# Patient Record
Sex: Female | Born: 2000 | Hispanic: No | Marital: Single | Smoking: Never smoker
Health system: Southern US, Community
[De-identification: ages and names within clinical notes are randomized; demographics above are authoritative.]

## PROBLEM LIST (undated history)

## (undated) ENCOUNTER — Emergency Department (HOSPITAL_COMMUNITY): Admission: EM | Payer: Self-pay | Source: Home / Self Care

## (undated) DIAGNOSIS — D649 Anemia, unspecified: Secondary | ICD-10-CM

## (undated) DIAGNOSIS — E079 Disorder of thyroid, unspecified: Secondary | ICD-10-CM

## (undated) DIAGNOSIS — M1990 Unspecified osteoarthritis, unspecified site: Secondary | ICD-10-CM

## (undated) DIAGNOSIS — G510 Bell's palsy: Secondary | ICD-10-CM

## (undated) DIAGNOSIS — E039 Hypothyroidism, unspecified: Secondary | ICD-10-CM

## (undated) DIAGNOSIS — M069 Rheumatoid arthritis, unspecified: Secondary | ICD-10-CM

## (undated) HISTORY — DX: Bell's palsy: G51.0

## (undated) HISTORY — DX: Hypothyroidism, unspecified: E03.9

## (undated) HISTORY — DX: Unspecified osteoarthritis, unspecified site: M19.90

## (undated) HISTORY — DX: Anemia, unspecified: D64.9

## (undated) HISTORY — DX: Disorder of thyroid, unspecified: E07.9

---
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
## 2019-02-06 ENCOUNTER — Telehealth: Payer: Self-pay | Admitting: Internal Medicine

## 2019-02-06 ENCOUNTER — Other Ambulatory Visit (INDEPENDENT_AMBULATORY_CARE_PROVIDER_SITE_OTHER): Payer: Self-pay

## 2019-02-06 ENCOUNTER — Other Ambulatory Visit: Payer: Self-pay

## 2019-02-06 DIAGNOSIS — E039 Hypothyroidism, unspecified: Secondary | ICD-10-CM

## 2019-02-06 LAB — T4, FREE: Free T4: 1.38 ng/dL (ref 0.60–1.60)

## 2019-02-06 LAB — TSH: TSH: 5.88 u[IU]/mL — ABNORMAL HIGH (ref 0.40–5.00)

## 2019-02-06 MED ORDER — LEVOTHYROXINE SODIUM 75 MCG PO TABS: 75.0000 ug | ORAL_TABLET | Freq: Every day | ORAL | 3 refills | Status: DC

## 2019-02-06 NOTE — Telephone Encounter (Signed)
Please let her know her thyroid is off again, I have increased her levothyroxine from 50 to 75 mcg daily    Prescription should be at the pharmacy.     Thanks    Abby Nena Jordan, MD  [facility] Endocrinology  [facility] [address] [place], [place] [address] Phone: [phone] FAX: [phone]

## 2019-02-06 NOTE — Telephone Encounter (Signed)
Pt aware of results 

## 2019-06-12 ENCOUNTER — Ambulatory Visit: Payer: Self-pay | Admitting: Internal Medicine

## 2019-08-07 ENCOUNTER — Encounter: Payer: Self-pay | Admitting: Internal Medicine

## 2019-08-07 ENCOUNTER — Ambulatory Visit (INDEPENDENT_AMBULATORY_CARE_PROVIDER_SITE_OTHER): Payer: Self-pay | Admitting: Internal Medicine

## 2019-08-07 ENCOUNTER — Other Ambulatory Visit: Payer: Self-pay

## 2019-08-07 VITALS — BP 108/72 | HR 73 | Temp 98.7°F | Ht 64.0 in | Wt 122.6 lb

## 2019-08-07 DIAGNOSIS — L819 Disorder of pigmentation, unspecified: Secondary | ICD-10-CM

## 2019-08-07 DIAGNOSIS — E063 Autoimmune thyroiditis: Secondary | ICD-10-CM | POA: Insufficient documentation

## 2019-08-07 LAB — T4, FREE: Free T4: 1.01 ng/dL (ref 0.60–1.60)

## 2019-08-07 LAB — TSH: TSH: 1.3 u[IU]/mL (ref 0.35–5.50)

## 2019-08-07 NOTE — Patient Instructions (Signed)

## 2019-08-07 NOTE — Progress Notes (Signed)
Name: Jessica Frazier  MRN/ DOB: [id number], 01-Jul-2000    Age/ Sex: 19 y.o., female     PCP: Karel Jarvis, NP   Reason for Endocrinology Evaluation: Hypothyroidism     Initial Endocrinology Clinic Visit: 12/12/2019    PATIENT IDENTIFIER: Jessica Frazier is a 19 y.o., female with a past medical history of hypothyroidism and RA . She has followed with [facility] [facility] since 12/12/2019 for consultative assistance with management of her hypothyroidism.   HISTORICAL SUMMARY:  Pt was noted to have an elevated TSH at 9.7 uIU/mL during an evaluation for severe joint pains and aches in 11/2018 She was started on Lt-4 replacement at the time.   Mother with hyperthyroidism  SUBJECTIVE:    Today (08/07/2019):  Jessica Frazier is here for a follow up on hypothyroidism.  She has been compliant with LT-4 replacement.  She has noted weight loss Denies fatigue or constipation She was recently started on RINVOQ for RA through rheumatology by Dr. Marella Chimes    Has noted pruritic rash that was noted ~ 6 months ago, followed by hypopigmentation  No local neck symptoms       ROS:  As per HPI.   HISTORY:  Past Medical History:  Past Medical History:  Diagnosis Date  . Bell's palsy   . Hypothyroidism     Past Surgical History: No past surgical history on file.  Social History:  reports that she has never smoked. She has never used smokeless tobacco. She reports that she does not use drugs. No history on file for alcohol. Family History:  Family History  Problem Relation Age of Onset  . Graves' disease Mother   . Healthy Father      HOME MEDICATIONS: Allergies as of 08/07/2019   No Known Allergies     Medication List       Accurate as of Aug 07, 2019  2:10 PM. If you have any questions, ask your nurse or doctor.        amoxicillin 500 MG capsule Commonly known as: AMOXIL TAKE 1 CAPUSLE THREE TIMES DAILY.   ibuprofen 600 MG tablet Commonly known as:  ADVIL Take 600 mg by mouth 2 (two) times daily as needed.   levothyroxine 75 MCG tablet Commonly known as: SYNTHROID Take 1 tablet (75 mcg total) by mouth daily.   predniSONE 10 MG tablet Commonly known as: DELTASONE PLEASE SEE ATTACHED FOR DETAILED DIRECTIONS   Rinvoq 15 MG Tb24 Generic drug: Upadacitinib ER Take by mouth.   valACYclovir 500 MG tablet Commonly known as: VALTREX TAKE 1 TABLET BY MOUTH TWICE A DAY FOR 5 DAYS         OBJECTIVE:   PHYSICAL EXAM: VS: BP 108/72 (BP Location: Left Arm, Patient Position: Sitting, Cuff Size: Normal)   Pulse 73   Temp 98.7 F (37.1 C)   Ht 5\' 4"  (1.626 m)   Wt 122 lb 9.6 oz (55.6 kg)   LMP 07/06/2019 (Exact Date)   SpO2 98%   BMI 21.04 kg/m    EXAM: General: Pt appears well and is in NAD  Neck: General: Supple without adenopathy. Thyroid: Thyroid size is prominent.   Lungs: Clear with good BS bilat with no rales, rhonchi, or wheezes  Heart: Auscultation: RRR.  Abdomen: Normoactive bowel sounds, soft, nontender, without masses or organomegaly palpable  Extremities:  BL LE: No pretibial edema normal ROM and strength.  Mental Status: Judgment, insight: Intact Orientation: Oriented to time, place, and person Mood and affect:  No depression, anxiety, or agitation     DATA REVIEWED: Results for LUWAM, CURRIE (MRN [id number]) as of 08/08/2019 07:10  Ref. Range 08/07/2019 14:20  TSH Latest Ref Range: 0.35 - 5.50 uIU/mL 1.30  T4,Free(Direct) Latest Ref Range: 0.60 - 1.60 ng/dL 1.01      ASSESSMENT / PLAN / RECOMMENDATIONS:   1. 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 Tinea Versicolor:   - I have advised her to consult dermatology - Reassurance provided at this time     F/U in 6 months   Signed electronically by: Mack Guise,  MD  [facility] Endocrinology  [facility] [address] [place], [place] [address] Phone: [phone] FAX: [phone]      CC: Karel Jarvis, NP [address] [place] [address] Phone: [phone]  Fax: [phone]   Return to Endocrinology clinic as below: No future appointments.

## 2019-08-08 ENCOUNTER — Encounter: Payer: Self-pay | Admitting: Internal Medicine

## 2019-08-08 MED ORDER — LEVOTHYROXINE SODIUM 75 MCG PO TABS: 75.0000 ug | ORAL_TABLET | Freq: Every day | ORAL | 3 refills | Status: DC

## 2020-02-04 ENCOUNTER — Encounter: Payer: Self-pay | Admitting: Internal Medicine

## 2020-02-04 ENCOUNTER — Other Ambulatory Visit: Payer: Self-pay

## 2020-02-04 ENCOUNTER — Ambulatory Visit (INDEPENDENT_AMBULATORY_CARE_PROVIDER_SITE_OTHER): Payer: Self-pay | Admitting: Internal Medicine

## 2020-02-04 VITALS — BP 106/70 | HR 80 | Ht 64.0 in | Wt 123.5 lb

## 2020-02-04 DIAGNOSIS — E063 Autoimmune thyroiditis: Secondary | ICD-10-CM

## 2020-02-04 LAB — TSH: TSH: 1.47 u[IU]/mL (ref 0.35–5.50)

## 2020-02-04 NOTE — Progress Notes (Signed)
Name: Jessica Frazier  MRN/ DOB: [id number], [birth year]    Age/ Sex: 19 y.o., female     PCP: Karel Jarvis, NP   Reason for Endocrinology Evaluation: Hypothyroidism     Initial Endocrinology Clinic Visit: 12/12/2019    PATIENT IDENTIFIER: [name] is a 19 y.o., female with a past medical history of hypothyroidism and RA . She has followed with [facility] since 12/12/2019 for consultative assistance with management of her hypothyroidism.   HISTORICAL SUMMARY:  Pt was noted to have an elevated TSH at 9.7 uIU/mL during an evaluation for severe joint pains and aches in 11/2018 She was started on Lt-4 replacement at the time.   Mother with hyperthyroidism  SUBJECTIVE:    Today (02/04/2020):  [name] is here for a follow up on hypothyroidism.  She has been compliant with LT-4 replacement.  Weight has been stable  Denies fatigue or constipation Denies depression    Denies local neck symptoms    She is on  RINVOQ for RA through rheumatology by Dr. Marella Chimes     HISTORY:  Past Medical History:  Past Medical History:  Diagnosis Date  . Bell's palsy   . Hypothyroidism     Past Surgical History: No past surgical history on file.  Social History:  reports that she has never smoked. She has never used smokeless tobacco. She reports that she does not use drugs. No history on file for alcohol use. Family History:  Family History  Problem Relation Age of Onset  . Graves' disease Mother   . Healthy Father      HOME MEDICATIONS: Allergies as of 02/04/2020   No Known Allergies     Medication List       Accurate as of February 04, 2020  1:23 PM. If you have any questions, ask your nurse or doctor.        STOP taking these medications   amoxicillin 500 MG capsule Commonly known as: AMOXIL Stopped by: Dorita Sciara, MD   predniSONE 10 MG tablet Commonly known as: DELTASONE Stopped by: Dorita Sciara, MD     TAKE these  medications   ibuprofen 600 MG tablet Commonly known as: ADVIL Take 600 mg by mouth 2 (two) times daily as needed.   levothyroxine 75 MCG tablet Commonly known as: SYNTHROID Take 1 tablet (75 mcg total) by mouth daily.   Rinvoq 15 MG Tb24 Generic drug: Upadacitinib ER Take by mouth.   valACYclovir 500 MG tablet Commonly known as: VALTREX TAKE 1 TABLET BY MOUTH TWICE A DAY FOR 5 DAYS         OBJECTIVE:   PHYSICAL EXAM: VS: BP 106/70   Pulse 80   Ht 5\' 4"  (1.626 m)   Wt 123 lb 8 oz (56 kg)   LMP 01/30/2020   SpO2 97%   BMI 21.20 kg/m    EXAM: General: Pt appears well and is in NAD  Neck: General: Supple without adenopathy. Thyroid: Thyroid size is prominent.   Lungs: Clear with good BS bilat with no rales, rhonchi, or wheezes  Heart: Auscultation: RRR.  Abdomen: Normoactive bowel sounds, soft, nontender, without masses or organomegaly palpable  Extremities:  BL LE: No pretibial edema normal ROM and strength.  Mental Status: Judgment, insight: Intact Orientation: Oriented to time, place, and person Mood and affect: No depression, anxiety, or agitation     DATA REVIEWED: Results for TOMA, ERICHSEN (MRN [id number]) as of 02/05/2020 09:29  Ref. Range 02/04/2020  13:34  TSH Latest Ref Range: 0.35 - 5.50 uIU/mL 1.47     ASSESSMENT / PLAN / RECOMMENDATIONS:   1. Hypothyroidism Secondary to Hashimoto's Thyroiditis :  - Pt is clinically euthyroid  - No local neck symptoms - Repeat labs today show normal TSH   Medications   Continue Levothyroxine 75 mcg daily      F/U in 1 yr   Signed electronically by: Mack Guise, MD  [facility] Endocrin[facility]  [facility] [address] [place], [place] [address] Phone: [phone] FAX: [phone]      CC: Karel Jarvis, NP [place] [address] Phone: [phone]  Fax: [phone]   Return to Endocrinology clinic as below: No future  appointments.

## 2020-02-04 NOTE — Patient Instructions (Signed)
-   Continue Levothyroxine 75 mcg daily

## 2020-02-05 ENCOUNTER — Encounter: Payer: Self-pay | Admitting: Internal Medicine

## 2020-02-08 ENCOUNTER — Ambulatory Visit: Payer: Self-pay | Admitting: Internal Medicine

## 2020-04-09 ENCOUNTER — Encounter (HOSPITAL_COMMUNITY): Payer: Self-pay | Admitting: Emergency Medicine

## 2020-04-09 ENCOUNTER — Other Ambulatory Visit: Payer: Self-pay

## 2020-04-09 ENCOUNTER — Observation Stay (HOSPITAL_COMMUNITY)
Admission: EM | Admit: 2020-04-09 | Discharge: 2020-04-10 | Disposition: A | Discharge status: Home / Self Care | Payer: Self-pay | Attending: Family Medicine | Admitting: Family Medicine

## 2020-04-09 ENCOUNTER — Emergency Department (HOSPITAL_COMMUNITY): Payer: Self-pay

## 2020-04-09 DIAGNOSIS — R945 Abnormal results of liver function studies: Principal | ICD-10-CM | POA: Insufficient documentation

## 2020-04-09 DIAGNOSIS — D61818 Other pancytopenia: Secondary | ICD-10-CM

## 2020-04-09 DIAGNOSIS — Z20822 Contact with and (suspected) exposure to covid-19: Secondary | ICD-10-CM | POA: Insufficient documentation

## 2020-04-09 DIAGNOSIS — R7401 Elevation of levels of liver transaminase levels: Secondary | ICD-10-CM | POA: Insufficient documentation

## 2020-04-09 DIAGNOSIS — R112 Nausea with vomiting, unspecified: Secondary | ICD-10-CM

## 2020-04-09 DIAGNOSIS — R7989 Other specified abnormal findings of blood chemistry: Secondary | ICD-10-CM

## 2020-04-09 DIAGNOSIS — E039 Hypothyroidism, unspecified: Secondary | ICD-10-CM | POA: Insufficient documentation

## 2020-04-09 DIAGNOSIS — Z79899 Other long term (current) drug therapy: Secondary | ICD-10-CM | POA: Insufficient documentation

## 2020-04-09 DIAGNOSIS — R1011 Right upper quadrant pain: Secondary | ICD-10-CM

## 2020-04-09 DIAGNOSIS — M069 Rheumatoid arthritis, unspecified: Secondary | ICD-10-CM

## 2020-04-09 HISTORY — DX: Rheumatoid arthritis, unspecified: M06.9

## 2020-04-09 LAB — COMPREHENSIVE METABOLIC PANEL
ALT: 284 U/L — ABNORMAL HIGH (ref 0–44)
AST: 1069 U/L — ABNORMAL HIGH (ref 15–41)
Albumin: 2.5 g/dL — ABNORMAL LOW (ref 3.5–5.0)
Alkaline Phosphatase: 185 U/L — ABNORMAL HIGH (ref 38–126)
Anion gap: 9 (ref 5–15)
BUN: <5 mg/dL — ABNORMAL LOW (ref 6–20)
CO2: 25 mmol/L (ref 22–32)
Calcium: 8.1 mg/dL — ABNORMAL LOW (ref 8.9–10.3)
Chloride: 105 mmol/L (ref 98–111)
Creatinine, Ser: 0.54 mg/dL (ref 0.44–1.00)
GFR, Estimated: >60 mL/min (ref >60)
Glucose, Bld: 99 mg/dL (ref 70–99)
Potassium: 3.8 mmol/L (ref 3.5–5.1)
Sodium: 139 mmol/L (ref 135–145)
Total Bilirubin: 1.3 mg/dL — ABNORMAL HIGH (ref 0.3–1.2)
Total Protein: 7.1 g/dL (ref 6.5–8.1)

## 2020-04-09 LAB — I-STAT BETA HCG BLOOD, ED (MC, WL, AP ONLY): I-stat hCG, quantitative: <5 m[IU]/mL (ref <5)

## 2020-04-09 LAB — PROTIME-INR
INR: 1.1 (ref 0.8–1.2)
Prothrombin Time: 13.3 seconds (ref 11.4–15.2)

## 2020-04-09 LAB — HEPATITIS PANEL, ACUTE
HCV Ab: NONREACTIVE
Hep A IgM: NONREACTIVE
Hep B C IgM: NONREACTIVE
Hepatitis B Surface Ag: NONREACTIVE

## 2020-04-09 LAB — SARS CORONAVIRUS 2 (TAT 6-24 HRS): SARS Coronavirus 2: NEGATIVE

## 2020-04-09 LAB — HIV ANTIBODY (ROUTINE TESTING W REFLEX): HIV Screen 4th Generation wRfx: NONREACTIVE

## 2020-04-09 LAB — CBC
HCT: 33.4 % — ABNORMAL LOW (ref 36.0–46.0)
Hemoglobin: 10.1 g/dL — ABNORMAL LOW (ref 12.0–15.0)
MCH: 28.1 pg (ref 26.0–34.0)
MCHC: 30.2 g/dL (ref 30.0–36.0)
MCV: 92.8 fL (ref 80.0–100.0)
Platelets: 94 10*3/uL — ABNORMAL LOW (ref 150–400)
RBC: 3.6 MIL/uL — ABNORMAL LOW (ref 3.87–5.11)
RDW: 18.7 % — ABNORMAL HIGH (ref 11.5–15.5)
WBC: 2.3 10*3/uL — ABNORMAL LOW (ref 4.0–10.5)
nRBC: 0 % (ref 0.0–0.2)

## 2020-04-09 LAB — ACETAMINOPHEN LEVEL: Acetaminophen (Tylenol), Serum: <10 ug/mL — ABNORMAL LOW (ref 10–30)

## 2020-04-09 LAB — ETHANOL: Alcohol, Ethyl (B): <10 mg/dL (ref <10)

## 2020-04-09 LAB — APTT: aPTT: 38 seconds — ABNORMAL HIGH (ref 24–36)

## 2020-04-09 LAB — HEPATITIS B SURFACE ANTIGEN: Hepatitis B Surface Ag: NONREACTIVE

## 2020-04-09 LAB — LIPASE, BLOOD: Lipase: 40 U/L (ref 11–51)

## 2020-04-09 MED ORDER — ACETAMINOPHEN 650 MG RE SUPP: 650.0000 mg | Freq: Four times a day (QID) | RECTAL | Status: DC | PRN

## 2020-04-09 MED ORDER — MORPHINE SULFATE (PF) 4 MG/ML IV SOLN: 4.0000 mg | Freq: Once | INTRAVENOUS | Status: DC

## 2020-04-09 MED ORDER — MORPHINE SULFATE (PF) 2 MG/ML IV SOLN
2.0000 mg | Freq: Once | INTRAVENOUS | Status: AC
  Administered 2020-04-09: 2 mg via INTRAVENOUS
  Filled 2020-04-09: qty 1

## 2020-04-09 MED ORDER — ACETAMINOPHEN 325 MG PO TABS: 650.0000 mg | ORAL_TABLET | Freq: Four times a day (QID) | ORAL | Status: DC | PRN

## 2020-04-09 MED ORDER — ENOXAPARIN SODIUM 40 MG/0.4ML ~~LOC~~ SOLN
40.0000 mg | SUBCUTANEOUS | Status: DC
  Filled 2020-04-09 (×2): qty 0.4

## 2020-04-09 MED ORDER — SODIUM CHLORIDE 0.9 % IV BOLUS
500.0000 mL | Freq: Once | INTRAVENOUS | Status: AC
  Administered 2020-04-09: 500 mL via INTRAVENOUS

## 2020-04-09 MED ORDER — FENTANYL CITRATE (PF) 100 MCG/2ML IJ SOLN
50.0000 ug | Freq: Once | INTRAMUSCULAR | Status: AC
  Administered 2020-04-09: 50 ug via INTRAVENOUS
  Filled 2020-04-09: qty 2

## 2020-04-09 MED ORDER — LEVOTHYROXINE SODIUM 75 MCG PO TABS
75.0000 ug | ORAL_TABLET | Freq: Every day | ORAL | Status: DC
  Administered 2020-04-09 – 2020-04-10 (×2): 75 ug via ORAL
  Filled 2020-04-09 (×2): qty 1

## 2020-04-09 NOTE — H&P (Signed)
[facility] Admission History and Physical Service Pager: [phone]  Patient name: Jessica Frazier Medical record number: [id number] Date of birth: 2001/01/14 Age: 20 y.o. Gender: female  Primary Care Provider: Karel Jarvis, NP Consultants: None Code Status: Full Preferred Emergency Contact: Mother [name]  Chief Complaint: Elevated transaminases  Assessment and Plan: Jessica Frazier is a 20 y.o. female presenting with elevated LFTs. PMH is significant for rheumatoid arthritis, hypothyroidism.  Elevated Transaminases  RUQ pain Sent over from Rheumatologists Office after noted elevated LFTs.  Patient's home medications do include Rinvoq which does have an adverse effect of elevated LFTs.  Per report the patient had seen her rheumatologist recently and was recommended to stop that medication due to mildly elevated LFTs, however she apparently continued with it.  She was seen in her rheumatologist office a few days ago and had additional blood work drawn which showed elevated transaminases and so she was sent to the emergency department once this lab resulted.  In the emergency department patient had an AST elevated at 1069, ALT of 284, total bilirubin mildly elevated 1.3.  Right upper quadrant ultrasound showed coarse echogenic liver suggesting fatty liver disease and possible trace perihepatic ascites but was otherwise normal. Patient does endorse some mild right upper quadrant discomfort to palpation.  Low concern for gallbladder etiology with the right upper quadrant ultrasound findings and bilirubin essentially within normal limits.  She also had a hCG that was negative, no concern for ectopic pregnancy causing her abdominal pain. Overall differential for the elevated transaminases can include her rheumatoid medication which does have this as a potential adverse effect versus acetaminophen overdose, alcohol, other toxins, viral infections including hepatitis A, B,  C. -Admit to FPTS, MedSurg, attending Dr. Gwendlyn Deutscher -A.m. CBC/CMP -Check for HIV -We will check acetaminophen level, ethanol level -Hepatitis panel including hepatitis B surface antigen -We will check PT/INR and PTT to evaluate for liver function -Can consider oxycodone 5 mg for pain, though patient does not endorse much discomfort at this time  Pancytopenia: Patient with white blood cell count decreased at 2.3, hemoglobin 10.1, platelets of 94 on admission today.  Per talking to her rheumatology office patient had previous labs drawn in December on the 28th which showed white blood cell count of 3.3, hemoglobin of 10, platelets within normal limits at 307.  She also had previous labs in June 2021 which showed white blood cell count barely normal limits at 4.3, hemoglobin barely below normal at 11.4, and platelets within normal limits.  Per the adverse effects of the rinvoq, anemia and lymphopenia can occur.  I do not find any mention of thrombocytopenia as a potential adverse effect for this medication upon review.  Other differentials can include myelodysplastic syndrome, aplastic anemia, hypersplenism, certain infections such as HIV, hepatitis, parvo B19 infection.  On physical exam the patient does not have a enlarged spleen, she does not endorse recent symptoms of cough, fever, but does endorse some shortness of breath over the recent time.  No obvious signs of a recent viral respiratory infection. -A.m. CBC with differential -Viral hepatitis panel as above -We will check HIV  Hypothyroidism: Last TSH 02/04/2020 within normal is at 1.47.  Home medications include Synthroid 75 mcg/day. - Continue home Synthroid.  Rheumatoid arthritis Home medications include Upadacitinib ER 15mg  daily.  Per her rheumatology office this medication was discontinued at her office visit 1-2 days ago. -Continue to hold  FEN/GI: Regular Prophylaxis: Lovenox  Disposition: Admit to MedSurg  History of  Present  Illness:  Jessica Frazier is a 20 y.o. female presenting after receiving a call from her rheumatology office that she should go and be evaluated due to greatly elevated transaminases.  Patient has a history of rheumatoid arthritis and has been taking a medication called Rinvoq about a year.  Per the report she had previously been recommended to stop this medication after getting a mild bump in her AST/ALT, however she continued taking this and after having a visit at her rheumatologist office 1 to 2 days ago and having labs drawn she received a phone call that these labs had resulted and her AST was greatly elevated.  She was recommended to go to the emergency department to be evaluated.  She did complain of some right upper quadrant abdominal pain and had an ultrasound in the emergency department that did not show any obvious signs of gallbladder etiology for the cause of her symptoms.  She had stopped taking the rheumatoid arthritis medication as of yesterday.  Her only other medication is Synthroid which she takes for hypothyroidism.  She denies any tobacco use, only drinks alcohol on rare occasion such as holidays, and does not use any illicit drugs.  Review Of Systems: Per HPI with the following additions:   Review of Systems  Constitutional: Negative for chills and fever.  Respiratory: Positive for shortness of breath (occasional).   Cardiovascular: Negative for chest pain.  Gastrointestinal: Positive for abdominal pain. Negative for constipation and diarrhea.  Genitourinary: Negative for menstrual problem.  Neurological: Positive for headaches.     Patient Active Problem List   Diagnosis Date Noted  . Hashimoto's thyroiditis 08/07/2019  . Discoloration of skin 08/07/2019  . 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 Acquired hypothyroidism 12/12/2018    Past Medical History: Past Medical History:  Diagnosis Date  . Bell's palsy   . Hypothyroidism   . Rheumatoid arthritis ([place])     Past Surgical  History: History reviewed. No pertinent surgical history.  Social History: Social History   Tobacco Use  . Smoking status: Never Smoker  . Smokeless tobacco: Never Used  Substance Use Topics  . Drug use: Never   Additional social history:   Please also refer to relevant sections of EMR.  Family History: Family History  Problem Relation Age of Onset  . Graves' disease Mother   . Healthy Father    Allergies and Medications: No Known Allergies No current facility-administered medications on file prior to encounter.   Current Outpatient Medications on File Prior to Encounter  Medication Sig Dispense Refill  . ibuprofen (ADVIL) 600 MG tablet Take 600 mg by mouth 2 (two) times daily as needed.    Marland Kitchen levothyroxine (SYNTHROID) 75 MCG tablet Take 1 tablet (75 mcg total) by mouth daily. 90 tablet 3  . Upadacitinib ER (RINVOQ) 15 MG TB24 Take by mouth.    . valACYclovir (VALTREX) 500 MG tablet TAKE 1 TABLET BY MOUTH TWICE A DAY FOR 5 DAYS (Patient not taking: Reported on 02/04/2020)      Objective: BP 101/66 (BP Location: Left Arm)   Pulse 95   Temp 98.6 F (37 C) (Oral)   Resp 14   Ht 5\' 2"  (1.575 m)   Wt 52.2 kg   SpO2 99%   BMI 21.03 kg/m  Exam: General: Alert and oriented, no apparent distress  Eyes: PERRLA, no scleral icterus Cardiovascular: RRR with no murmurs noted Respiratory: CTA bilaterally  Gastrointestinal: Bowel sounds present.  Mild abdominal discomfort to palpation of the right upper  quadrant, negative Murphy sign, no obvious hepatosplenomegaly on physical exam, no pain with palpation of the left upper quadrant MSK: Upper extremity strength 5/5 bilaterally, Lower extremity strength 5/5 bilaterally  Derm: No rashes noted Neuro: No obvious focal deficits Psych: Behavior and speech appropriate to situation  Labs and Imaging: CBC BMET  Recent Labs  Lab 04/09/20 0856  WBC 2.3*  HGB 10.1*  HCT 33.4*  PLT 94*   Recent Labs  Lab 04/09/20 0856  NA 139  K  3.8  CL 105  CO2 25  BUN <5*  CREATININE 0.54  GLUCOSE 99  CALCIUM 8.1Lurline Del, DO 04/09/2020, 1:34 PM PGY-2, [place] Intern pager: [phone], text pages welcome

## 2020-04-09 NOTE — ED Provider Notes (Signed)
[place] EMERGENCY DEPARTMENT Provider Note   CSN: [id number] Arrival date & time: 04/09/20  0840     History Chief Complaint  Patient presents with  . Abnormal Lab    Jessica Frazier is a 20 y.o. female history Bell palsy, hypothyroidism, rheumatoid arthritis.  Patient arrives today sent in by her rheumatologist for evaluation of elevated AST.  Patient reports that she currently takes Rinvoq and they have been monitoring her LFTs, she has had increasing elevation of the past month as well as right upper quadrant pain nausea and vomiting for the past few days.  She denies similar problem in the past.  She is in for further evaluation.  She reports abdominal pain as an aching constant nonradiating pain no aggravating or alleviating factors moderate intensity.  Denies fever/chills, chest pain/shortness of breath, cough/hemoptysis, dysuria/hematuria, vaginal bleeding/discharge or any additional concerns HPI     Past Medical History:  Diagnosis Date  . Bell's palsy   . Hypothyroidism   . Rheumatoid arthritis [facility])     Patient Active Problem List   Diagnosis Date Noted  . Elevated LFTs 04/09/2020  . Hashimoto's thyroiditis 08/07/2019  . Discoloration of skin 08/07/2019  . 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 Acquired hypothyroidism 12/12/2018    History reviewed. No pertinent surgical history.   OB History   No obstetric history on file.     Family History  Problem Relation Age of Onset  . Graves' disease Mother   . Healthy Father     Social History   Tobacco Use  . Smoking status: Never Smoker  . Smokeless tobacco: Never Used  Substance Use Topics  . Drug use: Never    Home Medications Prior to Admission medications   Medication Sig Start Date End Date Taking? Authorizing Provider  ibuprofen (ADVIL) 600 MG tablet Take 600 mg by mouth 2 (two) times daily as needed. 11/23/18   [provider]  levothyroxine (SYNTHROID) 75 MCG tablet Take 1  tablet (75 mcg total) by mouth daily. 08/08/19   Shamleffer, Melanie Crazier, MD  Upadacitinib ER (RINVOQ) 15 MG TB24 Take by mouth.    [provider]  valACYclovir (VALTREX) 500 MG tablet TAKE 1 TABLET BY MOUTH TWICE A DAY FOR 5 DAYS Patient not taking: Reported on 02/04/2020 08/28/18   [provider]    Allergies    Patient has no known allergies.  Review of Systems   Review of Systems Ten systems are reviewed and are negative for acute change except as noted in the HPI  Physical Exam Updated Vital Signs BP 101/66 (BP Location: Left Arm)   Pulse 95   Temp 98.6 F (37 C) (Oral)   Resp 14   Ht 5' 2"  (1.575 m)   Wt 52.2 kg   SpO2 99%   BMI 21.03 kg/m   Physical Exam Constitutional:      General: She is not in acute distress.    Appearance: Normal appearance. She is well-developed. She is not ill-appearing or diaphoretic.  HENT:     Head: Normocephalic and atraumatic.  Eyes:     General: Vision grossly intact. Gaze aligned appropriately.     Pupils: Pupils are equal, round, and reactive to light.  Neck:     Trachea: Trachea and phonation normal.  Pulmonary:     Effort: Pulmonary effort is normal. No respiratory distress.  Abdominal:     General: There is no distension.     Palpations: Abdomen is soft.  Tenderness: There is abdominal tenderness in the right upper quadrant. There is no guarding or rebound. Negative signs include Murphy's sign and McBurney's sign.  Musculoskeletal:        General: Normal range of motion.     Cervical back: Normal range of motion.  Skin:    General: Skin is warm and dry.  Neurological:     Mental Status: She is alert.     GCS: GCS eye subscore is 4. GCS verbal subscore is 5. GCS motor subscore is 6.     Comments: Speech is clear and goal oriented, follows commands Major Cranial nerves without deficit, no facial droop Moves extremities without ataxia, coordination intact  Psychiatric:        Behavior: Behavior  normal.     ED Results / Procedures / Treatments   Labs (all labs ordered are listed, but only abnormal results are displayed) Labs Reviewed  COMPREHENSIVE METABOLIC PANEL - Abnormal; Notable for the following components:      Result Value   BUN <5 (*)    Calcium 8.1 (*)    Albumin 2.5 (*)    AST 1,069 (*)    ALT 284 (*)    Alkaline Phosphatase 185 (*)    Total Bilirubin 1.3 (*)    All other components within normal limits  CBC - Abnormal; Notable for the following components:   WBC 2.3 (*)    RBC 3.60 (*)    Hemoglobin 10.1 (*)    HCT 33.4 (*)    RDW 18.7 (*)    Platelets 94 (*)    All other components within normal limits  SARS CORONAVIRUS 2 (TAT 6-24 HRS)  LIPASE, BLOOD  URINALYSIS, ROUTINE W REFLEX MICROSCOPIC  I-STAT BETA HCG BLOOD, ED ([facility], [facility], [facility] ONLY)    EKG None  Radiology US Abdomen Limited RUQ (LIVER/GB)  Result Date: 04/09/2020 CLINICAL DATA:  20 year old female with right upper quadrant pain for 2 days, abnormal LFTs. EXAM: ULTRASOUND ABDOMEN LIMITED RIGHT UPPER QUADRANT COMPARISON:  None. FINDINGS: Gallbladder: No gallstones or wall thickening visualized. No sonographic Murphy sign noted by sonographer. Common bile duct: Diameter: 3 mm, normal. Liver: Coarse hepatic echotexture (image 25), pronounced increased echogenicity relative to the right kidney (image 44). No discrete liver lesion. No intrahepatic biliary ductal dilatation identified. Portal vein is patent on color Doppler imaging with normal direction of blood flow towards the liver. Other: Negative visible right kidney. Trace perihepatic free fluid is possible (image 39). IMPRESSION: 1. Coarse, echogenic liver suggesting Fatty Liver disease. Possible trace perihepatic ascites. 2. But otherwise negative right upper quadrant ultrasound: Negative gallbladder and no evidence of bile duct obstruction. Electronically Signed   By: Genevie Ann M.D.   On: 04/09/2020 11:18    Procedures Procedures   Medications  Ordered in ED Medications  sodium chloride 0.9 % bolus 500 mL (0 mLs Intravenous Stopped 04/09/20 1252)  fentaNYL (SUBLIMAZE) injection 50 mcg (50 mcg Intravenous Given 04/09/20 1026)  morphine 2 MG/ML injection 2 mg (2 mg Intravenous Given 04/09/20 1320)    ED Course  I have reviewed the triage vital signs and the nursing notes.  Pertinent labs & imaging results that were available during my care of the patient were reviewed by me and considered in my medical decision making (see chart for details).  Clinical Course as of 04/09/20 1416  Wed Apr 09, 2020  [facility] [BM]  3500 Prednisone 5-10 mg a day [BM]  1340 Dr. Manus Rudd [BM]  Clinical Course User Index [BM] Gari Crown   MDM Rules/Calculators/A&P                         Additional history obtained from: 1. Nursing notes from this visit. 2. Patient's Rheumatology office. ----------------------------------- I ordered, reviewed and interpreted labs which include: CBC shows pancytopenia CMP shows significant elevation of AST, additionally elevation of ALT and alk phos.  No emergent electrolyte derangement AKI or gap Lipase normal limits. Pregnancy test negative.  RUQ [place]:  IMPRESSION:  1. Coarse, echogenic liver suggesting Fatty Liver disease. Possible  trace perihepatic ascites.  2. But otherwise negative right upper quadrant ultrasound: Negative  gallbladder and no evidence of bile duct obstruction.  - Case discussed with patient's rheumatology provider Marella Chimes, PA-C.  Advises that significant elevation of AST is likely not due to Rinvoq as only mild elevations of 15% are seen with this medication.  They are concerned for other underlying pathologies.  They recommend patient stop taking Rinvoq and ask for medicine admission for further evaluation.  Advised patient begin taking prednisone 5-10 mg a day for treatment of rheumatoid arthritis in the meantime. - Patient reassessed some  improvement of pain after medication given in the ER.  She states understanding of findings and is agreeable to admission.  She has no additional complaints or concerns. - 1:40 PM: Consult with family medicine physician Dr. Manus Rudd, patient was accepted to medicine service.    Note: Portions of this report may have been transcribed using voice recognition software. Every effort was made to ensure accuracy; however, inadvertent computerized transcription errors may still be present. Final Clinical Impression(s) / ED Diagnoses Final diagnoses:  RUQ abdominal pain  LFT elevation  Pancytopenia (HCC)  Non-intractable vomiting with nausea, unspecified vomiting type  Rheumatoid arthritis, involving unspecified site, unspecified whether rheumatoid factor present [facility])    Rx / DC Orders ED Discharge Orders    None       Gari Crown 04/09/20 1417    Blanchie Dessert, MD 04/12/20 [phone]

## 2020-04-09 NOTE — ED Triage Notes (Signed)
Patient here for abnormal lab value, AST increased by approximately 1000 points from December to January. History of rheumatoid arthritis. Denies other complaints at this time.

## 2020-04-10 DIAGNOSIS — M069 Rheumatoid arthritis, unspecified: Secondary | ICD-10-CM

## 2020-04-10 LAB — CBC WITH DIFFERENTIAL/PLATELET
Abs Immature Granulocytes: 0.05 10*3/uL (ref 0.00–0.07)
Basophils Absolute: 0 10*3/uL (ref 0.0–0.1)
Basophils Relative: 0 %
Eosinophils Absolute: 0 10*3/uL (ref 0.0–0.5)
Eosinophils Relative: 0 %
HCT: 29.9 % — ABNORMAL LOW (ref 36.0–46.0)
Hemoglobin: 9.6 g/dL — ABNORMAL LOW (ref 12.0–15.0)
Immature Granulocytes: 2 %
Lymphocytes Relative: 16 %
Lymphs Abs: 0.4 10*3/uL — ABNORMAL LOW (ref 0.7–4.0)
MCH: 29.2 pg (ref 26.0–34.0)
MCHC: 32.1 g/dL (ref 30.0–36.0)
MCV: 90.9 fL (ref 80.0–100.0)
Monocytes Absolute: 0.1 10*3/uL (ref 0.1–1.0)
Monocytes Relative: 5 %
Neutro Abs: 2.1 10*3/uL (ref 1.7–7.7)
Neutrophils Relative %: 77 %
Platelets: 84 10*3/uL — ABNORMAL LOW (ref 150–400)
RBC: 3.29 MIL/uL — ABNORMAL LOW (ref 3.87–5.11)
RDW: 18.7 % — ABNORMAL HIGH (ref 11.5–15.5)
WBC: 2.7 10*3/uL — ABNORMAL LOW (ref 4.0–10.5)
nRBC: 0 % (ref 0.0–0.2)

## 2020-04-10 LAB — COMPREHENSIVE METABOLIC PANEL
ALT: 270 U/L — ABNORMAL HIGH (ref 0–44)
AST: 1004 U/L — ABNORMAL HIGH (ref 15–41)
Albumin: 2.6 g/dL — ABNORMAL LOW (ref 3.5–5.0)
Alkaline Phosphatase: 170 U/L — ABNORMAL HIGH (ref 38–126)
Anion gap: 8 (ref 5–15)
BUN: <5 mg/dL — ABNORMAL LOW (ref 6–20)
CO2: 25 mmol/L (ref 22–32)
Calcium: 8.1 mg/dL — ABNORMAL LOW (ref 8.9–10.3)
Chloride: 105 mmol/L (ref 98–111)
Creatinine, Ser: 0.61 mg/dL (ref 0.44–1.00)
GFR, Estimated: >60 mL/min (ref >60)
Glucose, Bld: 100 mg/dL — ABNORMAL HIGH (ref 70–99)
Potassium: 3.5 mmol/L (ref 3.5–5.1)
Sodium: 138 mmol/L (ref 135–145)
Total Bilirubin: 1.3 mg/dL — ABNORMAL HIGH (ref 0.3–1.2)
Total Protein: 7.1 g/dL (ref 6.5–8.1)

## 2020-04-10 LAB — MONONUCLEOSIS SCREEN: Mono Screen: NEGATIVE

## 2020-04-10 MED ORDER — ONDANSETRON 4 MG PO TBDP
4.0000 mg | ORAL_TABLET | Freq: Three times a day (TID) | ORAL | Status: DC | PRN
  Administered 2020-04-10: 4 mg via ORAL
  Filled 2020-04-10: qty 1

## 2020-04-10 MED ORDER — ONDANSETRON 4 MG PO TBDP: 4.0000 mg | ORAL_TABLET | Freq: Three times a day (TID) | ORAL | 0 refills | Status: AC | PRN

## 2020-04-10 NOTE — Discharge Instructions (Signed)
Dear Jessica Frazier,   Thank you for letting us participate in your care! In this section, you will find a brief hospital admission summary of why you were admitted to the hospital, what happened during your admission, your diagnosis/diagnoses, and recommended follow up.   You were admitted because you were experiencing elevated liver enzymes.   We believe this was due to you Jessica Frazier  You were treated with stopping your Jessica Frazier.   You were also tested for Hepatitis and other possible liver problems and we did not find any other causes.  At the time of your discharge we still had at least one lab pending which was looking for mono, when you follow-up with your primary care doctor if that results should be back.   POST-HOSPITAL & CARE INSTRUCTIONS 1. Please follow-up with your PCP Dr Michail Jewels next week and have her check your Liver enzymes.   2. Please let PCP/Specialists know of any changes in medications that were made.  3. Please see medications section of this packet for any medication changes.   DOCTOR'S APPOINTMENTS & FOLLOW UP Future Appointments  Date Time Provider [place]  02/02/2021  1:20 PM Shamleffer, Melanie Crazier, MD [facility] None     Thank you for choosing [facility]! Take care and be well!  [facility]  [place], [place] [address] [phone]

## 2020-04-10 NOTE — Hospital Course (Signed)
Jessica Frazier is a 20 y.o. female presenting with elevated LFTs. PMH is significant for rheumatoid arthritis, hypothyroidism.  Elevated Transaminases  RUQ pain Sent over from Rheumatology office given elevated Transaminases with AST over 1000.  On Admission AST was 1069 and ALT was 284.  Alk Phos also slightly elevated at 185.  Also had mild upper right quadrant pain with negative Murphy's sign.  Patient got significant lab work-up.  Hepatitis Panel , hCG, HIV, Monospot, Ethanol and Acetaminophen were all negative.  PT and aPTT were also normal.  RUQ was also obtained and showed liver inflammation, and some biliary sludge but no stone, blockage or cholecystitis.  Believe elevations were due to Rinvoq medication which patient had been taking for Rheumatoid Arthritis.  Stopped medication and did not restart at discharge.  LFT's slightly decreased at time of discharge and felt patient could be safely monitored outpatient.

## 2020-04-10 NOTE — Discharge Summary (Signed)
[facility] Discharge Summary  Patient name: Jessica Frazier Medical record number: [id number] Date of birth: 2001-03-01 Age: 20 y.o. Gender: female Date of Admission: 04/09/2020  Date of Discharge: 04/10/20 Admitting Physician: Kinnie Feil, MD  Primary Care Provider: Karel Jarvis, NP Consultants: None  Indication for Hospitalization: Elevated LFT's  Discharge Diagnoses/Problem List:  Rheumatoid Arthritis, Hypothyroidism  Disposition: Able to be discharged home safely  Discharge Condition: Stable  Discharge Exam:   Physical Exam Constitutional:      General: She is not in acute distress.    Appearance: She is not ill-appearing.  HENT:     Head: Normocephalic and atraumatic.     Mouth/Throat:     Mouth: Mucous membranes are moist.  Cardiovascular:     Rate and Rhythm: Normal rate and regular rhythm.     Pulses: Normal pulses.  Pulmonary:     Effort: Pulmonary effort is normal.     Breath sounds: Normal breath sounds.  Abdominal:     General: Abdomen is flat. Bowel sounds are normal. There is no distension.     Palpations: Abdomen is soft.     Tenderness: There is abdominal tenderness in the right upper quadrant. There is no guarding or rebound. Negative signs include Murphy's sign.  Neurological:     Mental Status: She is alert.     Brief Hospital Course:  Jessica Frazier is a 20 y.o. female presenting with elevated LFTs. PMH is significant for rheumatoid arthritis, hypothyroidism.  Elevated Transaminases  RUQ pain Sent over from Rheumatology office given elevated Transaminases with AST over 1000.  On Admission AST was 1069 and ALT was 284.  Alk Phos also slightly elevated at 185.  Also had mild upper right quadrant pain with negative Murphy's sign.  Patient got significant lab work-up.  Hepatitis Panel , hCG, HIV, Monospot, Ethanol and Acetaminophen were all negative.  PT and aPTT were also normal.  RUQ was also obtained and showed  liver inflammation, and some biliary sludge but no stone, blockage or cholecystitis.  Believe elevations were due to Rinvoq medication which patient had been taking for Rheumatoid Arthritis.  Stopped medication and did not restart at discharge.  LFT's slightly decreased at time of discharge and felt patient could be safely monitored outpatient.    Issues for Follow Up:  1. Obtain Liver function tests to see if patient's Transaminases have improved, can also obtain CBC to see if patient had improvement in mild pancytopenia. 2. Recommend work-up for patient's chronic nausea and vomiting. 3. Consider starting new medication for management of patient's RA.  Significant Procedures: None  Significant Labs and Imaging:  Recent Labs  Lab 04/09/20 0856 04/10/20 0149  WBC 2.3* 2.7*  HGB 10.1* 9.6*  HCT 33.4* 29.9*  PLT 94* 84*   Recent Labs  Lab 04/09/20 0856 04/10/20 0149  NA 139 138  K 3.8 3.5  CL 105 105  CO2 25 25  GLUCOSE 99 100*  BUN <5* <5*  CREATININE 0.54 0.61  CALCIUM 8.1* 8.1*  ALKPHOS 185* 170*  AST 1,069* 1,004*  ALT 284* 270*  ALBUMIN 2.5* 2.6*     Results/Tests Pending at Time of Discharge:  None  Discharge Medications:  Allergies as of 04/10/2020   No Known Allergies     Medication List    TAKE these medications   levothyroxine 75 MCG tablet Commonly known as: SYNTHROID Take 1 tablet (75 mcg total) by mouth daily.   ondansetron 4 MG disintegrating tablet Commonly  known as: ZOFRAN-ODT Take 1 tablet (4 mg total) by mouth every 8 (eight) hours as needed for up to 3 days for nausea or vomiting.       Discharge Instructions: Please refer to Patient Instructions section of EMR for full details.  Patient was counseled important signs and symptoms that should prompt return to medical care, changes in medications, dietary instructions, activity restrictions, and follow up appointments.   Follow-Up Appointments:  Follow-up Information    Karel Jarvis,  NP Follow up.   Specialty: Nurse Practitioner Contact information: [address] [place] [address] [phone]               Delora Fuel, MD 04/10/2020, 10:26 PM PGY-1, [place]

## 2020-04-10 NOTE — Progress Notes (Signed)
FAMILY MEDICINE TEACHING SERVICE Patient - Please contact intern pager [phone] (via phone or AMION, login: [email or web address]) for questions regarding care. Text pages welcome. DO NOT page or secure chat the listed attending provider unless there is no answer from the number above.

## 2020-04-10 NOTE — Progress Notes (Signed)
Discharge teaching complete. Meds, diet,activity, follow up appointments reviewed and all questions answered. Copy of instructions given to patient and prescription sent to pharmacy.  

## 2020-05-05 ENCOUNTER — Encounter (HOSPITAL_COMMUNITY): Payer: Self-pay | Admitting: Emergency Medicine

## 2020-05-05 ENCOUNTER — Other Ambulatory Visit: Payer: Self-pay

## 2020-05-05 ENCOUNTER — Emergency Department (HOSPITAL_COMMUNITY)
Admission: EM | Admit: 2020-05-05 | Discharge: 2020-05-05 | Disposition: A | Discharge status: Home / Self Care | Payer: Self-pay | Attending: Emergency Medicine | Admitting: Emergency Medicine

## 2020-05-05 ENCOUNTER — Emergency Department (HOSPITAL_COMMUNITY): Payer: Self-pay

## 2020-05-05 DIAGNOSIS — R109 Unspecified abdominal pain: Secondary | ICD-10-CM

## 2020-05-05 DIAGNOSIS — E039 Hypothyroidism, unspecified: Secondary | ICD-10-CM | POA: Insufficient documentation

## 2020-05-05 DIAGNOSIS — R112 Nausea with vomiting, unspecified: Secondary | ICD-10-CM | POA: Insufficient documentation

## 2020-05-05 DIAGNOSIS — R197 Diarrhea, unspecified: Secondary | ICD-10-CM | POA: Insufficient documentation

## 2020-05-05 DIAGNOSIS — Z79899 Other long term (current) drug therapy: Secondary | ICD-10-CM | POA: Insufficient documentation

## 2020-05-05 DIAGNOSIS — R1011 Right upper quadrant pain: Secondary | ICD-10-CM | POA: Insufficient documentation

## 2020-05-05 LAB — URINALYSIS, ROUTINE W REFLEX MICROSCOPIC
Bacteria, UA: NONE SEEN
Bilirubin Urine: NEGATIVE
Glucose, UA: NEGATIVE mg/dL
Hgb urine dipstick: NEGATIVE
Ketones, ur: NEGATIVE mg/dL
Leukocytes,Ua: NEGATIVE
Nitrite: NEGATIVE
Protein, ur: NEGATIVE mg/dL
Specific Gravity, Urine: 1.02 (ref 1.005–1.030)
pH: 6 (ref 5.0–8.0)

## 2020-05-05 LAB — COMPREHENSIVE METABOLIC PANEL
ALT: 47 U/L — ABNORMAL HIGH (ref 0–44)
AST: 221 U/L — ABNORMAL HIGH (ref 15–41)
Albumin: 2.1 g/dL — ABNORMAL LOW (ref 3.5–5.0)
Alkaline Phosphatase: 176 U/L — ABNORMAL HIGH (ref 38–126)
Anion gap: 9 (ref 5–15)
BUN: 5 mg/dL — ABNORMAL LOW (ref 6–20)
CO2: 22 mmol/L (ref 22–32)
Calcium: 7.5 mg/dL — ABNORMAL LOW (ref 8.9–10.3)
Chloride: 106 mmol/L (ref 98–111)
Creatinine, Ser: 0.53 mg/dL (ref 0.44–1.00)
GFR, Estimated: >60 mL/min (ref >60)
Glucose, Bld: 84 mg/dL (ref 70–99)
Potassium: 3.7 mmol/L (ref 3.5–5.1)
Sodium: 137 mmol/L (ref 135–145)
Total Bilirubin: 0.7 mg/dL (ref 0.3–1.2)
Total Protein: 6.4 g/dL — ABNORMAL LOW (ref 6.5–8.1)

## 2020-05-05 LAB — CBC
HCT: 33.6 % — ABNORMAL LOW (ref 36.0–46.0)
Hemoglobin: 10.4 g/dL — ABNORMAL LOW (ref 12.0–15.0)
MCH: 29.6 pg (ref 26.0–34.0)
MCHC: 31 g/dL (ref 30.0–36.0)
MCV: 95.7 fL (ref 80.0–100.0)
Platelets: 77 10*3/uL — ABNORMAL LOW (ref 150–400)
RBC: 3.51 MIL/uL — ABNORMAL LOW (ref 3.87–5.11)
RDW: 19 % — ABNORMAL HIGH (ref 11.5–15.5)
WBC: 2.5 10*3/uL — ABNORMAL LOW (ref 4.0–10.5)
nRBC: 0.8 % — ABNORMAL HIGH (ref 0.0–0.2)

## 2020-05-05 LAB — LIPASE, BLOOD: Lipase: 33 U/L (ref 11–51)

## 2020-05-05 LAB — I-STAT BETA HCG BLOOD, ED (MC, WL, AP ONLY): I-stat hCG, quantitative: <5 m[IU]/mL (ref <5)

## 2020-05-05 MED ORDER — FENTANYL CITRATE (PF) 100 MCG/2ML IJ SOLN
50.0000 ug | Freq: Once | INTRAMUSCULAR | Status: AC
  Administered 2020-05-05: 50 ug via INTRAVENOUS
  Filled 2020-05-05: qty 2

## 2020-05-05 MED ORDER — DICYCLOMINE HCL 20 MG PO TABS: 20.0000 mg | ORAL_TABLET | Freq: Two times a day (BID) | ORAL | 0 refills | Status: DC

## 2020-05-05 MED ORDER — ONDANSETRON HCL 4 MG/2ML IJ SOLN
4.0000 mg | Freq: Once | INTRAMUSCULAR | Status: AC
  Administered 2020-05-05: 4 mg via INTRAVENOUS
  Filled 2020-05-05: qty 2

## 2020-05-05 MED ORDER — SODIUM CHLORIDE 0.9 % IV BOLUS
1000.0000 mL | Freq: Once | INTRAVENOUS | Status: AC
  Administered 2020-05-05: 1000 mL via INTRAVENOUS

## 2020-05-05 NOTE — ED Provider Notes (Signed)
[facility] EMERGENCY DEPARTMENT Provider Note   CSN: [id number] Arrival date & time: 05/05/20  1202     History Chief Complaint  Patient presents with  . Abdominal Pain    Jessica Frazier is a 20 y.o. female with PMH/o RA, hypothyroidism who presents for evaluation of right upper quadrant abdominal pain that began yesterday.  She describes it as a dull pain time.  She states that she has had nausea and vomiting about a week ago prior to onset of pain.  She states that since last night, the pain has been coughing improvement but states that it was continued to persist morning, prompting ED visit.  He states that heat makes it feel better.  She states that it is worse whenever she moves, coughs or sneeze.  She was able to eat some soup last night she felt that her pain is worse.  She has also had some diarrhea that has been ongoing since yesterday.  No blood noted in stool.  She reports occasional alcohol use holidays but otherwise no frequent alcohol use.  She does not smoke and denies any drug use.  She has not any fevers, chest pain, difficulty breathing, dysuria, hematuria.  She was admitted in January 2022 for transaminitis that they thought was due to her RA medication renvoq.  At that time, she had an unremarkable work-up and she was discharged home.  She states she is followed up with her artery doctor since then he states that her LFTs work still slightly elevated but were improving.  She denies any history of IV drug use, frequent Tylenol use.    The history is provided by the patient.       Past Medical History:  Diagnosis Date  . Bell's palsy   . Hypothyroidism   . Rheumatoid arthritis [facility])     Patient Active Problem List   Diagnosis Date Noted  . Elevated LFTs 04/09/2020  . Hashimoto's thyroiditis 08/07/2019  . Discoloration of skin 08/07/2019  . 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 Acquired hypothyroidism 12/12/2018    History reviewed. No pertinent surgical  history.   OB History   No obstetric history on file.     Family History  Problem Relation Age of Onset  . Graves' disease Mother   . Healthy Father     Social History   Tobacco Use  . Smoking status: Never Smoker  . Smokeless tobacco: Never Used  Substance Use Topics  . Drug use: Never    Home Medications Prior to Admission medications   Medication Sig Start Date End Date Taking? Authorizing Provider  dicyclomine (BENTYL) 20 MG tablet Take 1 tablet (20 mg total) by mouth 2 (two) times daily for 5 days. 05/05/20 05/10/20 Yes Volanda Napoleon, PA-C  levothyroxine (SYNTHROID) 75 MCG tablet Take 1 tablet (75 mcg total) by mouth daily. 08/08/19  Yes Shamleffer, Melanie Crazier, MD  ondansetron (ZOFRAN-ODT) 4 MG disintegrating tablet Take 4-8 mg by mouth every 6 (six) hours as needed for nausea or vomiting (DISSOLVE ORALLY). sat 04/15/20  Yes [provider]    Allergies    Rinvoq [upadacitinib]  Review of Systems   Review of Systems  Constitutional: Negative for fever.  Respiratory: Negative for cough and shortness of breath.   Cardiovascular: Negative for chest pain.  Gastrointestinal: Positive for abdominal pain. Negative for nausea and vomiting.  Genitourinary: Negative for dysuria and hematuria.  Neurological: Negative for headaches.  All other systems reviewed and are negative.   Physical Exam  Updated Vital Signs BP 111/73 (BP Location: Right Arm)   Pulse 98   Temp 99.5 F (37.5 C) (Oral)   Resp 16   Ht _0  (1.575 m)   Wt 47.6 kg   SpO2 100%   BMI 19.20 kg/m   Physical Exam Vitals and nursing note reviewed.  Constitutional:      Appearance: Normal appearance. She is well-developed and well-nourished.  HENT:     Head: Normocephalic and atraumatic.     Mouth/Throat:     Mouth: Oropharynx is clear and moist and mucous membranes are normal.  Eyes:     General: Lids are normal.     Extraocular Movements: EOM normal.     Conjunctiva/sclera:  Conjunctivae normal.     Pupils: Pupils are equal, round, and reactive to light.  Cardiovascular:     Rate and Rhythm: Normal rate and regular rhythm.     Pulses: Normal pulses.     Heart sounds: Normal heart sounds. No murmur heard. No friction rub. No gallop.   Pulmonary:     Effort: Pulmonary effort is normal.     Breath sounds: Normal breath sounds.     Comments: Lungs clear to auscultation bilaterally.  Symmetric chest rise.  No wheezing, rales, rhonchi. Abdominal:     Palpations: Abdomen is soft. Abdomen is not rigid.     Tenderness: There is abdominal tenderness in the right upper quadrant. There is no guarding. Positive signs include Murphy's sign.     Comments: Tenderness palpation of the right upper quadrant.  No rigidity, guarding.  No focal tenderness of McBurney's point.  Positive Murphy sign. No CVA tenderness noted bilaterally.   Musculoskeletal:        General: Normal range of motion.     Cervical back: Full passive range of motion without pain.  Skin:    General: Skin is warm and dry.     Capillary Refill: Capillary refill takes less than 2 seconds.  Neurological:     Mental Status: She is alert and oriented to person, place, and time.  Psychiatric:        Mood and Affect: Mood and affect normal.        Speech: Speech normal.     ED Results / Procedures / Treatments   Labs (all labs ordered are listed, but only abnormal results are displayed) Labs Reviewed  COMPREHENSIVE METABOLIC PANEL - Abnormal; Notable for the following components:      Result Value   BUN 5 (*)    Calcium 7.5 (*)    Total Protein 6.4 (*)    Albumin 2.1 (*)    AST 221 (*)    ALT 47 (*)    Alkaline Phosphatase 176 (*)    All other components within normal limits  CBC - Abnormal; Notable for the following components:   WBC 2.5 (*)    RBC 3.51 (*)    Hemoglobin 10.4 (*)    HCT 33.6 (*)    RDW 19.0 (*)    Platelets 77 (*)    nRBC 0.8 (*)    All other components within normal limits   URINALYSIS, ROUTINE W REFLEX MICROSCOPIC - Abnormal; Notable for the following components:   Color, Urine AMBER (*)    All other components within normal limits  LIPASE, BLOOD  I-STAT BETA HCG BLOOD, ED ([facility], [facility], [facility] ONLY)    EKG None  Radiology US Abdomen Limited RUQ (LIVER/GB)  Result Date: 05/05/2020 CLINICAL DATA:  Right upper quadrant  pain and vomiting EXAM: ULTRASOUND ABDOMEN LIMITED RIGHT UPPER QUADRANT COMPARISON:  04/09/2020 FINDINGS: Gallbladder: No gallstones or wall thickening visualized. No sonographic Murphy sign noted by sonographer. Common bile duct: Diameter: 2 mm Liver: No focal lesion. Diffusely increased parenchymal echogenicity. Portal vein is patent on color Doppler imaging with normal direction of blood flow towards the liver. Other: None. IMPRESSION: Diffuse increased echogenicity of the hepatic parenchyma is a nonspecific indicator of hepatocellular dysfunction, most commonly steatosis. Electronically Signed   By: Miachel Roux M.D.   On: 05/05/2020 16:17    Procedures Procedures   Medications Ordered in ED Medications  sodium chloride 0.9 % bolus 1,000 mL (0 mLs Intravenous Stopped 05/05/20 1815)  sodium chloride 0.9 % bolus 1,000 mL (0 mLs Intravenous Stopped 05/05/20 1815)  ondansetron (ZOFRAN) injection 4 mg (4 mg Intravenous Given 05/05/20 1638)  fentaNYL (SUBLIMAZE) injection 50 mcg (50 mcg Intravenous Given 05/05/20 1638)    ED Course  I have reviewed the triage vital signs and the nursing notes.  Pertinent labs & imaging results that were available during my care of the patient were reviewed by me and considered in my medical decision making (see chart for details).  Clinical Course as of 05/05/20 2005  Mon May 05, 2020  1518 Alkaline Phosphatase(!): 176 [LA]    Clinical Course User Index [LA] Leary Roca   MDM Rules/Calculators/A&P                          20 year old female who presents for evaluation of right upper quadrant  abdominal pain that began yesterday.  She states she has had some associated diarrhea.  She does report that prior to pain, she had some nausea/vomiting but was able to eat soup last night without causing any significant pain.  She has a history of transaminitis that they think was from her RA medication about a month ago.  She reports she is followed with her rheumatologist and noted that her LFTs were still slightly high but had gone down.  She denies any fevers, chest pain, difficulty breathing.  Pain is worsened with movement, coughing, sneezing.  It is better with heat.  On initial arrival, she is afebrile, toxic appearing.  Vital signs are stable.  She is slightly tachycardic.  Likely secondary to pain.  Vitals otherwise stable.  On exam, she has tenderness noted to the right upper quadrant.  No rigidity, guarding.  No CVA tenderness.  We will plan for labs.  UA negative for any infectious etiology.  I-STAT beta is negative.  Lipase unremarkable.  CMP shows AST of 221, ALT of 47, alk phos of 176.  This is an improvement from her previous labs that were seen during her admission last month.  I do not have any priors after her discharge I do not know if she went back to normal and then started getting elevated again but she tells me that her RA doctor did notice some elevations.  Given that she is having tenderness, elevations, we will plan for repeat ultrasound.  At this time, do not feel that she needs further work-up as she has had negative hepatitis panel, ethanol, mono, Tylenol level.  Ultrasound shows diffuse increased echogenicity of the hepatic parenchyma is nonspecific.  Could be related to hepatic steatosis.  Reevaluation.  Patient's vitals improved after pain medication. She is no longer tachycardia. Patient reports improvement in pain.  Repeat abdominal exam shows improvement tenderness.  At this time,  unclear etiology of her symptoms but feel she is stable for discharge home.  We discussed  that this could be a viral GI process given her diarrhea.  She has not followed up with a GI doctor.  Given her continued elevation in her LFTs, feel that this is reasonable. At this time, patient exhibits no emergent life-threatening condition that require further evaluation in ED. Discussed patient with Dr. Darl Householder. Patient had ample opportunity for questions and discussion. All patient's questions were answered with full understanding. Strict return precautions discussed. Patient expresses understanding and agreement to plan.   Portions of this note were generated with [employer]. Dictation errors may occur despite best attempts at proofreading.  Final Clinical Impression(s) / ED Diagnoses Final diagnoses:  Abdominal pain    Rx / DC Orders ED Discharge Orders         Ordered    dicyclomine (BENTYL) 20 MG tablet  2 times daily        05/05/20 1751           Desma Mcgregor 05/05/20 2006    Drenda Freeze, MD 05/05/20 2220

## 2020-05-05 NOTE — ED Triage Notes (Signed)
Pt arrives to ED with chief complaint of RUQ pain she states she recently had an enlarged liver due to a medication she was on.

## 2020-05-05 NOTE — ED Notes (Signed)
To [place] 1600

## 2020-05-05 NOTE — Medical Student Note (Addendum)
[facility] DEPT Provider Student Note For educational purposes for Medical, PA and NP students only and not part of the legal medical record.   CSN: [id number] Arrival date & time: 05/05/20  1202  History   Chief Complaint Chief Complaint  Patient presents with  . Abdominal Pain    HPI Jessica Frazier is a 20 y.o. female with PMH of RA and hypothyroidism who presents to ED for abdominal pain.  States she was doing well until yesterday when she began having "bad pain" in the morning. States the RUQ pain is at worst 8/10. Describes it as dull and constant. Worse when she increases abdominal pressure with coughing/sneezing/taking a deep breath. Is alleviated by heat. She took one dose of tylenol last night which she states alleviated some pain but with continued pain this morning came to the ED. She endorses nausea and vomiting multiple times since last week. Denies she was having abdominal pain at that time. Denies hematemesis. Endorses diarrhea 2-3 times since yesterday. Light brown stools without hematochezia or melena. Denies eating aggravating symptoms. Denies fevers or bloating. Denies noticing any scleral icterus or jaundice. Endorses recreational alcohol use on holidays only. Denies smoking or drug use.   She had recent admission for transaminitis. Her work-up was negative for acute hepatitis, HIV, RPR, toxins. Suspected Rinvoq medication for her RA. They discontinued her medication with continued follow up with rheumatology. She has been off medication for 1.5 months.   Past Medical History:  Diagnosis Date  . Bell's palsy   . Hypothyroidism   . Rheumatoid arthritis [facility])     Patient Active Problem List   Diagnosis Date Noted  . Elevated LFTs 04/09/2020  . Hashimoto's thyroiditis 08/07/2019  . Discoloration of skin 08/07/2019  . 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 Acquired hypothyroidism 12/12/2018   History reviewed. No pertinent surgical history.  OB History   No obstetric  history on file.    Home Medications    Prior to Admission medications   Medication Sig Start Date End Date Taking? Authorizing Provider  levothyroxine (SYNTHROID) 75 MCG tablet Take 1 tablet (75 mcg total) by mouth daily. 08/08/19   Shamleffer, Melanie Crazier, MD   Family History Family History  Problem Relation Age of Onset  . Graves' disease Mother   . Healthy Father    Social History Social History   Tobacco Use  . Smoking status: Never Smoker  . Smokeless tobacco: Never Used  Substance Use Topics  . Drug use: Never   Allergies   Patient has no known allergies.  Review of Systems Review of Systems  Constitutional: Positive for appetite change, chills and fatigue. Negative for diaphoresis and fever.  HENT: Negative.   Eyes: Negative.   Respiratory: Positive for cough.   Cardiovascular: Negative.   Gastrointestinal: Positive for abdominal pain, diarrhea, nausea and vomiting. Negative for blood in stool.  Endocrine: Negative.   Genitourinary: Negative.   Musculoskeletal: Negative.   Skin: Negative.   Allergic/Immunologic: Negative.   Neurological: Negative.   Hematological: Negative.  Does not bruise/bleed easily.  Psychiatric/Behavioral: Negative.    Physical Exam Updated Vital Signs BP 107/60 (BP Location: Right Arm)   Pulse (!) 124   Temp 99.9 F (37.7 C) (Oral)   Resp 16   SpO2 100%   Physical Exam Vitals and nursing note reviewed.  Constitutional:      General: She is not in acute distress. HENT:     Head: Normocephalic.  Eyes:     General: No scleral icterus.  Extraocular Movements: Extraocular movements intact.     Pupils: Pupils are equal, round, and reactive to light.  Cardiovascular:     Rate and Rhythm: Regular rhythm. Tachycardia present.     Heart sounds: Normal heart sounds.  Pulmonary:     Effort: Pulmonary effort is normal.     Breath sounds: Normal breath sounds.  Abdominal:     General: Abdomen is flat. Bowel sounds are  decreased. There is no distension.     Palpations: Abdomen is soft.     Tenderness: There is abdominal tenderness in the right upper quadrant, right lower quadrant and epigastric area. There is no right CVA tenderness, left CVA tenderness or rebound. Positive signs include Murphy's sign.  Skin:    General: Skin is warm and dry.     Capillary Refill: Capillary refill takes less than 2 seconds.     Coloration: Skin is not jaundiced.  Neurological:     General: No focal deficit present.     Mental Status: She is alert.    ED Treatments / Results  Labs (all labs ordered are listed, but only abnormal results are displayed) Labs Reviewed  COMPREHENSIVE METABOLIC PANEL - Abnormal; Notable for the following components:      Result Value   BUN 5 (*)    Calcium 7.5 (*)    Total Protein 6.4 (*)    Albumin 2.1 (*)    AST 221 (*)    ALT 47 (*)    Alkaline Phosphatase 176 (*)    All other components within normal limits  CBC - Abnormal; Notable for the following components:   WBC 2.5 (*)    RBC 3.51 (*)    Hemoglobin 10.4 (*)    HCT 33.6 (*)    RDW 19.0 (*)    Platelets 77 (*)    nRBC 0.8 (*)    All other components within normal limits  URINALYSIS, ROUTINE W REFLEX MICROSCOPIC - Abnormal; Notable for the following components:   Color, Urine AMBER (*)    All other components within normal limits  LIPASE, BLOOD  I-STAT BETA HCG BLOOD, ED ([facility], [facility], [facility] ONLY)   EKG  Radiology US Abdomen Limited RUQ (LIVER/GB)  Result Date: 05/05/2020 CLINICAL DATA:  Right upper quadrant pain and vomiting EXAM: ULTRASOUND ABDOMEN LIMITED RIGHT UPPER QUADRANT COMPARISON:  04/09/2020 FINDINGS: Gallbladder: No gallstones or wall thickening visualized. No sonographic Murphy sign noted by sonographer. Common bile duct: Diameter: 2 mm Liver: No focal lesion. Diffusely increased parenchymal echogenicity. Portal vein is patent on color Doppler imaging with normal direction of blood flow towards the liver. Other:  None. IMPRESSION: Diffuse increased echogenicity of the hepatic parenchyma is a nonspecific indicator of hepatocellular dysfunction, most commonly steatosis. Electronically Signed   By: Miachel Roux M.D.   On: 05/05/2020 16:17    Procedures Procedures (including critical care time)  Medications Ordered in ED Medications  sodium chloride 0.9 % bolus 1,000 mL (1,000 mLs Intravenous New Bag/Given 05/05/20 1642)  sodium chloride 0.9 % bolus 1,000 mL (1,000 mLs Intravenous New Bag/Given 05/05/20 1636)  ondansetron (ZOFRAN) injection 4 mg (4 mg Intravenous Given 05/05/20 1638)  fentaNYL (SUBLIMAZE) injection 50 mcg (50 mcg Intravenous Given 05/05/20 1638)   Initial Impression / Assessment and Plan / ED Course  I have reviewed the triage vital signs and the nursing notes.  Pertinent labs & imaging results that were available during my care of the patient were reviewed by me and considered in my medical decision making (  see chart for details).  Halie Gass is a 60yoF with PMH of RA and hypothyroidism who presents for HPI as stated above. Differential diagnosis includes acute on chronic hepatitis, cholecystitis, pancreatitis, ascending UTI.  Unlikely pancreatitis. Lipase is 33. No LUQ or back pain.  UA obtained to r/o UTI which was negative. Obtained RUQ ultrasound showing diffuse echogenicity of the hepatic parenchyma.   Abdominal Pain  - Pain is likely due to chronic hepatitis picture. She had rheumatology appointment on 04/22/20 and states her rheumatologist stated her LFTs were improving but does not sound like they ever returned to normal. Because of this I do not believe current transaminitis is due to an acute inflammation.  - avoid hepatotoxins  - continue to avoid Rinvoq  Pancytopenia - appears chronic. Was pancytopenic on admission in January. - Likely anemia of chronic disease as there is no acute bleed present and history of RA. - platelets decreased d/t liver dysfunction. No  petechiae present. No signs of bleeding.   Tachycardia - likely dehydration from vomiting and diarrhea. - replaced with 2L IVF with improvement   Final Clinical Impressions(s) / ED Diagnoses   Final diagnoses:  Abdominal pain    New Prescriptions New Prescriptions   No medications on file

## 2020-05-05 NOTE — Discharge Instructions (Signed)
As we discussed, your work-up here looked reassuring.  Your LFTs were still slightly elevated.  Because of this persistent elevation, we will have you follow-up with your GI doctor.  Take Bentyl as directed.  Return to emergency department for any worsening abdominal pain, vomiting, difficulty breathing, chest pain or any other worsening concerning symptoms.

## 2020-05-15 ENCOUNTER — Encounter: Payer: Self-pay | Admitting: Nurse Practitioner

## 2020-05-29 ENCOUNTER — Ambulatory Visit: Payer: Self-pay | Admitting: Nurse Practitioner

## 2020-06-02 ENCOUNTER — Other Ambulatory Visit: Payer: Self-pay

## 2020-06-02 ENCOUNTER — Encounter (HOSPITAL_COMMUNITY): Payer: Self-pay | Admitting: Emergency Medicine

## 2020-06-02 ENCOUNTER — Emergency Department (HOSPITAL_COMMUNITY): Payer: 59

## 2020-06-02 ENCOUNTER — Inpatient Hospital Stay (HOSPITAL_COMMUNITY)
Admission: EM | Admit: 2020-06-02 | Discharge: 2020-06-16 | DRG: 082 | Disposition: A | Discharge status: Rehabilitation Facility | Payer: 59 | Source: Ambulatory Visit | Attending: Family Medicine | Admitting: Family Medicine

## 2020-06-02 DIAGNOSIS — S065X9A Traumatic subdural hemorrhage with loss of consciousness of unspecified duration, initial encounter: Secondary | ICD-10-CM | POA: Diagnosis present

## 2020-06-02 DIAGNOSIS — R000 Tachycardia, unspecified: Secondary | ICD-10-CM

## 2020-06-02 DIAGNOSIS — K7581 Nonalcoholic steatohepatitis (NASH): Secondary | ICD-10-CM | POA: Diagnosis present

## 2020-06-02 DIAGNOSIS — Z79890 Hormone replacement therapy (postmenopausal): Secondary | ICD-10-CM

## 2020-06-02 DIAGNOSIS — E872 Acidosis: Secondary | ICD-10-CM | POA: Diagnosis not present

## 2020-06-02 DIAGNOSIS — Y92238 Other place in hospital as the place of occurrence of the external cause: Secondary | ICD-10-CM | POA: Diagnosis present

## 2020-06-02 DIAGNOSIS — E43 Unspecified severe protein-calorie malnutrition: Secondary | ICD-10-CM | POA: Diagnosis present

## 2020-06-02 DIAGNOSIS — K760 Fatty (change of) liver, not elsewhere classified: Secondary | ICD-10-CM

## 2020-06-02 DIAGNOSIS — M0600 Rheumatoid arthritis without rheumatoid factor, unspecified site: Secondary | ICD-10-CM | POA: Diagnosis present

## 2020-06-02 DIAGNOSIS — Z008 Encounter for other general examination: Secondary | ICD-10-CM

## 2020-06-02 DIAGNOSIS — Z20822 Contact with and (suspected) exposure to covid-19: Secondary | ICD-10-CM | POA: Diagnosis present

## 2020-06-02 DIAGNOSIS — R059 Cough, unspecified: Secondary | ICD-10-CM

## 2020-06-02 DIAGNOSIS — R6510 Systemic inflammatory response syndrome (SIRS) of non-infectious origin without acute organ dysfunction: Secondary | ICD-10-CM

## 2020-06-02 DIAGNOSIS — K7469 Other cirrhosis of liver: Secondary | ICD-10-CM | POA: Diagnosis present

## 2020-06-02 DIAGNOSIS — R5084 Febrile nonhemolytic transfusion reaction: Secondary | ICD-10-CM | POA: Diagnosis present

## 2020-06-02 DIAGNOSIS — E039 Hypothyroidism, unspecified: Secondary | ICD-10-CM | POA: Diagnosis present

## 2020-06-02 DIAGNOSIS — E8809 Other disorders of plasma-protein metabolism, not elsewhere classified: Secondary | ICD-10-CM | POA: Diagnosis not present

## 2020-06-02 DIAGNOSIS — Z4659 Encounter for fitting and adjustment of other gastrointestinal appliance and device: Secondary | ICD-10-CM

## 2020-06-02 DIAGNOSIS — K7200 Acute and subacute hepatic failure without coma: Secondary | ICD-10-CM

## 2020-06-02 DIAGNOSIS — I619 Nontraumatic intracerebral hemorrhage, unspecified: Secondary | ICD-10-CM | POA: Diagnosis present

## 2020-06-02 DIAGNOSIS — F32A Depression, unspecified: Secondary | ICD-10-CM | POA: Diagnosis present

## 2020-06-02 DIAGNOSIS — R339 Retention of urine, unspecified: Secondary | ICD-10-CM | POA: Diagnosis not present

## 2020-06-02 DIAGNOSIS — S06369A Traumatic hemorrhage of cerebrum, unspecified, with loss of consciousness of unspecified duration, initial encounter: Secondary | ICD-10-CM | POA: Diagnosis present

## 2020-06-02 DIAGNOSIS — D649 Anemia, unspecified: Secondary | ICD-10-CM

## 2020-06-02 DIAGNOSIS — R509 Fever, unspecified: Secondary | ICD-10-CM

## 2020-06-02 DIAGNOSIS — C9240 Acute promyelocytic leukemia, not having achieved remission: Secondary | ICD-10-CM

## 2020-06-02 DIAGNOSIS — D688 Other specified coagulation defects: Secondary | ICD-10-CM | POA: Diagnosis present

## 2020-06-02 DIAGNOSIS — W010XXA Fall on same level from slipping, tripping and stumbling without subsequent striking against object, initial encounter: Secondary | ICD-10-CM | POA: Diagnosis present

## 2020-06-02 DIAGNOSIS — S066X9A Traumatic subarachnoid hemorrhage with loss of consciousness of unspecified duration, initial encounter: Principal | ICD-10-CM | POA: Diagnosis present

## 2020-06-02 DIAGNOSIS — I81 Portal vein thrombosis: Secondary | ICD-10-CM

## 2020-06-02 DIAGNOSIS — E0781 Sick-euthyroid syndrome: Secondary | ICD-10-CM | POA: Diagnosis present

## 2020-06-02 DIAGNOSIS — I609 Nontraumatic subarachnoid hemorrhage, unspecified: Secondary | ICD-10-CM

## 2020-06-02 DIAGNOSIS — D539 Nutritional anemia, unspecified: Secondary | ICD-10-CM | POA: Diagnosis present

## 2020-06-02 DIAGNOSIS — D689 Coagulation defect, unspecified: Secondary | ICD-10-CM

## 2020-06-02 DIAGNOSIS — R64 Cachexia: Secondary | ICD-10-CM | POA: Diagnosis present

## 2020-06-02 DIAGNOSIS — Z6820 Body mass index (BMI) 20.0-20.9, adult: Secondary | ICD-10-CM

## 2020-06-02 DIAGNOSIS — M069 Rheumatoid arthritis, unspecified: Secondary | ICD-10-CM

## 2020-06-02 DIAGNOSIS — R7989 Other specified abnormal findings of blood chemistry: Secondary | ICD-10-CM

## 2020-06-02 DIAGNOSIS — D696 Thrombocytopenia, unspecified: Secondary | ICD-10-CM

## 2020-06-02 DIAGNOSIS — D61818 Other pancytopenia: Secondary | ICD-10-CM | POA: Diagnosis present

## 2020-06-02 DIAGNOSIS — D65 Disseminated intravascular coagulation [defibrination syndrome]: Secondary | ICD-10-CM | POA: Diagnosis present

## 2020-06-02 DIAGNOSIS — L8990 Pressure ulcer of unspecified site, unspecified stage: Secondary | ICD-10-CM | POA: Insufficient documentation

## 2020-06-02 LAB — URINALYSIS, ROUTINE W REFLEX MICROSCOPIC
Bilirubin Urine: NEGATIVE
Glucose, UA: NEGATIVE mg/dL
Hgb urine dipstick: NEGATIVE
Ketones, ur: NEGATIVE mg/dL
Leukocytes,Ua: NEGATIVE
Nitrite: NEGATIVE
Protein, ur: 30 mg/dL — AB
Specific Gravity, Urine: 1.018 (ref 1.005–1.030)
pH: 6 (ref 5.0–8.0)

## 2020-06-02 LAB — COMPREHENSIVE METABOLIC PANEL
ALT: 40 U/L (ref 0–44)
AST: 118 U/L — ABNORMAL HIGH (ref 15–41)
Albumin: 1.6 g/dL — ABNORMAL LOW (ref 3.5–5.0)
Alkaline Phosphatase: 169 U/L — ABNORMAL HIGH (ref 38–126)
Anion gap: 8 (ref 5–15)
BUN: <5 mg/dL — ABNORMAL LOW (ref 6–20)
CO2: 23 mmol/L (ref 22–32)
Calcium: 7.2 mg/dL — ABNORMAL LOW (ref 8.9–10.3)
Chloride: 98 mmol/L (ref 98–111)
Creatinine, Ser: 0.5 mg/dL (ref 0.44–1.00)
GFR, Estimated: >60 mL/min (ref >60)
Glucose, Bld: 79 mg/dL (ref 70–99)
Potassium: 3.4 mmol/L — ABNORMAL LOW (ref 3.5–5.1)
Sodium: 129 mmol/L — ABNORMAL LOW (ref 135–145)
Total Bilirubin: 1.3 mg/dL — ABNORMAL HIGH (ref 0.3–1.2)
Total Protein: 6.6 g/dL (ref 6.5–8.1)

## 2020-06-02 LAB — I-STAT BETA HCG BLOOD, ED (MC, WL, AP ONLY): I-stat hCG, quantitative: <5 m[IU]/mL (ref <5)

## 2020-06-02 LAB — LIPASE, BLOOD: Lipase: 42 U/L (ref 11–51)

## 2020-06-02 LAB — CBC
HCT: 30.1 % — ABNORMAL LOW (ref 36.0–46.0)
Hemoglobin: 9.6 g/dL — ABNORMAL LOW (ref 12.0–15.0)
MCH: 30.9 pg (ref 26.0–34.0)
MCHC: 31.9 g/dL (ref 30.0–36.0)
MCV: 96.8 fL (ref 80.0–100.0)
Platelets: 61 10*3/uL — ABNORMAL LOW (ref 150–400)
RBC: 3.11 MIL/uL — ABNORMAL LOW (ref 3.87–5.11)
RDW: 16.9 % — ABNORMAL HIGH (ref 11.5–15.5)
WBC: 5.1 10*3/uL (ref 4.0–10.5)
nRBC: 0.4 % — ABNORMAL HIGH (ref 0.0–0.2)

## 2020-06-02 LAB — POC OCCULT BLOOD, ED: Fecal Occult Bld: NEGATIVE

## 2020-06-02 MED ORDER — IOHEXOL 350 MG/ML SOLN
100.0000 mL | Freq: Once | INTRAVENOUS | Status: AC | PRN
  Administered 2020-06-02: 100 mL via INTRAVENOUS

## 2020-06-02 MED ORDER — SODIUM CHLORIDE 0.9 % IV BOLUS
1000.0000 mL | Freq: Once | INTRAVENOUS | Status: AC
  Administered 2020-06-02: 1000 mL via INTRAVENOUS

## 2020-06-02 MED ORDER — FENTANYL CITRATE (PF) 100 MCG/2ML IJ SOLN
12.5000 ug | Freq: Once | INTRAMUSCULAR | Status: AC
  Administered 2020-06-02: 12.5 ug via INTRAVENOUS
  Filled 2020-06-02: qty 2

## 2020-06-02 NOTE — ED Triage Notes (Signed)
Pt reports having RA and been off of medications for 2 months here today due to abdominal pain, with black stools.  No vomiting but feels very nauseous.

## 2020-06-02 NOTE — ED Provider Notes (Addendum)
[facility] EMERGENCY DEPARTMENT Provider Note   CSN: [id number] Arrival date & time: 06/02/20  1414     History Chief Complaint  Patient presents with  . Abdominal Pain    Tachy     Jessica Frazier is a 20 y.o. female with a past medical history of hypothyroidism, RA not currently on medications presenting to the ED with a chief complaint of abdominal pain and black stools.  States that on 05/29/2020, she started experiencing worsening pain related to her RA.  She took 1 dose of Omega XL in hopes that this will help with the swelling.  She is not taking this medication before and was not told to take this by a healthcare provider.  The next day she started experiencing nausea, vomiting and abdominal pain.  After this she started noticing "black mucus" when she had a bowel movement.  Denies any bright red blood or blood when she wipes the area.  She initially thought that these were side effects of taking the medication but was unsure due to her persistent symptoms.  She has not been on medications for her RA for the past 2 months, last was on Rinvoq but told to discontinue this at her last admission at the end of January 2022 when she was found to have transaminitis and pancytopenia which was thought to be related to this medication. She denies any chest pain, shortness of breath, anticoagulant use, fever, pelvic complaints, bloody emesis or sick contacts with similar symptoms.  No prior abdominal surgeries.  She is scheduled to see her GI doctor in about 10 days for follow-up. No prior scoping or history of GI bleed.  HPI     Past Medical History:  Diagnosis Date  . Bell's palsy   . Hypothyroidism   . Rheumatoid arthritis [facility])     Patient Active Problem List   Diagnosis Date Noted  . Elevated LFTs 04/09/2020  . Hashimoto's thyroiditis 08/07/2019  . Discoloration of skin 08/07/2019  . 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 Acquired hypothyroidism 12/12/2018    History  reviewed. No pertinent surgical history.   OB History   No obstetric history on file.     Family History  Problem Relation Age of Onset  . Graves' disease Mother   . Healthy Father     Social History   Tobacco Use  . Smoking status: Never Smoker  . Smokeless tobacco: Never Used  Substance Use Topics  . Drug use: Never    Home Medications Prior to Admission medications   Medication Sig Start Date End Date Taking? Authorizing Provider  levothyroxine (SYNTHROID) 75 MCG tablet Take 1 tablet (75 mcg total) by mouth daily. 08/08/19  Yes Shamleffer, Melanie Crazier, MD  dicyclomine (BENTYL) 20 MG tablet Take 1 tablet (20 mg total) by mouth 2 (two) times daily for 5 days. Patient not taking: Reported on 06/02/2020 05/05/20 05/10/20  [place]idence Lanius A, PA-C    Allergies    Other and Rinvoq [upadacitinib]  Review of Systems   Review of Systems  Constitutional: Negative for appetite change, chills and fever.  HENT: Negative for ear pain, rhinorrhea, sneezing and sore throat.   Eyes: Negative for photophobia and visual disturbance.  Respiratory: Negative for cough, chest tightness, shortness of breath and wheezing.   Cardiovascular: Negative for chest pain and palpitations.  Gastrointestinal: Positive for abdominal pain. Negative for blood in stool, constipation, diarrhea, nausea and vomiting.       +black mucous in stool  Genitourinary: Negative for  dysuria, hematuria and urgency.  Musculoskeletal: Negative for myalgias.  Skin: Negative for rash.  Neurological: Negative for dizziness, weakness and light-headedness.    Physical Exam Updated Vital Signs BP (!) 108/55   Pulse (!) 118   Temp 99.7 F (37.6 C) (Oral)   Resp (!) 29   SpO2 96%   Physical Exam Vitals and nursing note reviewed. Exam conducted with a chaperone present.  Constitutional:      General: She is not in acute distress.    Appearance: She is well-developed.  HENT:     Head: Normocephalic and atraumatic.      Nose: Nose normal.  Eyes:     General: No scleral icterus.       Left eye: No discharge.     Conjunctiva/sclera: Conjunctivae normal.  Cardiovascular:     Rate and Rhythm: Regular rhythm. Tachycardia present.     Heart sounds: Normal heart sounds. No murmur heard. No friction rub. No gallop.   Pulmonary:     Effort: Pulmonary effort is normal. No respiratory distress.     Breath sounds: Normal breath sounds.  Abdominal:     General: Bowel sounds are normal. There is no distension.     Palpations: Abdomen is soft.     Tenderness: There is abdominal tenderness in the periumbilical area. There is no guarding.  Genitourinary:    Comments: Rectal exam revealed brown stool.  No external abnormalities or tenderness. Musculoskeletal:        General: Normal range of motion.     Cervical back: Normal range of motion and neck supple.  Skin:    General: Skin is warm and dry.     Findings: No rash.  Neurological:     Mental Status: She is alert.     Motor: No abnormal muscle tone.     Coordination: Coordination normal.     ED Results / Procedures / Treatments   Labs (all labs ordered are listed, but only abnormal results are displayed) Labs Reviewed  COMPREHENSIVE METABOLIC PANEL - Abnormal; Notable for the following components:      Result Value   Sodium 129 (*)    Potassium 3.4 (*)    BUN <5 (*)    Calcium 7.2 (*)    Albumin 1.6 (*)    AST 118 (*)    Alkaline Phosphatase 169 (*)    Total Bilirubin 1.3 (*)    All other components within normal limits  CBC - Abnormal; Notable for the following components:   RBC 3.11 (*)    Hemoglobin 9.6 (*)    HCT 30.1 (*)    RDW 16.9 (*)    Platelets 61 (*)    nRBC 0.4 (*)    All other components within normal limits  URINALYSIS, ROUTINE W REFLEX MICROSCOPIC - Abnormal; Notable for the following components:   Color, Urine AMBER (*)    APPearance HAZY (*)    Protein, ur 30 (*)    Bacteria, UA RARE (*)    All other components within  normal limits  LIPASE, BLOOD  TSH  PROTIME-INR  I-STAT BETA HCG BLOOD, ED ([facility], [facility], [facility] ONLY)  POC OCCULT BLOOD, ED  TYPE AND SCREEN  ABO/RH    EKG None  Radiology CT Angio Chest PE W/Cm &/Or Wo Cm  Result Date: 06/02/2020 CLINICAL DATA:  20 year old female with abdominal pain and concern for pulmonary embolism. EXAM: CT ANGIOGRAPHY CHEST CT ABDOMEN AND PELVIS WITH CONTRAST TECHNIQUE: Multidetector CT imaging of the chest was  performed using the standard protocol during bolus administration of intravenous contrast. Multiplanar CT image reconstructions and MIPs were obtained to evaluate the vascular anatomy. Multidetector CT imaging of the abdomen and pelvis was performed using the standard protocol during bolus administration of intravenous contrast. CONTRAST:  171mL OMNIPAQUE IOHEXOL 350 MG/ML SOLN COMPARISON:  Abdominal ultrasound dated 05/05/2020. FINDINGS: CTA CHEST FINDINGS Cardiovascular: There is no cardiomegaly or pericardial effusion. The thoracic aorta is unremarkable. The origins of the great vessels of the aortic arch appear patent. Faint linear hyperdensity along the left lateral wall of the pulmonary trunk (56/6) likely artifactual. No pulmonary artery embolus identified. Mediastinum/Nodes: There is no hilar or mediastinal adenopathy. The esophagus is grossly unremarkable. No mediastinal fluid collection. Lungs/Pleura: Bibasilar linear atelectasis/scarring. No focal consolidation, pleural effusion, or pneumothorax. The central airways are patent. Musculoskeletal: No chest wall abnormality. No acute or significant osseous findings. Review of the MIP images confirms the above findings. CT ABDOMEN and PELVIS FINDINGS No intra-abdominal free air.  Small ascites. Hepatobiliary: Severe fatty liver. The liver is enlarged measuring 19 cm in midclavicular length. Correlation with clinical exam and LFTs recommended to evaluate for steatohepatitis. No intrahepatic biliary ductal dilatation. The  gallbladder is predominantly contracted. No calcified gallstone. Pancreas: Unremarkable. No pancreatic ductal dilatation or surrounding inflammatory changes. Spleen: Normal in size without focal abnormality. Adrenals/Urinary Tract: The adrenal glands are unremarkable. There is no hydronephrosis on either side. The visualized ureters appear unremarkable. The urinary bladder is minimally distended and grossly unremarkable. Stomach/Bowel: There is no bowel obstruction or active inflammation. The appendix is normal. Vascular/Lymphatic: The abdominal aorta and IVC are unremarkable. No portal venous gas. There is no adenopathy. Reproductive: The uterus is anteverted and grossly unremarkable. No adnexal masses. Other: Mild diffuse subcutaneous edema. Musculoskeletal: No acute or significant osseous findings. Review of the MIP images confirms the above findings. IMPRESSION: 1. No acute intrathoracic pathology. No CT evidence of pulmonary embolism. 2. Severe fatty liver with findings of possible steatohepatitis. Clinical correlation is recommended. 3. Small ascites. 4. No bowel obstruction. Normal appendix. Electronically Signed   By: Anner Crete M.D.   On: 06/02/2020 21:26   CT ABDOMEN PELVIS W CONTRAST  Result Date: 06/02/2020 CLINICAL DATA:  20 year old female with abdominal pain and concern for pulmonary embolism. EXAM: CT ANGIOGRAPHY CHEST CT ABDOMEN AND PELVIS WITH CONTRAST TECHNIQUE: Multidetector CT imaging of the chest was performed using the standard protocol during bolus administration of intravenous contrast. Multiplanar CT image reconstructions and MIPs were obtained to evaluate the vascular anatomy. Multidetector CT imaging of the abdomen and pelvis was performed using the standard protocol during bolus administration of intravenous contrast. CONTRAST:  137mL OMNIPAQUE IOHEXOL 350 MG/ML SOLN COMPARISON:  Abdominal ultrasound dated 05/05/2020. FINDINGS: CTA CHEST FINDINGS Cardiovascular: There is no  cardiomegaly or pericardial effusion. The thoracic aorta is unremarkable. The origins of the great vessels of the aortic arch appear patent. Faint linear hyperdensity along the left lateral wall of the pulmonary trunk (56/6) likely artifactual. No pulmonary artery embolus identified. Mediastinum/Nodes: There is no hilar or mediastinal adenopathy. The esophagus is grossly unremarkable. No mediastinal fluid collection. Lungs/Pleura: Bibasilar linear atelectasis/scarring. No focal consolidation, pleural effusion, or pneumothorax. The central airways are patent. Musculoskeletal: No chest wall abnormality. No acute or significant osseous findings. Review of the MIP images confirms the above findings. CT ABDOMEN and PELVIS FINDINGS No intra-abdominal free air.  Small ascites. Hepatobiliary: Severe fatty liver. The liver is enlarged measuring 19 cm in midclavicular length. Correlation with clinical exam and LFTs  recommended to evaluate for steatohepatitis. No intrahepatic biliary ductal dilatation. The gallbladder is predominantly contracted. No calcified gallstone. Pancreas: Unremarkable. No pancreatic ductal dilatation or surrounding inflammatory changes. Spleen: Normal in size without focal abnormality. Adrenals/Urinary Tract: The adrenal glands are unremarkable. There is no hydronephrosis on either side. The visualized ureters appear unremarkable. The urinary bladder is minimally distended and grossly unremarkable. Stomach/Bowel: There is no bowel obstruction or active inflammation. The appendix is normal. Vascular/Lymphatic: The abdominal aorta and IVC are unremarkable. No portal venous gas. There is no adenopathy. Reproductive: The uterus is anteverted and grossly unremarkable. No adnexal masses. Other: Mild diffuse subcutaneous edema. Musculoskeletal: No acute or significant osseous findings. Review of the MIP images confirms the above findings. IMPRESSION: 1. No acute intrathoracic pathology. No CT evidence of  pulmonary embolism. 2. Severe fatty liver with findings of possible steatohepatitis. Clinical correlation is recommended. 3. Small ascites. 4. No bowel obstruction. Normal appendix. Electronically Signed   By: Anner Crete M.D.   On: 06/02/2020 21:26    Procedures Procedures   Medications Ordered in ED Medications  lactated ringers bolus 1,000 mL (has no administration in time range)  sodium chloride 0.9 % bolus 1,000 mL (0 mLs Intravenous Stopped 06/02/20 2135)  fentaNYL (SUBLIMAZE) injection 12.5 mcg (12.5 mcg Intravenous Given 06/02/20 2002)  iohexol (OMNIPAQUE) 350 MG/ML injection 100 mL (100 mLs Intravenous Contrast Given 06/02/20 2112)  sodium chloride 0.9 % bolus 1,000 mL (0 mLs Intravenous Stopped 06/02/20 2337)    ED Course  I have reviewed the triage vital signs and the nursing notes.  Pertinent labs & imaging results that were available during my care of the patient were reviewed by me and considered in my medical decision making (see chart for details).  Clinical Course as of 06/03/20 0021  Mon Jun 02, 2020  1648 AST(!): 118 [HK]  1648 Alkaline Phosphatase(!): 169 [HK]  1648 Total Bilirubin(!): 1.3 [HK]  1648 Hemoglobin(!): 9.6 [HK]  1648 Platelets(!): 61 [HK]  1648 LFTs have improved since January. Thrombocytopenia has worsened. [HK]  7510 Fecal Occult Blood, POC: NEGATIVE [HK]  2585 Patient remains tachycardic despite appearing comfortable and receiving IV fluids.  Upon further evaluation she does state that she has ongoing shortness of breath for the past several weeks but states that "it only happens when I am tired."  Unsure if patient is a reliable historian but she is persistently tachycardic up to the 130s during her entire ED visit.  Due to this tachycardia and complaints of shortness of breath, will obtain EKG as well as PE study. [HK]  2241 Pulse Rate(!): 111 [HK]  2354 Pulse Rate(!): 118 [HK]    Clinical Course User Index [HK] Delia Heady, PA-C   MDM  Rules/Calculators/A&P                          20 year old female with past medical history of hypothyroidism, RA not currently on medications presenting to the ED with a chief complaint of abdominal pain and black stools.  On 05/29/2020 started experiencing worsening pain related to her RA.  She took 1 dose of Omega XL in hopes that this would help with her swelling.  This was her first time taking this medication and she took this without any recommendation from a healthcare provider.  Shortly thereafter started experiencing nausea, vomiting, abdominal pain and "black mucus" in her stools.  She denies any bright red blood or blood when she wipes the area.  She has  not been on medication for her RA in the past 2 months, last was on Rinvoq this was discontinued in January of this year after she was found to have transaminitis related to this.  Denies any chest pain and then subsequently inform me that she has had shortness of breath that has been chronic for several months as well as being told her heart rate is high "only when I'm tired. But that's why I'm going to see the GI doctor, to get everything checked out."  On exam abdomen is tender in the periumbilical area.  DRE revealed brown stool without any evidence of tenderness or external abnormalities.  Patient tachycardic to 120s on arrival here.  Hemoglobin of 9.6 which is similar to her priors.  She does have thrombocytopenia with platelet count of 61 which has gradually declined since January.  Lipase is normal.  hCG is negative.  Hemoccult is negative.  LFTs are slightly elevated but improved from her prior visits earlier this year.  CT of the chest abdomen pelvis shows findings consistent with severe fatty liver disease without evidence of bowel obstruction, appendicitis or PE.  Patient remains persistently tachycardic here although this has slightly improved with IV fluids.  Will attempt to treat with another liter and reassess.  12:12 AM Patient  remains persistently tachycardic to 110s despite 2L fluids.  She states that her TSH was checked a few months ago and has been taking her levothyroxine regularly.  TSH is pending. Will obtain INR as well. Due to patient's persistent tachycardia as well as her liver disease seen on imaging feel that admission is warranted at this time. She has not had previous GI consultation during her admission in January as her transaminitis was thought to be due to her Rinvoq. Will admit to hospitalist service as she has been persistently tachycardic during her 10-hour ED stay.  Care handed off to oncoming provider pending remainder of workup and disposition.    Portions of this note were generated with Lobby[employer]. Dictation errors may occur despite best attempts at proofreading.  Final Clinical Impression(s) / ED Diagnoses Final diagnoses:  Tachycardia  Thrombocytopenia (HCC)  Hepatic steatosis  SIRS (systemic inflammatory response syndrome) [facility])    Rx / DC Orders ED Discharge Orders    None          Delia Heady, PA-C 06/03/20 0022    Lucrezia Starch, MD 06/03/20 [phone]

## 2020-06-02 NOTE — ED Notes (Signed)
Patient transported to CT 

## 2020-06-03 ENCOUNTER — Encounter (HOSPITAL_COMMUNITY): Payer: Self-pay | Admitting: Internal Medicine

## 2020-06-03 ENCOUNTER — Emergency Department (HOSPITAL_COMMUNITY): Payer: 59

## 2020-06-03 ENCOUNTER — Inpatient Hospital Stay (HOSPITAL_COMMUNITY): Payer: 59

## 2020-06-03 DIAGNOSIS — M069 Rheumatoid arthritis, unspecified: Secondary | ICD-10-CM

## 2020-06-03 DIAGNOSIS — R64 Cachexia: Secondary | ICD-10-CM | POA: Diagnosis present

## 2020-06-03 DIAGNOSIS — M0609 Rheumatoid arthritis without rheumatoid factor, multiple sites: Secondary | ICD-10-CM | POA: Diagnosis not present

## 2020-06-03 DIAGNOSIS — Z008 Encounter for other general examination: Secondary | ICD-10-CM | POA: Diagnosis not present

## 2020-06-03 DIAGNOSIS — E43 Unspecified severe protein-calorie malnutrition: Secondary | ICD-10-CM | POA: Diagnosis present

## 2020-06-03 DIAGNOSIS — S06360A Traumatic hemorrhage of cerebrum, unspecified, without loss of consciousness, initial encounter: Secondary | ICD-10-CM | POA: Diagnosis not present

## 2020-06-03 DIAGNOSIS — Z79890 Hormone replacement therapy (postmenopausal): Secondary | ICD-10-CM | POA: Diagnosis not present

## 2020-06-03 DIAGNOSIS — D688 Other specified coagulation defects: Secondary | ICD-10-CM

## 2020-06-03 DIAGNOSIS — I609 Nontraumatic subarachnoid hemorrhage, unspecified: Secondary | ICD-10-CM | POA: Diagnosis not present

## 2020-06-03 DIAGNOSIS — S066X9A Traumatic subarachnoid hemorrhage with loss of consciousness of unspecified duration, initial encounter: Secondary | ICD-10-CM | POA: Diagnosis present

## 2020-06-03 DIAGNOSIS — K7469 Other cirrhosis of liver: Secondary | ICD-10-CM | POA: Diagnosis present

## 2020-06-03 DIAGNOSIS — E0781 Sick-euthyroid syndrome: Secondary | ICD-10-CM | POA: Diagnosis present

## 2020-06-03 DIAGNOSIS — Y92238 Other place in hospital as the place of occurrence of the external cause: Secondary | ICD-10-CM | POA: Diagnosis present

## 2020-06-03 DIAGNOSIS — I619 Nontraumatic intracerebral hemorrhage, unspecified: Secondary | ICD-10-CM | POA: Diagnosis present

## 2020-06-03 DIAGNOSIS — K7581 Nonalcoholic steatohepatitis (NASH): Secondary | ICD-10-CM | POA: Diagnosis present

## 2020-06-03 DIAGNOSIS — E8809 Other disorders of plasma-protein metabolism, not elsewhere classified: Secondary | ICD-10-CM | POA: Diagnosis not present

## 2020-06-03 DIAGNOSIS — M0600 Rheumatoid arthritis without rheumatoid factor, unspecified site: Secondary | ICD-10-CM | POA: Diagnosis present

## 2020-06-03 DIAGNOSIS — R5084 Febrile nonhemolytic transfusion reaction: Secondary | ICD-10-CM | POA: Diagnosis present

## 2020-06-03 DIAGNOSIS — E872 Acidosis: Secondary | ICD-10-CM | POA: Diagnosis not present

## 2020-06-03 DIAGNOSIS — F32A Depression, unspecified: Secondary | ICD-10-CM | POA: Diagnosis present

## 2020-06-03 DIAGNOSIS — S069X0S Unspecified intracranial injury without loss of consciousness, sequela: Secondary | ICD-10-CM | POA: Diagnosis not present

## 2020-06-03 DIAGNOSIS — S069X0A Unspecified intracranial injury without loss of consciousness, initial encounter: Secondary | ICD-10-CM | POA: Diagnosis not present

## 2020-06-03 DIAGNOSIS — K759 Inflammatory liver disease, unspecified: Secondary | ICD-10-CM | POA: Insufficient documentation

## 2020-06-03 DIAGNOSIS — D649 Anemia, unspecified: Secondary | ICD-10-CM | POA: Diagnosis not present

## 2020-06-03 DIAGNOSIS — E876 Hypokalemia: Secondary | ICD-10-CM | POA: Diagnosis not present

## 2020-06-03 DIAGNOSIS — R6510 Systemic inflammatory response syndrome (SIRS) of non-infectious origin without acute organ dysfunction: Secondary | ICD-10-CM | POA: Diagnosis not present

## 2020-06-03 DIAGNOSIS — D539 Nutritional anemia, unspecified: Secondary | ICD-10-CM | POA: Diagnosis present

## 2020-06-03 DIAGNOSIS — R609 Edema, unspecified: Secondary | ICD-10-CM | POA: Diagnosis not present

## 2020-06-03 DIAGNOSIS — D696 Thrombocytopenia, unspecified: Secondary | ICD-10-CM | POA: Diagnosis present

## 2020-06-03 DIAGNOSIS — Z6820 Body mass index (BMI) 20.0-20.9, adult: Secondary | ICD-10-CM | POA: Diagnosis not present

## 2020-06-03 DIAGNOSIS — K760 Fatty (change of) liver, not elsewhere classified: Secondary | ICD-10-CM

## 2020-06-03 DIAGNOSIS — D61818 Other pancytopenia: Secondary | ICD-10-CM

## 2020-06-03 DIAGNOSIS — E039 Hypothyroidism, unspecified: Secondary | ICD-10-CM | POA: Diagnosis present

## 2020-06-03 DIAGNOSIS — D65 Disseminated intravascular coagulation [defibrination syndrome]: Secondary | ICD-10-CM | POA: Diagnosis present

## 2020-06-03 DIAGNOSIS — D689 Coagulation defect, unspecified: Secondary | ICD-10-CM

## 2020-06-03 DIAGNOSIS — R339 Retention of urine, unspecified: Secondary | ICD-10-CM | POA: Diagnosis not present

## 2020-06-03 DIAGNOSIS — S06369A Traumatic hemorrhage of cerebrum, unspecified, with loss of consciousness of unspecified duration, initial encounter: Secondary | ICD-10-CM | POA: Diagnosis present

## 2020-06-03 DIAGNOSIS — W010XXA Fall on same level from slipping, tripping and stumbling without subsequent striking against object, initial encounter: Secondary | ICD-10-CM | POA: Diagnosis present

## 2020-06-03 DIAGNOSIS — S065X9A Traumatic subdural hemorrhage with loss of consciousness of unspecified duration, initial encounter: Secondary | ICD-10-CM | POA: Diagnosis present

## 2020-06-03 DIAGNOSIS — S069X1S Unspecified intracranial injury with loss of consciousness of 30 minutes or less, sequela: Secondary | ICD-10-CM | POA: Diagnosis not present

## 2020-06-03 DIAGNOSIS — C9240 Acute promyelocytic leukemia, not having achieved remission: Secondary | ICD-10-CM | POA: Diagnosis not present

## 2020-06-03 DIAGNOSIS — Z20822 Contact with and (suspected) exposure to covid-19: Secondary | ICD-10-CM | POA: Diagnosis present

## 2020-06-03 LAB — CBC
HCT: 22.2 % — ABNORMAL LOW (ref 36.0–46.0)
HCT: 23.1 % — ABNORMAL LOW (ref 36.0–46.0)
Hemoglobin: 7 g/dL — ABNORMAL LOW (ref 12.0–15.0)
Hemoglobin: 7.4 g/dL — ABNORMAL LOW (ref 12.0–15.0)
MCH: 31 pg (ref 26.0–34.0)
MCH: 30.7 pg (ref 26.0–34.0)
MCHC: 31.5 g/dL (ref 30.0–36.0)
MCHC: 32 g/dL (ref 30.0–36.0)
MCV: 98.2 fL (ref 80.0–100.0)
MCV: 95.9 fL (ref 80.0–100.0)
Platelets: 43 10*3/uL — ABNORMAL LOW (ref 150–400)
Platelets: 61 10*3/uL — ABNORMAL LOW (ref 150–400)
RBC: 2.26 MIL/uL — ABNORMAL LOW (ref 3.87–5.11)
RBC: 2.41 MIL/uL — ABNORMAL LOW (ref 3.87–5.11)
RDW: 17.2 % — ABNORMAL HIGH (ref 11.5–15.5)
RDW: 17 % — ABNORMAL HIGH (ref 11.5–15.5)
WBC: 2.6 10*3/uL — ABNORMAL LOW (ref 4.0–10.5)
WBC: 2.4 10*3/uL — ABNORMAL LOW (ref 4.0–10.5)
nRBC: 0.8 % — ABNORMAL HIGH (ref 0.0–0.2)
nRBC: 0 % (ref 0.0–0.2)

## 2020-06-03 LAB — CBG MONITORING, ED: Glucose-Capillary: 68 mg/dL — ABNORMAL LOW (ref 70–99)

## 2020-06-03 LAB — RETICULOCYTES
Immature Retic Fract: 17.3 % — ABNORMAL HIGH (ref 2.3–15.9)
RBC.: 2.29 MIL/uL — ABNORMAL LOW (ref 3.87–5.11)
Retic Count, Absolute: 73.7 10*3/uL (ref 19.0–186.0)
Retic Ct Pct: 3.2 % — ABNORMAL HIGH (ref 0.4–3.1)

## 2020-06-03 LAB — COMPREHENSIVE METABOLIC PANEL
ALT: 27 U/L (ref 0–44)
AST: 84 U/L — ABNORMAL HIGH (ref 15–41)
Albumin: 1.2 g/dL — ABNORMAL LOW (ref 3.5–5.0)
Alkaline Phosphatase: 120 U/L (ref 38–126)
Anion gap: 6 (ref 5–15)
BUN: <5 mg/dL — ABNORMAL LOW (ref 6–20)
CO2: 20 mmol/L — ABNORMAL LOW (ref 22–32)
Calcium: 6.8 mg/dL — ABNORMAL LOW (ref 8.9–10.3)
Chloride: 108 mmol/L (ref 98–111)
Creatinine, Ser: 0.46 mg/dL (ref 0.44–1.00)
GFR, Estimated: >60 mL/min (ref >60)
Glucose, Bld: 74 mg/dL (ref 70–99)
Potassium: 3.1 mmol/L — ABNORMAL LOW (ref 3.5–5.1)
Sodium: 134 mmol/L — ABNORMAL LOW (ref 135–145)
Total Bilirubin: 1 mg/dL (ref 0.3–1.2)
Total Protein: 4.9 g/dL — ABNORMAL LOW (ref 6.5–8.1)

## 2020-06-03 LAB — PROTIME-INR
INR: 2.6 — ABNORMAL HIGH (ref 0.8–1.2)
INR: 2.6 — ABNORMAL HIGH (ref 0.8–1.2)
INR: 1.3 — ABNORMAL HIGH (ref 0.8–1.2)
Prothrombin Time: 27.2 seconds — ABNORMAL HIGH (ref 11.4–15.2)
Prothrombin Time: 26.8 seconds — ABNORMAL HIGH (ref 11.4–15.2)
Prothrombin Time: 15.3 seconds — ABNORMAL HIGH (ref 11.4–15.2)

## 2020-06-03 LAB — DIFFERENTIAL
Abs Immature Granulocytes: 0.04 10*3/uL (ref 0.00–0.07)
Basophils Absolute: 0 10*3/uL (ref 0.0–0.1)
Basophils Relative: 0 %
Eosinophils Absolute: 0 10*3/uL (ref 0.0–0.5)
Eosinophils Relative: 0 %
Immature Granulocytes: 2 %
Lymphocytes Relative: 26 %
Lymphs Abs: 0.7 10*3/uL (ref 0.7–4.0)
Monocytes Absolute: 0.1 10*3/uL (ref 0.1–1.0)
Monocytes Relative: 4 %
Neutro Abs: 1.8 10*3/uL (ref 1.7–7.7)
Neutrophils Relative %: 68 %

## 2020-06-03 LAB — GLOBAL TEG PANEL
CFF Max Amplitude: <4 mm — ABNORMAL LOW (ref 15–32)
CK with Heparinase (R): 9.1 min — ABNORMAL HIGH (ref 4.3–8.3)
Citrated Kaolin (K): >5 min — ABNORMAL HIGH (ref 0.8–2.1)
Citrated Kaolin (MA): <40 mm — ABNORMAL LOW (ref 52–69)
Citrated Kaolin (R): 8.2 min (ref 4.6–9.1)
Citrated Kaolin Angle: 54 deg — ABNORMAL LOW (ref 63–78)
Citrated Rapid TEG (MA): <40 mm — ABNORMAL LOW (ref 52–70)

## 2020-06-03 LAB — ALKALINE PHOSPHATASE: Alkaline Phosphatase: 127 U/L — ABNORMAL HIGH (ref 38–126)

## 2020-06-03 LAB — GLUCOSE, CAPILLARY
Glucose-Capillary: 69 mg/dL — ABNORMAL LOW (ref 70–99)
Glucose-Capillary: 78 mg/dL (ref 70–99)
Glucose-Capillary: 70 mg/dL (ref 70–99)

## 2020-06-03 LAB — C-REACTIVE PROTEIN: CRP: 4.5 mg/dL — ABNORMAL HIGH (ref <1.0)

## 2020-06-03 LAB — SARS CORONAVIRUS 2 (TAT 6-24 HRS): SARS Coronavirus 2: NEGATIVE

## 2020-06-03 LAB — ABO/RH: ABO/RH(D): O POS

## 2020-06-03 LAB — FIBRINOGEN
Fibrinogen: <60 mg/dL — CL (ref 210–475)
Fibrinogen: 164 mg/dL — ABNORMAL LOW (ref 210–475)

## 2020-06-03 LAB — TSH: TSH: 4.769 u[IU]/mL — ABNORMAL HIGH (ref 0.350–4.500)

## 2020-06-03 LAB — IMMATURE PLATELET FRACTION: Immature Platelet Fraction: 10.2 % — ABNORMAL HIGH (ref 1.2–8.6)

## 2020-06-03 LAB — D-DIMER, QUANTITATIVE: D-Dimer, Quant: 4.27 ug/mL-FEU — ABNORMAL HIGH (ref 0.00–0.50)

## 2020-06-03 LAB — CK: Total CK: 29 U/L — ABNORMAL LOW (ref 38–234)

## 2020-06-03 LAB — VITAMIN B12: Vitamin B-12: 1371 pg/mL — ABNORMAL HIGH (ref 180–914)

## 2020-06-03 LAB — AMYLASE: Amylase: 21 U/L — ABNORMAL LOW (ref 28–100)

## 2020-06-03 LAB — T4, FREE: Free T4: 0.93 ng/dL (ref 0.61–1.12)

## 2020-06-03 LAB — SAVE SMEAR(SSMR), FOR PROVIDER SLIDE REVIEW

## 2020-06-03 LAB — SEDIMENTATION RATE: Sed Rate: 30 mm/hr — ABNORMAL HIGH (ref 0–22)

## 2020-06-03 LAB — HEMOGLOBIN A1C
Hgb A1c MFr Bld: 4.8 % (ref 4.8–5.6)
Mean Plasma Glucose: 91.06 mg/dL

## 2020-06-03 LAB — APTT: aPTT: 78 seconds — ABNORMAL HIGH (ref 24–36)

## 2020-06-03 LAB — LACTATE DEHYDROGENASE: LDH: 397 U/L — ABNORMAL HIGH (ref 98–192)

## 2020-06-03 LAB — FOLATE: Folate: 6.9 ng/mL (ref >5.9)

## 2020-06-03 MED ORDER — POTASSIUM CHLORIDE 20 MEQ PO PACK: 40.0000 meq | PACK | Freq: Once | ORAL | Status: DC

## 2020-06-03 MED ORDER — POTASSIUM CHLORIDE 20 MEQ PO PACK
40.0000 meq | PACK | ORAL | Status: AC
Start: 2020-06-03 — End: 2020-06-03
  Administered 2020-06-03 (×2): 40 meq via ORAL
  Filled 2020-06-03 (×2): qty 2

## 2020-06-03 MED ORDER — SODIUM CHLORIDE 0.9 % IV SOLN
10.0000 mL/h | Freq: Once | INTRAVENOUS | Status: AC
  Administered 2020-06-03: 10 mL/h via INTRAVENOUS

## 2020-06-03 MED ORDER — POLYETHYLENE GLYCOL 3350 17 G PO PACK
17.0000 g | PACK | Freq: Every day | ORAL | Status: DC | PRN
Start: 2020-06-03 — End: 2020-06-14
  Administered 2020-06-10: 17 g via ORAL
  Filled 2020-06-03: qty 1

## 2020-06-03 MED ORDER — SODIUM CHLORIDE 0.9% IV SOLUTION: Freq: Once | INTRAVENOUS | Status: DC

## 2020-06-03 MED ORDER — FENTANYL CITRATE (PF) 100 MCG/2ML IJ SOLN
12.5000 ug | Freq: Once | INTRAMUSCULAR | Status: AC
  Administered 2020-06-03: 12.5 ug via INTRAVENOUS
  Filled 2020-06-03: qty 2

## 2020-06-03 MED ORDER — LACTATED RINGERS IV BOLUS
1000.0000 mL | Freq: Once | INTRAVENOUS | Status: AC
  Administered 2020-06-03: 1000 mL via INTRAVENOUS

## 2020-06-03 MED ORDER — PREDNISONE 20 MG PO TABS
10.0000 mg | ORAL_TABLET | Freq: Every day | ORAL | Status: DC
  Administered 2020-06-03: 10 mg via ORAL
  Filled 2020-06-03: qty 1

## 2020-06-03 MED ORDER — PHYTONADIONE 5 MG PO TABS
10.0000 mg | ORAL_TABLET | Freq: Every day | ORAL | Status: DC
  Filled 2020-06-03 (×2): qty 2

## 2020-06-03 MED ORDER — METOCLOPRAMIDE HCL 5 MG/ML IJ SOLN
10.0000 mg | INTRAMUSCULAR | Status: AC
  Administered 2020-06-03: 10 mg via INTRAVENOUS
  Filled 2020-06-03: qty 2

## 2020-06-03 MED ORDER — CHLORHEXIDINE GLUCONATE CLOTH 2 % EX PADS
6.0000 | MEDICATED_PAD | Freq: Every day | CUTANEOUS | Status: DC
  Administered 2020-06-04 – 2020-06-16 (×9): 6 via TOPICAL

## 2020-06-03 MED ORDER — TRETINOIN 10 MG PO CAPS: 30.0000 mg | ORAL_CAPSULE | Freq: Two times a day (BID) | ORAL | Status: DC

## 2020-06-03 MED ORDER — ACETAMINOPHEN 650 MG RE SUPP
650.0000 mg | Freq: Once | RECTAL | Status: AC
  Administered 2020-06-03: 650 mg via RECTAL
  Filled 2020-06-03: qty 1

## 2020-06-03 MED ORDER — VITAMIN K1 10 MG/ML IJ SOLN
5.0000 mg | Freq: Once | INTRAVENOUS | Status: AC
  Administered 2020-06-03: 5 mg via INTRAVENOUS
  Filled 2020-06-03: qty 0.5

## 2020-06-03 MED ORDER — DIPHENHYDRAMINE HCL 50 MG/ML IJ SOLN
12.5000 mg | Freq: Four times a day (QID) | INTRAMUSCULAR | Status: DC | PRN
  Administered 2020-06-10: 12.5 mg via INTRAVENOUS
  Filled 2020-06-03: qty 1

## 2020-06-03 MED ORDER — DOCUSATE SODIUM 100 MG PO CAPS
100.0000 mg | ORAL_CAPSULE | Freq: Two times a day (BID) | ORAL | Status: DC | PRN
  Administered 2020-06-09 – 2020-06-10 (×2): 100 mg via ORAL
  Filled 2020-06-03 (×2): qty 1

## 2020-06-03 MED ORDER — LACTATED RINGERS IV SOLN: INTRAVENOUS | Status: DC

## 2020-06-03 MED ORDER — POTASSIUM CHLORIDE 10 MEQ/100ML IV SOLN
10.0000 meq | INTRAVENOUS | Status: DC
Start: 2020-06-03 — End: 2020-06-03
  Administered 2020-06-03 (×2): 10 meq via INTRAVENOUS
  Filled 2020-06-03 (×2): qty 100

## 2020-06-03 MED ORDER — FENTANYL CITRATE (PF) 100 MCG/2ML IJ SOLN
50.0000 ug | INTRAMUSCULAR | Status: DC | PRN
  Administered 2020-06-05: 50 ug via INTRAVENOUS
  Filled 2020-06-03: qty 2

## 2020-06-03 MED ORDER — TRETINOIN 10 MG PO CAPS
30.0000 mg | ORAL_CAPSULE | Freq: Two times a day (BID) | ORAL | Status: DC
  Administered 2020-06-03 – 2020-06-04 (×2): 30 mg via ORAL
  Filled 2020-06-03 (×3): qty 3

## 2020-06-03 MED ORDER — PANTOPRAZOLE SODIUM 40 MG IV SOLR
40.0000 mg | Freq: Two times a day (BID) | INTRAVENOUS | Status: DC
  Administered 2020-06-03 – 2020-06-05 (×6): 40 mg via INTRAVENOUS
  Filled 2020-06-03 (×7): qty 40

## 2020-06-03 MED ORDER — PREDNISONE 20 MG PO TABS
20.0000 mg | ORAL_TABLET | Freq: Two times a day (BID) | ORAL | Status: DC
  Administered 2020-06-03 – 2020-06-04 (×2): 20 mg via ORAL
  Filled 2020-06-03 (×2): qty 1

## 2020-06-03 MED ORDER — EPINEPHRINE 1 MG/10ML IJ SOSY: 0.2000 mg | PREFILLED_SYRINGE | Freq: Once | INTRAMUSCULAR | Status: DC | PRN

## 2020-06-03 MED ORDER — INSULIN ASPART 100 UNIT/ML ~~LOC~~ SOLN: 0.0000 [IU] | SUBCUTANEOUS | Status: DC

## 2020-06-03 MED ORDER — TRETINOIN 10 MG PO CAPS
30.0000 mg | ORAL_CAPSULE | Freq: Two times a day (BID) | ORAL | Status: DC
  Filled 2020-06-03: qty 3

## 2020-06-03 NOTE — ED Provider Notes (Signed)
12:35 AM Care assumed at shift change from [place], [place].  In short, patient is a 20 year old female with a history of hypothyroid, RA who presents for nausea, vomiting, periumbilical abdominal pain after taking Omega XL.  Has also noted some "black mucus" in her stool.  Her evaluation in the ED has been generally reassuring with stable hemoglobin.  Hemoccult is negative.  Mild hyponatremia.  She does have some residual elevation of LFTs compared to an admission in January.  These continue to trend towards normal.  She has received 2 L IV fluids and continues to experience tachycardia with a rate in the 120s.  EKG consistent with sinus tachycardia.  Blood pressure has remained stable, but does technically meet SIRS criteria.  Consult was placed for unassigned medical admission.  Dr. Posey Pronto feels that the patient requires further fluid hydration in the emergency department rather than observation inpatient.  Will give additional IVF; lactated ringers ordered.  1:01 AM Notified by RN that patient was screaming from the bathroom.  Was attended to by staff and reported that she fell and struck her head.  RN did not hear the fall, only patient screaming. It is unclear how long she many have been unconscious for and what she struck her head on.   I assessed the patient at bedside.  Patient c/o some spots in her vision bilaterally as well as a headache.  She was found to have a large hematoma to her left parietal scalp.  This is approximately 5 cm in diameter.  There is no appreciable underlying skull instability.  No battle sign or raccoon's eyes.  She has equal pupils bilaterally, equal grip strength.  Is able to carry on a meaningful conversation.  Did assist in transitioning herself from the wheelchair to her bed.  CT ordered to assess for skull fracture.  Will be difficult to determine intracranial abnormality as she recently received IV contrast for her abdominal CT.  RN given verbal instruction to repeat  EKG.  Will give medications for pain and nausea.  Patient has been instructed not to get out of bed without assistance.  Plan to repeat admission consult after CT resulted.  1:42 AM Received call from radiology, MD [place], about head CT. Imaging positive for multifocal acute subarachnoid hemorrhage; large L parietal hematoma without skull fx. Will discuss with Neurosurgery.   1:52 AM Patient updated on results of imaging.  She states that her vision changes have resolved.  Her headache feels as though it is easing off.  2:26 AM  Spoke with Reinaldo Meeker, PA-C of the neurosurgical service who has reviewed the patient's CT imaging.  Reports no indication for neurosurgical intervention as amount of blood is small.  Would advise continued neurologic examinations and repeat imaging only if patient noted to have an acute neurologic change.  Reinaldo Meeker, PA-C advising admission to medical service.  Repeat consult placed to unassigned.  2:41 AM Case discussed with Dr. Alcario Drought. Tachycardia is improved. Does not feel the patient warrants admission from a medical perspective. Will assess patient at bedside in consultation.  3:04 AM Case discussed, again, with Reinaldo Meeker, PA-C of neurosurgery.  They will evaluate the patient later this morning and formal consultation to finalize disposition.  Potential discharge if neurosurgically cleared at time of consult.  3:19 AM INR resulted at 2.6. Ordered Neuro checks q 1 hour.  3:29 AM Patient reassessed. Resting comfortably. No focal deficits on repeat neurologic exam. This remains stable.   Ordered to transfuse 3 Units FFP and 5mg  IV  Vitamin K given elevated INR and hx of LFT elevation in January, suggested steatohepatitis on CT.  4:27 AM Reinaldo Meeker, PA-C aware of INR results. No additional recommendations at this time.  5:06 AM Patient requesting additional medications for pain.  12.5 mcg fentanyl IV ordered.  5:25 AM Patient developed fever of 102.3F on reassessment  by RN following initiation of FFP. RN given orders to d/c FFP transfusion, presuming transfusion reaction. Unable to exclude that [facility] is contributing to fever onset. Tylenol ordered. Patient with no c/o itching, rash, SOB, difficulty swallowing.  5:33 AM Patient reassessed; clinically stable. Infusion has been discontinued. Resting comfortably. Lungs CTAB. No stridor or hypoxia. Tolerating secretions. No angioedema. No urticaria.  6:38 AM Care signed out to [facility], PA-C at shift change pending neurosurgical consult.   Results for orders placed or performed during the hospital encounter of 06/02/20  Lipase, blood  Result Value Ref Range   Lipase 42 11 - 51 U/L  Comprehensive metabolic panel  Result Value Ref Range   Sodium 129 (L) 135 - 145 mmol/L   Potassium 3.4 (L) 3.5 - 5.1 mmol/L   Chloride 98 98 - 111 mmol/L   CO2 23 22 - 32 mmol/L   Glucose, Bld 79 70 - 99 mg/dL   BUN <5 (L) 6 - 20 mg/dL   Creatinine, Ser 0.50 0.44 - 1.00 mg/dL   Calcium 7.2 (L) 8.9 - 10.3 mg/dL   Total Protein 6.6 6.5 - 8.1 g/dL   Albumin 1.6 (L) 3.5 - 5.0 g/dL   AST 118 (H) 15 - 41 U/L   ALT 40 0 - 44 U/L   Alkaline Phosphatase 169 (H) 38 - 126 U/L   Total Bilirubin 1.3 (H) 0.3 - 1.2 mg/dL   GFR, Estimated >60 >60 mL/min   Anion gap 8 5 - 15  CBC  Result Value Ref Range   WBC 5.1 4.0 - 10.5 K/uL   RBC 3.11 (L) 3.87 - 5.11 MIL/uL   Hemoglobin 9.6 (L) 12.0 - 15.0 g/dL   HCT 30.1 (L) 36.0 - 46.0 %   MCV 96.8 80.0 - 100.0 fL   MCH 30.9 26.0 - 34.0 pg   MCHC 31.9 30.0 - 36.0 g/dL   RDW 16.9 (H) 11.5 - 15.5 %   Platelets 61 (L) 150 - 400 K/uL   nRBC 0.4 (H) 0.0 - 0.2 %  Urinalysis, Routine w reflex microscopic Urine, Clean Catch  Result Value Ref Range   Color, Urine AMBER (A) YELLOW   APPearance HAZY (A) CLEAR   Specific Gravity, Urine 1.018 1.005 - 1.030   pH 6.0 5.0 - 8.0   Glucose, UA NEGATIVE NEGATIVE mg/dL   Hgb urine dipstick NEGATIVE NEGATIVE   Bilirubin Urine NEGATIVE NEGATIVE    Ketones, ur NEGATIVE NEGATIVE mg/dL   Protein, ur 30 (A) NEGATIVE mg/dL   Nitrite NEGATIVE NEGATIVE   Leukocytes,Ua NEGATIVE NEGATIVE   RBC / HPF 0-5 0 - 5 RBC/hpf   WBC, UA 0-5 0 - 5 WBC/hpf   Bacteria, UA RARE (A) NONE SEEN   Squamous Epithelial / LPF 0-5 0 - 5   Mucus PRESENT   TSH  Result Value Ref Range   TSH 4.769 (H) 0.350 - 4.500 uIU/mL  I-Stat beta hCG blood, ED  Result Value Ref Range   I-stat hCG, quantitative <5.0 <5 mIU/mL   Comment 3          POC occult blood, ED RN will collect  Result Value Ref Range   Fecal  Occult Bld NEGATIVE NEGATIVE  Type and screen [place]  Result Value Ref Range   ABO/RH(D) O POS    Antibody Screen NEG    Sample Expiration      06/05/2020,2359 Performed at [facility], [place] [address], [place], [place] [address]    CT Head Wo Contrast  Result Date: 06/03/2020 CLINICAL DATA:  Head trauma EXAM: CT HEAD WITHOUT CONTRAST TECHNIQUE: Contiguous axial images were obtained from the base of the skull through the vertex without intravenous contrast. COMPARISON:  None. FINDINGS: Brain: Multifocal acute subarachnoid blood, greatest over the superior parietal lobes. No midline shift or other mass effect. Vascular: No abnormal hyperdensity of the major intracranial arteries or dural venous sinuses. No intracranial atherosclerosis. Skull: Large left parietal scalp hematoma without skull fracture. Sinuses/Orbits: No fluid levels or advanced mucosal thickening of the visualized paranasal sinuses. No mastoid or middle ear effusion. The orbits are normal. IMPRESSION: 1. Multifocal acute subarachnoid blood, greatest over the superior parietal lobes. 2. Large left parietal scalp hematoma without skull fracture. Critical Value/emergent results were called by telephone at the time of interpretation on 06/03/2020 at 1:41 am to provider [facility] , who verbally acknowledged these results. Electronically Signed   By: Ulyses Jarred M.D.   On:  06/03/2020 01:45   CT Angio Chest PE W/Cm &/Or Wo Cm  Result Date: 06/02/2020 CLINICAL DATA:  20 year old female with abdominal pain and concern for pulmonary embolism. EXAM: CT ANGIOGRAPHY CHEST CT ABDOMEN AND PELVIS WITH CONTRAST TECHNIQUE: Multidetector CT imaging of the chest was performed using the standard protocol during bolus administration of intravenous contrast. Multiplanar CT image reconstructions and MIPs were obtained to evaluate the vascular anatomy. Multidetector CT imaging of the abdomen and pelvis was performed using the standard protocol during bolus administration of intravenous contrast. CONTRAST:  122mL OMNIPAQUE IOHEXOL 350 MG/ML SOLN COMPARISON:  Abdominal ultrasound dated 05/05/2020. FINDINGS: CTA CHEST FINDINGS Cardiovascular: There is no cardiomegaly or pericardial effusion. The thoracic aorta is unremarkable. The origins of the great vessels of the aortic arch appear patent. Faint linear hyperdensity along the left lateral wall of the pulmonary trunk (56/6) likely artifactual. No pulmonary artery embolus identified. Mediastinum/Nodes: There is no hilar or mediastinal adenopathy. The esophagus is grossly unremarkable. No mediastinal fluid collection. Lungs/Pleura: Bibasilar linear atelectasis/scarring. No focal consolidation, pleural effusion, or pneumothorax. The central airways are patent. Musculoskeletal: No chest wall abnormality. No acute or significant osseous findings. Review of the MIP images confirms the above findings. CT ABDOMEN and PELVIS FINDINGS No intra-abdominal free air.  Small ascites. Hepatobiliary: Severe fatty liver. The liver is enlarged measuring 19 cm in midclavicular length. Correlation with clinical exam and LFTs recommended to evaluate for steatohepatitis. No intrahepatic biliary ductal dilatation. The gallbladder is predominantly contracted. No calcified gallstone. Pancreas: Unremarkable. No pancreatic ductal dilatation or surrounding inflammatory changes.  Spleen: Normal in size without focal abnormality. Adrenals/Urinary Tract: The adrenal glands are unremarkable. There is no hydronephrosis on either side. The visualized ureters appear unremarkable. The urinary bladder is minimally distended and grossly unremarkable. Stomach/Bowel: There is no bowel obstruction or active inflammation. The appendix is normal. Vascular/Lymphatic: The abdominal aorta and IVC are unremarkable. No portal venous gas. There is no adenopathy. Reproductive: The uterus is anteverted and grossly unremarkable. No adnexal masses. Other: Mild diffuse subcutaneous edema. Musculoskeletal: No acute or significant osseous findings. Review of the MIP images confirms the above findings. IMPRESSION: 1. No acute intrathoracic pathology. No CT evidence of pulmonary embolism. 2. Severe  fatty liver with findings of possible steatohepatitis. Clinical correlation is recommended. 3. Small ascites. 4. No bowel obstruction. Normal appendix. Electronically Signed   By: Anner Crete M.D.   On: 06/02/2020 21:26   CT ABDOMEN PELVIS W CONTRAST  Result Date: 06/02/2020 CLINICAL DATA:  21 year old female with abdominal pain and concern for pulmonary embolism. EXAM: CT ANGIOGRAPHY CHEST CT ABDOMEN AND PELVIS WITH CONTRAST TECHNIQUE: Multidetector CT imaging of the chest was performed using the standard protocol during bolus administration of intravenous contrast. Multiplanar CT image reconstructions and MIPs were obtained to evaluate the vascular anatomy. Multidetector CT imaging of the abdomen and pelvis was performed using the standard protocol during bolus administration of intravenous contrast. CONTRAST:  129mL OMNIPAQUE IOHEXOL 350 MG/ML SOLN COMPARISON:  Abdominal ultrasound dated 05/05/2020. FINDINGS: CTA CHEST FINDINGS Cardiovascular: There is no cardiomegaly or pericardial effusion. The thoracic aorta is unremarkable. The origins of the great vessels of the aortic arch appear patent. Faint linear  hyperdensity along the left lateral wall of the pulmonary trunk (56/6) likely artifactual. No pulmonary artery embolus identified. Mediastinum/Nodes: There is no hilar or mediastinal adenopathy. The esophagus is grossly unremarkable. No mediastinal fluid collection. Lungs/Pleura: Bibasilar linear atelectasis/scarring. No focal consolidation, pleural effusion, or pneumothorax. The central airways are patent. Musculoskeletal: No chest wall abnormality. No acute or significant osseous findings. Review of the MIP images confirms the above findings. CT ABDOMEN and PELVIS FINDINGS No intra-abdominal free air.  Small ascites. Hepatobiliary: Severe fatty liver. The liver is enlarged measuring 19 cm in midclavicular length. Correlation with clinical exam and LFTs recommended to evaluate for steatohepatitis. No intrahepatic biliary ductal dilatation. The gallbladder is predominantly contracted. No calcified gallstone. Pancreas: Unremarkable. No pancreatic ductal dilatation or surrounding inflammatory changes. Spleen: Normal in size without focal abnormality. Adrenals/Urinary Tract: The adrenal glands are unremarkable. There is no hydronephrosis on either side. The visualized ureters appear unremarkable. The urinary bladder is minimally distended and grossly unremarkable. Stomach/Bowel: There is no bowel obstruction or active inflammation. The appendix is normal. Vascular/Lymphatic: The abdominal aorta and IVC are unremarkable. No portal venous gas. There is no adenopathy. Reproductive: The uterus is anteverted and grossly unremarkable. No adnexal masses. Other: Mild diffuse subcutaneous edema. Musculoskeletal: No acute or significant osseous findings. Review of the MIP images confirms the above findings. IMPRESSION: 1. No acute intrathoracic pathology. No CT evidence of pulmonary embolism. 2. Severe fatty liver with findings of possible steatohepatitis. Clinical correlation is recommended. 3. Small ascites. 4. No bowel  obstruction. Normal appendix. Electronically Signed   By: Anner Crete M.D.   On: 06/02/2020 21:26   US Abdomen Limited RUQ (LIVER/GB)  Result Date: 05/05/2020 CLINICAL DATA:  Right upper quadrant pain and vomiting EXAM: ULTRASOUND ABDOMEN LIMITED RIGHT UPPER QUADRANT COMPARISON:  04/09/2020 FINDINGS: Gallbladder: No gallstones or wall thickening visualized. No sonographic Murphy sign noted by sonographer. Common bile duct: Diameter: 2 mm Liver: No focal lesion. Diffusely increased parenchymal echogenicity. Portal vein is patent on color Doppler imaging with normal direction of blood flow towards the liver. Other: None. IMPRESSION: Diffuse increased echogenicity of the hepatic parenchyma is a nonspecific indicator of hepatocellular dysfunction, most commonly steatosis. Electronically Signed   By: Miachel Roux M.D.   On: 05/05/2020 16:17    EKG Interpretation  Date/Time:  Tuesday June 03 2020 00:55:33 EDT Ventricular Rate:  130 PR Interval:    QRS Duration: 81 QT Interval:  308 QTC Calculation: 453 R Axis:   95 Text Interpretation: Sinus tachycardia Borderline right axis deviation Borderline T  abnormalities, diffuse leads When compared with ECG of 06/02/2020, HEART RATE has increased Confirmed by Delora Fuel ([id number]) on 06/03/2020 12:58:25 AM      .Critical Care Performed by: Antonietta Breach, PA-C Authorized by: Antonietta Breach, PA-C   Critical care provider statement:    Critical care time (minutes):  45   Critical care was necessary to treat or prevent imminent or life-threatening deterioration of the following conditions: ICH; SIRS.   Critical care was time spent personally by me on the following activities:  Discussions with consultants, evaluation of patient's response to treatment, examination of patient, ordering and performing treatments and interventions, ordering and review of laboratory studies, ordering and review of radiographic studies, pulse oximetry, re-evaluation of patient's  condition, obtaining history from patient or surrogate and review of old charts       Antonietta Breach, Hershal Coria 38/18/29 [id number]    Delora Fuel, MD 69/67/89 [phone]

## 2020-06-03 NOTE — Progress Notes (Signed)
Called by ED about new CT findings, CTH reviewed - most notably there is a new left hypodense subdural hematoma with some new midline shift. Per report, the patient is coagulopathic without known cause. My concern, which I discussed, in the setting of coagulopathy is that this may be either a hyperacute hemorrhage or an acute hemorrhage that is not clotting, which can cause a hypodense collection. The safest assumption is that it is an expanding hematoma. Given her unknown coagulopathy in the setting of thrombocytopenia and elevated INR, I recommend stat consultation with hematology to identify and/or correct the coagulopathy if possible. Even if she becomes symptomatic there is unfortunately nothing I can safely do to surgically remove the hematoma via craniotomy.   -Recommend stat heme consult, ICU admission and close neurologic monitoring, repeat CT with any change in exam or level of alertness.  -if she is neurologically stable, I'd hold off on hypertonics as this could theoretically encourage growth of the subdural -will update Dr. Arnoldo Morale, in the meantime, call me with any change of neurologic exam [phone]

## 2020-06-03 NOTE — H&P (Addendum)
NAME:  Jessica Frazier, MRN:  [id number], DOB:  11-25-2000, LOS: 0 ADMISSION DATE:  06/02/2020, CONSULTATION DATE:  3/22 REFERRING MD:  Charlyne Quale ED-PA, CHIEF COMPLAINT:  ICH  History of Present Illness:  20 year old female with PMH as below, which is significant for RA and hypothyroid. She was recently admitted in January of this year after having LFT elevation on routine labs. This was felt to be a result of Rinvoq, which she was taking for RA. The following is from the discharge summary.    Sent over from Rheumatology office given elevated Transaminases with AST over 1000.  On Admission AST was 1069 and ALT was 284.  Alk Phos also slightly elevated at 185.  Also had mild upper right quadrant pain with negative Murphy's sign.  Patient got significant lab work-up.  Hepatitis Panel , hCG, HIV, Monospot, Ethanol and Acetaminophen were all negative.  PT and aPTT were also normal.  RUQ was also obtained and showed liver inflammation, and some biliary sludge but no stone, blockage or cholecystitis.  Believe elevations were due to Rinvoq medication which patient had been taking for Rheumatoid Arthritis.  Stopped medication and did not restart at discharge.  LFT's slightly decreased at time of discharge and felt patient could be safely monitored outpatient.  She presented again to [facility] ED 3/21 with complaints of abdominal pain and dark stools for approximately 4 days. Pain is intermittent and localized to LUQ and RLQ. She did have one episode of emesis on 3/20, but is was not bloody in appearance. Stools were only dark, no bright red blood noted. As her symptoms were not improving, she presented to the ED 3/21. While using the restroom in the ED, she lost balance and fell, striking her head. CT showed multifocal acute SAH. Labs showed thrombocytopenia and platelets 43. Neurosurgery consulted. Patient was unable to complete FFP due to fever during transfusion. Repeat CT showed expanding SAH as well as new  SDH and ICH. PCCM asked to admit and hematology consulted.   Pertinent  Medical History   has a past medical history of Bell's palsy, Hypothyroidism, and Rheumatoid arthritis ([place]).  Significant Hospital Events: Including procedures, antibiotic start and stop dates in addition to other pertinent events   . 3/22 admitted to ICU with traumatic ICH, SDH, SAH. . 3/22 CT abdomen/pelvis > severe fatty liver with findings of possible steatohepatitis. Small ascites.  . CT head > Multifocal new ICH, Expanding L scalp hematoma, new L SDH. Thre cerebral hemorrhagic contusions. Mildly worse SAH. Trace rightward midline shift.    Interim History / Subjective:    Objective   Blood pressure (!) 104/57, pulse (!) 107, temperature (!) 100.9 F (38.3 C), temperature source Oral, resp. rate (!) 22, SpO2 100 %.       No intake or output data in the 24 hours ending 06/03/20 1043 There were no vitals filed for this visit.  Examination: General: [place] adult female, frail appearing.  HENT: [place]/AT, PERRL, no JVD Lungs: Clear, no distress Cardiovascular: RRR, no MRG Abdomen: Soft, non-distended, normoactive. Tender over the right and left lower quadrants to deep palpation.  Extremities: No acute deformity. Neuro: Alert, oriented, non-focal. Cranial nerves II - XII grossly intact. ROM 4/5 in all four extremities.  GU: not assessed.   Labs/imaging that I have personally reviewed   INR 2.6, Plt 43, Hgb 7, WBC 2, K 3.1 CT abdomen/pelvis > severe fatty liver with findings of possible steatohepatitis. Small ascites.  CTA chest unremarkable CT  head > Multifocal acute subarachnoid blood, greatest over the superior parietal lobes. Large left parietal scalp hematoma without skull fracture. Repeat CT head > Multifocal new ICH, Expanding L scalp hematoma, new L SDH. Thre cerebral hemorrhagic contusions. Mildly worse SAH. Trace rightward midline shift.   Resolved Hospital Problem list     Assessment & Plan:    Traumatic brain injury: multiple injuries following fall from standing. SDH, SAH, ICH, right frontal contusion. All in the setting of hypercoagulability.  - Neurosurgery following - FFP 2 units now - Platelets one unit now - Repeat INR pending - Vitamin K given in ED - Admit to neuro ICU - Frequent neuro checks - Repeat CT scan in the AM or sooner for any clinical decline.   Coagulopathy: not well understood. INR 2.6, fibrinogen < 60 - Heme consult pending - Trend INR - Blood products as above - Autoimmune workup pending ( CRP, sed, ANA, RF, extractable nuclear antigen ab, anti-jo) - TEG, DIC panel, LDH, D- dimer, pt and ptt factor inhibitor, Retic, immature platelet fraction - Transfusion reaction workup pending  Abdominal pain: urine preg negative, lipase 42,  Fatty liver on CT 3/21. Transaminitis - send fecal fat, amylase, alk phos  RA: - Supportive care - Attempting to obtain records from outpatient rheumatologist   GIB occult blood negative, but dark stools with coagulopathy - Protonix BID  Hypothyroid: TSH mildly elevated - check t4 - hold synthroid while NPO, if not taking orals by tomorrow will order IV  Protein calorie malnutrition - will need dietary evaluation at some point.   Best practice (evaluated daily)  Diet:  NPO Pain/Anxiety/Delirium protocol (if indicated): No VAP protocol (if indicated): Not indicated DVT prophylaxis: Contraindicated GI prophylaxis: PPI Glucose control:  SSI No Central venous access:  N/A Arterial line:  N/A Foley:  N/A Mobility:  bed rest  PT consulted: N/A Last date of multidisciplinary goals of care discussion [FULL CODE] Code Status:  full code Disposition: ICU  Labs   CBC: Recent Labs  Lab 06/02/20 1453 06/03/20 0803  WBC 5.1 2.6*  HGB 9.6* 7.0*  HCT 30.1* 22.2*  MCV 96.8 98.2  PLT 61* 43*    Basic Metabolic Panel: Recent Labs  Lab 06/02/20 1453 06/03/20 0803  NA 129* 134*  K 3.4* 3.1*  CL 98 108   CO2 23 20*  GLUCOSE 79 74  BUN <5* <5*  CREATININE 0.50 0.46  CALCIUM 7.2* 6.8*   GFR: CrCl cannot be calculated (Unknown ideal weight.). Recent Labs  Lab 06/02/20 1453 06/03/20 0803  WBC 5.1 2.6*    Liver Function Tests: Recent Labs  Lab 06/02/20 1453 06/03/20 0803  AST 118* 84*  ALT 40 27  ALKPHOS 169* 120  BILITOT 1.3* 1.0  PROT 6.6 4.9*  ALBUMIN 1.6* 1.2*   Recent Labs  Lab 06/02/20 1453  LIPASE 42   No results for input(s): AMMONIA in the last 168 hours.  ABG No results found for: PHART, PCO2ART, PO2ART, HCO3, TCO2, ACIDBASEDEF, O2SAT   Coagulation Profile: Recent Labs  Lab 06/03/20 0239  INR 2.6*    Cardiac Enzymes: No results for input(s): CKTOTAL, CKMB, CKMBINDEX, TROPONINI in the last 168 hours.  HbA1C: No results found for: HGBA1C  CBG: No results for input(s): GLUCAP in the last 168 hours.  Review of Systems:   Bolds are positive  Constitutional: weight loss, gain, night sweats, Fevers, chills, fatigue .  HEENT: headaches, Sore throat, sneezing, nasal congestion, post nasal drip, Difficulty swallowing, Tooth/dental problems, visual  complaints blurry vision, ear ache CV:  chest pain, radiates:,Orthopnea, PND, swelling in lower extremities, dizziness, palpitations, syncope.  GI  heartburn, indigestion, abdominal pain, nausea, vomiting, diarrhea, change in bowel habits, loss of appetite, bloody stools.  Resp: cough, productive: , hemoptysis, dyspnea, chest pain, pleuritic.  Skin: rash or itching or icterus GU: dysuria, change in color of urine, urgency or frequency. flank pain, hematuria  MS: joint pain or swelling. decreased range of motion  Psych: change in mood or affect. depression or anxiety.  Neuro: difficulty with speech, weakness, numbness, ataxia    Past Medical History:  She,  has a past medical history of Bell's palsy, Hypothyroidism, and Rheumatoid arthritis ([place]).   Surgical History:  History reviewed. No pertinent surgical  history.   Social History:   reports that she has never smoked. She has never used smokeless tobacco. She reports that she does not use drugs.   Family History:  Her family history includes Graves' disease in her mother; Healthy in her father.   Allergies Allergies  Allergen Reactions  . Other Diarrhea and Other (See Comments)    Omega XL  . Rinvoq [Upadacitinib] Other (See Comments)    Enlarged the patient's liver- had to come to the ED, 2022     Home Medications  Prior to Admission medications   Medication Sig Start Date End Date Taking? Authorizing Provider  levothyroxine (SYNTHROID) 75 MCG tablet Take 1 tablet (75 mcg total) by mouth daily. 08/08/19  Yes Shamleffer, Melanie Crazier, MD  dicyclomine (BENTYL) 20 MG tablet Take 1 tablet (20 mg total) by mouth 2 (two) times daily for 5 days. Patient not taking: Reported on 06/02/2020 05/05/20 05/10/20  Volanda Napoleon, PA-C     Critical care time:  50 minutes     Georgann Housekeeper, AGACNP-BC [place] for personal pager PCCM on call pager [phone] until 7pm. Please call Elink 7p-7a. [phone]  06/03/2020 11:50 AM

## 2020-06-03 NOTE — ED Notes (Signed)
Provider no longer at bedside ... will assess neuro status at this time.

## 2020-06-03 NOTE — Consult Note (Signed)
UNASSIGNED CONSULT  Reason for Consult: Abnormal liver enzymes and abdominal pain. Referring Physician: [facility]  Dewain Penning HPI: This is a 20 year old female with a PMH of RA and hypothyroidism admitted for complaints of abdominal pain and some diarrhea.  Obtaining an accurate history from the patient was challenging, even though the patient is alert and oriented.  She states that she was experiencing abdominal, but at the time of the evaluation her pain completely resolved.  A prior note reported that she complains of RUQ pain that worsened with movement, coughing, or sneezing.  There was also a report of diarrhea that she reports was ongoing for some time, but an ER note stated that the diarrhea was only for 1-2 days.  There was no mention of any hematochezia or melena.  She was briefly hospitalized for one day in January for markedly abnormal liver enzymes as a result of Renvoq, which was for her RA.  Over the interval time period her liver enzymes have declined.  Repeated imaging of her abdomen shows that she has significant steatosis and hepatomegaly.  In he ER she fell and she was identified to have a small subarachnoid hemorrhage.  Past Medical History:  Diagnosis Date  . Bell's palsy   . Hypothyroidism   . Rheumatoid arthritis ([place])     History reviewed. No pertinent surgical history.  Family History  Problem Relation Age of Onset  . Graves' disease Mother   . Healthy Father   . Diabetes Maternal Grandfather     Social History:  reports that she has never smoked. She has never used smokeless tobacco. She reports previous alcohol use. She reports that she does not use drugs.  Allergies:  Allergies  Allergen Reactions  . Other Diarrhea and Other (See Comments)    Omega XL  . Rinvoq [Upadacitinib] Other (See Comments)    Enlarged the patient's liver- had to come to the ED, 2022    Medications:  Scheduled: . sodium chloride   Intravenous Once  . sodium chloride    Intravenous Once  . pantoprazole (PROTONIX) IV  40 mg Intravenous Q12H   Continuous: . lactated ringers 75 mL/hr at 06/03/20 0636  . potassium chloride 10 mEq (06/03/20 1146)    Results for orders placed or performed during the hospital encounter of 06/02/20 (from the past 24 hour(s))  Lipase, blood     Status: None   Collection Time: 06/02/20  2:53 PM  Result Value Ref Range   Lipase 42 11 - 51 U/L  Comprehensive metabolic panel     Status: Abnormal   Collection Time: 06/02/20  2:53 PM  Result Value Ref Range   Sodium 129 (L) 135 - 145 mmol/L   Potassium 3.4 (L) 3.5 - 5.1 mmol/L   Chloride 98 98 - 111 mmol/L   CO2 23 22 - 32 mmol/L   Glucose, Bld 79 70 - 99 mg/dL   BUN <5 (L) 6 - 20 mg/dL   Creatinine, Ser 0.50 0.44 - 1.00 mg/dL   Calcium 7.2 (L) 8.9 - 10.3 mg/dL   Total Protein 6.6 6.5 - 8.1 g/dL   Albumin 1.6 (L) 3.5 - 5.0 g/dL   AST 118 (H) 15 - 41 U/L   ALT 40 0 - 44 U/L   Alkaline Phosphatase 169 (H) 38 - 126 U/L   Total Bilirubin 1.3 (H) 0.3 - 1.2 mg/dL   GFR, Estimated >60 >60 mL/min   Anion gap 8 5 - 15  CBC  Status: Abnormal   Collection Time: 06/02/20  2:53 PM  Result Value Ref Range   WBC 5.1 4.0 - 10.5 K/uL   RBC 3.11 (L) 3.87 - 5.11 MIL/uL   Hemoglobin 9.6 (L) 12.0 - 15.0 g/dL   HCT 30.1 (L) 36.0 - 46.0 %   MCV 96.8 80.0 - 100.0 fL   MCH 30.9 26.0 - 34.0 pg   MCHC 31.9 30.0 - 36.0 g/dL   RDW 16.9 (H) 11.5 - 15.5 %   Platelets 61 (L) 150 - 400 K/uL   nRBC 0.4 (H) 0.0 - 0.2 %  I-Stat beta hCG blood, ED     Status: None   Collection Time: 06/02/20  3:21 PM  Result Value Ref Range   I-stat hCG, quantitative <5.0 <5 mIU/mL   Comment 3          Urinalysis, Routine w reflex microscopic Urine, Clean Catch     Status: Abnormal   Collection Time: 06/02/20  4:29 PM  Result Value Ref Range   Color, Urine AMBER (A) YELLOW   APPearance HAZY (A) CLEAR   Specific Gravity, Urine 1.018 1.005 - 1.030   pH 6.0 5.0 - 8.0   Glucose, UA NEGATIVE NEGATIVE mg/dL    Hgb urine dipstick NEGATIVE NEGATIVE   Bilirubin Urine NEGATIVE NEGATIVE   Ketones, ur NEGATIVE NEGATIVE mg/dL   Protein, ur 30 (A) NEGATIVE mg/dL   Nitrite NEGATIVE NEGATIVE   Leukocytes,Ua NEGATIVE NEGATIVE   RBC / HPF 0-5 0 - 5 RBC/hpf   WBC, UA 0-5 0 - 5 WBC/hpf   Bacteria, UA RARE (A) NONE SEEN   Squamous Epithelial / LPF 0-5 0 - 5   Mucus PRESENT   Type and screen [place]     Status: None   Collection Time: 06/02/20  4:34 PM  Result Value Ref Range   ABO/RH(D) O POS    Antibody Screen NEG    Sample Expiration      06/05/2020,2359 Performed at [facility], [address], [place], [place] [address]   ABO/Rh     Status: None   Collection Time: 06/02/20  5:00 PM  Result Value Ref Range   ABO/RH(D)      O POS Performed at [facility] [address] [place], [place] [address]   POC occult blood, ED RN will collect     Status: None   Collection Time: 06/02/20  6:43 PM  Result Value Ref Range   Fecal Occult Bld NEGATIVE NEGATIVE  TSH     Status: Abnormal   Collection Time: 06/02/20 11:10 PM  Result Value Ref Range   TSH 4.769 (H) 0.350 - 4.500 uIU/mL  SARS CORONAVIRUS 2 (TAT 6-24 HRS) Nasopharyngeal Nasopharyngeal Swab     Status: None   Collection Time: 06/03/20 12:22 AM   Specimen: Nasopharyngeal Swab  Result Value Ref Range   SARS Coronavirus 2 NEGATIVE NEGATIVE  Protime-INR     Status: Abnormal   Collection Time: 06/03/20  2:39 AM  Result Value Ref Range   Prothrombin Time 27.2 (H) 11.4 - 15.2 seconds   INR 2.6 (H) 0.8 - 1.2  Prepare fresh frozen plasma     Status: None (Preliminary result)   Collection Time: 06/03/20  3:38 AM  Result Value Ref Range   Unit Number [id number]    Blood Component Type THW PLS APHR    Unit division B0    Status of Unit ISSUED    Transfusion Status OK  TO TRANSFUSE    Unit Number [id number]    Blood Component Type THAWED PLASMA    Unit division 00    Status of Unit ALLOCATED     Transfusion Status OK TO TRANSFUSE    Unit tag comment      VERBAL ORDERS PER DR Tamala Julian Performed at [facility], [place] [address] [place],  [place] [address]   CBC     Status: Abnormal   Collection Time: 06/03/20  8:03 AM  Result Value Ref Range   WBC 2.6 (L) 4.0 - 10.5 K/uL   RBC 2.26 (L) 3.87 - 5.11 MIL/uL   Hemoglobin 7.0 (L) 12.0 - 15.0 g/dL   HCT 22.2 (L) 36.0 - 46.0 %   MCV 98.2 80.0 - 100.0 fL   MCH 31.0 26.0 - 34.0 pg   MCHC 31.5 30.0 - 36.0 g/dL   RDW 17.2 (H) 11.5 - 15.5 %   Platelets 43 (L) 150 - 400 K/uL   nRBC 0.8 (H) 0.0 - 0.2 %  Comprehensive metabolic panel     Status: Abnormal   Collection Time: 06/03/20  8:03 AM  Result Value Ref Range   Sodium 134 (L) 135 - 145 mmol/L   Potassium 3.1 (L) 3.5 - 5.1 mmol/L   Chloride 108 98 - 111 mmol/L   CO2 20 (L) 22 - 32 mmol/L   Glucose, Bld 74 70 - 99 mg/dL   BUN <5 (L) 6 - 20 mg/dL   Creatinine, Ser 0.46 0.44 - 1.00 mg/dL   Calcium 6.8 (L) 8.9 - 10.3 mg/dL   Total Protein 4.9 (L) 6.5 - 8.1 g/dL   Albumin 1.2 (L) 3.5 - 5.0 g/dL   AST 84 (H) 15 - 41 U/L   ALT 27 0 - 44 U/L   Alkaline Phosphatase 120 38 - 126 U/L   Total Bilirubin 1.0 0.3 - 1.2 mg/dL   GFR, Estimated >60 >60 mL/min   Anion gap 6 5 - 15  Protime-INR     Status: Abnormal   Collection Time: 06/03/20  9:13 AM  Result Value Ref Range   Prothrombin Time 26.8 (H) 11.4 - 15.2 seconds   INR 2.6 (H) 0.8 - 1.2  Fibrinogen     Status: Abnormal   Collection Time: 06/03/20  9:13 AM  Result Value Ref Range   Fibrinogen <60 (LL) 210 - 475 mg/dL  APTT     Status: Abnormal   Collection Time: 06/03/20  9:13 AM  Result Value Ref Range   aPTT 78 (H) 24 - 36 seconds  Prepare Pheresed Platelets     Status: None (Preliminary result)   Collection Time: 06/03/20 10:37 AM  Result Value Ref Range   Unit Number [id number]    Blood Component Type PLTP1 PSORALEN TREATED    Unit division 00    Status of Unit ISSUED    Transfusion Status OK TO TRANSFUSE    Unit  tag comment      VERBAL ORDERS PER DR Tamala Julian Performed at [facility], [address], [place], [place] [address]   Save Smear     Status: None   Collection Time: 06/03/20 10:55 AM  Result Value Ref Range   Smear Review SMEAR STAINED AND AVAILABLE FOR REVIEW      CT HEAD WO CONTRAST  Addendum Date: 06/03/2020   ADDENDUM REPORT: 06/03/2020 09:16 ADDENDUM: Critical Value/emergent results were called by telephone at the time of interpretation on 06/03/2020 at 0904 hours to ED provider Sam  Petrucelli PA who verbally acknowledged these results. And she advises the patient is coagulopathic with an INR of 2.6. Electronically Signed   By: Genevie Ann M.D.   On: 06/03/2020 09:16   Result Date: 06/03/2020 CLINICAL DATA:  20 year old female status post head trauma from fall with superior convexity subarachnoid hemorrhage. EXAM: CT HEAD WITHOUT CONTRAST TECHNIQUE: Contiguous axial images were obtained from the base of the skull through the vertex without intravenous contrast. COMPARISON:  Head CT 0115 hours today. FINDINGS: Brain: New small low-density left side subdural hematoma measures 3-4 mm in thickness (series 3, image 16). There is trace rightward midline shift now. Small volume subarachnoid hemorrhage mostly over the left superior convexity and along the interhemispheric fissure has mildly increased. Superimposed focal hemorrhagic contusions of the right cingulate gyrus (coronal image 21) and left superior frontal gyrus up to 3 cm (coronal image 36) are now apparent with mild regional edema. There is also a more subtle hemorrhagic contusion of the right anterior temporal tip suspected (series 3, image 7 and coronal image 24) with mild edema. Basilar cisterns remain normal. No intraventricular blood or ventriculomegaly. No superimposed acute cortically based infarct. Vascular: No suspicious intracranial vascular hyperdensity. Skull: No skull fracture identified. Sinuses/Orbits: Visualized paranasal sinuses  and mastoids are stable and well pneumatized. Other: Expanded and now very large scalp hematoma tracking over most of the superior and left lateral convexity. Areas of hypodense likely hyperacute scalp hemorrhage on series 3, image 18 are new from earlier today. Hematoma thickness up to 16 mm. Visualized orbit soft tissues are within normal limits. IMPRESSION: 1. Multifocal new intracranial hemorrhage, in conjunction with large and expanding left scalp hematoma, raises the possibility of underlying Coagulopathy. 2. New left side subdural hematoma since 0115 hours, 3-4 mm in thickness. 3. Three cerebral hemorrhagic contusions now identified, the largest nearly 3 cm in the left superior frontal gyrus (right cingulate and right anterior temporal tip also). 4. Mildly increased small volume of posttraumatic appearing subarachnoid hemorrhage. 5. Trace rightward midline shift now. Basilar cisterns remain normal. No IVH or ventriculomegaly. 6. No skull fracture identified. Electronically Signed: By: Genevie Ann M.D. On: 06/03/2020 08:47   CT Head Wo Contrast  Result Date: 06/03/2020 CLINICAL DATA:  Head trauma EXAM: CT HEAD WITHOUT CONTRAST TECHNIQUE: Contiguous axial images were obtained from the base of the skull through the vertex without intravenous contrast. COMPARISON:  None. FINDINGS: Brain: Multifocal acute subarachnoid blood, greatest over the superior parietal lobes. No midline shift or other mass effect. Vascular: No abnormal hyperdensity of the major intracranial arteries or dural venous sinuses. No intracranial atherosclerosis. Skull: Large left parietal scalp hematoma without skull fracture. Sinuses/Orbits: No fluid levels or advanced mucosal thickening of the visualized paranasal sinuses. No mastoid or middle ear effusion. The orbits are normal. IMPRESSION: 1. Multifocal acute subarachnoid blood, greatest over the superior parietal lobes. 2. Large left parietal scalp hematoma without skull fracture. Critical  Value/emergent results were called by telephone at the time of interpretation on 06/03/2020 at 1:41 am to provider [facility] , who verbally acknowledged these results. Electronically Signed   By: Ulyses Jarred M.D.   On: 06/03/2020 01:45   CT Angio Chest PE W/Cm &/Or Wo Cm  Result Date: 06/02/2020 CLINICAL DATA:  20 year old female with abdominal pain and concern for pulmonary embolism. EXAM: CT ANGIOGRAPHY CHEST CT ABDOMEN AND PELVIS WITH CONTRAST TECHNIQUE: Multidetector CT imaging of the chest was performed using the standard protocol during bolus administration of intravenous contrast. Multiplanar CT image  reconstructions and MIPs were obtained to evaluate the vascular anatomy. Multidetector CT imaging of the abdomen and pelvis was performed using the standard protocol during bolus administration of intravenous contrast. CONTRAST:  165mL OMNIPAQUE IOHEXOL 350 MG/ML SOLN COMPARISON:  Abdominal ultrasound dated 05/05/2020. FINDINGS: CTA CHEST FINDINGS Cardiovascular: There is no cardiomegaly or pericardial effusion. The thoracic aorta is unremarkable. The origins of the great vessels of the aortic arch appear patent. Faint linear hyperdensity along the left lateral wall of the pulmonary trunk (56/6) likely artifactual. No pulmonary artery embolus identified. Mediastinum/Nodes: There is no hilar or mediastinal adenopathy. The esophagus is grossly unremarkable. No mediastinal fluid collection. Lungs/Pleura: Bibasilar linear atelectasis/scarring. No focal consolidation, pleural effusion, or pneumothorax. The central airways are patent. Musculoskeletal: No chest wall abnormality. No acute or significant osseous findings. Review of the MIP images confirms the above findings. CT ABDOMEN and PELVIS FINDINGS No intra-abdominal free air.  Small ascites. Hepatobiliary: Severe fatty liver. The liver is enlarged measuring 19 cm in midclavicular length. Correlation with clinical exam and LFTs recommended to evaluate for  steatohepatitis. No intrahepatic biliary ductal dilatation. The gallbladder is predominantly contracted. No calcified gallstone. Pancreas: Unremarkable. No pancreatic ductal dilatation or surrounding inflammatory changes. Spleen: Normal in size without focal abnormality. Adrenals/Urinary Tract: The adrenal glands are unremarkable. There is no hydronephrosis on either side. The visualized ureters appear unremarkable. The urinary bladder is minimally distended and grossly unremarkable. Stomach/Bowel: There is no bowel obstruction or active inflammation. The appendix is normal. Vascular/Lymphatic: The abdominal aorta and IVC are unremarkable. No portal venous gas. There is no adenopathy. Reproductive: The uterus is anteverted and grossly unremarkable. No adnexal masses. Other: Mild diffuse subcutaneous edema. Musculoskeletal: No acute or significant osseous findings. Review of the MIP images confirms the above findings. IMPRESSION: 1. No acute intrathoracic pathology. No CT evidence of pulmonary embolism. 2. Severe fatty liver with findings of possible steatohepatitis. Clinical correlation is recommended. 3. Small ascites. 4. No bowel obstruction. Normal appendix. Electronically Signed   By: Anner Crete M.D.   On: 06/02/2020 21:26   CT ABDOMEN PELVIS W CONTRAST  Result Date: 06/02/2020 CLINICAL DATA:  20 year old female with abdominal pain and concern for pulmonary embolism. EXAM: CT ANGIOGRAPHY CHEST CT ABDOMEN AND PELVIS WITH CONTRAST TECHNIQUE: Multidetector CT imaging of the chest was performed using the standard protocol during bolus administration of intravenous contrast. Multiplanar CT image reconstructions and MIPs were obtained to evaluate the vascular anatomy. Multidetector CT imaging of the abdomen and pelvis was performed using the standard protocol during bolus administration of intravenous contrast. CONTRAST:  13mL OMNIPAQUE IOHEXOL 350 MG/ML SOLN COMPARISON:  Abdominal ultrasound dated  05/05/2020. FINDINGS: CTA CHEST FINDINGS Cardiovascular: There is no cardiomegaly or pericardial effusion. The thoracic aorta is unremarkable. The origins of the great vessels of the aortic arch appear patent. Faint linear hyperdensity along the left lateral wall of the pulmonary trunk (56/6) likely artifactual. No pulmonary artery embolus identified. Mediastinum/Nodes: There is no hilar or mediastinal adenopathy. The esophagus is grossly unremarkable. No mediastinal fluid collection. Lungs/Pleura: Bibasilar linear atelectasis/scarring. No focal consolidation, pleural effusion, or pneumothorax. The central airways are patent. Musculoskeletal: No chest wall abnormality. No acute or significant osseous findings. Review of the MIP images confirms the above findings. CT ABDOMEN and PELVIS FINDINGS No intra-abdominal free air.  Small ascites. Hepatobiliary: Severe fatty liver. The liver is enlarged measuring 19 cm in midclavicular length. Correlation with clinical exam and LFTs recommended to evaluate for steatohepatitis. No intrahepatic biliary ductal dilatation. The gallbladder is predominantly  contracted. No calcified gallstone. Pancreas: Unremarkable. No pancreatic ductal dilatation or surrounding inflammatory changes. Spleen: Normal in size without focal abnormality. Adrenals/Urinary Tract: The adrenal glands are unremarkable. There is no hydronephrosis on either side. The visualized ureters appear unremarkable. The urinary bladder is minimally distended and grossly unremarkable. Stomach/Bowel: There is no bowel obstruction or active inflammation. The appendix is normal. Vascular/Lymphatic: The abdominal aorta and IVC are unremarkable. No portal venous gas. There is no adenopathy. Reproductive: The uterus is anteverted and grossly unremarkable. No adnexal masses. Other: Mild diffuse subcutaneous edema. Musculoskeletal: No acute or significant osseous findings. Review of the MIP images confirms the above findings.  IMPRESSION: 1. No acute intrathoracic pathology. No CT evidence of pulmonary embolism. 2. Severe fatty liver with findings of possible steatohepatitis. Clinical correlation is recommended. 3. Small ascites. 4. No bowel obstruction. Normal appendix. Electronically Signed   By: Anner Crete M.D.   On: 06/02/2020 21:26    ROS:  As stated above in the HPI otherwise negative.  Blood pressure 110/62, pulse (!) 107, temperature 97.9 F (36.6 C), temperature source Oral, resp. rate (!) 28, SpO2 100 %.    PE: Gen: NAD, Alert and Oriented HEENT:  [place]/AT, EOMI Neck: Supple, no LAD Lungs: CTA Bilaterally CV: RRR without M/G/R ABD: Soft, NTND, +BS, thin, +hepatomegaly Ext: No C/C/E  Assessment/Plan: 1) Abnormal liver enzymes. 2) Hepatic steatosis. 3) ? RUQ pain. 4) Dairrhea.     The patient's diarrhea may be an acute issue.  Again, obtaining a history for this complaint and he abdominal pain was difficult.  The pain sounds to be musculoskeletal as it is brought about with movement, but she does have hepatomegaly.  Stretching of the liver capsule can cause some pain.  It is not clear why she has such significant steatosis as she is very thin and she does not exhibit any other metabolic disorders such as diabetes.  The liver enzymes have not normalized and the marked elevation was felt to be from Renvoq.  There are no other laboratory values to be used for comparison.  She tested negative for any viral hepatidities.  If she has a baseline elevation in her liver enzymes she can have an autoimmune hepatitis manifesting with severe steatosis.  Plan: 1) Follow liver enzymes. 2) If the liver enzymes do not normalize, an AIH work up will need to be pursued.  Stefano Trulson D 06/03/2020, 11:57 AM

## 2020-06-03 NOTE — ED Notes (Signed)
Pt was assisted to bathroom by this RN. Pt asked mother to stand out side while she used the bathroom. Pt soon after started screaming. Pts mother, [place], [place], [place], RN, and this RN all went to assist pt. Pt was on the floor beside toilet stating that she fell. Pt states "I stood up to grab tissue when I tripped and hit the back of my head, possibly on the toilet or ground."  PA [employer] notified for assessment. GCS 15. Vitals signs WDL.

## 2020-06-03 NOTE — Consult Note (Signed)
Reason for Consult: Subarachnoid hemorrhage Referring Physician: ER Dr.  Dewain Penning is an 20 y.o. female.  HPI: The patient is a 20 year old female who is in the ER because of medical issues.  She fell striking her head.  She was worked up with a head CT which demonstrated a small amount of subarachnoid hemorrhage along the convexities bilaterally.  She was also found to be thrombocytopenic and coagulopathic.  She has been observed in the ER.  A neurosurgical consultation was requested.  Presently the patient is alert and pleasant.  She complains of soreness on her scalp on the left where she hit her head.  She denies neck pain, numbness, tingling, weakness, etc.  Past Medical History:  Diagnosis Date  . Bell's palsy   . Hypothyroidism   . Rheumatoid arthritis ([place])     History reviewed. No pertinent surgical history.  Family History  Problem Relation Age of Onset  . Graves' disease Mother   . Healthy Father     Social History:  reports that she has never smoked. She has never used smokeless tobacco. She reports that she does not use drugs. No history on file for alcohol use.  Allergies:  Allergies  Allergen Reactions  . Other Diarrhea and Other (See Comments)    Omega XL  . Rinvoq [Upadacitinib] Other (See Comments)    Enlarged the patient's liver- had to come to the ED, 2022    Medications:  I have reviewed the patient's current medications. Prior to Admission: (Not in a hospital admission)  Scheduled:  Continuous: . lactated ringers 75 mL/hr at 06/03/20 0636   PRN: Anti-infectives (From admission, onward)   None       Results for orders placed or performed during the hospital encounter of 06/02/20 (from the past 48 hour(s))  Lipase, blood     Status: None   Collection Time: 06/02/20  2:53 PM  Result Value Ref Range   Lipase 42 11 - 51 U/L    Comment: Performed at [facility], [address] [place], [place] [address]  Comprehensive metabolic  panel     Status: Abnormal   Collection Time: 06/02/20  2:53 PM  Result Value Ref Range   Sodium 129 (L) 135 - 145 mmol/L   Potassium 3.4 (L) 3.5 - 5.1 mmol/L   Chloride 98 98 - 111 mmol/L   CO2 23 22 - 32 mmol/L   Glucose, Bld 79 70 - 99 mg/dL    Comment: Glucose reference range applies only to samples taken after fasting for at least 8 hours.   BUN <5 (L) 6 - 20 mg/dL   Creatinine, Ser 0.50 0.44 - 1.00 mg/dL   Calcium 7.2 (L) 8.9 - 10.3 mg/dL   Total Protein 6.6 6.5 - 8.1 g/dL   Albumin 1.6 (L) 3.5 - 5.0 g/dL   AST 118 (H) 15 - 41 U/L   ALT 40 0 - 44 U/L   Alkaline Phosphatase 169 (H) 38 - 126 U/L   Total Bilirubin 1.3 (H) 0.3 - 1.2 mg/dL   GFR, Estimated >60 >60 mL/min    Comment: (NOTE) Calculated using the CKD-EPI Creatinine Equation (2021)    Anion gap 8 5 - 15    Comment: Performed at [facility], [place] [address], [place], [place] [address]  CBC     Status: Abnormal   Collection Time: 06/02/20  2:53 PM  Result Value Ref Range   WBC 5.1 4.0 - 10.5 K/uL   RBC  3.11 (L) 3.87 - 5.11 MIL/uL   Hemoglobin 9.6 (L) 12.0 - 15.0 g/dL   HCT 30.1 (L) 36.0 - 46.0 %   MCV 96.8 80.0 - 100.0 fL   MCH 30.9 26.0 - 34.0 pg   MCHC 31.9 30.0 - 36.0 g/dL   RDW 16.9 (H) 11.5 - 15.5 %   Platelets 61 (L) 150 - 400 K/uL    Comment: Immature Platelet Fraction may be clinically indicated, consider ordering this additional test [id number] REPEATED TO VERIFY PLATELET COUNT CONFIRMED BY SMEAR    nRBC 0.4 (H) 0.0 - 0.2 %    Comment: Performed at [facility], [place] [address], [place], [place] [address]  I-Stat beta hCG blood, ED     Status: None   Collection Time: 06/02/20  3:21 PM  Result Value Ref Range   I-stat hCG, quantitative <5.0 <5 mIU/mL   Comment 3            Comment:   GEST. AGE      CONC.  (mIU/mL)   <=1 WEEK        5 - 50     2 WEEKS       50 - 500     3 WEEKS       100 - 10,000     4 WEEKS     1,000 - 30,000        FEMALE AND NON-PREGNANT FEMALE:     LESS THAN 5  mIU/mL   Urinalysis, Routine w reflex microscopic Urine, Clean Catch     Status: Abnormal   Collection Time: 06/02/20  4:29 PM  Result Value Ref Range   Color, Urine AMBER (A) YELLOW    Comment: BIOCHEMICALS MAY BE AFFECTED BY COLOR   APPearance HAZY (A) CLEAR   Specific Gravity, Urine 1.018 1.005 - 1.030   pH 6.0 5.0 - 8.0   Glucose, UA NEGATIVE NEGATIVE mg/dL   Hgb urine dipstick NEGATIVE NEGATIVE   Bilirubin Urine NEGATIVE NEGATIVE   Ketones, ur NEGATIVE NEGATIVE mg/dL   Protein, ur 30 (A) NEGATIVE mg/dL   Nitrite NEGATIVE NEGATIVE   Leukocytes,Ua NEGATIVE NEGATIVE   RBC / HPF 0-5 0 - 5 RBC/hpf   WBC, UA 0-5 0 - 5 WBC/hpf   Bacteria, UA RARE (A) NONE SEEN   Squamous Epithelial / LPF 0-5 0 - 5   Mucus PRESENT     Comment: Performed at [facility], [address] [place], [place] [address]  Type and screen [place]     Status: None   Collection Time: 06/02/20  4:34 PM  Result Value Ref Range   ABO/RH(D) O POS    Antibody Screen NEG    Sample Expiration      06/05/2020,2359 Performed at [facility], [place] [address] [place], [place] [address]   ABO/Rh     Status: None   Collection Time: 06/02/20  5:00 PM  Result Value Ref Range   ABO/RH(D)      O POS Performed at [place] [address], [place], [place] [address]   POC occult blood, ED RN will collect     Status: None   Collection Time: 06/02/20  6:43 PM  Result Value Ref Range   Fecal Occult Bld NEGATIVE NEGATIVE  TSH     Status: Abnormal   Collection Time: 06/02/20 11:10 PM  Result Value Ref Range   TSH 4.769 (H) 0.350 - 4.500 uIU/mL  Comment: Performed by a 3rd Generation assay with a functional sensitivity of <=0.01 uIU/mL. Performed at [facility], [place] [address] [place], [place] [address]   Protime-INR     Status: Abnormal   Collection Time: 06/03/20  2:39 AM  Result Value Ref Range   Prothrombin Time 27.2 (H) 11.4 - 15.2 seconds   INR 2.6 (H) 0.8 -  1.2    Comment: (NOTE) INR goal varies based on device and disease states. Performed at [facility], [place] [address] [place], [place] [address]   Prepare fresh frozen plasma     Status: None (Preliminary result)   Collection Time: 06/03/20  3:38 AM  Result Value Ref Range   Unit Number [id number]    Blood Component Type THW PLS APHR    Unit division B0    Status of Unit ISSUED    Transfusion Status      OK TO TRANSFUSE Performed at [facility], [place] [address], [place], [place] [address]    Unit Number [id number]    Blood Component Type THAWED PLASMA    Unit division 00    Status of Unit ALLOCATED    Transfusion Status OK TO TRANSFUSE     CT Head Wo Contrast  Result Date: 06/03/2020 CLINICAL DATA:  Head trauma EXAM: CT HEAD WITHOUT CONTRAST TECHNIQUE: Contiguous axial images were obtained from the base of the skull through the vertex without intravenous contrast. COMPARISON:  None. FINDINGS: Brain: Multifocal acute subarachnoid blood, greatest over the superior parietal lobes. No midline shift or other mass effect. Vascular: No abnormal hyperdensity of the major intracranial arteries or dural venous sinuses. No intracranial atherosclerosis. Skull: Large left parietal scalp hematoma without skull fracture. Sinuses/Orbits: No fluid levels or advanced mucosal thickening of the visualized paranasal sinuses. No mastoid or middle ear effusion. The orbits are normal. IMPRESSION: 1. Multifocal acute subarachnoid blood, greatest over the superior parietal lobes. 2. Large left parietal scalp hematoma without skull fracture. Critical Value/emergent results were called by telephone at the time of interpretation on 06/03/2020 at 1:41 am to provider [facility] , who verbally acknowledged these results. Electronically Signed   By: Ulyses Jarred M.D.   On: 06/03/2020 01:45   CT Angio Chest PE W/Cm &/Or Wo Cm  Result Date: 06/02/2020 CLINICAL DATA:  20 year old female with abdominal  pain and concern for pulmonary embolism. EXAM: CT ANGIOGRAPHY CHEST CT ABDOMEN AND PELVIS WITH CONTRAST TECHNIQUE: Multidetector CT imaging of the chest was performed using the standard protocol during bolus administration of intravenous contrast. Multiplanar CT image reconstructions and MIPs were obtained to evaluate the vascular anatomy. Multidetector CT imaging of the abdomen and pelvis was performed using the standard protocol during bolus administration of intravenous contrast. CONTRAST:  143mL OMNIPAQUE IOHEXOL 350 MG/ML SOLN COMPARISON:  Abdominal ultrasound dated 05/05/2020. FINDINGS: CTA CHEST FINDINGS Cardiovascular: There is no cardiomegaly or pericardial effusion. The thoracic aorta is unremarkable. The origins of the great vessels of the aortic arch appear patent. Faint linear hyperdensity along the left lateral wall of the pulmonary trunk (56/6) likely artifactual. No pulmonary artery embolus identified. Mediastinum/Nodes: There is no hilar or mediastinal adenopathy. The esophagus is grossly unremarkable. No mediastinal fluid collection. Lungs/Pleura: Bibasilar linear atelectasis/scarring. No focal consolidation, pleural effusion, or pneumothorax. The central airways are patent. Musculoskeletal: No chest wall abnormality. No acute or significant osseous findings. Review of the MIP images confirms the above findings. CT ABDOMEN and PELVIS FINDINGS No intra-abdominal free air.  Small ascites.  Hepatobiliary: Severe fatty liver. The liver is enlarged measuring 19 cm in midclavicular length. Correlation with clinical exam and LFTs recommended to evaluate for steatohepatitis. No intrahepatic biliary ductal dilatation. The gallbladder is predominantly contracted. No calcified gallstone. Pancreas: Unremarkable. No pancreatic ductal dilatation or surrounding inflammatory changes. Spleen: Normal in size without focal abnormality. Adrenals/Urinary Tract: The adrenal glands are unremarkable. There is no  hydronephrosis on either side. The visualized ureters appear unremarkable. The urinary bladder is minimally distended and grossly unremarkable. Stomach/Bowel: There is no bowel obstruction or active inflammation. The appendix is normal. Vascular/Lymphatic: The abdominal aorta and IVC are unremarkable. No portal venous gas. There is no adenopathy. Reproductive: The uterus is anteverted and grossly unremarkable. No adnexal masses. Other: Mild diffuse subcutaneous edema. Musculoskeletal: No acute or significant osseous findings. Review of the MIP images confirms the above findings. IMPRESSION: 1. No acute intrathoracic pathology. No CT evidence of pulmonary embolism. 2. Severe fatty liver with findings of possible steatohepatitis. Clinical correlation is recommended. 3. Small ascites. 4. No bowel obstruction. Normal appendix. Electronically Signed   By: Anner Crete M.D.   On: 06/02/2020 21:26   CT ABDOMEN PELVIS W CONTRAST  Result Date: 06/02/2020 CLINICAL DATA:  20 year old female with abdominal pain and concern for pulmonary embolism. EXAM: CT ANGIOGRAPHY CHEST CT ABDOMEN AND PELVIS WITH CONTRAST TECHNIQUE: Multidetector CT imaging of the chest was performed using the standard protocol during bolus administration of intravenous contrast. Multiplanar CT image reconstructions and MIPs were obtained to evaluate the vascular anatomy. Multidetector CT imaging of the abdomen and pelvis was performed using the standard protocol during bolus administration of intravenous contrast. CONTRAST:  117mL OMNIPAQUE IOHEXOL 350 MG/ML SOLN COMPARISON:  Abdominal ultrasound dated 05/05/2020. FINDINGS: CTA CHEST FINDINGS Cardiovascular: There is no cardiomegaly or pericardial effusion. The thoracic aorta is unremarkable. The origins of the great vessels of the aortic arch appear patent. Faint linear hyperdensity along the left lateral wall of the pulmonary trunk (56/6) likely artifactual. No pulmonary artery embolus identified.  Mediastinum/Nodes: There is no hilar or mediastinal adenopathy. The esophagus is grossly unremarkable. No mediastinal fluid collection. Lungs/Pleura: Bibasilar linear atelectasis/scarring. No focal consolidation, pleural effusion, or pneumothorax. The central airways are patent. Musculoskeletal: No chest wall abnormality. No acute or significant osseous findings. Review of the MIP images confirms the above findings. CT ABDOMEN and PELVIS FINDINGS No intra-abdominal free air.  Small ascites. Hepatobiliary: Severe fatty liver. The liver is enlarged measuring 19 cm in midclavicular length. Correlation with clinical exam and LFTs recommended to evaluate for steatohepatitis. No intrahepatic biliary ductal dilatation. The gallbladder is predominantly contracted. No calcified gallstone. Pancreas: Unremarkable. No pancreatic ductal dilatation or surrounding inflammatory changes. Spleen: Normal in size without focal abnormality. Adrenals/Urinary Tract: The adrenal glands are unremarkable. There is no hydronephrosis on either side. The visualized ureters appear unremarkable. The urinary bladder is minimally distended and grossly unremarkable. Stomach/Bowel: There is no bowel obstruction or active inflammation. The appendix is normal. Vascular/Lymphatic: The abdominal aorta and IVC are unremarkable. No portal venous gas. There is no adenopathy. Reproductive: The uterus is anteverted and grossly unremarkable. No adnexal masses. Other: Mild diffuse subcutaneous edema. Musculoskeletal: No acute or significant osseous findings. Review of the MIP images confirms the above findings. IMPRESSION: 1. No acute intrathoracic pathology. No CT evidence of pulmonary embolism. 2. Severe fatty liver with findings of possible steatohepatitis. Clinical correlation is recommended. 3. Small ascites. 4. No bowel obstruction. Normal appendix. Electronically Signed   By: Anner Crete M.D.   On: 06/02/2020 21:26  ROS Blood pressure 102/63,  pulse (!) 121, temperature (!) 100.9 F (38.3 C), temperature source Oral, resp. rate (!) 31, SpO2 98 %. Estimated body mass index is 19.2 kg/m as calculated from the following:   Height as of 05/05/20: 5\' 2"  (1.575 m).   Weight as of 05/05/20: 47.6 kg.  Physical Exam  General: An alert, pleasant and thin 20 year old female in no apparent distress  HEENT: The patient has left parietal scalp swelling which is tender.  Her pupils are equal round react to light, extraocular muscles are intact.  There is no CSF otorrhea or rhinorrhea.  Neck: Unremarkable.  Mildly limited cervical range of motion.  Thorax: Symmetric  Abdomen: Soft  Extremities: Unremarkable  Neurologic exam: The patient is alert and oriented x3.  Glasgow Coma Scale 15.  Cranial nerves II through XII were examined bilaterally and grossly normal.  Vision and hearing were grossly normal.  The patient's motor strength is 5/5 in her bilateral bicep, tricep, handgrip, gastrocnemius and dorsiflexors.  Cerebellar function was intact to rapid altering movements of the upper extremities bilaterally.  Sensory function was intact to light touch in all tested dermatomes bilaterally.  I have reviewed the patient's head CT performed this [facility] at [facility].  She has a small amount of bilateral superior convexity subarachnoid hemorrhage.  There is no blood in the basal cisterns.  Assessment/Plan: Traumatic subarachnoid hemorrhage, coagulopathy: The patient is doing well clinically.  With her coagulopathy I would recommend repeating her CAT scan.  Medical issues: Per the ER and medical doctors.  Ophelia Charter 06/03/2020, 7:49 AM

## 2020-06-03 NOTE — Consult Note (Addendum)
Jessica Frazier  [phone] [phone]    Jessica Frazier  Referring MD:  Kennith Maes, PA-C  Reason for Referral: Coagulopathy  HPI: Jessica Frazier is a 20 year old female with a past medical history significant for rheumatoid arthritis and hypothyroidism.  She had a recent hospitalization in January 2022 due to significantly elevated LFTs.  At that time, she was on Rinvoq for her rheumatoid arthritis and transaminitis was thought to be related to this medication.  She has been off medication for RA since her hospitalization in January.  She was due to start treatment with Enbrel tomorrow.  On the day of discharge (04/09/2020) her INR was normal at 1.1 and PTT was only mildly elevated at 38.  The patient presented to the emergency room on 3/21 due to nausea, vomiting, and black-colored stools.  She reported that she also has lower extremity edema which is typical for her when her RA flares.  Initial work-up showed a WBC of 5.1, hemoglobin 9.6, platelet count 61,000.  Her baseline hemoglobin appears to be in the 9-10 range.  She was noted to have thrombocytopenia during her last hospital admission and on admission her platelet count was 94,000 and it slowly drifted down to 77,000 on the day of discharge.  Her CMET showed a sodium of 129, potassium 3.4, calcium 7.2, albumin 1.6, AST 118, ALT 40, alk phos 169, and T bili 1.3.  Stool for occult blood was negative.  She had a CTA chest as well as a CT abdomen pelvis which showed no acute intrathoracic pathology, negative PE, severe fatty liver with findings of possible steatohepatitis, small ascites.  While in the emergency room, she suffered a mechanical fall and hit her head.  Following the fall, she had a CT head which showed multifocal acute subarachnoid blood, greatest over the superior parietal lobes, large left parietal scalp hematoma without skull fracture.  She was found to have a PT that was  elevated at 27.2 and INR was elevated at 2.6.  1 unit FFP was ordered.  The patient developed a fever during the infusion and it was stopped.  She did not develop any rigors, shortness of breath, or signs of anaphylaxis.  A repeat CT of the head showed multifocal new intracranial hemorrhage, in conjunction with large and expanding left scalp hematoma, raises possibility of underlying coagulopathy, new left sided subdural hematoma, 3 to 4 mm in thickness, 3 cerebral hemorrhagic contusions now identified the large nearly 3 cm in the left superior frontal gyrus, mildly increased small volume of posttraumatic appearing subarachnoid hemorrhage, trace rightward midline shift.  Neurosurgery has consulted and they have recommended hematology consultation for work-up of coagulopathy.  They plan to repeat a CT scan tomorrow.  The patient was seen in the emergency room today.  Critical care team is also seeing the patient.  She reports pain to the back of her head.  She currently is not having any headaches other than the pain to where she hit her head.  She is not having any blurred vision.  She reports abdominal distention, nausea, vomiting, black stools.  She is not having any other bleeding such as epistaxis, hemoptysis, hematemesis, hematochezia, or hematuria.  Reports lower extremity edema which is typical for her when she has an RA flare. She denies chest pain, shortness of breath.  She denies taking any antiplatelet therapies or anticoagulants at home.  Denies family history of bleeding or clotting disorders.  Hematology was asked see the patient  to make recommendations regarding her coagulopathy.  Past Medical History:  Diagnosis Date  . Bell's palsy   . Hypothyroidism   . Rheumatoid arthritis ([place])   :  History reviewed. No pertinent surgical history.:   CURRENT MEDS: Current Facility-Administered Medications  Medication Dose Route Frequency Provider Last Rate Last Admin  . lactated ringers infusion    Intravenous Continuous Antonietta Breach, PA-C 75 mL/hr at 06/03/20 0636 Restarted at 06/03/20 9702   Current Outpatient Medications  Medication Sig Dispense Refill  . levothyroxine (SYNTHROID) 75 MCG tablet Take 1 tablet (75 mcg total) by mouth daily. 90 tablet 3  . dicyclomine (BENTYL) 20 MG tablet Take 1 tablet (20 mg total) by mouth 2 (two) times daily for 5 days. (Patient not taking: Reported on 06/02/2020) 10 tablet 0      Allergies  Allergen Reactions  . Other Diarrhea and Other (See Comments)    Omega XL  . Rinvoq [Upadacitinib] Other (See Comments)    Enlarged the patient's liver- had to come to the ED, 2022  :  Family History  Problem Relation Age of Onset  . Graves' disease Mother   . Healthy Father   :  Social History   Socioeconomic History  . Marital status: Single    Spouse name: Not on file  . Number of children: Not on file  . Years of education: Not on file  . Highest education level: Not on file  Occupational History  . Not on file  Tobacco Use  . Smoking status: Never Smoker  . Smokeless tobacco: Never Used  Substance and Sexual Activity  . Alcohol use: Not on file  . Drug use: Never  . Sexual activity: Not on file  Other Topics Concern  . Not on file  Social History Narrative  . Not on file   Social Determinants of Health   Financial Resource Strain: Not on file  Food Insecurity: Not on file  Transportation Needs: Not on file  Physical Activity: Not on file  Stress: Not on file  Social Connections: Not on file  Intimate Partner Violence: Not on file  :  REVIEW OF SYSTEMS:  A comprehensive 14 point review of systems was negative except as noted in the HPI.    Exam: Patient Vitals for the past 24 hrs:  BP Temp Temp src Pulse Resp SpO2  06/03/20 1000 (!) 104/57 - - (!) 107 (!) 22 100 %  06/03/20 0945 (!) 103/54 - - (!) 113 (!) 25 100 %  06/03/20 0919 (!) 107/49 - - (!) 109 (!) 21 99 %  06/03/20 0845 92/60 - - (!) 120 18 100 %  06/03/20  0800 (!) 106/52 - - (!) 116 (!) 23 98 %  06/03/20 0730 102/63 - - (!) 121 (!) 31 98 %  06/03/20 0700 (!) 100/59 - - (!) 131 (!) 29 99 %  06/03/20 0636 - (!) 100.9 F (38.3 C) Oral - - -  06/03/20 0630 (!) 102/52 - - (!) 119 (!) 21 98 %  06/03/20 0600 (!) 101/58 - - (!) 119 (!) 24 100 %  06/03/20 0542 (!) 107/50 - - (!) 123 (!) 25 100 %  06/03/20 0530 (!) 107/50 - - (!) 129 (!) 21 96 %  06/03/20 0515 (!) 105/51 (!) 102.4 F (39.1 C) Oral (!) 121 (!) 27 100 %  06/03/20 0500 (!) 108/45 - - (!) 121 (!) 25 100 %  06/03/20 0451 (!) 108/52 99.9 F (37.7 C) Oral (!) 117 (!)  23 100 %  06/03/20 0445 - - - (!) 117 (!) 22 100 %  06/03/20 0430 (!) 104/51 - - (!) 116 (!) 22 100 %  06/03/20 0415 - - - (!) 116 (!) 23 99 %  06/03/20 0400 (!) 107/46 - - (!) 118 (!) 26 100 %  06/03/20 0345 - - - (!) 116 (!) 25 98 %  06/03/20 0330 (!) 107/41 - - (!) 119 (!) 23 99 %  06/03/20 0315 - - - (!) 112 (!) 24 100 %  06/03/20 0300 (!) 104/56 - - (!) 109 (!) 22 100 %  06/03/20 0230 (!) 107/56 - - (!) 104 (!) 21 98 %  06/03/20 0200 106/68 - - (!) 107 (!) 30 100 %  06/03/20 0120 (!) 106/58 98.7 F (37.1 C) Oral (!) 105 (!) 25 100 %  06/03/20 0100 115/72 - - (!) 125 (!) 24 100 %  06/03/20 0030 (!) 106/59 - - (!) 122 (!) 26 96 %  06/02/20 2354 (!) 108/55 - - (!) 118 (!) 29 96 %  06/02/20 2306 113/66 - - (!) 120 (!) 24 100 %  06/02/20 2230 107/65 - - (!) 111 (!) 23 99 %  06/02/20 2130 (!) 106/58 - - (!) 114 (!) 22 100 %  06/02/20 2123 (!) 105/56 - - (!) 112 (!) 26 100 %  06/02/20 2030 106/63 - - (!) 114 (!) 26 100 %  06/02/20 1900 114/70 - - (!) 113 (!) 27 100 %  06/02/20 1845 113/65 - - (!) 118 (!) 21 100 %  06/02/20 1830 123/72 - - (!) 114 (!) 24 99 %  06/02/20 1815 122/70 - - (!) 110 20 100 %  06/02/20 1715 116/70 - - (!) 115 (!) 27 100 %  06/02/20 1700 101/69 - - (!) 117 (!) 27 100 %  06/02/20 1645 114/76 - - (!) 113 (!) 27 100 %  06/02/20 1637 - 99.7 F (37.6 C) Oral - - -  06/02/20 1552 126/82 - - (!)  124 15 100 %  06/02/20 1453 126/75 - - (!) 133 16 100 %    General: Awake and alert, head turned to the right side of her body to limit pressure on where she hit her head Eyes:  no scleral icterus.   ENT:  There were no oropharyngeal lesions.   Lymphatics:  Negative cervical, supraclavicular or axillary adenopathy.   Respiratory: lungs were clear bilaterally without wheezing or crackles.   Cardiovascular:  Regular rate and rhythm, S1/S2, without murmur, rub or gallop.  She has generalized edema from the pelvis down to her feet. GI: Positive bowel sounds, mild distention, tenderness over the right and left lower quadrants to deep palpation.  Skin exam was without ecchymosis, petechiae.   Neuro exam was nonfocal.  Patient was alert and oriented.  Attention was good.   Language was appropriate.  Mood was normal without depression.  Speech was not pressured.  Thought content was not tangential.    LABS:  Lab Results  Component Value Date   WBC 2.6 (L) 06/03/2020   HGB 7.0 (L) 06/03/2020   HCT 22.2 (L) 06/03/2020   PLT 43 (L) 06/03/2020   GLUCOSE 74 06/03/2020   ALT 27 06/03/2020   AST 84 (H) 06/03/2020   NA 134 (L) 06/03/2020   K 3.1 (L) 06/03/2020   CL 108 06/03/2020   CREATININE 0.46 06/03/2020   BUN <5 (L) 06/03/2020   CO2 20 (L) 06/03/2020   INR 2.6 (H)  06/03/2020    CT HEAD WO CONTRAST  Addendum Date: 06/03/2020   ADDENDUM REPORT: 06/03/2020 09:16 ADDENDUM: Critical Value/emergent results were called by telephone at the time of interpretation on 06/03/2020 at 0904 hours to ED provider Linus Galas PA who verbally acknowledged these results. And she advises the patient is coagulopathic with an INR of 2.6. Electronically Signed   By: Genevie Ann M.D.   On: 06/03/2020 09:16   Result Date: 06/03/2020 CLINICAL DATA:  20 year old female status post head trauma from fall with superior convexity subarachnoid hemorrhage. EXAM: CT HEAD WITHOUT CONTRAST TECHNIQUE: Contiguous axial images  were obtained from the base of the skull through the vertex without intravenous contrast. COMPARISON:  Head CT 0115 hours today. FINDINGS: Brain: New small low-density left side subdural hematoma measures 3-4 mm in thickness (series 3, image 16). There is trace rightward midline shift now. Small volume subarachnoid hemorrhage mostly over the left superior convexity and along the interhemispheric fissure has mildly increased. Superimposed focal hemorrhagic contusions of the right cingulate gyrus (coronal image 21) and left superior frontal gyrus up to 3 cm (coronal image 36) are now apparent with mild regional edema. There is also a more subtle hemorrhagic contusion of the right anterior temporal tip suspected (series 3, image 7 and coronal image 24) with mild edema. Basilar cisterns remain normal. No intraventricular blood or ventriculomegaly. No superimposed acute cortically based infarct. Vascular: No suspicious intracranial vascular hyperdensity. Skull: No skull fracture identified. Sinuses/Orbits: Visualized paranasal sinuses and mastoids are stable and well pneumatized. Other: Expanded and now very large scalp hematoma tracking over most of the superior and left lateral convexity. Areas of hypodense likely hyperacute scalp hemorrhage on series 3, image 18 are new from earlier today. Hematoma thickness up to 16 mm. Visualized orbit soft tissues are within normal limits. IMPRESSION: 1. Multifocal new intracranial hemorrhage, in conjunction with large and expanding left scalp hematoma, raises the possibility of underlying Coagulopathy. 2. New left side subdural hematoma since 0115 hours, 3-4 mm in thickness. 3. Three cerebral hemorrhagic contusions now identified, the largest nearly 3 cm in the left superior frontal gyrus (right cingulate and right anterior temporal tip also). 4. Mildly increased small volume of posttraumatic appearing subarachnoid hemorrhage. 5. Trace rightward midline shift now. Basilar cisterns  remain normal. No IVH or ventriculomegaly. 6. No skull fracture identified. Electronically Signed: By: Genevie Ann M.D. On: 06/03/2020 08:47   CT Head Wo Contrast  Result Date: 06/03/2020 CLINICAL DATA:  Head trauma EXAM: CT HEAD WITHOUT CONTRAST TECHNIQUE: Contiguous axial images were obtained from the base of the skull through the vertex without intravenous contrast. COMPARISON:  None. FINDINGS: Brain: Multifocal acute subarachnoid blood, greatest over the superior parietal lobes. No midline shift or other mass effect. Vascular: No abnormal hyperdensity of the major intracranial arteries or dural venous sinuses. No intracranial atherosclerosis. Skull: Large left parietal scalp hematoma without skull fracture. Sinuses/Orbits: No fluid levels or advanced mucosal thickening of the visualized paranasal sinuses. No mastoid or middle ear effusion. The orbits are normal. IMPRESSION: 1. Multifocal acute subarachnoid blood, greatest over the superior parietal lobes. 2. Large left parietal scalp hematoma without skull fracture. Critical Value/emergent results were called by telephone at the time of interpretation on 06/03/2020 at 1:41 am to provider [facility] , who verbally acknowledged these results. Electronically Signed   By: Ulyses Jarred M.D.   On: 06/03/2020 01:45   CT Angio Chest PE W/Cm &/Or Wo Cm  Result Date: 06/02/2020 CLINICAL DATA:  20 year old female  with abdominal pain and concern for pulmonary embolism. EXAM: CT ANGIOGRAPHY CHEST CT ABDOMEN AND PELVIS WITH CONTRAST TECHNIQUE: Multidetector CT imaging of the chest was performed using the standard protocol during bolus administration of intravenous contrast. Multiplanar CT image reconstructions and MIPs were obtained to evaluate the vascular anatomy. Multidetector CT imaging of the abdomen and pelvis was performed using the standard protocol during bolus administration of intravenous contrast. CONTRAST:  134mL OMNIPAQUE IOHEXOL 350 MG/ML SOLN COMPARISON:   Abdominal ultrasound dated 05/05/2020. FINDINGS: CTA CHEST FINDINGS Cardiovascular: There is no cardiomegaly or pericardial effusion. The thoracic aorta is unremarkable. The origins of the great vessels of the aortic arch appear patent. Faint linear hyperdensity along the left lateral wall of the pulmonary trunk (56/6) likely artifactual. No pulmonary artery embolus identified. Mediastinum/Nodes: There is no hilar or mediastinal adenopathy. The esophagus is grossly unremarkable. No mediastinal fluid collection. Lungs/Pleura: Bibasilar linear atelectasis/scarring. No focal consolidation, pleural effusion, or pneumothorax. The central airways are patent. Musculoskeletal: No chest wall abnormality. No acute or significant osseous findings. Review of the MIP images confirms the above findings. CT ABDOMEN and PELVIS FINDINGS No intra-abdominal free air.  Small ascites. Hepatobiliary: Severe fatty liver. The liver is enlarged measuring 19 cm in midclavicular length. Correlation with clinical exam and LFTs recommended to evaluate for steatohepatitis. No intrahepatic biliary ductal dilatation. The gallbladder is predominantly contracted. No calcified gallstone. Pancreas: Unremarkable. No pancreatic ductal dilatation or surrounding inflammatory changes. Spleen: Normal in size without focal abnormality. Adrenals/Urinary Tract: The adrenal glands are unremarkable. There is no hydronephrosis on either side. The visualized ureters appear unremarkable. The urinary bladder is minimally distended and grossly unremarkable. Stomach/Bowel: There is no bowel obstruction or active inflammation. The appendix is normal. Vascular/Lymphatic: The abdominal aorta and IVC are unremarkable. No portal venous gas. There is no adenopathy. Reproductive: The uterus is anteverted and grossly unremarkable. No adnexal masses. Other: Mild diffuse subcutaneous edema. Musculoskeletal: No acute or significant osseous findings. Review of the MIP images  confirms the above findings. IMPRESSION: 1. No acute intrathoracic pathology. No CT evidence of pulmonary embolism. 2. Severe fatty liver with findings of possible steatohepatitis. Clinical correlation is recommended. 3. Small ascites. 4. No bowel obstruction. Normal appendix. Electronically Signed   By: Anner Crete M.D.   On: 06/02/2020 21:26   CT ABDOMEN PELVIS W CONTRAST  Result Date: 06/02/2020 CLINICAL DATA:  20 year old female with abdominal pain and concern for pulmonary embolism. EXAM: CT ANGIOGRAPHY CHEST CT ABDOMEN AND PELVIS WITH CONTRAST TECHNIQUE: Multidetector CT imaging of the chest was performed using the standard protocol during bolus administration of intravenous contrast. Multiplanar CT image reconstructions and MIPs were obtained to evaluate the vascular anatomy. Multidetector CT imaging of the abdomen and pelvis was performed using the standard protocol during bolus administration of intravenous contrast. CONTRAST:  185mL OMNIPAQUE IOHEXOL 350 MG/ML SOLN COMPARISON:  Abdominal ultrasound dated 05/05/2020. FINDINGS: CTA CHEST FINDINGS Cardiovascular: There is no cardiomegaly or pericardial effusion. The thoracic aorta is unremarkable. The origins of the great vessels of the aortic arch appear patent. Faint linear hyperdensity along the left lateral wall of the pulmonary trunk (56/6) likely artifactual. No pulmonary artery embolus identified. Mediastinum/Nodes: There is no hilar or mediastinal adenopathy. The esophagus is grossly unremarkable. No mediastinal fluid collection. Lungs/Pleura: Bibasilar linear atelectasis/scarring. No focal consolidation, pleural effusion, or pneumothorax. The central airways are patent. Musculoskeletal: No chest wall abnormality. No acute or significant osseous findings. Review of the MIP images confirms the above findings. CT ABDOMEN and PELVIS FINDINGS No  intra-abdominal free air.  Small ascites. Hepatobiliary: Severe fatty liver. The liver is enlarged  measuring 19 cm in midclavicular length. Correlation with clinical exam and LFTs recommended to evaluate for steatohepatitis. No intrahepatic biliary ductal dilatation. The gallbladder is predominantly contracted. No calcified gallstone. Pancreas: Unremarkable. No pancreatic ductal dilatation or surrounding inflammatory changes. Spleen: Normal in size without focal abnormality. Adrenals/Urinary Tract: The adrenal glands are unremarkable. There is no hydronephrosis on either side. The visualized ureters appear unremarkable. The urinary bladder is minimally distended and grossly unremarkable. Stomach/Bowel: There is no bowel obstruction or active inflammation. The appendix is normal. Vascular/Lymphatic: The abdominal aorta and IVC are unremarkable. No portal venous gas. There is no adenopathy. Reproductive: The uterus is anteverted and grossly unremarkable. No adnexal masses. Other: Mild diffuse subcutaneous edema. Musculoskeletal: No acute or significant osseous findings. Review of the MIP images confirms the above findings. IMPRESSION: 1. No acute intrathoracic pathology. No CT evidence of pulmonary embolism. 2. Severe fatty liver with findings of possible steatohepatitis. Clinical correlation is recommended. 3. Small ascites. 4. No bowel obstruction. Normal appendix. Electronically Signed   By: Anner Crete M.D.   On: 06/02/2020 21:26   US Abdomen Limited RUQ (LIVER/GB)  Result Date: 05/05/2020 CLINICAL DATA:  Right upper quadrant pain and vomiting EXAM: ULTRASOUND ABDOMEN LIMITED RIGHT UPPER QUADRANT COMPARISON:  04/09/2020 FINDINGS: Gallbladder: No gallstones or wall thickening visualized. No sonographic Murphy sign noted by sonographer. Common bile duct: Diameter: 2 mm Liver: No focal lesion. Diffusely increased parenchymal echogenicity. Portal vein is patent on color Doppler imaging with normal direction of blood flow towards the liver. Other: None. IMPRESSION: Diffuse increased echogenicity of the  hepatic parenchyma is a nonspecific indicator of hepatocellular dysfunction, most commonly steatosis. Electronically Signed   By: Miachel Roux M.D.   On: 05/05/2020 16:17     ASSESSMENT AND PLAN:  1) Coagulopathy -PT/INR obtained 06/03/2020-27.2/2.6  2) anemia and thrombocytopenia -CBC from 06/02/2020 on admission showed hemoglobin 9.6, hematocrit 30.1, platelets 61,000  3) intracranial hemorrhage following a fall  4) rheumatoid arthritis ?  5) hypothyroidism  PLAN: -Etiology of coagulopathy is unclear.  Recommend additional work-up including PTT, PT/PTT mixing studies, fibrinogen level, and obtain peripheral blood smear for review.  Additional labs per Dr. Irene Limbo later today. -Discussed with PCCM team who was also at the bedside.  Agree with transfusing platelets due to continued drop in her platelet count and ongoing bleeding.  Would recommend maintaining platelet count above 50,000. -Agree with vitamin K to correct INR. -Await repeat coags before determining if she needs additional FFP. -The patient is noted to have a significantly low albumin level.  Would recommend work-up for nutritional deficiency.  Thank you for this referral.  Mikey Bussing, DNP, AGPCNP-BC, AOCNP   ADDENDUM  .Patient was Personally and independently interviewed, examined and relevant elements of the history of present illness were reviewed in details and an assessment and plan was created. All elements of the patient's history of present illness , assessment and plan were discussed in details with Mikey Bussing, DNP, AGPCNP-BC, AOCNP. The above documentation reflects our combined findings assessment and plan.  20 year old female with history of possible rheumatoid arthritis previously on Rinvoq, hypothyroidism admitted with  1) Pancytopenia which appears to have been progressive over the last 1 to 2 months. Anemia could partly be related to blood loss since the patient is complaining of black stools. CT  chest abdomen pelvis showed no other overt internal bleeding or retroperitoneal hematoma. 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  Previous use of Rinvoq -however patient has been off this medication since January. Cannot rule out other bone marrow disorder or significant nutritional deficiencies in presence of severe protein calorie malnutrition.  2) intracranial bleeding in the setting of coagulopathy and fall with head trauma.  3) complex coagulopathy with elevated PT, elevated PTT very severe hypofibrinogenemia. D dimer pending. This could be related to DIC with unclear etiology. Also could possibly be related to severe liver issues given albumin of 1.5 and significant transaminitis from Rinvoq in January. Patient is off [place] since January. Severe protein calorie malnutrition Peripheral blood smear did not show overt blasts but cannot rule out complex coagulopathy from APL in the setting of progressive pancytopenia.  4) hypothyroidism-on replacement  5) history of severe transaminitis from Rinvoq.  6) history rheumatoid arthritis-previously on Rinvoq.  Was to be started on Enbrel.  Currently not on any medications and having significant body aches and joint pains.  PLAN  -discussed with Dr. Tamala Julian would recommend transfer to tertiary care facility given her progressive pancytopenia and complex coagulopathy with intracranial bleeding concern for possible APL. -If this is not possible we will plan to do bone marrow biopsy here. -if ATRA available here would plan to start that pending BM Bx results. -We will place fibrinogen with cryoprecipitate to target a level of 200.  Currently fibrinogen level less than 60.  I have ordered 10 units (2 pooled units) to be given stat.  Please repeat fibrinogen level 30 to 60 minutes after these replace as needed to maintain levels close to 200. -Transfuse platelets as needed to maintain platelet count of more than 50k given CNS bleed and  coagulopathy. -vit K 86m po daily x 5 days -transfuse PRBC for hgb<8 or if symptomatic -would start on low dose prednisone 10 mg po daily for her RA -nutritional consultation to optimize po intake. -PPI for GI prophylaxis -evaluation of other etiology of DIC per critical care team - r/o sepsis etc.   GSullivan LoneMD MS

## 2020-06-03 NOTE — Progress Notes (Signed)
I have reviewed the patient's follow-up head CT performed today at 8:14 a.m.  The patient has a small left hypodense extra-axial fluid collection with mild mass effect.  She has a small right frontal contusion and a small left frontal  Contusion versus interhemispheric subdural hematoma.  There is no significant mass effect.  Assessment plan:  Intracranial hemorrhage:  I would recommend the patient be admitted by the medical service and see a hematologist regarding her coagulopathy.  I will plan to repeat her CT scan tomorrow.  Hopefully she will not come the need surgery given her low platelets and coagulopathy.

## 2020-06-03 NOTE — Progress Notes (Signed)
PCCM interval progress note:  Pt rounded on overnight, she is awake and conversational, protecting her airway and denies complaints.  Continue to monitor closely overnight.  Otilio Carpen Rodger Giangregorio, PA-C

## 2020-06-03 NOTE — Progress Notes (Signed)
Spoke with Dr Charlsie Quest via chat. He wanted to note the urgent need of the bm biopsy for pt. I advised that we did attempt to schedule the pt for tomorrow (06/04/20) but due to schedule we are unable to accommodate and have the sample sent out in the timely manner needed for bm biopsy. I advised that I will inform Dr Kathlene Cote tomorrow morning and be in touch via epic chat.  Currently the pt is scheduled for 06/05/20 0830am.

## 2020-06-03 NOTE — ED Notes (Signed)
Blood bank notified of d/c FFP

## 2020-06-03 NOTE — ED Notes (Signed)
Unable to assess patient due to Provider being at bedside

## 2020-06-03 NOTE — Procedures (Deleted)
Percutaneous Tracheostomy Procedure Note   Josilynn Losh  [id number]  Oct 23, 2000  Date:06/03/20  Time:11:44 AM   Provider Performing:Jamauri Kruzel Cipriano Mile MD supervising Student. Dr. Stann Ore  Procedure: Percutaneous Tracheostomy with Bronchoscopic Guidance (31600)  Indication(s) Persistent respiratory failure  Consent Risks of the procedure as well as the alternatives and risks of each were explained to the patient and/or caregiver.  Consent for the procedure was obtained.  Anesthesia Etomidate, Versed, Fentanyl, Vecuronium   Time Out Verified patient identification, verified procedure, site/side was marked, verified correct patient position, special equipment/implants available, medications/allergies/relevant history reviewed, required imaging and test results available.   Sterile Technique Maximal sterile technique including sterile barrier drape, hand hygiene, sterile gown, sterile gloves, mask, hair covering.    Procedure Description Appropriate anatomy identified by palpation.  Patient's neck prepped and draped in sterile fashion.  1% lidocaine with epinephrine was used to anesthetize skin overlying neck.  1.5cm incision made and blunt dissection performed until tracheal rings could be easily palpated.   Then a size 6-0 Shiley tracheostomy was placed under bronchoscopic visualization using usual Seldinger technique and serial dilation.   Bronchoscope confirmed placement above the carina.  Tracheostomy was sutured in place with adhesive pad to protect skin under pressure.    Patient connected to ventilator.   Complications/Tolerance None; patient tolerated the procedure well. Chest X-ray is ordered to confirm no post-procedural complication.   EBL Minimal   Specimen(s) None

## 2020-06-03 NOTE — ED Notes (Addendum)
This RN witness pt c/o blurry vision occurring on left side. Pt BIL pupils reactive and brisk. Critical Care paged, MD made aware.

## 2020-06-03 NOTE — Progress Notes (Signed)
Lower extremity venous has been completed.   Preliminary results in CV Proc.   Jessica Frazier 06/03/2020 4:18 PM

## 2020-06-03 NOTE — Consult Note (Addendum)
Medical Consultation   Jessica Frazier  GGY:[id number]  DOB: 06-22-2000  DOA: 06/02/2020  PCP: Kathyrn Lass   Requesting physician: Antonietta Breach PA-C  Reason for consultation: S. Tach, liver abnormalities   History of Present Illness: Jessica Frazier is an 20 y.o. female with h/o RA.  Pt was admitted to hospital in Jan with LFT elevations, pancytopenia, and RUQ abd pain.  Ultimately the resident team that admitted her felt that her RA treatment was causing her LFT elevations and so they held her Rinvoq and she was discharged.  She has remained off of all RA meds since Jan.   Patient had originally presented to the ED today for N/V and abd pain after taking Omega XL that she took 1 dose of on 3/17 after she began having worsening pain that she related to her RA.  On 3/18 she began having N/V and abd pain.  No BRB per rectum.  No CP, no SOB, no anticoagulant use, no fever.  Work up today in ED demonstrated that since Jan her AST has trended down from over 1000 to 118 today.  Her anemia is stable at 9.6, hemoccult is negative.  However, her thrombocytopenia has worsened to a platelet count 61, her albumin has dropped to 1.6, Tbili only 1.3.  CT scan of abd/pelvis shows: Severe fatty liver with findings of possible steatohepatitis.  While in the ED today she suffered a mechanical fall, hit head, and has an acute subarachnoid hemorrhage (unclear if SAH secondary to trauma from fall vs spontaneous SAH).  INR has now come back at 2.6.    Review of Systems:  ROS As per HPI otherwise 10 point review of systems negative.     Past Medical History: Past Medical History:  Diagnosis Date  . Bell's palsy   . Hypothyroidism   . Rheumatoid arthritis ([place])     Past Surgical History: History reviewed. No pertinent surgical history.   Allergies:   Allergies  Allergen Reactions  . Other Diarrhea and Other (See Comments)    Omega XL  . Rinvoq [Upadacitinib] Other (See Comments)     Enlarged the patient's liver- had to come to the ED, 2022     Social History:  reports that she has never smoked. She has never used smokeless tobacco. She reports that she does not use drugs. No history on file for alcohol use.   Family History: Family History  Problem Relation Age of Onset  . Graves' disease Mother   . Healthy Father        Physical Exam: Vitals:   06/03/20 0100 06/03/20 0120 06/03/20 0200 06/03/20 0230  BP: 115/72 (!) 106/58 106/68 (!) 107/56  Pulse: (!) 125 (!) 105 (!) 107 (!) 104  Resp: (!) 24 (!) 25 (!) 30 (!) 21  Temp:  98.7 F (37.1 C)    TempSrc:  Oral    SpO2: 100% 100% 100% 98%    Constitutional: Alert and awake, oriented x3, not in any acute distress. Eyes: PERLA, EOMI, irises appear normal, anicteric sclera,  ENMT: external ears and nose appear normal            Lips appears normal, oropharynx mucosa, tongue, posterior pharynx appear normal  Neck: neck appears normal, no masses, normal ROM, no thyromegaly, no JVD  CVS: S1-S2 clear, no murmur rubs or gallops, no LE edema, normal pedal pulses  Respiratory:  clear to auscultation bilaterally, no wheezing,  rales or rhonchi. Respiratory effort normal. No accessory muscle use.  Abdomen: soft nontender, nondistended, normal bowel sounds, no hepatosplenomegaly, no hernias  Musculoskeletal: : no cyanosis, clubbing or edema noted bilaterally Neuro: Cranial nerves II-XII intact, strength, sensation, reflexes Psych: judgement and insight appear normal, stable mood and affect, mental status Skin: no rashes or lesions or ulcers, no induration or nodules    Data reviewed:  I have personally reviewed following labs and imaging studies Labs:  CBC: Recent Labs  Lab 06/02/20 1453  WBC 5.1  HGB 9.6*  HCT 30.1*  MCV 96.8  PLT 61*    Basic Metabolic Panel: Recent Labs  Lab 06/02/20 1453  NA 129*  K 3.4*  CL 98  CO2 23  GLUCOSE 79  BUN <5*  CREATININE 0.50  CALCIUM 7.2*   GFR CrCl  cannot be calculated (Unknown ideal weight.). Liver Function Tests: Recent Labs  Lab 06/02/20 1453  AST 118*  ALT 40  ALKPHOS 169*  BILITOT 1.3*  PROT 6.6  ALBUMIN 1.6*   Recent Labs  Lab 06/02/20 1453  LIPASE 42   No results for input(s): AMMONIA in the last 168 hours. Coagulation profile No results for input(s): INR, PROTIME in the last 168 hours.  Cardiac Enzymes: No results for input(s): CKTOTAL, CKMB, CKMBINDEX, TROPONINI in the last 168 hours. BNP: Invalid input(s): POCBNP CBG: No results for input(s): GLUCAP in the last 168 hours. D-Dimer No results for input(s): DDIMER in the last 72 hours. Hgb A1c No results for input(s): HGBA1C in the last 72 hours. Lipid Profile No results for input(s): CHOL, HDL, LDLCALC, TRIG, CHOLHDL, LDLDIRECT in the last 72 hours. Thyroid function studies Recent Labs    06/02/20 2310  TSH 4.769*   Anemia work up No results for input(s): VITAMINB12, FOLATE, FERRITIN, TIBC, IRON, RETICCTPCT in the last 72 hours. Urinalysis    Component Value Date/Time   COLORURINE AMBER (A) 06/02/2020 1629   APPEARANCEUR HAZY (A) 06/02/2020 1629   LABSPEC 1.018 06/02/2020 1629   PHURINE 6.0 06/02/2020 1629   GLUCOSEU NEGATIVE 06/02/2020 1629   HGBUR NEGATIVE 06/02/2020 1629   BILIRUBINUR NEGATIVE 06/02/2020 1629   KETONESUR NEGATIVE 06/02/2020 1629   PROTEINUR 30 (A) 06/02/2020 1629   NITRITE NEGATIVE 06/02/2020 1629   LEUKOCYTESUR NEGATIVE 06/02/2020 1629     Sepsis Labs Invalid input(s): PROCALCITONIN,  WBC,  LACTICIDVEN Microbiology No results found for this or any previous visit (from the past 240 hour(s)).     Inpatient Medications:   Scheduled Meds: Continuous Infusions:   Radiological Exams on Admission: CT Head Wo Contrast  Result Date: 06/03/2020 CLINICAL DATA:  Head trauma EXAM: CT HEAD WITHOUT CONTRAST TECHNIQUE: Contiguous axial images were obtained from the base of the skull through the vertex without intravenous  contrast. COMPARISON:  None. FINDINGS: Brain: Multifocal acute subarachnoid blood, greatest over the superior parietal lobes. No midline shift or other mass effect. Vascular: No abnormal hyperdensity of the major intracranial arteries or dural venous sinuses. No intracranial atherosclerosis. Skull: Large left parietal scalp hematoma without skull fracture. Sinuses/Orbits: No fluid levels or advanced mucosal thickening of the visualized paranasal sinuses. No mastoid or middle ear effusion. The orbits are normal. IMPRESSION: 1. Multifocal acute subarachnoid blood, greatest over the superior parietal lobes. 2. Large left parietal scalp hematoma without skull fracture. Critical Value/emergent results were called by telephone at the time of interpretation on 06/03/2020 at 1:41 am to provider [facility] , who verbally acknowledged these results. Electronically Signed   By: Lennette Bihari  Collins [place] M.D.   On: 06/03/2020 01:45   CT Angio Chest PE W/Cm &/Or Wo Cm  Result Date: 06/02/2020 CLINICAL DATA:  20 year old female with abdominal pain and concern for pulmonary embolism. EXAM: CT ANGIOGRAPHY CHEST CT ABDOMEN AND PELVIS WITH CONTRAST TECHNIQUE: Multidetector CT imaging of the chest was performed using the standard protocol during bolus administration of intravenous contrast. Multiplanar CT image reconstructions and MIPs were obtained to evaluate the vascular anatomy. Multidetector CT imaging of the abdomen and pelvis was performed using the standard protocol during bolus administration of intravenous contrast. CONTRAST:  174mL OMNIPAQUE IOHEXOL 350 MG/ML SOLN COMPARISON:  Abdominal ultrasound dated 05/05/2020. FINDINGS: CTA CHEST FINDINGS Cardiovascular: There is no cardiomegaly or pericardial effusion. The thoracic aorta is unremarkable. The origins of the great vessels of the aortic arch appear patent. Faint linear hyperdensity along the left lateral wall of the pulmonary trunk (56/6) likely artifactual. No pulmonary artery  embolus identified. Mediastinum/Nodes: There is no hilar or mediastinal adenopathy. The esophagus is grossly unremarkable. No mediastinal fluid collection. Lungs/Pleura: Bibasilar linear atelectasis/scarring. No focal consolidation, pleural effusion, or pneumothorax. The central airways are patent. Musculoskeletal: No chest wall abnormality. No acute or significant osseous findings. Review of the MIP images confirms the above findings. CT ABDOMEN and PELVIS FINDINGS No intra-abdominal free air.  Small ascites. Hepatobiliary: Severe fatty liver. The liver is enlarged measuring 19 cm in midclavicular length. Correlation with clinical exam and LFTs recommended to evaluate for steatohepatitis. No intrahepatic biliary ductal dilatation. The gallbladder is predominantly contracted. No calcified gallstone. Pancreas: Unremarkable. No pancreatic ductal dilatation or surrounding inflammatory changes. Spleen: Normal in size without focal abnormality. Adrenals/Urinary Tract: The adrenal glands are unremarkable. There is no hydronephrosis on either side. The visualized ureters appear unremarkable. The urinary bladder is minimally distended and grossly unremarkable. Stomach/Bowel: There is no bowel obstruction or active inflammation. The appendix is normal. Vascular/Lymphatic: The abdominal aorta and IVC are unremarkable. No portal venous gas. There is no adenopathy. Reproductive: The uterus is anteverted and grossly unremarkable. No adnexal masses. Other: Mild diffuse subcutaneous edema. Musculoskeletal: No acute or significant osseous findings. Review of the MIP images confirms the above findings. IMPRESSION: 1. No acute intrathoracic pathology. No CT evidence of pulmonary embolism. 2. Severe fatty liver with findings of possible steatohepatitis. Clinical correlation is recommended. 3. Small ascites. 4. No bowel obstruction. Normal appendix. Electronically Signed   By: Anner Crete M.D.   On: 06/02/2020 21:26   CT ABDOMEN  PELVIS W CONTRAST  Result Date: 06/02/2020 CLINICAL DATA:  20 year old female with abdominal pain and concern for pulmonary embolism. EXAM: CT ANGIOGRAPHY CHEST CT ABDOMEN AND PELVIS WITH CONTRAST TECHNIQUE: Multidetector CT imaging of the chest was performed using the standard protocol during bolus administration of intravenous contrast. Multiplanar CT image reconstructions and MIPs were obtained to evaluate the vascular anatomy. Multidetector CT imaging of the abdomen and pelvis was performed using the standard protocol during bolus administration of intravenous contrast. CONTRAST:  118mL OMNIPAQUE IOHEXOL 350 MG/ML SOLN COMPARISON:  Abdominal ultrasound dated 05/05/2020. FINDINGS: CTA CHEST FINDINGS Cardiovascular: There is no cardiomegaly or pericardial effusion. The thoracic aorta is unremarkable. The origins of the great vessels of the aortic arch appear patent. Faint linear hyperdensity along the left lateral wall of the pulmonary trunk (56/6) likely artifactual. No pulmonary artery embolus identified. Mediastinum/Nodes: There is no hilar or mediastinal adenopathy. The esophagus is grossly unremarkable. No mediastinal fluid collection. Lungs/Pleura: Bibasilar linear atelectasis/scarring. No focal consolidation, pleural effusion, or pneumothorax. The central airways are patent.  Musculoskeletal: No chest wall abnormality. No acute or significant osseous findings. Review of the MIP images confirms the above findings. CT ABDOMEN and PELVIS FINDINGS No intra-abdominal free air.  Small ascites. Hepatobiliary: Severe fatty liver. The liver is enlarged measuring 19 cm in midclavicular length. Correlation with clinical exam and LFTs recommended to evaluate for steatohepatitis. No intrahepatic biliary ductal dilatation. The gallbladder is predominantly contracted. No calcified gallstone. Pancreas: Unremarkable. No pancreatic ductal dilatation or surrounding inflammatory changes. Spleen: Normal in size without focal  abnormality. Adrenals/Urinary Tract: The adrenal glands are unremarkable. There is no hydronephrosis on either side. The visualized ureters appear unremarkable. The urinary bladder is minimally distended and grossly unremarkable. Stomach/Bowel: There is no bowel obstruction or active inflammation. The appendix is normal. Vascular/Lymphatic: The abdominal aorta and IVC are unremarkable. No portal venous gas. There is no adenopathy. Reproductive: The uterus is anteverted and grossly unremarkable. No adnexal masses. Other: Mild diffuse subcutaneous edema. Musculoskeletal: No acute or significant osseous findings. Review of the MIP images confirms the above findings. IMPRESSION: 1. No acute intrathoracic pathology. No CT evidence of pulmonary embolism. 2. Severe fatty liver with findings of possible steatohepatitis. Clinical correlation is recommended. 3. Small ascites. 4. No bowel obstruction. Normal appendix. Electronically Signed   By: Anner Crete M.D.   On: 06/02/2020 21:26    Impression/Recommendations Principal Problem:   Subarachnoid hemorrhage (HCC) Active Problems:   RA (rheumatoid arthritis) (HCC)   Other cirrhosis of liver (HCC)   Thrombocytopenia (HCC)   Anemia  1. SAH - 1. Unclear if traumatic or spontaneous 1. Did have head trauma in ED before CT scan 2. But odd that the [facility] isnt directly overlying the impact site of head trauma, nor is it contra-coup to this... 2. Complicated by thrombocytopenia 3. Further complicated by INR which just came back at 2.6 4. Defer management to NS as this is outside of my scope of practice. 2. Steatohepatitis / cirrhosis of liver - 1. ? Autoimmune dz given her history of multiple autoimmune issues? 2. Pt not on any RA treatment since Jan 3. Will put in message to GI for AM consult vs outpt follow up if pt doesn't end up getting admitted for [facility] (Message sent to Dr. Collene Mares to this effect). 3. Thrombocytopenia and coagulopathy - 1. Likely secondary  to liver process 2. Defer to NS if platelets or FFP is indicated at this time in setting of acute SAH 4. Anemia - 1. Stable from Jan 2. Hemoccult neg   Thank you for this consultation.  Our [facility] hospitalist team will follow the patient with you.   Time Spent: 80 min  Essa Malachi M. D.O. [facility] 06/03/2020, 3:01 AM

## 2020-06-03 NOTE — Progress Notes (Signed)
Collaborated with team (CT, Cyto, Rad PA) to get patient bm biopsy scheduled 3/24 0830 due to IR schedule on 3/23.  All parties available for procedure Thursday morning 06/05/20 0830am.

## 2020-06-03 NOTE — Progress Notes (Signed)
Date and time results received: 3/22 1533   Test:fibrinogen Critical Value: <60  Name of Provider Notified: Dr. Tamala Julian  Orders Received? Or Actions Taken?: pending

## 2020-06-03 NOTE — ED Provider Notes (Signed)
06:30: Assumed care of patient from Jessica Frazier @ change of shift pending neurosurgery evaluation & disposition.   Please see prior provider notes for full H&P & ED course.  Briefly patient is a 20 year old female with a hx of RA & hypothyroidism who presented to the ED initially with periumbilical abdominal pain w/ N/V after taking an Omega XL. CT A/P with fatty liver changes, LFTs are somewhat elevated however continue to improve since prior admission for transaminitis with pancytopenia in January of 2022 which was felt to be related to her RA medication (Rinvoq) at that time- this was discontinued @ time of discharge.   Hospitalist service was consulted for admission for persistent tachycardia- additional fluids were recommended with re-consult if not improving.   Continued to have tachycardia.  Sustained a fall in the ED, head CT revealed multifocal SAH- initial neurosurgical team consultation did not feel patient needed to be admitted for this based on CT appearance.    Patient platelet count of 61, INR elevated @ 2.6--> received vitamin K, started FFP with subsequent onset of fever therefore this was stopped, rectal tylenol ordered. Pharmacy did not recommend [place]. Hospitalist team agreed to consult but did not feel admission to their service primarily was appropriate. Neurosurgery re-consulted and was requested to evaluate patient in the ED.   08:00 Patient seen by neurosurgeon Dr. Arnoldo Morale who has ordered a head CT wo contrast.   IMPRESSION:  1. Multifocal new intracranial hemorrhage, in conjunction with large  and expanding left scalp hematoma, raises the possibility of  underlying Coagulopathy.  2. New left side subdural hematoma since 0115 hours, 3-4 mm in  thickness.  3. Three cerebral hemorrhagic contusions now identified, the largest  nearly 3 cm in the left superior frontal gyrus (right cingulate and  right anterior temporal tip also).  4. Mildly increased small volume of  posttraumatic appearing  subarachnoid hemorrhage.  5. Trace rightward midline shift now. Basilar cisterns remain  normal. No IVH or ventriculomegaly.  6. No skull fracture identified.      Electronically Signed  By: Genevie Ann M.D.  On: 06/03/2020 08:47     08:50: Consult placed to neurosurgery for re-discussion.   08:57: RE-EVAL: Patient states headache is mild, denies visual disturbance, numbness, or weakness. Sensation grossly intact x 4, 5/5 symmetric grip strength and strength with ankle plantar/dorsiflex  09:02: CONSULT: Discussed with radiologist Dr. Nevada Crane- recommends re-scan in 6-7 hours, also re-scan sooner if acute change in clinical status.   09:10: CONSULT: Discussed with pharmacy- remains with no role for kcentra, recommends repeat INR, if remains >2.0 re-dose vitamin k.   09:25: CONSULT: Discussed with neurosurgeon Dr. Zada Finders- cannot intervene with coagulopathy, recommends hematology consultation & ICU admission. See his note for details. Consultations placed.  09:30: CONSULT: Discussed with NP Georgann Housekeeper- critical care team will see patient in the ED for admission.   09:42: Spoke with Roselyn Reef in cytology lab [place] results should be in within 5-10 minutes.   10:00: Critical care team in the ED evaluating patient.   10:40: Hematology team in the ED as well.   Patient admitted to the ICU.   Findings and plan of care discussed with supervising physician Dr. Roxanne Mins & subsequently Dr. Alvino Chapel @ shift change who are in agreement.    Physical Exam  BP (!) 101/58   Pulse (!) 119   Temp (!) 102.4 F (39.1 C) (Oral)   Resp (!) 24   SpO2 100%   Physical Exam Vitals  and nursing note reviewed.  Eyes:     Extraocular Movements: Extraocular movements intact.     Pupils: Pupils are equal, round, and reactive to light.  Cardiovascular:     Rate and Rhythm: Tachycardia present.  Skin:    General: Skin is warm and dry.  Neurological:     Mental Status: She  is alert.     Comments: Clear speech.  Sensation grossly intact bilateral upper and lower extremities.  5 out of 5 symmetric grip strength.  5 out of 5 strength with plantar dorsiflexion bilaterally.     ED Course/Procedures    .Critical Care Performed by: Amaryllis Dyke, PA-C Authorized by: Amaryllis Dyke, PA-C    CRITICAL CARE Performed by: Kennith Maes   Total critical care time: 35 minutes  Critical care time was exclusive of separately billable procedures and treating other patients.  Critical care was necessary to treat or prevent imminent or life-threatening deterioration.  Critical care was time spent personally by me on the following activities: development of treatment plan with patient and/or surrogate as well as nursing, discussions with consultants, evaluation of patient's response to treatment, examination of patient, obtaining history from patient or surrogate, ordering and performing treatments and interventions, ordering and review of laboratory studies, ordering and review of radiographic studies, pulse oximetry and re-evaluation of patient's condition.  Results for orders placed or performed during the hospital encounter of 06/02/20  SARS CORONAVIRUS 2 (TAT 6-24 HRS) Nasopharyngeal Nasopharyngeal Swab   Specimen: Nasopharyngeal Swab  Result Value Ref Range   SARS Coronavirus 2 NEGATIVE NEGATIVE  Lipase, blood  Result Value Ref Range   Lipase 42 11 - 51 U/L  Comprehensive metabolic panel  Result Value Ref Range   Sodium 129 (L) 135 - 145 mmol/L   Potassium 3.4 (L) 3.5 - 5.1 mmol/L   Chloride 98 98 - 111 mmol/L   CO2 23 22 - 32 mmol/L   Glucose, Bld 79 70 - 99 mg/dL   BUN <5 (L) 6 - 20 mg/dL   Creatinine, Ser 0.50 0.44 - 1.00 mg/dL   Calcium 7.2 (L) 8.9 - 10.3 mg/dL   Total Protein 6.6 6.5 - 8.1 g/dL   Albumin 1.6 (L) 3.5 - 5.0 g/dL   AST 118 (H) 15 - 41 U/L   ALT 40 0 - 44 U/L   Alkaline Phosphatase 169 (H) 38 - 126 U/L   Total  Bilirubin 1.3 (H) 0.3 - 1.2 mg/dL   GFR, Estimated >60 >60 mL/min   Anion gap 8 5 - 15  CBC  Result Value Ref Range   WBC 5.1 4.0 - 10.5 K/uL   RBC 3.11 (L) 3.87 - 5.11 MIL/uL   Hemoglobin 9.6 (L) 12.0 - 15.0 g/dL   HCT 30.1 (L) 36.0 - 46.0 %   MCV 96.8 80.0 - 100.0 fL   MCH 30.9 26.0 - 34.0 pg   MCHC 31.9 30.0 - 36.0 g/dL   RDW 16.9 (H) 11.5 - 15.5 %   Platelets 61 (L) 150 - 400 K/uL   nRBC 0.4 (H) 0.0 - 0.2 %  Urinalysis, Routine w reflex microscopic Urine, Clean Catch  Result Value Ref Range   Color, Urine AMBER (A) YELLOW   APPearance HAZY (A) CLEAR   Specific Gravity, Urine 1.018 1.005 - 1.030   pH 6.0 5.0 - 8.0   Glucose, UA NEGATIVE NEGATIVE mg/dL   Hgb urine dipstick NEGATIVE NEGATIVE   Bilirubin Urine NEGATIVE NEGATIVE   Ketones, ur NEGATIVE NEGATIVE mg/dL  Protein, ur 30 (A) NEGATIVE mg/dL   Nitrite NEGATIVE NEGATIVE   Leukocytes,Ua NEGATIVE NEGATIVE   RBC / HPF 0-5 0 - 5 RBC/hpf   WBC, UA 0-5 0 - 5 WBC/hpf   Bacteria, UA RARE (A) NONE SEEN   Squamous Epithelial / LPF 0-5 0 - 5   Mucus PRESENT   TSH  Result Value Ref Range   TSH 4.769 (H) 0.350 - 4.500 uIU/mL  Protime-INR  Result Value Ref Range   Prothrombin Time 27.2 (H) 11.4 - 15.2 seconds   INR 2.6 (H) 0.8 - 1.2  CBC  Result Value Ref Range   WBC 2.6 (L) 4.0 - 10.5 K/uL   RBC 2.26 (L) 3.87 - 5.11 MIL/uL   Hemoglobin 7.0 (L) 12.0 - 15.0 g/dL   HCT 22.2 (L) 36.0 - 46.0 %   MCV 98.2 80.0 - 100.0 fL   MCH 31.0 26.0 - 34.0 pg   MCHC 31.5 30.0 - 36.0 g/dL   RDW 17.2 (H) 11.5 - 15.5 %   Platelets 43 (L) 150 - 400 K/uL   nRBC 0.8 (H) 0.0 - 0.2 %  Comprehensive metabolic panel  Result Value Ref Range   Sodium 134 (L) 135 - 145 mmol/L   Potassium 3.1 (L) 3.5 - 5.1 mmol/L   Chloride 108 98 - 111 mmol/L   CO2 20 (L) 22 - 32 mmol/L   Glucose, Bld 74 70 - 99 mg/dL   BUN <5 (L) 6 - 20 mg/dL   Creatinine, Ser 0.46 0.44 - 1.00 mg/dL   Calcium 6.8 (L) 8.9 - 10.3 mg/dL   Total Protein 4.9 (L) 6.5 - 8.1 g/dL    Albumin 1.2 (L) 3.5 - 5.0 g/dL   AST 84 (H) 15 - 41 U/L   ALT 27 0 - 44 U/L   Alkaline Phosphatase 120 38 - 126 U/L   Total Bilirubin 1.0 0.3 - 1.2 mg/dL   GFR, Estimated >60 >60 mL/min   Anion gap 6 5 - 15  Protime-INR  Result Value Ref Range   Prothrombin Time 26.8 (H) 11.4 - 15.2 seconds   INR 2.6 (H) 0.8 - 1.2  APTT  Result Value Ref Range   aPTT 78 (H) 24 - 36 seconds  I-Stat beta hCG blood, ED  Result Value Ref Range   I-stat hCG, quantitative <5.0 <5 mIU/mL   Comment 3          POC occult blood, ED RN will collect  Result Value Ref Range   Fecal Occult Bld NEGATIVE NEGATIVE  Type and screen [place]  Result Value Ref Range   ABO/RH(D) O POS    Antibody Screen NEG    Sample Expiration      06/05/2020,2359 Performed at [facility], [address], [place], [place] [address]   ABO/Rh  Result Value Ref Range   ABO/RH(D)      O POS Performed at [facility] [address] [place], [place] [address]   Prepare fresh frozen plasma  Result Value Ref Range   Unit Number [id number]    Blood Component Type THW PLS APHR    Unit division B0    Status of Unit ISSUED    Transfusion Status OK TO TRANSFUSE    Unit Number [id number]    Blood Component Type THAWED PLASMA    Unit division 00    Status of Unit ALLOCATED    Transfusion Status OK TO TRANSFUSE    Unit tag  comment      VERBAL ORDERS PER DR Tamala Julian Performed at [facility], [place] [address], [place], [place] [address]   Prepare Pheresed Platelets  Result Value Ref Range   Unit Number [id number]    Blood Component Type PLTP1 PSORALEN TREATED    Unit division 00    Status of Unit ALLOCATED    Transfusion Status OK TO TRANSFUSE   BPAM FFP  Result Value Ref Range   ISSUE DATE / TIME 932355732202    Blood Product Unit Number [id number]    PRODUCT CODE [id number]    Unit Type and Rh 5100    Blood Product Expiration Date 151761607371    ISSUE DATE / TIME  062694854627    Blood Product Unit Number [id number]    PRODUCT CODE E2720V00    Unit Type and Rh 5100    Blood Product Expiration Date 371696789381   [facility] Platelet Pheresis  Result Value Ref Range   Blood Product Unit Number [id number]    PRODUCT CODE O2423N36    Unit Type and Rh 6200    Blood Product Expiration Date 144315400867    CT HEAD WO CONTRAST  Addendum Date: 06/03/2020   ADDENDUM REPORT: 06/03/2020 09:16 ADDENDUM: Critical Value/emergent results were called by telephone at the time of interpretation on 06/03/2020 at 0904 hours to ED provider Linus Galas PA who verbally acknowledged these results. And she advises the patient is coagulopathic with an INR of 2.6. Electronically Signed   By: Genevie Ann M.D.   On: 06/03/2020 09:16   Result Date: 06/03/2020 CLINICAL DATA:  20 year old female status post head trauma from fall with superior convexity subarachnoid hemorrhage. EXAM: CT HEAD WITHOUT CONTRAST TECHNIQUE: Contiguous axial images were obtained from the base of the skull through the vertex without intravenous contrast. COMPARISON:  Head CT 0115 hours today. FINDINGS: Brain: New small low-density left side subdural hematoma measures 3-4 mm in thickness (series 3, image 16). There is trace rightward midline shift now. Small volume subarachnoid hemorrhage mostly over the left superior convexity and along the interhemispheric fissure has mildly increased. Superimposed focal hemorrhagic contusions of the right cingulate gyrus (coronal image 21) and left superior frontal gyrus up to 3 cm (coronal image 36) are now apparent with mild regional edema. There is also a more subtle hemorrhagic contusion of the right anterior temporal tip suspected (series 3, image 7 and coronal image 24) with mild edema. Basilar cisterns remain normal. No intraventricular blood or ventriculomegaly. No superimposed acute cortically based infarct. Vascular: No suspicious intracranial vascular hyperdensity. Skull:  No skull fracture identified. Sinuses/Orbits: Visualized paranasal sinuses and mastoids are stable and well pneumatized. Other: Expanded and now very large scalp hematoma tracking over most of the superior and left lateral convexity. Areas of hypodense likely hyperacute scalp hemorrhage on series 3, image 18 are new from earlier today. Hematoma thickness up to 16 mm. Visualized orbit soft tissues are within normal limits. IMPRESSION: 1. Multifocal new intracranial hemorrhage, in conjunction with large and expanding left scalp hematoma, raises the possibility of underlying Coagulopathy. 2. New left side subdural hematoma since 0115 hours, 3-4 mm in thickness. 3. Three cerebral hemorrhagic contusions now identified, the largest nearly 3 cm in the left superior frontal gyrus (right cingulate and right anterior temporal tip also). 4. Mildly increased small volume of posttraumatic appearing subarachnoid hemorrhage. 5. Trace rightward midline shift now. Basilar cisterns remain normal. No IVH or ventriculomegaly. 6. No skull fracture identified. Electronically Signed: By: Genevie Ann M.D. On:  06/03/2020 08:47   CT Head Wo Contrast  Result Date: 06/03/2020 CLINICAL DATA:  Head trauma EXAM: CT HEAD WITHOUT CONTRAST TECHNIQUE: Contiguous axial images were obtained from the base of the skull through the vertex without intravenous contrast. COMPARISON:  None. FINDINGS: Brain: Multifocal acute subarachnoid blood, greatest over the superior parietal lobes. No midline shift or other mass effect. Vascular: No abnormal hyperdensity of the major intracranial arteries or dural venous sinuses. No intracranial atherosclerosis. Skull: Large left parietal scalp hematoma without skull fracture. Sinuses/Orbits: No fluid levels or advanced mucosal thickening of the visualized paranasal sinuses. No mastoid or middle ear effusion. The orbits are normal. IMPRESSION: 1. Multifocal acute subarachnoid blood, greatest over the superior parietal  lobes. 2. Large left parietal scalp hematoma without skull fracture. Critical Value/emergent results were called by telephone at the time of interpretation on 06/03/2020 at 1:41 am to provider [facility] , who verbally acknowledged these results. Electronically Signed   By: Ulyses Jarred M.D.   On: 06/03/2020 01:45   CT Angio Chest PE W/Cm &/Or Wo Cm  Result Date: 06/02/2020 CLINICAL DATA:  20 year old female with abdominal pain and concern for pulmonary embolism. EXAM: CT ANGIOGRAPHY CHEST CT ABDOMEN AND PELVIS WITH CONTRAST TECHNIQUE: Multidetector CT imaging of the chest was performed using the standard protocol during bolus administration of intravenous contrast. Multiplanar CT image reconstructions and MIPs were obtained to evaluate the vascular anatomy. Multidetector CT imaging of the abdomen and pelvis was performed using the standard protocol during bolus administration of intravenous contrast. CONTRAST:  174mL OMNIPAQUE IOHEXOL 350 MG/ML SOLN COMPARISON:  Abdominal ultrasound dated 05/05/2020. FINDINGS: CTA CHEST FINDINGS Cardiovascular: There is no cardiomegaly or pericardial effusion. The thoracic aorta is unremarkable. The origins of the great vessels of the aortic arch appear patent. Faint linear hyperdensity along the left lateral wall of the pulmonary trunk (56/6) likely artifactual. No pulmonary artery embolus identified. Mediastinum/Nodes: There is no hilar or mediastinal adenopathy. The esophagus is grossly unremarkable. No mediastinal fluid collection. Lungs/Pleura: Bibasilar linear atelectasis/scarring. No focal consolidation, pleural effusion, or pneumothorax. The central airways are patent. Musculoskeletal: No chest wall abnormality. No acute or significant osseous findings. Review of the MIP images confirms the above findings. CT ABDOMEN and PELVIS FINDINGS No intra-abdominal free air.  Small ascites. Hepatobiliary: Severe fatty liver. The liver is enlarged measuring 19 cm in midclavicular  length. Correlation with clinical exam and LFTs recommended to evaluate for steatohepatitis. No intrahepatic biliary ductal dilatation. The gallbladder is predominantly contracted. No calcified gallstone. Pancreas: Unremarkable. No pancreatic ductal dilatation or surrounding inflammatory changes. Spleen: Normal in size without focal abnormality. Adrenals/Urinary Tract: The adrenal glands are unremarkable. There is no hydronephrosis on either side. The visualized ureters appear unremarkable. The urinary bladder is minimally distended and grossly unremarkable. Stomach/Bowel: There is no bowel obstruction or active inflammation. The appendix is normal. Vascular/Lymphatic: The abdominal aorta and IVC are unremarkable. No portal venous gas. There is no adenopathy. Reproductive: The uterus is anteverted and grossly unremarkable. No adnexal masses. Other: Mild diffuse subcutaneous edema. Musculoskeletal: No acute or significant osseous findings. Review of the MIP images confirms the above findings. IMPRESSION: 1. No acute intrathoracic pathology. No CT evidence of pulmonary embolism. 2. Severe fatty liver with findings of possible steatohepatitis. Clinical correlation is recommended. 3. Small ascites. 4. No bowel obstruction. Normal appendix. Electronically Signed   By: Anner Crete M.D.   On: 06/02/2020 21:26   CT ABDOMEN PELVIS W CONTRAST  Result Date: 06/02/2020 CLINICAL DATA:  20 year old female with  abdominal pain and concern for pulmonary embolism. EXAM: CT ANGIOGRAPHY CHEST CT ABDOMEN AND PELVIS WITH CONTRAST TECHNIQUE: Multidetector CT imaging of the chest was performed using the standard protocol during bolus administration of intravenous contrast. Multiplanar CT image reconstructions and MIPs were obtained to evaluate the vascular anatomy. Multidetector CT imaging of the abdomen and pelvis was performed using the standard protocol during bolus administration of intravenous contrast. CONTRAST:  16mL  OMNIPAQUE IOHEXOL 350 MG/ML SOLN COMPARISON:  Abdominal ultrasound dated 05/05/2020. FINDINGS: CTA CHEST FINDINGS Cardiovascular: There is no cardiomegaly or pericardial effusion. The thoracic aorta is unremarkable. The origins of the great vessels of the aortic arch appear patent. Faint linear hyperdensity along the left lateral wall of the pulmonary trunk (56/6) likely artifactual. No pulmonary artery embolus identified. Mediastinum/Nodes: There is no hilar or mediastinal adenopathy. The esophagus is grossly unremarkable. No mediastinal fluid collection. Lungs/Pleura: Bibasilar linear atelectasis/scarring. No focal consolidation, pleural effusion, or pneumothorax. The central airways are patent. Musculoskeletal: No chest wall abnormality. No acute or significant osseous findings. Review of the MIP images confirms the above findings. CT ABDOMEN and PELVIS FINDINGS No intra-abdominal free air.  Small ascites. Hepatobiliary: Severe fatty liver. The liver is enlarged measuring 19 cm in midclavicular length. Correlation with clinical exam and LFTs recommended to evaluate for steatohepatitis. No intrahepatic biliary ductal dilatation. The gallbladder is predominantly contracted. No calcified gallstone. Pancreas: Unremarkable. No pancreatic ductal dilatation or surrounding inflammatory changes. Spleen: Normal in size without focal abnormality. Adrenals/Urinary Tract: The adrenal glands are unremarkable. There is no hydronephrosis on either side. The visualized ureters appear unremarkable. The urinary bladder is minimally distended and grossly unremarkable. Stomach/Bowel: There is no bowel obstruction or active inflammation. The appendix is normal. Vascular/Lymphatic: The abdominal aorta and IVC are unremarkable. No portal venous gas. There is no adenopathy. Reproductive: The uterus is anteverted and grossly unremarkable. No adnexal masses. Other: Mild diffuse subcutaneous edema. Musculoskeletal: No acute or significant  osseous findings. Review of the MIP images confirms the above findings. IMPRESSION: 1. No acute intrathoracic pathology. No CT evidence of pulmonary embolism. 2. Severe fatty liver with findings of possible steatohepatitis. Clinical correlation is recommended. 3. Small ascites. 4. No bowel obstruction. Normal appendix. Electronically Signed   By: Anner Crete M.D.   On: 06/02/2020 21:26   US Abdomen Limited RUQ (LIVER/GB)  Result Date: 05/05/2020 CLINICAL DATA:  Right upper quadrant pain and vomiting EXAM: ULTRASOUND ABDOMEN LIMITED RIGHT UPPER QUADRANT COMPARISON:  04/09/2020 FINDINGS: Gallbladder: No gallstones or wall thickening visualized. No sonographic Murphy sign noted by sonographer. Common bile duct: Diameter: 2 mm Liver: No focal lesion. Diffusely increased parenchymal echogenicity. Portal vein is patent on color Doppler imaging with normal direction of blood flow towards the liver. Other: None. IMPRESSION: Diffuse increased echogenicity of the hepatic parenchyma is a nonspecific indicator of hepatocellular dysfunction, most commonly steatosis. Electronically Signed   By: Miachel Roux M.D.   On: 05/05/2020 16:17          Amaryllis Dyke, PA-C 06/03/20 1105    Davonna Belling, MD 06/05/20 [phone]

## 2020-06-03 NOTE — Consult Note (Signed)
Chief Complaint: Patient was seen in consultation today for pancytopenia/bone marrow biopsy and aspiration.  Referring Physician(s): Brunetta Genera (oncology)  Supervising Physician: Daryll Brod  Patient Status: [facility] - In-pt  History of Present Illness: Jessica Frazier is a 20 y.o. female with a past medical history of hypothyroidism, RA, and Bell's palsy. She presented to [facility] ED 06/02/2020 with complaint of abdominal pain. In ED, patient suffered a mechanical fall resulting in [facility]- neurosurgery was consulted, follow-up CT revealed expansion of SAH. She was admitted for further management. In addition, found to be thrombocytopenic and coagulopathic. Oncology was consulted for further management who recommended IR consult for possible bone marrow biopsy/aspiration to rule-out bone marrow disorder (?possible APL).  IR consulted by Dr. Irene Limbo for possible image-guided bone marrow biopsy/aspiration. Patient awake and alert laying in bed with no complaints at this time. Denies fever, chills, chest pain, dyspnea, abdominal pain, or headache.   Past Medical History:  Diagnosis Date  . Bell's palsy   . Hypothyroidism   . Rheumatoid arthritis ([place])     History reviewed. No pertinent surgical history.  Allergies: Other and Rinvoq [upadacitinib]  Medications: Prior to Admission medications   Medication Sig Start Date End Date Taking? Authorizing Provider  levothyroxine (SYNTHROID) 75 MCG tablet Take 1 tablet (75 mcg total) by mouth daily. 08/08/19  Yes Shamleffer, Melanie Crazier, MD  dicyclomine (BENTYL) 20 MG tablet Take 1 tablet (20 mg total) by mouth 2 (two) times daily for 5 days. Patient not taking: Reported on 06/02/2020 05/05/20 05/10/20  Volanda Napoleon, PA-C     Family History  Problem Relation Age of Onset  . Graves' disease Mother   . Healthy Father   . Diabetes Maternal Grandfather     Social History   Socioeconomic History  . Marital status: Single    Spouse  name: Not on file  . Number of children: Not on file  . Years of education: Not on file  . Highest education level: Not on file  Occupational History  . Not on file  Tobacco Use  . Smoking status: Never Smoker  . Smokeless tobacco: Never Used  Substance and Sexual Activity  . Alcohol use: Not Currently  . Drug use: Never  . Sexual activity: Not on file  Other Topics Concern  . Not on file  Social History Narrative  . Not on file   Social Determinants of Health   Financial Resource Strain: Not on file  Food Insecurity: Not on file  Transportation Needs: Not on file  Physical Activity: Not on file  Stress: Not on file  Social Connections: Not on file     Review of Systems: A 12 point ROS discussed and pertinent positives are indicated in the HPI above.  All other systems are negative.  Review of Systems  Constitutional: Negative for chills and fever.  Respiratory: Negative for shortness of breath and wheezing.   Cardiovascular: Negative for chest pain and palpitations.  Gastrointestinal: Negative for abdominal pain.  Neurological: Negative for headaches.  Psychiatric/Behavioral: Negative for behavioral problems and confusion.    Vital Signs: BP 109/68   Pulse (!) 102   Temp 98.7 F (37.1 C) (Oral)   Resp (!) 23   Ht 5' 2"  (1.575 m)   Wt 118 lb 2.7 oz (53.6 kg)   SpO2 100%   BMI 21.61 kg/m   Physical Exam Vitals and nursing note reviewed.  Constitutional:      General: She is not in acute distress. Cardiovascular:  Rate and Rhythm: Regular rhythm. Tachycardia present.     Heart sounds: Normal heart sounds.  Pulmonary:     Effort: Pulmonary effort is normal. No respiratory distress.     Breath sounds: Normal breath sounds. No wheezing.  Skin:    General: Skin is warm and dry.  Neurological:     Mental Status: She is alert and oriented to person, place, and time.      MD Evaluation Airway: WNL Heart: WNL Abdomen: WNL ASA  Classification:  3 Mallampati/Airway Score: Two   Imaging: CT HEAD WO CONTRAST  Addendum Date: 06/03/2020   ADDENDUM REPORT: 06/03/2020 09:16 ADDENDUM: Critical Value/emergent results were called by telephone at the time of interpretation on 06/03/2020 at 0904 hours to ED provider Linus Galas PA who verbally acknowledged these results. And she advises the patient is coagulopathic with an INR of 2.6. Electronically Signed   By: Genevie Ann M.D.   On: 06/03/2020 09:16   Result Date: 06/03/2020 CLINICAL DATA:  20 year old female status post head trauma from fall with superior convexity subarachnoid hemorrhage. EXAM: CT HEAD WITHOUT CONTRAST TECHNIQUE: Contiguous axial images were obtained from the base of the skull through the vertex without intravenous contrast. COMPARISON:  Head CT 0115 hours today. FINDINGS: Brain: New small low-density left side subdural hematoma measures 3-4 mm in thickness (series 3, image 16). There is trace rightward midline shift now. Small volume subarachnoid hemorrhage mostly over the left superior convexity and along the interhemispheric fissure has mildly increased. Superimposed focal hemorrhagic contusions of the right cingulate gyrus (coronal image 21) and left superior frontal gyrus up to 3 cm (coronal image 36) are now apparent with mild regional edema. There is also a more subtle hemorrhagic contusion of the right anterior temporal tip suspected (series 3, image 7 and coronal image 24) with mild edema. Basilar cisterns remain normal. No intraventricular blood or ventriculomegaly. No superimposed acute cortically based infarct. Vascular: No suspicious intracranial vascular hyperdensity. Skull: No skull fracture identified. Sinuses/Orbits: Visualized paranasal sinuses and mastoids are stable and well pneumatized. Other: Expanded and now very large scalp hematoma tracking over most of the superior and left lateral convexity. Areas of hypodense likely hyperacute scalp hemorrhage on series 3, image  18 are new from earlier today. Hematoma thickness up to 16 mm. Visualized orbit soft tissues are within normal limits. IMPRESSION: 1. Multifocal new intracranial hemorrhage, in conjunction with large and expanding left scalp hematoma, raises the possibility of underlying Coagulopathy. 2. New left side subdural hematoma since 0115 hours, 3-4 mm in thickness. 3. Three cerebral hemorrhagic contusions now identified, the largest nearly 3 cm in the left superior frontal gyrus (right cingulate and right anterior temporal tip also). 4. Mildly increased small volume of posttraumatic appearing subarachnoid hemorrhage. 5. Trace rightward midline shift now. Basilar cisterns remain normal. No IVH or ventriculomegaly. 6. No skull fracture identified. Electronically Signed: By: Genevie Ann M.D. On: 06/03/2020 08:47   CT Head Wo Contrast  Result Date: 06/03/2020 CLINICAL DATA:  Head trauma EXAM: CT HEAD WITHOUT CONTRAST TECHNIQUE: Contiguous axial images were obtained from the base of the skull through the vertex without intravenous contrast. COMPARISON:  None. FINDINGS: Brain: Multifocal acute subarachnoid blood, greatest over the superior parietal lobes. No midline shift or other mass effect. Vascular: No abnormal hyperdensity of the major intracranial arteries or dural venous sinuses. No intracranial atherosclerosis. Skull: Large left parietal scalp hematoma without skull fracture. Sinuses/Orbits: No fluid levels or advanced mucosal thickening of the visualized paranasal sinuses. No mastoid  or middle ear effusion. The orbits are normal. IMPRESSION: 1. Multifocal acute subarachnoid blood, greatest over the superior parietal lobes. 2. Large left parietal scalp hematoma without skull fracture. Critical Value/emergent results were called by telephone at the time of interpretation on 06/03/2020 at 1:41 am to provider [facility] , who verbally acknowledged these results. Electronically Signed   By: Ulyses Jarred M.D.   On: 06/03/2020  01:45   CT Angio Chest PE W/Cm &/Or Wo Cm  Result Date: 06/02/2020 CLINICAL DATA:  20 year old female with abdominal pain and concern for pulmonary embolism. EXAM: CT ANGIOGRAPHY CHEST CT ABDOMEN AND PELVIS WITH CONTRAST TECHNIQUE: Multidetector CT imaging of the chest was performed using the standard protocol during bolus administration of intravenous contrast. Multiplanar CT image reconstructions and MIPs were obtained to evaluate the vascular anatomy. Multidetector CT imaging of the abdomen and pelvis was performed using the standard protocol during bolus administration of intravenous contrast. CONTRAST:  171m OMNIPAQUE IOHEXOL 350 MG/ML SOLN COMPARISON:  Abdominal ultrasound dated 05/05/2020. FINDINGS: CTA CHEST FINDINGS Cardiovascular: There is no cardiomegaly or pericardial effusion. The thoracic aorta is unremarkable. The origins of the great vessels of the aortic arch appear patent. Faint linear hyperdensity along the left lateral wall of the pulmonary trunk (56/6) likely artifactual. No pulmonary artery embolus identified. Mediastinum/Nodes: There is no hilar or mediastinal adenopathy. The esophagus is grossly unremarkable. No mediastinal fluid collection. Lungs/Pleura: Bibasilar linear atelectasis/scarring. No focal consolidation, pleural effusion, or pneumothorax. The central airways are patent. Musculoskeletal: No chest wall abnormality. No acute or significant osseous findings. Review of the MIP images confirms the above findings. CT ABDOMEN and PELVIS FINDINGS No intra-abdominal free air.  Small ascites. Hepatobiliary: Severe fatty liver. The liver is enlarged measuring 19 cm in midclavicular length. Correlation with clinical exam and LFTs recommended to evaluate for steatohepatitis. No intrahepatic biliary ductal dilatation. The gallbladder is predominantly contracted. No calcified gallstone. Pancreas: Unremarkable. No pancreatic ductal dilatation or surrounding inflammatory changes. Spleen:  Normal in size without focal abnormality. Adrenals/Urinary Tract: The adrenal glands are unremarkable. There is no hydronephrosis on either side. The visualized ureters appear unremarkable. The urinary bladder is minimally distended and grossly unremarkable. Stomach/Bowel: There is no bowel obstruction or active inflammation. The appendix is normal. Vascular/Lymphatic: The abdominal aorta and IVC are unremarkable. No portal venous gas. There is no adenopathy. Reproductive: The uterus is anteverted and grossly unremarkable. No adnexal masses. Other: Mild diffuse subcutaneous edema. Musculoskeletal: No acute or significant osseous findings. Review of the MIP images confirms the above findings. IMPRESSION: 1. No acute intrathoracic pathology. No CT evidence of pulmonary embolism. 2. Severe fatty liver with findings of possible steatohepatitis. Clinical correlation is recommended. 3. Small ascites. 4. No bowel obstruction. Normal appendix. Electronically Signed   By: AAnner CreteM.D.   On: 06/02/2020 21:26   CT ABDOMEN PELVIS W CONTRAST  Result Date: 06/02/2020 CLINICAL DATA:  20year old female with abdominal pain and concern for pulmonary embolism. EXAM: CT ANGIOGRAPHY CHEST CT ABDOMEN AND PELVIS WITH CONTRAST TECHNIQUE: Multidetector CT imaging of the chest was performed using the standard protocol during bolus administration of intravenous contrast. Multiplanar CT image reconstructions and MIPs were obtained to evaluate the vascular anatomy. Multidetector CT imaging of the abdomen and pelvis was performed using the standard protocol during bolus administration of intravenous contrast. CONTRAST:  1030mOMNIPAQUE IOHEXOL 350 MG/ML SOLN COMPARISON:  Abdominal ultrasound dated 05/05/2020. FINDINGS: CTA CHEST FINDINGS Cardiovascular: There is no cardiomegaly or pericardial effusion. The thoracic aorta is unremarkable. The origins  of the great vessels of the aortic arch appear patent. Faint linear hyperdensity  along the left lateral wall of the pulmonary trunk (56/6) likely artifactual. No pulmonary artery embolus identified. Mediastinum/Nodes: There is no hilar or mediastinal adenopathy. The esophagus is grossly unremarkable. No mediastinal fluid collection. Lungs/Pleura: Bibasilar linear atelectasis/scarring. No focal consolidation, pleural effusion, or pneumothorax. The central airways are patent. Musculoskeletal: No chest wall abnormality. No acute or significant osseous findings. Review of the MIP images confirms the above findings. CT ABDOMEN and PELVIS FINDINGS No intra-abdominal free air.  Small ascites. Hepatobiliary: Severe fatty liver. The liver is enlarged measuring 19 cm in midclavicular length. Correlation with clinical exam and LFTs recommended to evaluate for steatohepatitis. No intrahepatic biliary ductal dilatation. The gallbladder is predominantly contracted. No calcified gallstone. Pancreas: Unremarkable. No pancreatic ductal dilatation or surrounding inflammatory changes. Spleen: Normal in size without focal abnormality. Adrenals/Urinary Tract: The adrenal glands are unremarkable. There is no hydronephrosis on either side. The visualized ureters appear unremarkable. The urinary bladder is minimally distended and grossly unremarkable. Stomach/Bowel: There is no bowel obstruction or active inflammation. The appendix is normal. Vascular/Lymphatic: The abdominal aorta and IVC are unremarkable. No portal venous gas. There is no adenopathy. Reproductive: The uterus is anteverted and grossly unremarkable. No adnexal masses. Other: Mild diffuse subcutaneous edema. Musculoskeletal: No acute or significant osseous findings. Review of the MIP images confirms the above findings. IMPRESSION: 1. No acute intrathoracic pathology. No CT evidence of pulmonary embolism. 2. Severe fatty liver with findings of possible steatohepatitis. Clinical correlation is recommended. 3. Small ascites. 4. No bowel obstruction. Normal  appendix. Electronically Signed   By: Anner Crete M.D.   On: 06/02/2020 21:26   US Abdomen Limited RUQ (LIVER/GB)  Result Date: 05/05/2020 CLINICAL DATA:  Right upper quadrant pain and vomiting EXAM: ULTRASOUND ABDOMEN LIMITED RIGHT UPPER QUADRANT COMPARISON:  04/09/2020 FINDINGS: Gallbladder: No gallstones or wall thickening visualized. No sonographic Murphy sign noted by sonographer. Common bile duct: Diameter: 2 mm Liver: No focal lesion. Diffusely increased parenchymal echogenicity. Portal vein is patent on color Doppler imaging with normal direction of blood flow towards the liver. Other: None. IMPRESSION: Diffuse increased echogenicity of the hepatic parenchyma is a nonspecific indicator of hepatocellular dysfunction, most commonly steatosis. Electronically Signed   By: Miachel Roux M.D.   On: 05/05/2020 16:17    Labs:  CBC: Recent Labs    04/10/20 0149 05/05/20 1227 06/02/20 1453 06/03/20 0803 06/03/20 1110  WBC 2.7* 2.5* 5.1 2.6*  --   HGB 9.6* 10.4* 9.6* 7.0*  --   HCT 29.9* 33.6* 30.1* 22.2*  --   PLT 84* 77* 61* 43* PENDING    COAGS: Recent Labs    04/09/20 1512 06/03/20 0239 06/03/20 0913 06/03/20 1110  INR 1.1 2.6* 2.6* 2.3*  APTT 38*  --  78* 72*    BMP: Recent Labs    04/10/20 0149 05/05/20 1227 06/02/20 1453 06/03/20 0803  NA 138 137 129* 134*  K 3.5 3.7 3.4* 3.1*  CL 105 106 98 108  CO2 25 22 23  20*  GLUCOSE 100* 84 79 74  BUN <5* 5* <5* <5*  CALCIUM 8.1* 7.5* 7.2* 6.8*  CREATININE 0.61 0.53 0.50 0.46  GFRNONAA >60 >60 >60 >60    LIVER FUNCTION TESTS: Recent Labs    04/10/20 0149 05/05/20 1227 06/02/20 1453 06/03/20 0803  BILITOT 1.3* 0.7 1.3* 1.0  AST 1,004* 221* 118* 84*  ALT 270* 47* 40 27  ALKPHOS 170* 176* 169* 120  PROT 7.1 6.4* 6.6 4.9*  ALBUMIN 2.6* 2.1* 1.6* 1.2*     Assessment and Plan:  Progressive pancytopenia in setting of complex coagulopathy. Plan for image-guided bone marrow biopsy/aspiration in IR  tentatively for Thursday 06/05/2020 pending IR scheduling. Patient will be NPO prior to procedure. Afebrile. CBC with differential ordered for Thursday AM.  Risks and benefits discussed with the patient including, but not limited to bleeding, infection, damage to adjacent structures or low yield requiring additional tests. All of the patient's questions were answered, patient is agreeable to proceed. Consent signed and in IR control room.   Thank you for this interesting consult.  I greatly enjoyed meeting Jessica Frazier and look forward to participating in their care.  A copy of this report was sent to the requesting provider on this date.  Electronically Signed: Earley Abide, PA-C 06/03/2020, 3:25 PM   I spent a total of 20 Minutes in face to face in clinical consultation, greater than 50% of which was counseling/coordinating care for pancytopenia/bone marrow biopsy and aspiration.

## 2020-06-03 NOTE — ED Notes (Addendum)
Per RN Dacia, pt temperature rose from 99 F to 102 after FFP started. Pt given tylenol and FFP stopped at this time due to possible reaction. FFP to be held per PA Petrucelli.

## 2020-06-03 NOTE — ED Notes (Signed)
Attempted report x1. 

## 2020-06-04 ENCOUNTER — Inpatient Hospital Stay (HOSPITAL_COMMUNITY): Payer: 59

## 2020-06-04 DIAGNOSIS — C9240 Acute promyelocytic leukemia, not having achieved remission: Secondary | ICD-10-CM

## 2020-06-04 DIAGNOSIS — S06360A Traumatic hemorrhage of cerebrum, unspecified, without loss of consciousness, initial encounter: Secondary | ICD-10-CM

## 2020-06-04 DIAGNOSIS — D688 Other specified coagulation defects: Secondary | ICD-10-CM | POA: Diagnosis not present

## 2020-06-04 DIAGNOSIS — I609 Nontraumatic subarachnoid hemorrhage, unspecified: Secondary | ICD-10-CM | POA: Diagnosis not present

## 2020-06-04 LAB — COMPREHENSIVE METABOLIC PANEL
ALT: 29 U/L (ref 0–44)
ALT: 28 U/L (ref 0–44)
AST: 76 U/L — ABNORMAL HIGH (ref 15–41)
AST: 60 U/L — ABNORMAL HIGH (ref 15–41)
Albumin: 1.5 g/dL — ABNORMAL LOW (ref 3.5–5.0)
Albumin: 2.2 g/dL — ABNORMAL LOW (ref 3.5–5.0)
Alkaline Phosphatase: 136 U/L — ABNORMAL HIGH (ref 38–126)
Alkaline Phosphatase: 122 U/L (ref 38–126)
Anion gap: 5 (ref 5–15)
Anion gap: 8 (ref 5–15)
BUN: <5 mg/dL — ABNORMAL LOW (ref 6–20)
BUN: 6 mg/dL (ref 6–20)
CO2: 22 mmol/L (ref 22–32)
CO2: 23 mmol/L (ref 22–32)
Calcium: 7.2 mg/dL — ABNORMAL LOW (ref 8.9–10.3)
Calcium: 7.8 mg/dL — ABNORMAL LOW (ref 8.9–10.3)
Chloride: 106 mmol/L (ref 98–111)
Chloride: 105 mmol/L (ref 98–111)
Creatinine, Ser: 0.39 mg/dL — ABNORMAL LOW (ref 0.44–1.00)
Creatinine, Ser: 0.42 mg/dL — ABNORMAL LOW (ref 0.44–1.00)
GFR, Estimated: >60 mL/min (ref >60)
GFR, Estimated: >60 mL/min (ref >60)
Glucose, Bld: 109 mg/dL — ABNORMAL HIGH (ref 70–99)
Glucose, Bld: 104 mg/dL — ABNORMAL HIGH (ref 70–99)
Potassium: 4.3 mmol/L (ref 3.5–5.1)
Potassium: 4 mmol/L (ref 3.5–5.1)
Sodium: 133 mmol/L — ABNORMAL LOW (ref 135–145)
Sodium: 136 mmol/L (ref 135–145)
Total Bilirubin: 0.9 mg/dL (ref 0.3–1.2)
Total Bilirubin: 0.8 mg/dL (ref 0.3–1.2)
Total Protein: 5.7 g/dL — ABNORMAL LOW (ref 6.5–8.1)
Total Protein: 6.5 g/dL (ref 6.5–8.1)

## 2020-06-04 LAB — CBC
HCT: 22.2 % — ABNORMAL LOW (ref 36.0–46.0)
HCT: 20.9 % — ABNORMAL LOW (ref 36.0–46.0)
HCT: 24.1 % — ABNORMAL LOW (ref 36.0–46.0)
Hemoglobin: 7.3 g/dL — ABNORMAL LOW (ref 12.0–15.0)
Hemoglobin: 6.8 g/dL — CL (ref 12.0–15.0)
Hemoglobin: 7.8 g/dL — ABNORMAL LOW (ref 12.0–15.0)
MCH: 31.6 pg (ref 26.0–34.0)
MCH: 31.2 pg (ref 26.0–34.0)
MCH: 30.1 pg (ref 26.0–34.0)
MCHC: 32.9 g/dL (ref 30.0–36.0)
MCHC: 32.5 g/dL (ref 30.0–36.0)
MCHC: 32.4 g/dL (ref 30.0–36.0)
MCV: 96.1 fL (ref 80.0–100.0)
MCV: 95.9 fL (ref 80.0–100.0)
MCV: 93.1 fL (ref 80.0–100.0)
Platelets: 77 10*3/uL — ABNORMAL LOW (ref 150–400)
Platelets: 80 10*3/uL — ABNORMAL LOW (ref 150–400)
Platelets: 157 10*3/uL (ref 150–400)
RBC: 2.31 MIL/uL — ABNORMAL LOW (ref 3.87–5.11)
RBC: 2.18 MIL/uL — ABNORMAL LOW (ref 3.87–5.11)
RBC: 2.59 MIL/uL — ABNORMAL LOW (ref 3.87–5.11)
RDW: 17.4 % — ABNORMAL HIGH (ref 11.5–15.5)
RDW: 17.6 % — ABNORMAL HIGH (ref 11.5–15.5)
RDW: 18.7 % — ABNORMAL HIGH (ref 11.5–15.5)
WBC: 2.1 10*3/uL — ABNORMAL LOW (ref 4.0–10.5)
WBC: 3.3 10*3/uL — ABNORMAL LOW (ref 4.0–10.5)
WBC: 4.4 10*3/uL (ref 4.0–10.5)
nRBC: 0 % (ref 0.0–0.2)
nRBC: 0 % (ref 0.0–0.2)
nRBC: 0 % (ref 0.0–0.2)

## 2020-06-04 LAB — DIC (DISSEMINATED INTRAVASCULAR COAGULATION)PANEL
D-Dimer, Quant: 4.19 ug/mL-FEU — ABNORMAL HIGH (ref 0.00–0.50)
Fibrinogen: <60 mg/dL — CL (ref 210–475)
INR: 2.3 — ABNORMAL HIGH (ref 0.8–1.2)
Platelets: 68 10*3/uL — ABNORMAL LOW (ref 150–400)
Prothrombin Time: 24.4 seconds — ABNORMAL HIGH (ref 11.4–15.2)
Smear Review: NONE SEEN
aPTT: 72 seconds — ABNORMAL HIGH (ref 24–36)

## 2020-06-04 LAB — GLUCOSE, CAPILLARY
Glucose-Capillary: 100 mg/dL — ABNORMAL HIGH (ref 70–99)
Glucose-Capillary: 104 mg/dL — ABNORMAL HIGH (ref 70–99)
Glucose-Capillary: 105 mg/dL — ABNORMAL HIGH (ref 70–99)
Glucose-Capillary: 115 mg/dL — ABNORMAL HIGH (ref 70–99)
Glucose-Capillary: 117 mg/dL — ABNORMAL HIGH (ref 70–99)
Glucose-Capillary: 87 mg/dL (ref 70–99)
Glucose-Capillary: 98 mg/dL (ref 70–99)

## 2020-06-04 LAB — PROTIME-INR
INR: 1.3 — ABNORMAL HIGH (ref 0.8–1.2)
INR: 1.2 (ref 0.8–1.2)
INR: 1.1 (ref 0.8–1.2)
INR: 1.1 (ref 0.8–1.2)
Prothrombin Time: 15.2 seconds (ref 11.4–15.2)
Prothrombin Time: 14.6 seconds (ref 11.4–15.2)
Prothrombin Time: 13.6 seconds (ref 11.4–15.2)
Prothrombin Time: 13.8 seconds (ref 11.4–15.2)

## 2020-06-04 LAB — GLOBAL TEG PANEL
CFF Max Amplitude: 15.7 mm (ref 15–32)
CK with Heparinase (R): 5.6 min (ref 4.3–8.3)
Citrated Functional Fibrinogen: 286.5 mg/dL (ref 278–581)
Citrated Kaolin (K): 2.3 min — ABNORMAL HIGH (ref 0.8–2.1)
Citrated Kaolin (MA): 49 mm — ABNORMAL LOW (ref 52–69)
Citrated Kaolin (R): 5.5 min (ref 4.6–9.1)
Citrated Kaolin Angle: 68.9 deg (ref 63–78)
Citrated Rapid TEG (MA): 47.4 mm — ABNORMAL LOW (ref 52–70)

## 2020-06-04 LAB — PREPARE PLATELET PHERESIS: Unit division: 0

## 2020-06-04 LAB — BPAM PLATELET PHERESIS
Blood Product Expiration Date: 202203242359
ISSUE DATE / TIME: 202203221119
Unit Type and Rh: 6200

## 2020-06-04 LAB — CBC WITH DIFFERENTIAL/PLATELET
Abs Immature Granulocytes: 0.11 10*3/uL — ABNORMAL HIGH (ref 0.00–0.07)
Basophils Absolute: 0 10*3/uL (ref 0.0–0.1)
Basophils Relative: 0 %
Eosinophils Absolute: 0 10*3/uL (ref 0.0–0.5)
Eosinophils Relative: 0 %
HCT: 23.5 % — ABNORMAL LOW (ref 36.0–46.0)
Hemoglobin: 7.8 g/dL — ABNORMAL LOW (ref 12.0–15.0)
Immature Granulocytes: 3 %
Lymphocytes Relative: 34 %
Lymphs Abs: 1.5 10*3/uL (ref 0.7–4.0)
MCH: 30.4 pg (ref 26.0–34.0)
MCHC: 33.2 g/dL (ref 30.0–36.0)
MCV: 91.4 fL (ref 80.0–100.0)
Monocytes Absolute: 0.4 10*3/uL (ref 0.1–1.0)
Monocytes Relative: 8 %
Neutro Abs: 2.4 10*3/uL (ref 1.7–7.7)
Neutrophils Relative %: 55 %
Platelets: 152 10*3/uL (ref 150–400)
RBC: 2.57 MIL/uL — ABNORMAL LOW (ref 3.87–5.11)
RDW: 18.8 % — ABNORMAL HIGH (ref 11.5–15.5)
WBC: 4.4 10*3/uL (ref 4.0–10.5)
nRBC: 0 % (ref 0.0–0.2)

## 2020-06-04 LAB — POCT I-STAT 7, (LYTES, BLD GAS, ICA,H+H)
Acid-Base Excess: 3 mmol/L — ABNORMAL HIGH (ref 0.0–2.0)
Bicarbonate: 26.7 mmol/L (ref 20.0–28.0)
Calcium, Ion: 1.12 mmol/L — ABNORMAL LOW (ref 1.15–1.40)
HCT: 24 % — ABNORMAL LOW (ref 36.0–46.0)
Hemoglobin: 8.2 g/dL — ABNORMAL LOW (ref 12.0–15.0)
O2 Saturation: 95 %
Patient temperature: 98.6
Potassium: 4.7 mmol/L (ref 3.5–5.1)
Sodium: 138 mmol/L (ref 135–145)
TCO2: 28 mmol/L (ref 22–32)
pCO2 arterial: 37.5 mmHg (ref 32.0–48.0)
pH, Arterial: 7.461 — ABNORMAL HIGH (ref 7.350–7.450)
pO2, Arterial: 72 mmHg — ABNORMAL LOW (ref 83.0–108.0)

## 2020-06-04 LAB — URIC ACID: Uric Acid, Serum: 3.5 mg/dL (ref 2.5–7.1)

## 2020-06-04 LAB — PREPARE FRESH FROZEN PLASMA: Unit division: 0

## 2020-06-04 LAB — LACTATE DEHYDROGENASE: LDH: 341 U/L — ABNORMAL HIGH (ref 98–192)

## 2020-06-04 LAB — APTT
aPTT: 42 seconds — ABNORMAL HIGH (ref 24–36)
aPTT: 37 seconds — ABNORMAL HIGH (ref 24–36)

## 2020-06-04 LAB — ENA+DNA/DS+ANTICH+CENTRO+JO...
Chromatin Ab SerPl-aCnc: >8 AI — ABNORMAL HIGH (ref 0.0–0.9)
ENA SM Ab Ser-aCnc: >8 AI — ABNORMAL HIGH (ref 0.0–0.9)
Ribonucleic Protein: 6 AI — ABNORMAL HIGH (ref 0.0–0.9)
SSA (Ro) (ENA) Antibody, IgG: >8 AI — ABNORMAL HIGH (ref 0.0–0.9)
SSB (La) (ENA) Antibody, IgG: 1.1 AI — ABNORMAL HIGH (ref 0.0–0.9)
ds DNA Ab: 88 IU/mL — ABNORMAL HIGH (ref 0–9)

## 2020-06-04 LAB — BPAM CRYOPRECIPITATE
Blood Product Expiration Date: 202203222046
Blood Product Expiration Date: 202203222107
ISSUE DATE / TIME: 202203221553
ISSUE DATE / TIME: 202203221553
Unit Type and Rh: 5100
Unit Type and Rh: 5100

## 2020-06-04 LAB — PREPARE CRYOPRECIPITATE
Unit division: 0
Unit division: 0

## 2020-06-04 LAB — BPAM FFP
Blood Product Expiration Date: 202203262359
Blood Product Expiration Date: 202203262359
ISSUE DATE / TIME: 202203211856
ISSUE DATE / TIME: 202203220427
Unit Type and Rh: 5100
Unit Type and Rh: 5100

## 2020-06-04 LAB — D-DIMER, QUANTITATIVE: D-Dimer, Quant: 3.68 ug/mL-FEU — ABNORMAL HIGH (ref 0.00–0.50)

## 2020-06-04 LAB — EXTRACTABLE NUCLEAR ANTIGEN ANTIBODY
ENA SM Ab Ser-aCnc: >8 AI — ABNORMAL HIGH (ref 0.0–0.9)
Ribonucleic Protein: 7.4 AI — ABNORMAL HIGH (ref 0.0–0.9)
SSA (Ro) (ENA) Antibody, IgG: >8 AI — ABNORMAL HIGH (ref 0.0–0.9)
SSB (La) (ENA) Antibody, IgG: 1.2 AI — ABNORMAL HIGH (ref 0.0–0.9)
Scleroderma (Scl-70) (ENA) Antibody, IgG: <0.2 AI (ref 0.0–0.9)
ds DNA Ab: 109 IU/mL — ABNORMAL HIGH (ref 0–9)

## 2020-06-04 LAB — FIBRINOGEN
Fibrinogen: 133 mg/dL — ABNORMAL LOW (ref 210–475)
Fibrinogen: 166 mg/dL — ABNORMAL LOW (ref 210–475)
Fibrinogen: 244 mg/dL (ref 210–475)

## 2020-06-04 LAB — ANTI-JO 1 ANTIBODY, IGG: Anti JO-1: <0.2 AI (ref 0.0–0.9)

## 2020-06-04 LAB — TRANSFUSION REACTION
DAT C3: NEGATIVE
Post RXN DAT IgG: POSITIVE

## 2020-06-04 LAB — PREPARE RBC (CROSSMATCH)

## 2020-06-04 LAB — MAGNESIUM: Magnesium: 1.9 mg/dL (ref 1.7–2.4)

## 2020-06-04 LAB — RHEUMATOID FACTOR: Rheumatoid fact SerPl-aCnc: 12.3 IU/mL (ref <14.0)

## 2020-06-04 LAB — ANA W/REFLEX IF POSITIVE: Anti Nuclear Antibody (ANA): POSITIVE — AB

## 2020-06-04 LAB — SEDIMENTATION RATE: Sed Rate: 98 mm/hr — ABNORMAL HIGH (ref 0–22)

## 2020-06-04 LAB — PHOSPHORUS: Phosphorus: 3.5 mg/dL (ref 2.5–4.6)

## 2020-06-04 MED ORDER — SODIUM CHLORIDE 0.9 % IV SOLN
1000.0000 mg | INTRAVENOUS | Status: DC
  Administered 2020-06-04: 1000 mg via INTRAVENOUS
  Filled 2020-06-04 (×2): qty 8

## 2020-06-04 MED ORDER — DESMOPRESSIN ACETATE 4 MCG/ML IJ SOLN
0.3000 ug/kg | Freq: Once | INTRAMUSCULAR | Status: AC
  Administered 2020-06-04: 15.6 ug via INTRAVENOUS
  Filled 2020-06-04: qty 4

## 2020-06-04 MED ORDER — B COMPLEX-C PO TABS
1.0000 | ORAL_TABLET | Freq: Every day | ORAL | Status: DC
  Administered 2020-06-06 – 2020-06-16 (×11): 1 via ORAL
  Filled 2020-06-04 (×13): qty 1

## 2020-06-04 MED ORDER — SODIUM CHLORIDE 0.9% IV SOLUTION: 250.0000 mL | Freq: Once | INTRAVENOUS | Status: DC

## 2020-06-04 MED ORDER — HYDROMORPHONE HCL 1 MG/ML IJ SOLN
1.0000 mg | Freq: Once | INTRAMUSCULAR | Status: AC | PRN
  Administered 2020-06-04: 1 mg via INTRAVENOUS
  Filled 2020-06-04: qty 1

## 2020-06-04 MED ORDER — SODIUM CHLORIDE 0.9% FLUSH: 10.0000 mL | INTRAVENOUS | Status: DC | PRN

## 2020-06-04 MED ORDER — PREDNISONE 20 MG PO TABS: 40.0000 mg | ORAL_TABLET | Freq: Every day | ORAL | Status: DC

## 2020-06-04 MED ORDER — ACETAMINOPHEN 325 MG PO TABS: 650.0000 mg | ORAL_TABLET | Freq: Once | ORAL | Status: DC

## 2020-06-04 MED ORDER — HEPARIN SOD (PORK) LOCK FLUSH 100 UNIT/ML IV SOLN
500.0000 [IU] | Freq: Every day | INTRAVENOUS | Status: DC | PRN
  Filled 2020-06-04: qty 5

## 2020-06-04 MED ORDER — HEPARIN SOD (PORK) LOCK FLUSH 100 UNIT/ML IV SOLN
250.0000 [IU] | INTRAVENOUS | Status: DC | PRN
  Filled 2020-06-04: qty 2.5

## 2020-06-04 MED ORDER — SODIUM CHLORIDE 0.9% FLUSH
3.0000 mL | INTRAVENOUS | Status: AC | PRN
  Administered 2020-06-10: 3 mL

## 2020-06-04 MED ORDER — NALOXONE HCL 0.4 MG/ML IJ SOLN: 0.4000 mg | INTRAMUSCULAR | Status: DC | PRN

## 2020-06-04 MED ORDER — MAGNESIUM SULFATE 2 GM/50ML IV SOLN
2.0000 g | Freq: Once | INTRAVENOUS | Status: AC
  Administered 2020-06-05: 2 g via INTRAVENOUS
  Filled 2020-06-04: qty 50

## 2020-06-04 MED ORDER — METHYLPREDNISOLONE SODIUM SUCC 40 MG IJ SOLR: 40.0000 mg | Freq: Once | INTRAMUSCULAR | Status: DC

## 2020-06-04 MED ORDER — SODIUM CHLORIDE 0.9% IV SOLUTION: Freq: Once | INTRAVENOUS | Status: AC

## 2020-06-04 MED ORDER — SODIUM CHLORIDE 0.9% IV SOLUTION
Freq: Once | INTRAVENOUS | Status: AC
  Administered 2020-06-04: 10 mL/h via INTRAVENOUS

## 2020-06-04 MED ORDER — LORAZEPAM 2 MG/ML IJ SOLN
2.0000 mg | Freq: Once | INTRAMUSCULAR | Status: AC | PRN
  Administered 2020-06-04: 2 mg via INTRAVENOUS
  Filled 2020-06-04: qty 1

## 2020-06-04 MED ORDER — LEVOTHYROXINE SODIUM 75 MCG PO TABS
75.0000 ug | ORAL_TABLET | Freq: Every day | ORAL | Status: DC
  Administered 2020-06-06 – 2020-06-16 (×11): 75 ug via ORAL
  Filled 2020-06-04 (×11): qty 1

## 2020-06-04 MED ORDER — ACETAMINOPHEN 325 MG PO TABS
650.0000 mg | ORAL_TABLET | Freq: Once | ORAL | Status: AC | PRN
  Administered 2020-06-05: 650 mg via ORAL
  Filled 2020-06-04: qty 2

## 2020-06-04 MED ORDER — LIDOCAINE HCL (PF) 1 % IJ SOLN
10.0000 mL | INTRAMUSCULAR | Status: AC
  Administered 2020-06-04: 10 mL via INTRADERMAL
  Filled 2020-06-04: qty 10

## 2020-06-04 NOTE — Procedures (Signed)
INDICATION: coagulopathy, r/o APML  Brief examination was performed. ENT: adequate airway clearance Heart: regular rate and rhythm.No Murmurs Lungs: clear to auscultation, no wheezes, normal respiratory effort  Bone Marrow Biopsy and Aspiration Procedure Note   Informed consent was obtained and potential risks including bleeding, infection and pain were reviewed with the patient.  Dr. Daniel Smith participated in the procedure and was at the bedside.    The patient's name, date of birth, identification, consent and allergies were verified prior to the start of procedure and time out was performed.  The right posterior iliac crest was chosen as the site of biopsy.  The skin was prepped with ChloraPrep.   8 cc of 1% lidocaine was used to provide local anaesthesia.   10 cc of bone marrow aspirate was obtained followed by 1cm biopsy.  Pressure was applied to the biopsy site and bandage was placed over the biopsy site. Nursing recommendations: Patient should lie on the back for 30 mins post bone marrow biopsy with VS taken and dressing examined at 30 minute mark.   The procedure was tolerated well. COMPLICATIONS: None BLOOD LOSS: none .  Specimens sent for flow cytometry, cytogenetics and additional studies.  Signed Lindsey C Causey, NP   

## 2020-06-04 NOTE — Progress Notes (Signed)
Hematology Short Note   BM Bx done by Colbert Coyer. Will continue ATRA pending BM Bx prelim results Continue Prednisone 20mg  po BID for now. Will transfuse 1 pooled unit of Cryo --to target fibrinogen level closer to 200 with suspected APL and CNS bleeding Transfuse 1 unit of PRBC, irrad till APL ruled out. To maintain Hgb>8 for symptomatic anemia and in setting of coagulopathy. Will need rpt labs at 1600 for cbc/diff, cmp, uric acid, PT/INR and aPTT and fibrinogen. Appreciate excellent critical care team cares. Rpt CT head and neuro checks -- will defer to CCM and neurosurgery recommendations.  Sullivan Lone MD MS

## 2020-06-04 NOTE — Progress Notes (Addendum)
Cryo and FFP was done by 2100. Lab called and notified. Phlebotomy came and drew labs at 2130.   Urine output is low. 24 hours urine collection initiated at 2100 via [facility]. Will continue to assess.

## 2020-06-04 NOTE — Progress Notes (Signed)
Date and time results received: 06/04/20 @1315   (use smartphrase ".now" to insert current time)  Test: HGB Critical Value: 6.8   Name of Provider Notified: Tamala Julian  Orders Received? Or Actions Taken?: Dr. Tamala Julian placing orders and calling blood bank

## 2020-06-04 NOTE — Progress Notes (Signed)
eLink Physician-Brief Progress Note Patient Name: Jessica Frazier DOB: 2001/01/21 MRN: [id number]   Date of Service  06/04/2020  HPI/Events of Note  Patient has 24 hour urine collection in progress but is having urinary retention.  eICU Interventions  In / Out bladder catheterization / Foley protocol ordered.        Frederik Pear 06/04/2020, 10:51 PM

## 2020-06-04 NOTE — Procedures (Signed)
EEG Report Indication: possible seizure activity in setting of SAH  This study was recorded in the comatose/not medically induced state.  The duration of the study was 27 minutes.  Electrodes were placed according to the International 10/20 system.  Video was reviewed available for clinical correlation as needed.  There was no evidence of organization at any point, with no discernible anterior - posterior voltage or frequency gradient, posterior dominant rhythm or sleep architecture noted at any point. The background instead is comprised of almost exclusively low - moderate amplitude Delta range slowing in the 1 - 2 Hz range.  There was no clear reactivity/state change and minimal variability.  There may have been somewhat lower amplitudes in the left hemisphere as compared to the right, although this was subtle.  Hyperventilation: deferred Photic stimulation: deferred  There are no clear paroxysmal or epileptiform abnormalities  or interhemispheric asymmetries.  Impression:  This is an abnormal study due to diffuse, low - moderate attitude Delta slowing with no organization and minimal evidence of reactivity. The background is continuous however. 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  There were slightly lower amplitudes in the left hemisphere  as compared to the right, particularly the posterior region, possibly suggestive of focal neuronal dysfunction, although this was subtle.   There are no clear epileptiform abnormalities.

## 2020-06-04 NOTE — Progress Notes (Signed)
Bleed worse. Discussed with nearby neurosurgeon (Dr. Ostergard in OR): still room, no immediate indication for transfusion.  Discussed with Heme.  Going to be more aggressive with correcting platelets and cryo.  Discussed case with Dr. Chand: question unusual places of injury, consider vasculitis given underlying autoimmune disorder: low risk to start high dose steroids, ordered.  Patient more somnolent (thought this was due to lingering sedation given for bone marrow biopsy from this AM).  Will give narcan, check ABG, EEG.  Additional 45 mins cc time 

## 2020-06-04 NOTE — Progress Notes (Signed)
EEG completed, results pending. 

## 2020-06-04 NOTE — TOC CAGE-AID Note (Signed)
Transition of Care [facility]) - CAGE-AID Screening   Patient Details  Name: Jessica Frazier MRN: [id number] Date of Birth: 2001-02-24   Elvina Sidle, RN Trauma Response Nurse  06/04/2020, 11:07 AM      CAGE-AID Screening: Substance Abuse Screening unable to be completed due to: : Patient unable to participate (Pt is sedated from procedure)

## 2020-06-04 NOTE — Progress Notes (Signed)
UNASSIGNED PATIENT Subjective: Patient seen and examined chart reviewed.  Discussed with CCM.  We had a discussion with the patient's mother as well about the possibility of an eating disorder but her mother claims most of her problems started in the last 3 to 4 months.  In this period of time she is lost about 40 pounds.  She started on Rinvoq by her rheumatologist Marella Chimes, PA for rheumatoid arthritis, but labs do not seem to support this diagnosis.  Objective: Vital signs in last 24 hours: Temp:  [97.4 F (36.3 C)-98.9 F (37.2 C)] 98.8 F (37.1 C) (03/23 1200) Pulse Rate:  [89-125] 120 (03/23 1200) Resp:  [12-30] 15 (03/23 1200) BP: (104-119)/(58-83) 107/62 (03/23 1200) SpO2:  [97 %-100 %] 97 % (03/23 1200) Weight:  [53.6 kg] 53.6 kg (03/22 1457) Last BM Date: 06/04/20  Intake/Output from previous day: 03/22 0701 - 03/23 0700 In: 1237.5 [I.V.:560; Blood:541; IV Piggyback:136.5] Out: 800 [Urine:800] Intake/Output this shift: Total I/O In: 170.8 [I.V.:50.8; Blood:120] Out: 300 [Urine:300]  General appearance: cachectic, fatigued, no distress, pale and slowed mentation Resp: clear to auscultation bilaterally Cardio: regular rate and rhythm, S1, S2 normal, no murmur, click, rub or gallop GI: soft, non-tender; bowel sounds normal; no masses,  no organomegaly Extremities: extremities normal, atraumatic, no cyanosis or edema  Lab Results: Recent Labs    06/03/20 0803 06/03/20 1110 06/03/20 1940 06/04/20 0502  WBC 2.6*  --  2.4* 2.1*  HGB 7.0*  --  7.4* 7.3*  HCT 22.2*  --  23.1* 22.2*  PLT 43* 68* 61* 77*   BMET Recent Labs    06/02/20 1453 06/03/20 0803 06/04/20 0502  NA 129* 134* 133*  K 3.4* 3.1* 4.3  CL 98 108 106  CO2 23 20* 22  GLUCOSE 79 74 109*  BUN <5* <5* <5*  CREATININE 0.50 0.46 0.39*  CALCIUM 7.2* 6.8* 7.2*   LFT Recent Labs    06/04/20 0502  PROT 5.7*  ALBUMIN 1.5*  AST 76*  ALT 29  ALKPHOS 136*  BILITOT 0.9   PT/INR Recent Labs     06/03/20 1940 06/04/20 0502  LABPROT 15.3* 15.2  INR 1.3* 1.3*   Hepatitis Panel No results for input(s): HEPBSAG, HCVAB, HEPAIGM, HEPBIGM in the last 72 hours. C-Diff No results for input(s): CDIFFTOX in the last 72 hours. No results for input(s): CDIFFPCR in the last 72 hours. Fecal Lactopherrin No results for input(s): FECLLACTOFRN in the last 72 hours.  Studies/Results: CT HEAD WO CONTRAST  Addendum Date: 06/03/2020   ADDENDUM REPORT: 06/03/2020 09:16 ADDENDUM: Critical Value/emergent results were called by telephone at the time of interpretation on 06/03/2020 at 0904 hours to ED provider Linus Galas PA who verbally acknowledged these results. And she advises the patient is coagulopathic with an INR of 2.6. Electronically Signed   By: Genevie Ann M.D.   On: 06/03/2020 09:16   Result Date: 06/03/2020 CLINICAL DATA:  20 year old female status post head trauma from fall with superior convexity subarachnoid hemorrhage. EXAM: CT HEAD WITHOUT CONTRAST TECHNIQUE: Contiguous axial images were obtained from the base of the skull through the vertex without intravenous contrast. COMPARISON:  Head CT 0115 hours today. FINDINGS: Brain: New small low-density left side subdural hematoma measures 3-4 mm in thickness (series 3, image 16). There is trace rightward midline shift now. Small volume subarachnoid hemorrhage mostly over the left superior convexity and along the interhemispheric fissure has mildly increased. Superimposed focal hemorrhagic contusions of the right cingulate gyrus (coronal image 21)  and left superior frontal gyrus up to 3 cm (coronal image 36) are now apparent with mild regional edema. There is also a more subtle hemorrhagic contusion of the right anterior temporal tip suspected (series 3, image 7 and coronal image 24) with mild edema. Basilar cisterns remain normal. No intraventricular blood or ventriculomegaly. No superimposed acute cortically based infarct. Vascular: No suspicious  intracranial vascular hyperdensity. Skull: No skull fracture identified. Sinuses/Orbits: Visualized paranasal sinuses and mastoids are stable and well pneumatized. Other: Expanded and now very large scalp hematoma tracking over most of the superior and left lateral convexity. Areas of hypodense likely hyperacute scalp hemorrhage on series 3, image 18 are new from earlier today. Hematoma thickness up to 16 mm. Visualized orbit soft tissues are within normal limits. IMPRESSION: 1. Multifocal new intracranial hemorrhage, in conjunction with large and expanding left scalp hematoma, raises the possibility of underlying Coagulopathy. 2. New left side subdural hematoma since 0115 hours, 3-4 mm in thickness. 3. Three cerebral hemorrhagic contusions now identified, the largest nearly 3 cm in the left superior frontal gyrus (right cingulate and right anterior temporal tip also). 4. Mildly increased small volume of posttraumatic appearing subarachnoid hemorrhage. 5. Trace rightward midline shift now. Basilar cisterns remain normal. No IVH or ventriculomegaly. 6. No skull fracture identified. Electronically Signed: By: Genevie Ann M.D. On: 06/03/2020 08:47   CT Head Wo Contrast  Result Date: 06/03/2020 CLINICAL DATA:  Head trauma EXAM: CT HEAD WITHOUT CONTRAST TECHNIQUE: Contiguous axial images were obtained from the base of the skull through the vertex without intravenous contrast. COMPARISON:  None. FINDINGS: Brain: Multifocal acute subarachnoid blood, greatest over the superior parietal lobes. No midline shift or other mass effect. Vascular: No abnormal hyperdensity of the major intracranial arteries or dural venous sinuses. No intracranial atherosclerosis. Skull: Large left parietal scalp hematoma without skull fracture. Sinuses/Orbits: No fluid levels or advanced mucosal thickening of the visualized paranasal sinuses. No mastoid or middle ear effusion. The orbits are normal. IMPRESSION: 1. Multifocal acute subarachnoid  blood, greatest over the superior parietal lobes. 2. Large left parietal scalp hematoma without skull fracture. Critical Value/emergent results were called by telephone at the time of interpretation on 06/03/2020 at 1:41 am to provider [facility] , who verbally acknowledged these results. Electronically Signed   By: Ulyses Jarred M.D.   On: 06/03/2020 01:45   CT Angio Chest PE W/Cm &/Or Wo Cm  Result Date: 06/02/2020 CLINICAL DATA:  20 year old female with abdominal pain and concern for pulmonary embolism. EXAM: CT ANGIOGRAPHY CHEST CT ABDOMEN AND PELVIS WITH CONTRAST TECHNIQUE: Multidetector CT imaging of the chest was performed using the standard protocol during bolus administration of intravenous contrast. Multiplanar CT image reconstructions and MIPs were obtained to evaluate the vascular anatomy. Multidetector CT imaging of the abdomen and pelvis was performed using the standard protocol during bolus administration of intravenous contrast. CONTRAST:  128mL OMNIPAQUE IOHEXOL 350 MG/ML SOLN COMPARISON:  Abdominal ultrasound dated 05/05/2020. FINDINGS: CTA CHEST FINDINGS Cardiovascular: There is no cardiomegaly or pericardial effusion. The thoracic aorta is unremarkable. The origins of the great vessels of the aortic arch appear patent. Faint linear hyperdensity along the left lateral wall of the pulmonary trunk (56/6) likely artifactual. No pulmonary artery embolus identified. Mediastinum/Nodes: There is no hilar or mediastinal adenopathy. The esophagus is grossly unremarkable. No mediastinal fluid collection. Lungs/Pleura: Bibasilar linear atelectasis/scarring. No focal consolidation, pleural effusion, or pneumothorax. The central airways are patent. Musculoskeletal: No chest wall abnormality. No acute or significant osseous findings. Review of  the MIP images confirms the above findings. CT ABDOMEN and PELVIS FINDINGS No intra-abdominal free air.  Small ascites. Hepatobiliary: Severe fatty liver. The liver  is enlarged measuring 19 cm in midclavicular length. Correlation with clinical exam and LFTs recommended to evaluate for steatohepatitis. No intrahepatic biliary ductal dilatation. The gallbladder is predominantly contracted. No calcified gallstone. Pancreas: Unremarkable. No pancreatic ductal dilatation or surrounding inflammatory changes. Spleen: Normal in size without focal abnormality. Adrenals/Urinary Tract: The adrenal glands are unremarkable. There is no hydronephrosis on either side. The visualized ureters appear unremarkable. The urinary bladder is minimally distended and grossly unremarkable. Stomach/Bowel: There is no bowel obstruction or active inflammation. The appendix is normal. Vascular/Lymphatic: The abdominal aorta and IVC are unremarkable. No portal venous gas. There is no adenopathy. Reproductive: The uterus is anteverted and grossly unremarkable. No adnexal masses. Other: Mild diffuse subcutaneous edema. Musculoskeletal: No acute or significant osseous findings. Review of the MIP images confirms the above findings. IMPRESSION: 1. No acute intrathoracic pathology. No CT evidence of pulmonary embolism. 2. Severe fatty liver with findings of possible steatohepatitis. Clinical correlation is recommended. 3. Small ascites. 4. No bowel obstruction. Normal appendix. Electronically Signed   By: Anner Crete M.D.   On: 06/02/2020 21:26   CT ABDOMEN PELVIS W CONTRAST  Result Date: 06/02/2020 CLINICAL DATA:  20 year old female with abdominal pain and concern for pulmonary embolism. EXAM: CT ANGIOGRAPHY CHEST CT ABDOMEN AND PELVIS WITH CONTRAST TECHNIQUE: Multidetector CT imaging of the chest was performed using the standard protocol during bolus administration of intravenous contrast. Multiplanar CT image reconstructions and MIPs were obtained to evaluate the vascular anatomy. Multidetector CT imaging of the abdomen and pelvis was performed using the standard protocol during bolus administration of  intravenous contrast. CONTRAST:  120mL OMNIPAQUE IOHEXOL 350 MG/ML SOLN COMPARISON:  Abdominal ultrasound dated 05/05/2020. FINDINGS: CTA CHEST FINDINGS Cardiovascular: There is no cardiomegaly or pericardial effusion. The thoracic aorta is unremarkable. The origins of the great vessels of the aortic arch appear patent. Faint linear hyperdensity along the left lateral wall of the pulmonary trunk (56/6) likely artifactual. No pulmonary artery embolus identified. Mediastinum/Nodes: There is no hilar or mediastinal adenopathy. The esophagus is grossly unremarkable. No mediastinal fluid collection. Lungs/Pleura: Bibasilar linear atelectasis/scarring. No focal consolidation, pleural effusion, or pneumothorax. The central airways are patent. Musculoskeletal: No chest wall abnormality. No acute or significant osseous findings. Review of the MIP images confirms the above findings. CT ABDOMEN and PELVIS FINDINGS No intra-abdominal free air.  Small ascites. Hepatobiliary: Severe fatty liver. The liver is enlarged measuring 19 cm in midclavicular length. Correlation with clinical exam and LFTs recommended to evaluate for steatohepatitis. No intrahepatic biliary ductal dilatation. The gallbladder is predominantly contracted. No calcified gallstone. Pancreas: Unremarkable. No pancreatic ductal dilatation or surrounding inflammatory changes. Spleen: Normal in size without focal abnormality. Adrenals/Urinary Tract: The adrenal glands are unremarkable. There is no hydronephrosis on either side. The visualized ureters appear unremarkable. The urinary bladder is minimally distended and grossly unremarkable. Stomach/Bowel: There is no bowel obstruction or active inflammation. The appendix is normal. Vascular/Lymphatic: The abdominal aorta and IVC are unremarkable. No portal venous gas. There is no adenopathy. Reproductive: The uterus is anteverted and grossly unremarkable. No adnexal masses. Other: Mild diffuse subcutaneous edema.  Musculoskeletal: No acute or significant osseous findings. Review of the MIP images confirms the above findings. IMPRESSION: 1. No acute intrathoracic pathology. No CT evidence of pulmonary embolism. 2. Severe fatty liver with findings of possible steatohepatitis. Clinical correlation is recommended. 3. Small ascites.  4. No bowel obstruction. Normal appendix. Electronically Signed   By: Anner Crete M.D.   On: 06/02/2020 21:26   VAS [place] LOWER EXTREMITY VENOUS (DVT)  Result Date: 06/04/2020  Lower Venous DVT Study Indications: Edema.  Comparison Study: no prior Performing Technologist: Abram Sander RVS  Examination Guidelines: A complete evaluation includes B-mode imaging, spectral Doppler, color Doppler, and power Doppler as needed of all accessible portions of each vessel. Bilateral testing is considered an integral part of a complete examination. Limited examinations for reoccurring indications may be performed as noted. The reflux portion of the exam is performed with the patient in reverse Trendelenburg.  +---------+---------------+---------+-----------+----------+--------------+ RIGHT    CompressibilityPhasicitySpontaneityPropertiesThrombus Aging +---------+---------------+---------+-----------+----------+--------------+ CFV      Full           Yes      Yes                                 +---------+---------------+---------+-----------+----------+--------------+ SFJ      Full                                                        +---------+---------------+---------+-----------+----------+--------------+ FV Prox  Full                                                        +---------+---------------+---------+-----------+----------+--------------+ FV Mid   Full                                                        +---------+---------------+---------+-----------+----------+--------------+ FV DistalFull                                                         +---------+---------------+---------+-----------+----------+--------------+ PFV      Full                                                        +---------+---------------+---------+-----------+----------+--------------+ POP      Full           Yes      Yes                                 +---------+---------------+---------+-----------+----------+--------------+ PTV      Full                                                        +---------+---------------+---------+-----------+----------+--------------+  PERO     Full                                                        +---------+---------------+---------+-----------+----------+--------------+   +---------+---------------+---------+-----------+----------+--------------+ LEFT     CompressibilityPhasicitySpontaneityPropertiesThrombus Aging +---------+---------------+---------+-----------+----------+--------------+ CFV      Full           Yes      Yes                                 +---------+---------------+---------+-----------+----------+--------------+ SFJ      Full                                                        +---------+---------------+---------+-----------+----------+--------------+ FV Prox  Full                                                        +---------+---------------+---------+-----------+----------+--------------+ FV Mid   Full                                                        +---------+---------------+---------+-----------+----------+--------------+ FV DistalFull                                                        +---------+---------------+---------+-----------+----------+--------------+ PFV      Full                                                        +---------+---------------+---------+-----------+----------+--------------+ POP      Full           Yes      Yes                                  +---------+---------------+---------+-----------+----------+--------------+ PTV      Full                                                        +---------+---------------+---------+-----------+----------+--------------+ PERO     Full                                                        +---------+---------------+---------+-----------+----------+--------------+  Summary: BILATERAL: - No evidence of deep vein thrombosis seen in the lower extremities, bilaterally. - No evidence of superficial venous thrombosis in the lower extremities, bilaterally. -No evidence of popliteal cyst, bilaterally.   *See table(s) above for measurements and observations. Electronically signed by Deitra Mayo MD on 06/04/2020 at 5:15:45 AM.    Final    US Abdomen Limited RUQ (LIVER/GB)  Result Date: 06/03/2020 CLINICAL DATA:  Ischemic hepatitis. EXAM: ULTRASOUND ABDOMEN LIMITED RIGHT UPPER QUADRANT COMPARISON:  CT abdomen pelvis 05/05/2020 FINDINGS: Gallbladder: The gallbladder appears to be contracted. No gallstones or wall thickening visualized. No sonographic Murphy sign noted by sonographer. Common bile duct: Diameter: 14mm Liver: Limited evaluation due to positioning. No focal lesion identified. Increased parenchymal echogenicity. Portal vein is patent on color Doppler imaging with normal direction of blood flow towards the liver. Other: None. IMPRESSION: Hepatic steatosis. Please note limited evaluation for focal hepatic masses in a patient with hepatic steatosis due to decreased penetration of the acoustic ultrasound waves. Also limited evaluation due to positioning. Recommend MRI liver protocol. Electronically Signed   By: Iven Finn M.D.   On: 06/03/2020 22:51    Medications: I have reviewed the patient's current medications.  Assessment/Plan: 1) Abnormal LFTs/severe fatty liver/hepatomegaly probably from starvation. 2) Coagulopathy likely from liver disease versus DIC.  Work-up in  progress. 3) Multifocal SAH after a fall-complicated by thrombocytopenia. Not much to add from a GI standpoint please call as needed if help is required in the future.  LOS: 1 day   Juanita Craver 06/04/2020, 1:29 PM

## 2020-06-04 NOTE — Progress Notes (Signed)
Providing Compassionate, Quality Care - Together   Subjective: Patient very drowsy. Per nurse and CCM, patient still sedated from bone marrow biopsy this morning (1 mg Dilaudid and 2 mg Ativan given at 9:07 am).   Objective: Vital signs in last 24 hours: Temp:  [97.9 F (36.6 C)-98.9 F (37.2 C)] 98.7 F (37.1 C) (03/23 0800) Pulse Rate:  [89-125] 125 (03/23 1000) Resp:  [14-32] 14 (03/23 1000) BP: (101-119)/(54-83) 104/68 (03/23 1000) SpO2:  [98 %-100 %] 98 % (03/23 1000) Weight:  [53.6 kg] 53.6 kg (03/22 1457)  Intake/Output from previous day: 03/22 0701 - 03/23 0700 In: 1237.5 [I.V.:560; Blood:541; IV Piggyback:136.5] Out: 800 [Urine:800] Intake/Output this shift: Total I/O In: 0.8 [I.V.:0.8] Out: -   Sedated PERRLA, 2 sluggish Not following commands MAE spontaneously BLE 2+ pitting edema  Lab Results: Recent Labs    06/03/20 1940 06/04/20 0502  WBC 2.4* 2.1*  HGB 7.4* 7.3*  HCT 23.1* 22.2*  PLT 61* 77*   BMET Recent Labs    06/03/20 0803 06/04/20 0502  NA 134* 133*  K 3.1* 4.3  CL 108 106  CO2 20* 22  GLUCOSE 74 109*  BUN <5* <5*  CREATININE 0.46 0.39*  CALCIUM 6.8* 7.2*    Studies/Results: CT HEAD WO CONTRAST  Addendum Date: 06/03/2020   ADDENDUM REPORT: 06/03/2020 09:16 ADDENDUM: Critical Value/emergent results were called by telephone at the time of interpretation on 06/03/2020 at 0904 hours to ED provider Linus Galas PA who verbally acknowledged these results. And she advises the patient is coagulopathic with an INR of 2.6. Electronically Signed   By: Genevie Ann M.D.   On: 06/03/2020 09:16   Result Date: 06/03/2020 CLINICAL DATA:  20 year old female status post head trauma from fall with superior convexity subarachnoid hemorrhage. EXAM: CT HEAD WITHOUT CONTRAST TECHNIQUE: Contiguous axial images were obtained from the base of the skull through the vertex without intravenous contrast. COMPARISON:  Head CT 0115 hours today. FINDINGS: Brain: New  small low-density left side subdural hematoma measures 3-4 mm in thickness (series 3, image 16). There is trace rightward midline shift now. Small volume subarachnoid hemorrhage mostly over the left superior convexity and along the interhemispheric fissure has mildly increased. Superimposed focal hemorrhagic contusions of the right cingulate gyrus (coronal image 21) and left superior frontal gyrus up to 3 cm (coronal image 36) are now apparent with mild regional edema. There is also a more subtle hemorrhagic contusion of the right anterior temporal tip suspected (series 3, image 7 and coronal image 24) with mild edema. Basilar cisterns remain normal. No intraventricular blood or ventriculomegaly. No superimposed acute cortically based infarct. Vascular: No suspicious intracranial vascular hyperdensity. Skull: No skull fracture identified. Sinuses/Orbits: Visualized paranasal sinuses and mastoids are stable and well pneumatized. Other: Expanded and now very large scalp hematoma tracking over most of the superior and left lateral convexity. Areas of hypodense likely hyperacute scalp hemorrhage on series 3, image 18 are new from earlier today. Hematoma thickness up to 16 mm. Visualized orbit soft tissues are within normal limits. IMPRESSION: 1. Multifocal new intracranial hemorrhage, in conjunction with large and expanding left scalp hematoma, raises the possibility of underlying Coagulopathy. 2. New left side subdural hematoma since 0115 hours, 3-4 mm in thickness. 3. Three cerebral hemorrhagic contusions now identified, the largest nearly 3 cm in the left superior frontal gyrus (right cingulate and right anterior temporal tip also). 4. Mildly increased small volume of posttraumatic appearing subarachnoid hemorrhage. 5. Trace rightward midline shift now. Basilar  cisterns remain normal. No IVH or ventriculomegaly. 6. No skull fracture identified. Electronically Signed: By: Genevie Ann M.D. On: 06/03/2020 08:47   CT Head  Wo Contrast  Result Date: 06/03/2020 CLINICAL DATA:  Head trauma EXAM: CT HEAD WITHOUT CONTRAST TECHNIQUE: Contiguous axial images were obtained from the base of the skull through the vertex without intravenous contrast. COMPARISON:  None. FINDINGS: Brain: Multifocal acute subarachnoid blood, greatest over the superior parietal lobes. No midline shift or other mass effect. Vascular: No abnormal hyperdensity of the major intracranial arteries or dural venous sinuses. No intracranial atherosclerosis. Skull: Large left parietal scalp hematoma without skull fracture. Sinuses/Orbits: No fluid levels or advanced mucosal thickening of the visualized paranasal sinuses. No mastoid or middle ear effusion. The orbits are normal. IMPRESSION: 1. Multifocal acute subarachnoid blood, greatest over the superior parietal lobes. 2. Large left parietal scalp hematoma without skull fracture. Critical Value/emergent results were called by telephone at the time of interpretation on 06/03/2020 at 1:41 am to provider [facility] , who verbally acknowledged these results. Electronically Signed   By: Ulyses Jarred M.D.   On: 06/03/2020 01:45   CT Angio Chest PE W/Cm &/Or Wo Cm  Result Date: 06/02/2020 CLINICAL DATA:  20 year old female with abdominal pain and concern for pulmonary embolism. EXAM: CT ANGIOGRAPHY CHEST CT ABDOMEN AND PELVIS WITH CONTRAST TECHNIQUE: Multidetector CT imaging of the chest was performed using the standard protocol during bolus administration of intravenous contrast. Multiplanar CT image reconstructions and MIPs were obtained to evaluate the vascular anatomy. Multidetector CT imaging of the abdomen and pelvis was performed using the standard protocol during bolus administration of intravenous contrast. CONTRAST:  189mL OMNIPAQUE IOHEXOL 350 MG/ML SOLN COMPARISON:  Abdominal ultrasound dated 05/05/2020. FINDINGS: CTA CHEST FINDINGS Cardiovascular: There is no cardiomegaly or pericardial effusion. The thoracic  aorta is unremarkable. The origins of the great vessels of the aortic arch appear patent. Faint linear hyperdensity along the left lateral wall of the pulmonary trunk (56/6) likely artifactual. No pulmonary artery embolus identified. Mediastinum/Nodes: There is no hilar or mediastinal adenopathy. The esophagus is grossly unremarkable. No mediastinal fluid collection. Lungs/Pleura: Bibasilar linear atelectasis/scarring. No focal consolidation, pleural effusion, or pneumothorax. The central airways are patent. Musculoskeletal: No chest wall abnormality. No acute or significant osseous findings. Review of the MIP images confirms the above findings. CT ABDOMEN and PELVIS FINDINGS No intra-abdominal free air.  Small ascites. Hepatobiliary: Severe fatty liver. The liver is enlarged measuring 19 cm in midclavicular length. Correlation with clinical exam and LFTs recommended to evaluate for steatohepatitis. No intrahepatic biliary ductal dilatation. The gallbladder is predominantly contracted. No calcified gallstone. Pancreas: Unremarkable. No pancreatic ductal dilatation or surrounding inflammatory changes. Spleen: Normal in size without focal abnormality. Adrenals/Urinary Tract: The adrenal glands are unremarkable. There is no hydronephrosis on either side. The visualized ureters appear unremarkable. The urinary bladder is minimally distended and grossly unremarkable. Stomach/Bowel: There is no bowel obstruction or active inflammation. The appendix is normal. Vascular/Lymphatic: The abdominal aorta and IVC are unremarkable. No portal venous gas. There is no adenopathy. Reproductive: The uterus is anteverted and grossly unremarkable. No adnexal masses. Other: Mild diffuse subcutaneous edema. Musculoskeletal: No acute or significant osseous findings. Review of the MIP images confirms the above findings. IMPRESSION: 1. No acute intrathoracic pathology. No CT evidence of pulmonary embolism. 2. Severe fatty liver with findings  of possible steatohepatitis. Clinical correlation is recommended. 3. Small ascites. 4. No bowel obstruction. Normal appendix. Electronically Signed   By: Laren Everts.D.  On: 06/02/2020 21:26   CT ABDOMEN PELVIS W CONTRAST  Result Date: 06/02/2020 CLINICAL DATA:  20 year old female with abdominal pain and concern for pulmonary embolism. EXAM: CT ANGIOGRAPHY CHEST CT ABDOMEN AND PELVIS WITH CONTRAST TECHNIQUE: Multidetector CT imaging of the chest was performed using the standard protocol during bolus administration of intravenous contrast. Multiplanar CT image reconstructions and MIPs were obtained to evaluate the vascular anatomy. Multidetector CT imaging of the abdomen and pelvis was performed using the standard protocol during bolus administration of intravenous contrast. CONTRAST:  147mL OMNIPAQUE IOHEXOL 350 MG/ML SOLN COMPARISON:  Abdominal ultrasound dated 05/05/2020. FINDINGS: CTA CHEST FINDINGS Cardiovascular: There is no cardiomegaly or pericardial effusion. The thoracic aorta is unremarkable. The origins of the great vessels of the aortic arch appear patent. Faint linear hyperdensity along the left lateral wall of the pulmonary trunk (56/6) likely artifactual. No pulmonary artery embolus identified. Mediastinum/Nodes: There is no hilar or mediastinal adenopathy. The esophagus is grossly unremarkable. No mediastinal fluid collection. Lungs/Pleura: Bibasilar linear atelectasis/scarring. No focal consolidation, pleural effusion, or pneumothorax. The central airways are patent. Musculoskeletal: No chest wall abnormality. No acute or significant osseous findings. Review of the MIP images confirms the above findings. CT ABDOMEN and PELVIS FINDINGS No intra-abdominal free air.  Small ascites. Hepatobiliary: Severe fatty liver. The liver is enlarged measuring 19 cm in midclavicular length. Correlation with clinical exam and LFTs recommended to evaluate for steatohepatitis. No intrahepatic biliary  ductal dilatation. The gallbladder is predominantly contracted. No calcified gallstone. Pancreas: Unremarkable. No pancreatic ductal dilatation or surrounding inflammatory changes. Spleen: Normal in size without focal abnormality. Adrenals/Urinary Tract: The adrenal glands are unremarkable. There is no hydronephrosis on either side. The visualized ureters appear unremarkable. The urinary bladder is minimally distended and grossly unremarkable. Stomach/Bowel: There is no bowel obstruction or active inflammation. The appendix is normal. Vascular/Lymphatic: The abdominal aorta and IVC are unremarkable. No portal venous gas. There is no adenopathy. Reproductive: The uterus is anteverted and grossly unremarkable. No adnexal masses. Other: Mild diffuse subcutaneous edema. Musculoskeletal: No acute or significant osseous findings. Review of the MIP images confirms the above findings. IMPRESSION: 1. No acute intrathoracic pathology. No CT evidence of pulmonary embolism. 2. Severe fatty liver with findings of possible steatohepatitis. Clinical correlation is recommended. 3. Small ascites. 4. No bowel obstruction. Normal appendix. Electronically Signed   By: Anner Crete M.D.   On: 06/02/2020 21:26   VAS [place] LOWER EXTREMITY VENOUS (DVT)  Result Date: 06/04/2020  Lower Venous DVT Study Indications: Edema.  Comparison Study: no prior Performing Technologist: Abram Sander RVS  Examination Guidelines: A complete evaluation includes B-mode imaging, spectral Doppler, color Doppler, and power Doppler as needed of all accessible portions of each vessel. Bilateral testing is considered an integral part of a complete examination. Limited examinations for reoccurring indications may be performed as noted. The reflux portion of the exam is performed with the patient in reverse Trendelenburg.  +---------+---------------+---------+-----------+----------+--------------+ RIGHT     CompressibilityPhasicitySpontaneityPropertiesThrombus Aging +---------+---------------+---------+-----------+----------+--------------+ CFV      Full           Yes      Yes                                 +---------+---------------+---------+-----------+----------+--------------+ SFJ      Full                                                        +---------+---------------+---------+-----------+----------+--------------+  FV Prox  Full                                                        +---------+---------------+---------+-----------+----------+--------------+ FV Mid   Full                                                        +---------+---------------+---------+-----------+----------+--------------+ FV DistalFull                                                        +---------+---------------+---------+-----------+----------+--------------+ PFV      Full                                                        +---------+---------------+---------+-----------+----------+--------------+ POP      Full           Yes      Yes                                 +---------+---------------+---------+-----------+----------+--------------+ PTV      Full                                                        +---------+---------------+---------+-----------+----------+--------------+ PERO     Full                                                        +---------+---------------+---------+-----------+----------+--------------+   +---------+---------------+---------+-----------+----------+--------------+ LEFT     CompressibilityPhasicitySpontaneityPropertiesThrombus Aging +---------+---------------+---------+-----------+----------+--------------+ CFV      Full           Yes      Yes                                 +---------+---------------+---------+-----------+----------+--------------+ SFJ      Full                                                         +---------+---------------+---------+-----------+----------+--------------+ FV Prox  Full                                                        +---------+---------------+---------+-----------+----------+--------------+  FV Mid   Full                                                        +---------+---------------+---------+-----------+----------+--------------+ FV DistalFull                                                        +---------+---------------+---------+-----------+----------+--------------+ PFV      Full                                                        +---------+---------------+---------+-----------+----------+--------------+ POP      Full           Yes      Yes                                 +---------+---------------+---------+-----------+----------+--------------+ PTV      Full                                                        +---------+---------------+---------+-----------+----------+--------------+ PERO     Full                                                        +---------+---------------+---------+-----------+----------+--------------+     Summary: BILATERAL: - No evidence of deep vein thrombosis seen in the lower extremities, bilaterally. - No evidence of superficial venous thrombosis in the lower extremities, bilaterally. -No evidence of popliteal cyst, bilaterally.   *See table(s) above for measurements and observations. Electronically signed by Deitra Mayo MD on 06/04/2020 at 5:15:45 AM.    Final    US Abdomen Limited RUQ (LIVER/GB)  Result Date: 06/03/2020 CLINICAL DATA:  Ischemic hepatitis. EXAM: ULTRASOUND ABDOMEN LIMITED RIGHT UPPER QUADRANT COMPARISON:  CT abdomen pelvis 05/05/2020 FINDINGS: Gallbladder: The gallbladder appears to be contracted. No gallstones or wall thickening visualized. No sonographic Murphy sign noted by sonographer. Common bile duct: Diameter: 82mm Liver: Limited  evaluation due to positioning. No focal lesion identified. Increased parenchymal echogenicity. Portal vein is patent on color Doppler imaging with normal direction of blood flow towards the liver. Other: None. IMPRESSION: Hepatic steatosis. Please note limited evaluation for focal hepatic masses in a patient with hepatic steatosis due to decreased penetration of the acoustic ultrasound waves. Also limited evaluation due to positioning. Recommend MRI liver protocol. Electronically Signed   By: Iven Finn M.D.   On: 06/03/2020 22:51    Assessment/Plan: Patient presented to the emergency department on the evening of 06/02/2020 with vague GI symptoms. She has a history of RA and prior transaminitis believed to be related to Rinvoq, which was  discontinued. Her lab work in the ED demonstrated pancytopenia. She fell while in the ED, striking her head. CT head showed a small amount of subarachnoid hemorrhage along the convexities bilaterally. She remained neuro intact. Due to patient's coagulopathy, her head was rescanned. The second CT head showed worsening of the hemorrhage. Patient continues to remain neuro intact. Unfortunately, patient is not a candidate for surgical removal of the hematoma if her neurologic symptoms worsen given her continued risk for bleeding.   LOS: 1 day    -Follow up scan ordered for this afternoon. -Transfusions and medical management per Critical Care Medicine and hematology/oncology.  Viona Gilmore, DNP, AGNP-C Nurse Practitioner  [facility] [place] [address], [place], [place] [address] P: [phone]    F: [phone]  06/04/2020, 10:35 AM

## 2020-06-04 NOTE — Progress Notes (Addendum)
eLink Physician-Brief Progress Note Patient Name: Jessica Frazier DOB: Apr 09, 2000 MRN: [id number]   Date of Service  06/04/2020  HPI/Events of Note  Notified of patient having 6/10 headache Only has fentanyl ordered Liver enzymes almost normalized Platelets 61, fibrinogen 164, INR 1.3  eICU Interventions  Ordered a one time dose of Tylenol. Not to exceed 2 g in 24 hours     Intervention Category Intermediate Interventions: Pain - evaluation and management  Judd Lien 06/04/2020, 12:47 AM

## 2020-06-04 NOTE — Progress Notes (Signed)
NAME:  Jessica Frazier, MRN:  [id number], DOB:  [birth year], LOS: 1 ADMISSION DATE:  06/02/2020, CONSULTATION DATE:  3/22 REFERRING MD:  Charlyne Quale ED-PA, CHIEF COMPLAINT:  ICH  History of Present Illness:  20 year old female with PMH as below, which is significant for RA and hypothyroid. She was recently admitted in January of this year after having LFT elevation on routine labs. This was felt to be a result of Rinvoq, which she was taking for RA. The following is from the discharge summary.   Sent over from Rheumatology office given elevated Transaminases with AST over 1000.  On Admission AST was 1069 and ALT was 284.  Alk Phos also slightly elevated at 185.  Also had mild upper right quadrant pain with negative Murphy's sign.  Patient got significant lab work-up.  Hepatitis Panel , hCG, HIV, Monospot, Ethanol and Acetaminophen were all negative.  PT and aPTT were also normal.  RUQ was also obtained and showed liver inflammation, and some biliary sludge but no stone, blockage or cholecystitis.  Believe elevations were due to Rinvoq medication which patient had been taking for Rheumatoid Arthritis.  Stopped medication and did not restart at discharge.  LFT's slightly decreased at time of discharge and felt patient could be safely monitored outpatient.  She presented again to [facility] ED 3/21 with complaints of abdominal pain and dark stools for approximately 4 days. Pain is intermittent and localized to LUQ and RLQ. She did have one episode of emesis on 3/20, but is was not bloody in appearance. Stools were only dark, no bright red blood noted. As her symptoms were not improving, she presented to the ED 3/21. While using the restroom in the ED, she lost balance and fell, striking her head. CT showed multifocal acute SAH. Labs showed thrombocytopenia and platelets 43. Neurosurgery consulted. Patient was unable to complete FFP due to fever during transfusion. Repeat CT showed expanding SAH as well as new  SDH and ICH. PCCM asked to admit and hematology consulted.   Pertinent  Medical History   has a past medical history of Bell's palsy, Hypothyroidism, and Rheumatoid arthritis ([place]).  Significant Hospital Events: Including procedures, antibiotic start and stop dates in addition to other pertinent events   . 3/22 admitted to ICU with traumatic ICH, SDH, SAH. . 3/22 CT abdomen/pelvis > severe fatty liver with findings of possible steatohepatitis. Small ascites.  . CT head > Multifocal new ICH, Expanding L scalp hematoma, new L SDH. Thre cerebral hemorrhagic contusions. Mildly worse SAH. Trace rightward midline shift.    Interim History / Subjective:  No events.  Headache persists. Coagulopathy and thrombocytopenia responded appropriately to transfusion.  Objective   Blood pressure 108/74, pulse (!) 103, temperature 98.7 F (37.1 C), temperature source Oral, resp. rate (!) 21, height _0  (1.575 m), weight 53.6 kg, SpO2 100 %.        Intake/Output Summary (Last 24 hours) at 06/04/2020 0843 Last data filed at 06/04/2020 0400 Gross per 24 hour  Intake 1237.45 ml  Output 800 ml  Net 437.45 ml   Filed Weights   06/03/20 1400 06/03/20 1457  Weight: 53.6 kg 53.6 kg    Examination: Constitutional: young woman in NAD  Eyes: EOMI, pupils equal Ears, nose, mouth, and throat: MMM, trachea midline Cardiovascular: tachycardic, ext warm Respiratory: clear, no wheezing Gastrointestinal: soft, mild TTP in epigastrum persists Skin: No rashes, normal turgor; examined scalp: R posterior contusion without overlying skin breakdown Neurologic: moves all 4 ext briskly to  command Psychiatric: RASS 0   Labs/imaging that I have personally reviewed   LE duplex neg CRP/ESR relatively benign LDH up LFTs stable  Resolved Hospital Problem list    Assessment & Plan:   Traumatic brain injury: multiple injuries following fall from standing. SDH, SAH, ICH, right frontal contusion. All in the setting  of coagulopathy and pancytopenia.   Fortunately stable exam and appropriate response to transfusion. - Neuro checks - Okay to eat - May need another scan today to document stability, defer to NSGY  Coagulopathy, macrocytic anemia, thrombocytopenia, leukopenia- differential complicated by poor PO, recent acute liver injury, recent upadacitinib and poorly defined polyarthritis over past 2 years.  APL with complex coagulopathy has been suggested as a possible cause. - Bone marrow biopsy today, appreciate Mendel Ryder Causey's help - f/u peripheral smear - Continue ATRA, prednisone for now pending smear, if concerning for APL needs transfer - q8h fibrinogen/INR/CBC for now; goal fibrinogen 200 per heme  Fatty liver, chronic N/V/D-  Unclear cause, acute hepatitis last month was attributed to upadacitinib.  LFTs okay for now but does have AST/ALT.  She denies Etoh use.  Lipase normal.  Darker stools recently are due to her coagulopathy, would not chase this at present. - Watch clinically - PPI is fine  Questionable seronegative RA manifested by polyarthritis and LE swelling?  ENA neg as OP, repeat pending. - Supportive care - Outpatient rheum records reviewed  Hypothyroid: TSH mildly elevated - continue PTA synthroid  Protein calorie malnutrition Rule out nephrotic syndrome Rule out anorexia - 24h urine protein - will need dietary evaluation at some point.  Best practice (evaluated daily)  Diet:  Oral Pain/Anxiety/Delirium protocol (if indicated): No VAP protocol (if indicated): Not indicated DVT prophylaxis: Contraindicated GI prophylaxis: PPI Glucose control:  SSI No Central venous access:  N/A Arterial line:  N/A Foley:  N/A Mobility:  bed rest  PT consulted: N/A Last date of multidisciplinary goals of care discussion [FULL CODE] Code Status:  full code Disposition: ICU   Patient critically ill due to intracranial bleed, coagulopathy, pancytopenia Interventions to address this  today bone marrow biopsy, neuro checks, ATRA Risk of deterioration without these interventions is high  I personally spent 85 minutes providing critical care not including any separately billable procedures  Erskine Emery MD [facility]  Prefer epic messenger for cross cover needs If after hours, please call E-link

## 2020-06-04 NOTE — Progress Notes (Incomplete)
Pt still very sleepy from procedural meds earlier this morning.  Not opening eyes to commands. Pt is localizing to pain. Follow up CT scan is scheduled.

## 2020-06-04 NOTE — Progress Notes (Signed)
Hematology Short note  Discussed prelim BM H&E with Dr Monica Martinez-- no evidence of increased blasts-- unlikely to be APL. Will hold ATRA. May use non irradiated blood products in this light. Final BM Bx results likely will be available tomorrow.  Coagulopathy likely from liver disease vs DIC . Unclear trigger for DIC ?autoimmune vs acute liver injury  Urine tox ordered  Jessica Frazier

## 2020-06-04 NOTE — Progress Notes (Signed)
Pt seen and examined, rpt Clare reviewed. She is occasionally opening eyes to voice, but tries to close lids when you forcefully open her eyes, no speech output, but she does FCx4 without preference, PERRL, gaze neutral. CTH with continued expansion of hemorrhages, especially right temporal hematoma and now with some midline shift. There are fluid-fluid levels in the hemorrhages consistent with unclotted blood.   -unfortunately, pt is clearly still severely coagulopathic. Surgery will do more harm than good. Even if we could temporarily get hemostasis, post-operatively she will certainly have further hemorrhage, especially given how bad these hemorrhages have been from a low energy mechanism. I discussed this with the mom at bedside.

## 2020-06-05 ENCOUNTER — Inpatient Hospital Stay (HOSPITAL_COMMUNITY): Payer: 59

## 2020-06-05 DIAGNOSIS — D649 Anemia, unspecified: Secondary | ICD-10-CM

## 2020-06-05 DIAGNOSIS — D696 Thrombocytopenia, unspecified: Secondary | ICD-10-CM

## 2020-06-05 DIAGNOSIS — Z008 Encounter for other general examination: Secondary | ICD-10-CM

## 2020-06-05 DIAGNOSIS — D689 Coagulation defect, unspecified: Secondary | ICD-10-CM | POA: Diagnosis not present

## 2020-06-05 DIAGNOSIS — I609 Nontraumatic subarachnoid hemorrhage, unspecified: Secondary | ICD-10-CM | POA: Diagnosis not present

## 2020-06-05 LAB — BPAM CRYOPRECIPITATE
Blood Product Expiration Date: 202203231600
Blood Product Expiration Date: 202203240000
ISSUE DATE / TIME: 202203231047
ISSUE DATE / TIME: 202203231950
Unit Type and Rh: 5100
Unit Type and Rh: 5100

## 2020-06-05 LAB — GLUCOSE, CAPILLARY
Glucose-Capillary: 128 mg/dL — ABNORMAL HIGH (ref 70–99)
Glucose-Capillary: 133 mg/dL — ABNORMAL HIGH (ref 70–99)
Glucose-Capillary: 143 mg/dL — ABNORMAL HIGH (ref 70–99)
Glucose-Capillary: 119 mg/dL — ABNORMAL HIGH (ref 70–99)
Glucose-Capillary: 114 mg/dL — ABNORMAL HIGH (ref 70–99)
Glucose-Capillary: 99 mg/dL (ref 70–99)

## 2020-06-05 LAB — PREPARE FRESH FROZEN PLASMA

## 2020-06-05 LAB — CBC
HCT: 24.4 % — ABNORMAL LOW (ref 36.0–46.0)
HCT: 25.5 % — ABNORMAL LOW (ref 36.0–46.0)
Hemoglobin: 8 g/dL — ABNORMAL LOW (ref 12.0–15.0)
Hemoglobin: 8.6 g/dL — ABNORMAL LOW (ref 12.0–15.0)
MCH: 30.3 pg (ref 26.0–34.0)
MCH: 30.7 pg (ref 26.0–34.0)
MCHC: 32.8 g/dL (ref 30.0–36.0)
MCHC: 33.7 g/dL (ref 30.0–36.0)
MCV: 92.4 fL (ref 80.0–100.0)
MCV: 91.1 fL (ref 80.0–100.0)
Platelets: 150 10*3/uL (ref 150–400)
Platelets: 160 10*3/uL (ref 150–400)
RBC: 2.64 MIL/uL — ABNORMAL LOW (ref 3.87–5.11)
RBC: 2.8 MIL/uL — ABNORMAL LOW (ref 3.87–5.11)
RDW: 18.8 % — ABNORMAL HIGH (ref 11.5–15.5)
RDW: 18.9 % — ABNORMAL HIGH (ref 11.5–15.5)
WBC: 3.1 10*3/uL — ABNORMAL LOW (ref 4.0–10.5)
WBC: 3.6 10*3/uL — ABNORMAL LOW (ref 4.0–10.5)
nRBC: 0 % (ref 0.0–0.2)
nRBC: 0 % (ref 0.0–0.2)

## 2020-06-05 LAB — RAPID URINE DRUG SCREEN, HOSP PERFORMED
Amphetamines: NOT DETECTED
Barbiturates: NOT DETECTED
Benzodiazepines: POSITIVE — AB
Cocaine: NOT DETECTED
Opiates: NOT DETECTED
Tetrahydrocannabinol: NOT DETECTED

## 2020-06-05 LAB — COMPREHENSIVE METABOLIC PANEL
ALT: 28 U/L (ref 0–44)
AST: 66 U/L — ABNORMAL HIGH (ref 15–41)
Albumin: 2.1 g/dL — ABNORMAL LOW (ref 3.5–5.0)
Alkaline Phosphatase: 127 U/L — ABNORMAL HIGH (ref 38–126)
Anion gap: 7 (ref 5–15)
BUN: 7 mg/dL (ref 6–20)
CO2: 23 mmol/L (ref 22–32)
Calcium: 7.9 mg/dL — ABNORMAL LOW (ref 8.9–10.3)
Chloride: 105 mmol/L (ref 98–111)
Creatinine, Ser: 0.41 mg/dL — ABNORMAL LOW (ref 0.44–1.00)
GFR, Estimated: >60 mL/min (ref >60)
Glucose, Bld: 132 mg/dL — ABNORMAL HIGH (ref 70–99)
Potassium: 4.2 mmol/L (ref 3.5–5.1)
Sodium: 135 mmol/L (ref 135–145)
Total Bilirubin: 1.2 mg/dL (ref 0.3–1.2)
Total Protein: 6.5 g/dL (ref 6.5–8.1)

## 2020-06-05 LAB — BASIC METABOLIC PANEL
Anion gap: 6 (ref 5–15)
Anion gap: 8 (ref 5–15)
BUN: 7 mg/dL (ref 6–20)
BUN: 6 mg/dL (ref 6–20)
CO2: 25 mmol/L (ref 22–32)
CO2: 25 mmol/L (ref 22–32)
Calcium: 7.9 mg/dL — ABNORMAL LOW (ref 8.9–10.3)
Calcium: 8 mg/dL — ABNORMAL LOW (ref 8.9–10.3)
Chloride: 105 mmol/L (ref 98–111)
Chloride: 103 mmol/L (ref 98–111)
Creatinine, Ser: 0.45 mg/dL (ref 0.44–1.00)
Creatinine, Ser: 0.42 mg/dL — ABNORMAL LOW (ref 0.44–1.00)
GFR, Estimated: >60 mL/min (ref >60)
GFR, Estimated: >60 mL/min (ref >60)
Glucose, Bld: 114 mg/dL — ABNORMAL HIGH (ref 70–99)
Glucose, Bld: 136 mg/dL — ABNORMAL HIGH (ref 70–99)
Potassium: 4.1 mmol/L (ref 3.5–5.1)
Potassium: 4.2 mmol/L (ref 3.5–5.1)
Sodium: 136 mmol/L (ref 135–145)
Sodium: 136 mmol/L (ref 135–145)

## 2020-06-05 LAB — PROTEIN, URINE, 24 HOUR
Collection Interval-UPROT: 24 hours
Protein, 24H Urine: 279 mg/d — ABNORMAL HIGH (ref 50–100)
Protein, Urine: 93 mg/dL
Urine Total Volume-UPROT: 300 mL

## 2020-06-05 LAB — APTT
aPTT: 37 seconds — ABNORMAL HIGH (ref 24–36)
aPTT: 36 seconds (ref 24–36)

## 2020-06-05 LAB — BPAM PLATELET PHERESIS
Blood Product Expiration Date: 202203232359
Blood Product Expiration Date: 202203232359
ISSUE DATE / TIME: 202203231645
ISSUE DATE / TIME: 202203231820
Unit Type and Rh: 5100
Unit Type and Rh: 5100

## 2020-06-05 LAB — PT FACTOR INHIBITOR (MIXING STUDY)
1 HR INCUB PT 1:1NP: 11.3 s (ref 9.1–12.0)
PT 1:1NP: 11.2 s (ref 9.1–12.0)
PT: 23.5 s — ABNORMAL HIGH (ref 9.1–12.0)

## 2020-06-05 LAB — PREPARE CRYOPRECIPITATE
Unit division: 0
Unit division: 0

## 2020-06-05 LAB — PREPARE PLATELET PHERESIS
Unit division: 0
Unit division: 0

## 2020-06-05 LAB — BPAM FFP
Blood Product Expiration Date: 202203282359
ISSUE DATE / TIME: 202203231950
Unit Type and Rh: 6200

## 2020-06-05 LAB — BPAM RBC
Blood Product Expiration Date: 202204242359
ISSUE DATE / TIME: 202203231433
Unit Type and Rh: 5100

## 2020-06-05 LAB — PHOSPHORUS: Phosphorus: 4.1 mg/dL (ref 2.5–4.6)

## 2020-06-05 LAB — PROTIME-INR
INR: 1 (ref 0.8–1.2)
INR: 1.1 (ref 0.8–1.2)
Prothrombin Time: 13.2 seconds (ref 11.4–15.2)
Prothrombin Time: 14 seconds (ref 11.4–15.2)

## 2020-06-05 LAB — MAGNESIUM: Magnesium: 2.1 mg/dL (ref 1.7–2.4)

## 2020-06-05 LAB — TYPE AND SCREEN
ABO/RH(D): O POS
Antibody Screen: NEGATIVE
Unit division: 0

## 2020-06-05 LAB — PTT FACTOR INHIBITOR (MIXING STUDY)
aPTT 1:1 NP Incub. Mix Ctl: 35.2 s — ABNORMAL HIGH (ref 22.9–30.2)
aPTT 1:1 NP Incub. Mix Ctl: 32.7 s — ABNORMAL HIGH (ref 22.9–30.2)
aPTT 1:1 NP Mix, 60 Min,Incub.: 36.7 s — ABNORMAL HIGH (ref 22.9–30.2)
aPTT 1:1 NP Mix, 60 Min,Incub.: 33.8 s — ABNORMAL HIGH (ref 22.9–30.2)
aPTT 1:1 Normal Plasma: 33.8 s — ABNORMAL HIGH (ref 22.9–30.2)
aPTT 1:1 Normal Plasma: 31.4 s — ABNORMAL HIGH (ref 22.9–30.2)
aPTT: >121.9 s — ABNORMAL HIGH (ref 22.9–30.2)
aPTT: 63.5 s — ABNORMAL HIGH (ref 22.9–30.2)

## 2020-06-05 LAB — FIBRINOGEN
Fibrinogen: 243 mg/dL (ref 210–475)
Fibrinogen: 233 mg/dL (ref 210–475)

## 2020-06-05 LAB — SURGICAL PATHOLOGY

## 2020-06-05 MED ORDER — PREDNISONE 20 MG PO TABS
40.0000 mg | ORAL_TABLET | Freq: Every day | ORAL | Status: DC
  Administered 2020-06-06 – 2020-06-16 (×11): 40 mg via ORAL
  Filled 2020-06-05 (×12): qty 2

## 2020-06-05 NOTE — Progress Notes (Signed)
NAME:  Jessica Frazier, MRN:  [id number], DOB:  [birth year], LOS: 2 ADMISSION DATE:  06/02/2020, CONSULTATION DATE:  3/22 REFERRING MD:  Charlyne Quale ED-PA, CHIEF COMPLAINT:  ICH  History of Present Illness:  20 year old female with PMH as below, which is significant for sero negative RA and hypothyroid. She was recently admitted in January of this year after having LFT elevation on routine labs. This was felt to be a result of Rinvoq, which she was taking for RA. The following is from the discharge summary.   Sent over from Rheumatology office given elevated Transaminases with AST over 1000.  On Admission AST was 1069 and ALT was 284.  Alk Phos also slightly elevated at 185.  Also had mild upper right quadrant pain with negative Murphy's sign.  Patient got significant lab work-up.  Hepatitis Panel , hCG, HIV, Monospot, Ethanol and Acetaminophen were all negative.  PT and aPTT were also normal.  RUQ was also obtained and showed liver inflammation, and some biliary sludge but no stone, blockage or cholecystitis.  Believe elevations were due to Rinvoq medication which patient had been taking for Rheumatoid Arthritis.  Stopped medication and did not restart at discharge.  LFT's slightly decreased at time of discharge and felt patient could be safely monitored outpatient.  She presented again to [facility] ED 3/21 with complaints of abdominal pain and dark stools for approximately 4 days. Pain is intermittent and localized to LUQ and RLQ. She did have one episode of emesis on 3/20, but is was not bloody in appearance. Stools were only dark, no bright red blood noted. As her symptoms were not improving, she presented to the ED 3/21. While using the restroom in the ED, she lost balance and fell, striking her head. CT showed multifocal acute SAH. Labs showed thrombocytopenia and platelets 43. Neurosurgery consulted. Patient was unable to complete FFP due to fever during transfusion. Repeat CT showed expanding SAH as  well as new SDH and ICH. PCCM asked to admit and hematology consulted.   Pertinent  Medical History   has a past medical history of Bell's palsy, Hypothyroidism, and Rheumatoid arthritis ([employer]).  Significant Hospital Events: Including procedures, antibiotic start and stop dates in addition to other pertinent events   . 3/22 admitted to ICU with traumatic ICH, SDH, SAH. . 3/22 CT abdomen/pelvis > severe fatty liver with findings of possible steatohepatitis. Small ascites.  . CT head > Multifocal new ICH, Expanding L scalp hematoma, new L SDH. Thre cerebral hemorrhagic contusions. Mildly worse SAH. Trace rightward midline shift.  . 3/23 BM bx, continued prednisone, transfused cryo and PRBC  Interim History / Subjective:  No changes overnight  CTH overnight with stable ICHs, localized mass effect and edema mildly increased, no hydrocephalus or trapping  RN reports patient communicates when she wants Patient denies any complaints this morning, denied pain until ROM exercises- complains of bilateral knee pain; no appetite   Hgb stable LFTs stable Pending BM bx from 3/23 and further immunological workup  Objective   Blood pressure (!) 141/95, pulse 60, temperature 97.6 F (36.4 C), temperature source Axillary, resp. rate (!) 25, height 5' 2"  (1.575 m), weight 51.7 kg, SpO2 93 %.        Intake/Output Summary (Last 24 hours) at 06/05/2020 0827 Last data filed at 06/05/2020 0600 Gross per 24 hour  Intake 955.84 ml  Output 450 ml  Net 505.84 ml   Filed Weights   06/03/20 1400 06/03/20 1457 06/05/20 0500  Weight:  53.6 kg 53.6 kg 51.7 kg    Examination: General:  Chronically ill/ thin appearing female HEENT: MM pink/moist, pupils 3/sluggish, left posterior scalp hematoma Neuro: flat affect, lethargic, slow to respond, will f/c, MAE- generalized weakness CV: rr, NSR, no murmur PULM:  Non labored, shallow, clear, diminished in bases GI: soft, bs+, NT, purwick Extremities: warm/dry,  +1 LE edema  Skin: no rashes    Labs/imaging that I have personally reviewed   LE duplex neg 3/22 CRP 4.5 /ESR 30-> 98 LDH 397- > 341 LFTs stable DIC panel neg BM bx 3/23  pending  [facility] 3/24   Resolved Hospital Problem list    Assessment & Plan:   Traumatic brain injury: multiple injuries following fall from standing. SDH, SAH, ICH, right frontal contusion. All in the setting of coagulopathy and pancytopenia.   Fortunately stable exam and appropriate response to transfusion. - Neuro checks stable, continue to monitor closely  - CTH overnight w/ stable ICHs, localized mass effect and edema mildly increased, no hydrocephalus or trapping - EEG 3/23 c/w severe diffuse encephalopathy  - NSGY following - improving coagulapathy   Coagulopathy, macrocytic anemia, thrombocytopenia, leukopenia- differential complicated by poor PO (?starvation), recent acute liver injury, recent upadacitinib and poorly defined polyarthritis over past 2 years.  APL with complex coagulopathy has been suggested as a possible cause - Bone marrow biopsy 3/23 pending - appreciate oncology assistance, no evidence of increased blast, unlikely to APL - DIC negative, ? Coagulapathy secondary to liver disease - UDS pending (send out) - uric acid normal  - f/u peripheral smear - ATRA stopped 3/23 - vit k stopped 3/24, stable  - q8h fibrinogen/INR/CBC; goal fibrinogen 200 per heme- stable thus far today  Fatty liver, chronic N/V/D-  Unclear cause, acute hepatitis last month was attributed to upadacitinib.  LFTs okay for now but does have elevated AST/ALT.  She denies Etoh use.  Lipase normal.  Darker stools recently are due to her coagulopathy, would not chase this at present. - GI signed off 3/23, question abnormal LFTs, severe fatty liver/ hepatomegaly from ?starvation - DIC panel neg - LFTs stable, trend   Questionable seronegative RA manifested by polyarthritis and LE swelling?  ENA neg as OP - further rhem  workup pending- cardiolipins, factors, lupus anticoagulant panel, flow cytometry  - ESR/ CRP elevated - continue prednisone 40 mg daily for now, ? Vasculitis, ongoing inflammation till labs sorted out  - Supportive care - Outpatient rheum records reviewed (in paper chart)  Hypothyroid: TSH mildly elevated - continue PTA synthroid  Protein calorie malnutrition Rule out nephrotic syndrome Rule out anorexia - 24h urine protein underway - dietary consult with calorie count- may need cortrak if not sufficient  - psychiatry consulted 3/24 to rule out underlying component, ?depression/ anorexia   Best practice (evaluated daily)  Diet:  Oral Pain/Anxiety/Delirium protocol (if indicated): No VAP protocol (if indicated): Not indicated DVT prophylaxis: Contraindicated GI prophylaxis: PPI Glucose control:  SSI No Central venous access:  N/A Arterial line:  N/A Foley:  N/A Mobility:  bed rest  PT consulted: N/A Last date of multidisciplinary goals of care discussion [FULL CODE] Code Status:  full code Disposition: ICU   CRITICAL CARE Performed by: Kennieth Rad   Total critical care time: 50 minutes  Critical care time was exclusive of separately billable procedures and treating other patients.  Critical care was necessary to treat or prevent imminent or life-threatening deterioration.  Critical care was time spent personally by me on the following  activities: development of treatment plan with patient and/or surrogate as well as nursing, discussions with consultants, evaluation of patient's response to treatment, examination of patient, obtaining history from patient or surrogate, ordering and performing treatments and interventions, ordering and review of laboratory studies, ordering and review of radiographic studies, pulse oximetry and re-evaluation of patient's condition.   Kennieth Rad, ACNP [facility] 06/05/2020, 8:27 AM  See Amion for pager If no  response to pager, please call [phone] until 7pm After 7:00 pm call Elink  [phone]

## 2020-06-05 NOTE — Progress Notes (Addendum)
Providing Compassionate, Quality Care - Together   Subjective: Patient's mother and brother are at the bedside. They feel the patient is closer to her baseline mental status today.  Objective: Vital signs in last 24 hours: Temp:  [97.6 F (36.4 C)-99.5 F (37.5 C)] 97.6 F (36.4 C) (03/24 0800) Pulse Rate:  [59-120] 65 (03/24 1000) Resp:  [12-29] 26 (03/24 1000) BP: (106-143)/(58-102) 138/102 (03/24 1000) SpO2:  [92 %-100 %] 100 % (03/24 1000) Weight:  [51.7 kg] 51.7 kg (03/24 0500)  Intake/Output from previous day: 03/23 0701 - 03/24 0700 In: 955.8 [I.V.:50.8; Blood:795; IV Piggyback:110] Out: 750 [Urine:750] Intake/Output this shift: No intake/output data recorded.  Responds to voice, oriented to person and place PERRLA, 4 brisk Follows commands MAE, generalized weakness; exam somewhat limited due to joint pain BLE 2+ pitting edema   Lab Results: Recent Labs    06/04/20 2142 06/05/20 0415  WBC 4.4  4.4 3.1*  HGB 7.8*  7.8* 8.0*  HCT 23.5*  24.1* 24.4*  PLT 152  157 150   BMET Recent Labs    06/05/20 0415 06/05/20 0906  NA 135 136  K 4.2 4.2  CL 105 103  CO2 23 25  GLUCOSE 132* 136*  BUN 7 6  CREATININE 0.41* 0.42*  CALCIUM 7.9* 8.0*    Studies/Results: CT HEAD WO CONTRAST  Result Date: 06/05/2020 CLINICAL DATA:  Follow-up examination for stroke. EXAM: CT HEAD WITHOUT CONTRAST TECHNIQUE: Contiguous axial images were obtained from the base of the skull through the vertex without intravenous contrast. COMPARISON:  Prior CT from 08/04/2020. FINDINGS: Brain: Multiple intracranial hemorrhages again seen. Large hematoma at the right temporal lobe is not significantly changed measuring 2.9 x 2.6 x 6.5 cm, similar to previous when measured in a similar fashion. Localized mass effect and edema about this hemorrhage has mildly increased. Additional hemorrhage positioned at the parasagittal right frontal lobe also little interval changed in size measuring  2.7 x 2.5 cm slightly increased edema about this hemorrhage with localized right-to-left shift. Additional right frontal hemorrhage slightly inferiorly is stable measuring 1.7 cm. Intraparenchymal hemorrhage at the parasagittal left frontal region is little interval changed in size measuring 3.7 x 2.6 cm. Localized edema has mildly increased. Additional scattered small volume subarachnoid hemorrhage little interval changed. Mild right-to-left midline shift is relatively stable. Stable ventricular size and morphology without hydrocephalus. No other new hemorrhage. No acute large vessel territory infarct. Vascular: No hyperdense vessel. Skull: Large evolving left posterior scalp contusion. Calvarium unchanged. Sinuses/Orbits: Globes orbital soft tissues demonstrate no new abnormality. Visualized paranasal sinuses and mastoid air cells remain largely clear. Other: None. IMPRESSION: 1. No significant interval change in size of multiple intracranial hemorrhages as above, although localized mass effect and edema about these hemorrhages has mildly increased. Mild right-to-left midline shift is relatively stable. No hydrocephalus or trapping. 2. No significant interval change in scattered small volume subarachnoid hemorrhage. 3. No other new acute intracranial abnormality. 4. Large evolving left posterior scalp contusion. Electronically Signed   By: Jeannine Boga M.D.   On: 06/05/2020 04:43   CT HEAD WO CONTRAST  Addendum Date: 06/04/2020   ADDENDUM REPORT: 06/04/2020 15:11 ADDENDUM: These results were called by telephone at the time of interpretation on 06/04/2020 at 3:11 pm to provider [facility] , who verbally acknowledged these results. Electronically Signed   By: Franchot Gallo M.D.   On: 06/04/2020 15:11   Result Date: 06/04/2020 CLINICAL DATA:  Head trauma.  Coagulopathy EXAM: CT HEAD WITHOUT CONTRAST TECHNIQUE:  Contiguous axial images were obtained from the base of the skull through the vertex  without intravenous contrast. COMPARISON:  CT head 06/03/2020 FINDINGS: Brain: Multiple foci of cerebral hemorrhage have progressed since yesterday. Large hematoma in the right temporal lobe measuring approximately 6.0 X 2.2 cm. This has a mixture of high density and low-density blood. There is local mass-effect and edema. 17 mm adjacent right frontal hematoma also with progression. Right medial frontal high-density hematoma has enlarged now measuring 25 x 23 mm. Local mass-effect on the frontal horn. Left frontal parietal periventricular hematoma now measures 38 x 25 mm with significant interval enlargement. There is adjacent edema Mild subarachnoid hemorrhage again noted without progression. Ventricle size normal. Mild midline shift to the left. Vascular: Negative for hyperdense vessel Skull: Negative for skull fracture. Sinuses/Orbits: Prior sinus surgery. Mild mucosal edema left maxillary sinus. Negative orbit. Other: Large left parietal scalp hematoma has progressed with further hemorrhage in the scalp. IMPRESSION: 1. Multiple foci of intracranial hemorrhage have progressed since yesterday. Multiple large areas of hemorrhage in the right temporal lobe, right frontal lobe, and left frontal parietal lobe have all increased in size with surrounding edema and local mass-effect. Mild subarachnoid hemorrhage unchanged 2. Large left parietal scalp hematoma has enlarged since yesterday compatible with coagulopathy. Electronically Signed: By: Franchot Gallo M.D. On: 06/04/2020 14:57   EEG adult  Result Date: 06/04/2020 Raenette Rover, MD     06/04/2020  6:20 PM EEG Report Indication: possible seizure activity in setting of SAH This study was recorded in the comatose/not medically induced state.  The duration of the study was 27 minutes.  Electrodes were placed according to the International 10/20 system.  Video was reviewed available for clinical correlation as needed. There was no evidence of organization at any  point, with no discernible anterior - posterior voltage or frequency gradient, posterior dominant rhythm or sleep architecture noted at any point. The background instead is comprised of almost exclusively low - moderate amplitude Delta range slowing in the 1 - 2 Hz range.  There was no clear reactivity/state change and minimal variability. There may have been somewhat lower amplitudes in the left hemisphere as compared to the right, although this was subtle. Hyperventilation: deferred Photic stimulation: deferred There are no clear paroxysmal or epileptiform abnormalities  or interhemispheric asymmetries. Impression: This is an abnormal study due to diffuse, low - moderate attitude Delta slowing with no organization and minimal evidence of reactivity. The background is continuous however. 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 There were slightly lower amplitudes in the left hemisphere  as compared to the right, particularly the posterior region, possibly suggestive of focal neuronal dysfunction, although this was subtle.  There are no clear epileptiform abnormalities.   VAS [place] LOWER EXTREMITY VENOUS (DVT)  Result Date: 06/04/2020  Lower Venous DVT Study Indications: Edema.  Comparison Study: no prior Performing Technologist: Abram Sander RVS  Examination Guidelines: A complete evaluation includes B-mode imaging, spectral Doppler, color Doppler, and power Doppler as needed of all accessible portions of each vessel. Bilateral testing is considered an integral part of a complete examination. Limited examinations for reoccurring indications may be performed as noted. The reflux portion of the exam is performed with the patient in reverse Trendelenburg.  +---------+---------------+---------+-----------+----------+--------------+ RIGHT    CompressibilityPhasicitySpontaneityPropertiesThrombus Aging +---------+---------------+---------+-----------+----------+--------------+  CFV      Full           Yes      Yes                                 +---------+---------------+---------+-----------+----------+--------------+  SFJ      Full                                                        +---------+---------------+---------+-----------+----------+--------------+ FV Prox  Full                                                        +---------+---------------+---------+-----------+----------+--------------+ FV Mid   Full                                                        +---------+---------------+---------+-----------+----------+--------------+ FV DistalFull                                                        +---------+---------------+---------+-----------+----------+--------------+ PFV      Full                                                        +---------+---------------+---------+-----------+----------+--------------+ POP      Full           Yes      Yes                                 +---------+---------------+---------+-----------+----------+--------------+ PTV      Full                                                        +---------+---------------+---------+-----------+----------+--------------+ PERO     Full                                                        +---------+---------------+---------+-----------+----------+--------------+   +---------+---------------+---------+-----------+----------+--------------+ LEFT     CompressibilityPhasicitySpontaneityPropertiesThrombus Aging +---------+---------------+---------+-----------+----------+--------------+ CFV      Full           Yes      Yes                                 +---------+---------------+---------+-----------+----------+--------------+ SFJ      Full                                                        +---------+---------------+---------+-----------+----------+--------------+  FV Prox  Full                                                         +---------+---------------+---------+-----------+----------+--------------+ FV Mid   Full                                                        +---------+---------------+---------+-----------+----------+--------------+ FV DistalFull                                                        +---------+---------------+---------+-----------+----------+--------------+ PFV      Full                                                        +---------+---------------+---------+-----------+----------+--------------+ POP      Full           Yes      Yes                                 +---------+---------------+---------+-----------+----------+--------------+ PTV      Full                                                        +---------+---------------+---------+-----------+----------+--------------+ PERO     Full                                                        +---------+---------------+---------+-----------+----------+--------------+     Summary: BILATERAL: - No evidence of deep vein thrombosis seen in the lower extremities, bilaterally. - No evidence of superficial venous thrombosis in the lower extremities, bilaterally. -No evidence of popliteal cyst, bilaterally.   *See table(s) above for measurements and observations. Electronically signed by Deitra Mayo MD on 06/04/2020 at 5:15:45 AM.    Final    US Abdomen Limited RUQ (LIVER/GB)  Result Date: 06/03/2020 CLINICAL DATA:  Ischemic hepatitis. EXAM: ULTRASOUND ABDOMEN LIMITED RIGHT UPPER QUADRANT COMPARISON:  CT abdomen pelvis 05/05/2020 FINDINGS: Gallbladder: The gallbladder appears to be contracted. No gallstones or wall thickening visualized. No sonographic Murphy sign noted by sonographer. Common bile duct: Diameter: 57mm Liver: Limited evaluation due to positioning. No focal lesion identified. Increased parenchymal echogenicity. Portal vein is patent on color Doppler imaging with  normal direction of blood flow towards the liver. Other: None. IMPRESSION: Hepatic steatosis. Please note limited evaluation for focal hepatic masses in a patient with hepatic steatosis due to decreased penetration of the acoustic ultrasound  waves. Also limited evaluation due to positioning. Recommend MRI liver protocol. Electronically Signed   By: Iven Finn M.D.   On: 06/03/2020 22:51    Assessment/Plan: Patient presented to the emergency department on the evening of 06/02/2020 with vague GI symptoms. She has a history of RA and prior transaminitis believed to be related to Rinvoq, which was discontinued. Her lab work in the ED demonstrated pancytopenia. She fell while in the ED, striking her head. CT head showed a small amount of subarachnoid hemorrhage along the convexities bilaterally. She remained neuro intact. Due to patient's coagulopathy, her head was rescanned. The second CT head showed worsening of the hemorrhage. Patient continues to remain neuro intact. CT performed yesterday afternoon showed continued expansion of hemorrhages. Surgery is still contraindicated. Patient's neurological exam is improved from yesterday, but patient was still somewhat sedated following BM biopsy yesterday morning.   LOS: 2 days    -Transfusions and medical management per Critical Care Medicine and hematology/oncology. -No new Neurosurgical recommendations at this time. Rescan only necessary if neurologic decline.   Viona Gilmore, DNP, AGNP-C Nurse Practitioner  [facility] [place] [address], [place], [place] [address] P: [phone]    F: [phone]  06/05/2020, 11:16 AM

## 2020-06-05 NOTE — Progress Notes (Addendum)
HEMATOLOGY-ONCOLOGY PROGRESS NOTE  SUBJECTIVE: Family reports that patient is more back to baseline today.  Patient reports that she is sleeping a lot but is arousable.  No bleeding reported.  REVIEW OF SYSTEMS:   Constitutional: Denies fevers, chills  Eyes: Denies blurriness of vision Ears, nose, mouth, throat, and face: Denies mucositis or sore throat Respiratory: Denies cough, dyspnea or wheezes Cardiovascular: Denies palpitation, chest discomfort Gastrointestinal: Reports nausea, vomiting recently Skin: Denies abnormal skin rashes Lymphatics: Denies new lymphadenopathy or easy bruising Neurological:Denies numbness, tingling or new weaknesses Behavioral/Psych: Mood is stable, no new changes  Extremities: Reports ongoing lower extremity edema All other systems were reviewed with the patient and are negative.  I have reviewed the past medical history, past surgical history, social history and family history with the patient and they are unchanged from previous note.   PHYSICAL EXAMINATION:  Vitals:   06/05/20 1200 06/05/20 1300  BP: (!) 140/103 (!) 144/99  Pulse:    Resp: (!) 21 (!) 21  Temp:    SpO2:     Filed Weights   06/03/20 1400 06/03/20 1457 06/05/20 0500  Weight: 53.6 kg 53.6 kg 51.7 kg    Intake/Output from previous day: 03/23 0701 - 03/24 0700 In: 955.8 [I.V.:50.8; Blood:795; IV Piggyback:110] Out: 750 [Urine:750]  GENERAL:alert, no distress and comfortable SKIN: skin color, texture, turgor are normal, no rashes or significant lesions EYES: normal, Conjunctiva are pink and non-injected, sclera clear OROPHARYNX:no exudate, no erythema and lips, buccal mucosa, and tongue normal  LUNGS: clear to auscultation and percussion with normal breathing effort HEART: regular rate & rhythm and no murmurs and lower extremity edema present ABDOMEN:abdomen soft, non-tender and normal bowel sounds NEURO: alert & oriented x 3 with fluent speech, no focal motor/sensory  deficits  LABORATORY DATA:  I have reviewed the data as listed CMP Latest Ref Rng & Units 06/05/2020 06/05/2020 06/05/2020  Glucose 70 - 99 mg/dL 136(H) 132(H) 114(H)  BUN 6 - 20 mg/dL $Remove'6 7 7  'zYRXTIY$ Creatinine 0.44 - 1.00 mg/dL 0.42(L) 0.41(L) 0.45  Sodium 135 - 145 mmol/L 136 135 136  Potassium 3.5 - 5.1 mmol/L 4.2 4.2 4.1  Chloride 98 - 111 mmol/L 103 105 105  CO2 22 - 32 mmol/L $RemoveB'25 23 25  'xNvRkZCj$ Calcium 8.9 - 10.3 mg/dL 8.0(L) 7.9(L) 7.9(L)  Total Protein 6.5 - 8.1 g/dL - 6.5 -  Total Bilirubin 0.3 - 1.2 mg/dL - 1.2 -  Alkaline Phos 38 - 126 U/L - 127(H) -  AST 15 - 41 U/L - 66(H) -  ALT 0 - 44 U/L - 28 -    Lab Results  Component Value Date   WBC 3.6 (L) 06/05/2020   HGB 8.6 (L) 06/05/2020   HCT 25.5 (L) 06/05/2020   MCV 91.1 06/05/2020   PLT 160 06/05/2020   NEUTROABS 2.4 06/04/2020    CT HEAD WO CONTRAST  Result Date: 06/05/2020 CLINICAL DATA:  Follow-up examination for stroke. EXAM: CT HEAD WITHOUT CONTRAST TECHNIQUE: Contiguous axial images were obtained from the base of the skull through the vertex without intravenous contrast. COMPARISON:  Prior CT from 08/04/2020. FINDINGS: Brain: Multiple intracranial hemorrhages again seen. Large hematoma at the right temporal lobe is not significantly changed measuring 2.9 x 2.6 x 6.5 cm, similar to previous when measured in a similar fashion. Localized mass effect and edema about this hemorrhage has mildly increased. Additional hemorrhage positioned at the parasagittal right frontal lobe also little interval changed in size measuring 2.7 x 2.5 cm slightly increased edema  about this hemorrhage with localized right-to-left shift. Additional right frontal hemorrhage slightly inferiorly is stable measuring 1.7 cm. Intraparenchymal hemorrhage at the parasagittal left frontal region is little interval changed in size measuring 3.7 x 2.6 cm. Localized edema has mildly increased. Additional scattered small volume subarachnoid hemorrhage little interval changed.  Mild right-to-left midline shift is relatively stable. Stable ventricular size and morphology without hydrocephalus. No other new hemorrhage. No acute large vessel territory infarct. Vascular: No hyperdense vessel. Skull: Large evolving left posterior scalp contusion. Calvarium unchanged. Sinuses/Orbits: Globes orbital soft tissues demonstrate no new abnormality. Visualized paranasal sinuses and mastoid air cells remain largely clear. Other: None. IMPRESSION: 1. No significant interval change in size of multiple intracranial hemorrhages as above, although localized mass effect and edema about these hemorrhages has mildly increased. Mild right-to-left midline shift is relatively stable. No hydrocephalus or trapping. 2. No significant interval change in scattered small volume subarachnoid hemorrhage. 3. No other new acute intracranial abnormality. 4. Large evolving left posterior scalp contusion. Electronically Signed   By: Jeannine Boga M.D.   On: 06/05/2020 04:43   CT HEAD WO CONTRAST  Addendum Date: 06/04/2020   ADDENDUM REPORT: 06/04/2020 15:11 ADDENDUM: These results were called by telephone at the time of interpretation on 06/04/2020 at 3:11 pm to provider [facility] , who verbally acknowledged these results. Electronically Signed   By: Franchot Gallo M.D.   On: 06/04/2020 15:11   Result Date: 06/04/2020 CLINICAL DATA:  Head trauma.  Coagulopathy EXAM: CT HEAD WITHOUT CONTRAST TECHNIQUE: Contiguous axial images were obtained from the base of the skull through the vertex without intravenous contrast. COMPARISON:  CT head 06/03/2020 FINDINGS: Brain: Multiple foci of cerebral hemorrhage have progressed since yesterday. Large hematoma in the right temporal lobe measuring approximately 6.0 X 2.2 cm. This has a mixture of high density and low-density blood. There is local mass-effect and edema. 17 mm adjacent right frontal hematoma also with progression. Right medial frontal high-density hematoma has  enlarged now measuring 25 x 23 mm. Local mass-effect on the frontal horn. Left frontal parietal periventricular hematoma now measures 38 x 25 mm with significant interval enlargement. There is adjacent edema Mild subarachnoid hemorrhage again noted without progression. Ventricle size normal. Mild midline shift to the left. Vascular: Negative for hyperdense vessel Skull: Negative for skull fracture. Sinuses/Orbits: Prior sinus surgery. Mild mucosal edema left maxillary sinus. Negative orbit. Other: Large left parietal scalp hematoma has progressed with further hemorrhage in the scalp. IMPRESSION: 1. Multiple foci of intracranial hemorrhage have progressed since yesterday. Multiple large areas of hemorrhage in the right temporal lobe, right frontal lobe, and left frontal parietal lobe have all increased in size with surrounding edema and local mass-effect. Mild subarachnoid hemorrhage unchanged 2. Large left parietal scalp hematoma has enlarged since yesterday compatible with coagulopathy. Electronically Signed: By: Franchot Gallo M.D. On: 06/04/2020 14:57   CT HEAD WO CONTRAST  Addendum Date: 06/03/2020   ADDENDUM REPORT: 06/03/2020 09:16 ADDENDUM: Critical Value/emergent results were called by telephone at the time of interpretation on 06/03/2020 at 0904 hours to ED provider Linus Galas PA who verbally acknowledged these results. And she advises the patient is coagulopathic with an INR of 2.6. Electronically Signed   By: Genevie Ann M.D.   On: 06/03/2020 09:16   Result Date: 06/03/2020 CLINICAL DATA:  20 year old female status post head trauma from fall with superior convexity subarachnoid hemorrhage. EXAM: CT HEAD WITHOUT CONTRAST TECHNIQUE: Contiguous axial images were obtained from the base of the skull through  the vertex without intravenous contrast. COMPARISON:  Head CT 0115 hours today. FINDINGS: Brain: New small low-density left side subdural hematoma measures 3-4 mm in thickness (series 3, image 16).  There is trace rightward midline shift now. Small volume subarachnoid hemorrhage mostly over the left superior convexity and along the interhemispheric fissure has mildly increased. Superimposed focal hemorrhagic contusions of the right cingulate gyrus (coronal image 21) and left superior frontal gyrus up to 3 cm (coronal image 36) are now apparent with mild regional edema. There is also a more subtle hemorrhagic contusion of the right anterior temporal tip suspected (series 3, image 7 and coronal image 24) with mild edema. Basilar cisterns remain normal. No intraventricular blood or ventriculomegaly. No superimposed acute cortically based infarct. Vascular: No suspicious intracranial vascular hyperdensity. Skull: No skull fracture identified. Sinuses/Orbits: Visualized paranasal sinuses and mastoids are stable and well pneumatized. Other: Expanded and now very large scalp hematoma tracking over most of the superior and left lateral convexity. Areas of hypodense likely hyperacute scalp hemorrhage on series 3, image 18 are new from earlier today. Hematoma thickness up to 16 mm. Visualized orbit soft tissues are within normal limits. IMPRESSION: 1. Multifocal new intracranial hemorrhage, in conjunction with large and expanding left scalp hematoma, raises the possibility of underlying Coagulopathy. 2. New left side subdural hematoma since 0115 hours, 3-4 mm in thickness. 3. Three cerebral hemorrhagic contusions now identified, the largest nearly 3 cm in the left superior frontal gyrus (right cingulate and right anterior temporal tip also). 4. Mildly increased small volume of posttraumatic appearing subarachnoid hemorrhage. 5. Trace rightward midline shift now. Basilar cisterns remain normal. No IVH or ventriculomegaly. 6. No skull fracture identified. Electronically Signed: By: Genevie Ann M.D. On: 06/03/2020 08:47   CT Head Wo Contrast  Result Date: 06/03/2020 CLINICAL DATA:  Head trauma EXAM: CT HEAD WITHOUT CONTRAST  TECHNIQUE: Contiguous axial images were obtained from the base of the skull through the vertex without intravenous contrast. COMPARISON:  None. FINDINGS: Brain: Multifocal acute subarachnoid blood, greatest over the superior parietal lobes. No midline shift or other mass effect. Vascular: No abnormal hyperdensity of the major intracranial arteries or dural venous sinuses. No intracranial atherosclerosis. Skull: Large left parietal scalp hematoma without skull fracture. Sinuses/Orbits: No fluid levels or advanced mucosal thickening of the visualized paranasal sinuses. No mastoid or middle ear effusion. The orbits are normal. IMPRESSION: 1. Multifocal acute subarachnoid blood, greatest over the superior parietal lobes. 2. Large left parietal scalp hematoma without skull fracture. Critical Value/emergent results were called by telephone at the time of interpretation on 06/03/2020 at 1:41 am to provider [facility] , who verbally acknowledged these results. Electronically Signed   By: Ulyses Jarred M.D.   On: 06/03/2020 01:45   CT Angio Chest PE W/Cm &/Or Wo Cm  Result Date: 06/02/2020 CLINICAL DATA:  20 year old female with abdominal pain and concern for pulmonary embolism. EXAM: CT ANGIOGRAPHY CHEST CT ABDOMEN AND PELVIS WITH CONTRAST TECHNIQUE: Multidetector CT imaging of the chest was performed using the standard protocol during bolus administration of intravenous contrast. Multiplanar CT image reconstructions and MIPs were obtained to evaluate the vascular anatomy. Multidetector CT imaging of the abdomen and pelvis was performed using the standard protocol during bolus administration of intravenous contrast. CONTRAST:  188mL OMNIPAQUE IOHEXOL 350 MG/ML SOLN COMPARISON:  Abdominal ultrasound dated 05/05/2020. FINDINGS: CTA CHEST FINDINGS Cardiovascular: There is no cardiomegaly or pericardial effusion. The thoracic aorta is unremarkable. The origins of the great vessels of the aortic arch appear  patent. Faint  linear hyperdensity along the left lateral wall of the pulmonary trunk (56/6) likely artifactual. No pulmonary artery embolus identified. Mediastinum/Nodes: There is no hilar or mediastinal adenopathy. The esophagus is grossly unremarkable. No mediastinal fluid collection. Lungs/Pleura: Bibasilar linear atelectasis/scarring. No focal consolidation, pleural effusion, or pneumothorax. The central airways are patent. Musculoskeletal: No chest wall abnormality. No acute or significant osseous findings. Review of the MIP images confirms the above findings. CT ABDOMEN and PELVIS FINDINGS No intra-abdominal free air.  Small ascites. Hepatobiliary: Severe fatty liver. The liver is enlarged measuring 19 cm in midclavicular length. Correlation with clinical exam and LFTs recommended to evaluate for steatohepatitis. No intrahepatic biliary ductal dilatation. The gallbladder is predominantly contracted. No calcified gallstone. Pancreas: Unremarkable. No pancreatic ductal dilatation or surrounding inflammatory changes. Spleen: Normal in size without focal abnormality. Adrenals/Urinary Tract: The adrenal glands are unremarkable. There is no hydronephrosis on either side. The visualized ureters appear unremarkable. The urinary bladder is minimally distended and grossly unremarkable. Stomach/Bowel: There is no bowel obstruction or active inflammation. The appendix is normal. Vascular/Lymphatic: The abdominal aorta and IVC are unremarkable. No portal venous gas. There is no adenopathy. Reproductive: The uterus is anteverted and grossly unremarkable. No adnexal masses. Other: Mild diffuse subcutaneous edema. Musculoskeletal: No acute or significant osseous findings. Review of the MIP images confirms the above findings. IMPRESSION: 1. No acute intrathoracic pathology. No CT evidence of pulmonary embolism. 2. Severe fatty liver with findings of possible steatohepatitis. Clinical correlation is recommended. 3. Small ascites. 4. No bowel  obstruction. Normal appendix. Electronically Signed   By: Anner Crete M.D.   On: 06/02/2020 21:26   CT ABDOMEN PELVIS W CONTRAST  Result Date: 06/02/2020 CLINICAL DATA:  20 year old female with abdominal pain and concern for pulmonary embolism. EXAM: CT ANGIOGRAPHY CHEST CT ABDOMEN AND PELVIS WITH CONTRAST TECHNIQUE: Multidetector CT imaging of the chest was performed using the standard protocol during bolus administration of intravenous contrast. Multiplanar CT image reconstructions and MIPs were obtained to evaluate the vascular anatomy. Multidetector CT imaging of the abdomen and pelvis was performed using the standard protocol during bolus administration of intravenous contrast. CONTRAST:  174mL OMNIPAQUE IOHEXOL 350 MG/ML SOLN COMPARISON:  Abdominal ultrasound dated 05/05/2020. FINDINGS: CTA CHEST FINDINGS Cardiovascular: There is no cardiomegaly or pericardial effusion. The thoracic aorta is unremarkable. The origins of the great vessels of the aortic arch appear patent. Faint linear hyperdensity along the left lateral wall of the pulmonary trunk (56/6) likely artifactual. No pulmonary artery embolus identified. Mediastinum/Nodes: There is no hilar or mediastinal adenopathy. The esophagus is grossly unremarkable. No mediastinal fluid collection. Lungs/Pleura: Bibasilar linear atelectasis/scarring. No focal consolidation, pleural effusion, or pneumothorax. The central airways are patent. Musculoskeletal: No chest wall abnormality. No acute or significant osseous findings. Review of the MIP images confirms the above findings. CT ABDOMEN and PELVIS FINDINGS No intra-abdominal free air.  Small ascites. Hepatobiliary: Severe fatty liver. The liver is enlarged measuring 19 cm in midclavicular length. Correlation with clinical exam and LFTs recommended to evaluate for steatohepatitis. No intrahepatic biliary ductal dilatation. The gallbladder is predominantly contracted. No calcified gallstone. Pancreas:  Unremarkable. No pancreatic ductal dilatation or surrounding inflammatory changes. Spleen: Normal in size without focal abnormality. Adrenals/Urinary Tract: The adrenal glands are unremarkable. There is no hydronephrosis on either side. The visualized ureters appear unremarkable. The urinary bladder is minimally distended and grossly unremarkable. Stomach/Bowel: There is no bowel obstruction or active inflammation. The appendix is normal. Vascular/Lymphatic: The abdominal aorta and IVC are unremarkable. No  portal venous gas. There is no adenopathy. Reproductive: The uterus is anteverted and grossly unremarkable. No adnexal masses. Other: Mild diffuse subcutaneous edema. Musculoskeletal: No acute or significant osseous findings. Review of the MIP images confirms the above findings. IMPRESSION: 1. No acute intrathoracic pathology. No CT evidence of pulmonary embolism. 2. Severe fatty liver with findings of possible steatohepatitis. Clinical correlation is recommended. 3. Small ascites. 4. No bowel obstruction. Normal appendix. Electronically Signed   By: Anner Crete M.D.   On: 06/02/2020 21:26   EEG adult  Result Date: 06/04/2020 Raenette Rover, MD     06/04/2020  6:20 PM EEG Report Indication: possible seizure activity in setting of SAH This study was recorded in the comatose/not medically induced state.  The duration of the study was 27 minutes.  Electrodes were placed according to the International 10/20 system.  Video was reviewed available for clinical correlation as needed. There was no evidence of organization at any point, with no discernible anterior - posterior voltage or frequency gradient, posterior dominant rhythm or sleep architecture noted at any point. The background instead is comprised of almost exclusively low - moderate amplitude Delta range slowing in the 1 - 2 Hz range.  There was no clear reactivity/state change and minimal variability. There may have been somewhat lower amplitudes  in the left hemisphere as compared to the right, although this was subtle. Hyperventilation: deferred Photic stimulation: deferred There are no clear paroxysmal or epileptiform abnormalities  or interhemispheric asymmetries. Impression: This is an abnormal study due to diffuse, low - moderate attitude Delta slowing with no organization and minimal evidence of reactivity. The background is continuous however. 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 There were slightly lower amplitudes in the left hemisphere  as compared to the right, particularly the posterior region, possibly suggestive of focal neuronal dysfunction, although this was subtle.  There are no clear epileptiform abnormalities.   VAS [place] LOWER EXTREMITY VENOUS (DVT)  Result Date: 06/04/2020  Lower Venous DVT Study Indications: Edema.  Comparison Study: no prior Performing Technologist: Abram Sander RVS  Examination Guidelines: A complete evaluation includes B-mode imaging, spectral Doppler, color Doppler, and power Doppler as needed of all accessible portions of each vessel. Bilateral testing is considered an integral part of a complete examination. Limited examinations for reoccurring indications may be performed as noted. The reflux portion of the exam is performed with the patient in reverse Trendelenburg.  +---------+---------------+---------+-----------+----------+--------------+ RIGHT    CompressibilityPhasicitySpontaneityPropertiesThrombus Aging +---------+---------------+---------+-----------+----------+--------------+ CFV      Full           Yes      Yes                                 +---------+---------------+---------+-----------+----------+--------------+ SFJ      Full                                                        +---------+---------------+---------+-----------+----------+--------------+ FV Prox  Full                                                         +---------+---------------+---------+-----------+----------+--------------+  FV Mid   Full                                                        +---------+---------------+---------+-----------+----------+--------------+ FV DistalFull                                                        +---------+---------------+---------+-----------+----------+--------------+ PFV      Full                                                        +---------+---------------+---------+-----------+----------+--------------+ POP      Full           Yes      Yes                                 +---------+---------------+---------+-----------+----------+--------------+ PTV      Full                                                        +---------+---------------+---------+-----------+----------+--------------+ PERO     Full                                                        +---------+---------------+---------+-----------+----------+--------------+   +---------+---------------+---------+-----------+----------+--------------+ LEFT     CompressibilityPhasicitySpontaneityPropertiesThrombus Aging +---------+---------------+---------+-----------+----------+--------------+ CFV      Full           Yes      Yes                                 +---------+---------------+---------+-----------+----------+--------------+ SFJ      Full                                                        +---------+---------------+---------+-----------+----------+--------------+ FV Prox  Full                                                        +---------+---------------+---------+-----------+----------+--------------+ FV Mid   Full                                                        +---------+---------------+---------+-----------+----------+--------------+  FV DistalFull                                                         +---------+---------------+---------+-----------+----------+--------------+ PFV      Full                                                        +---------+---------------+---------+-----------+----------+--------------+ POP      Full           Yes      Yes                                 +---------+---------------+---------+-----------+----------+--------------+ PTV      Full                                                        +---------+---------------+---------+-----------+----------+--------------+ PERO     Full                                                        +---------+---------------+---------+-----------+----------+--------------+     Summary: BILATERAL: - No evidence of deep vein thrombosis seen in the lower extremities, bilaterally. - No evidence of superficial venous thrombosis in the lower extremities, bilaterally. -No evidence of popliteal cyst, bilaterally.   *See table(s) above for measurements and observations. Electronically signed by Deitra Mayo MD on 06/04/2020 at 5:15:45 AM.    Final    US Abdomen Limited RUQ (LIVER/GB)  Result Date: 06/03/2020 CLINICAL DATA:  Ischemic hepatitis. EXAM: ULTRASOUND ABDOMEN LIMITED RIGHT UPPER QUADRANT COMPARISON:  CT abdomen pelvis 05/05/2020 FINDINGS: Gallbladder: The gallbladder appears to be contracted. No gallstones or wall thickening visualized. No sonographic Murphy sign noted by sonographer. Common bile duct: Diameter: 48mm Liver: Limited evaluation due to positioning. No focal lesion identified. Increased parenchymal echogenicity. Portal vein is patent on color Doppler imaging with normal direction of blood flow towards the liver. Other: None. IMPRESSION: Hepatic steatosis. Please note limited evaluation for focal hepatic masses in a patient with hepatic steatosis due to decreased penetration of the acoustic ultrasound waves. Also limited evaluation due to positioning. Recommend MRI liver protocol.  Electronically Signed   By: Iven Finn M.D.   On: 06/03/2020 22:51    ASSESSMENT AND PLAN: 20 year old female with history of possible rheumatoid arthritis previously on Rinvoq, hypothyroidism admitted with  1) Pancytopenia which appears to have been progressive over the last 1 to 2 months. Anemia could partly be related to blood loss since the patient is complaining of black stools. CT chest abdomen pelvis showed no other overt internal bleeding or retroperitoneal hematoma. 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 Previous use of Rinvoq -however patient has been off this medication since January. Cannot rule out other bone marrow disorder or significant nutritional deficiencies in  presence of severe protein calorie malnutrition.  2) intracranial bleeding in the setting of coagulopathy and fall with head trauma.  3) complex coagulopathy with elevated PT, elevated PTT very severe hypofibrinogenemia. D dimer elevated 06/03/2020 at 4.27 This could be related to DIC with unclear etiology. Also could possibly be related to severe liver issues given albumin of 1.5 and significant transaminitis from Rinvoq in January. Patient is off [place] since January. Severe protein calorie malnutrition Peripheral blood smear did not show overt blasts -needed to rule out complex coagulopathy from APL in the setting of progressive pancytopenia. Preliminary bone marrow biopsy showed no evidence of increased blasts and unlikely to be APL Final bone marrow biopsy results are pending.  4) hypothyroidism-on replacement  5) history of severe transaminitis from Rinvoq.  6) history rheumatoid arthritis-previously on Rinvoq.  Was to be started on Enbrel.  Currently not on any medications and having significant body aches and joint pains.  PLAN  -Labs from 06/05/2020 have been reviewed and showed WBC 3.1, hemoglobin 8.0, hematocrit 24.4, platelets 150,000, fibrinogen normal at 243, PT 13.2,  INR 1.0, PTT 37. -Fibrinogen has been adequately corrected.  Recommend to target a level of 200.   -Platelets have now corrected likely due to steroids.  Would consider platelet transfusion if platelets drop below 50,000 given CNS bleed and recent coagulopathy. -vit K 10mg  po daily x for a total of 5 days, INR has corrected. -transfuse PRBC for hgb<8 or if symptomatic -We will follow up on final bone marrow biopsy results once they are available. -Continue prednisone for her RA -nutritional consultation to optimize po intake. -PPI for GI prophylaxis -CT of the head performed today shows stable intracranial hemorrhages.  Neurosurgery following and there are no plans for surgery.   LOS: 2 days   Mikey Bussing, DNP, AGPCNP-BC, AOCNP 06/05/20   ADDENDUM  .Patient was Personally and independently interviewed, examined and relevant elements of the history of present illness were reviewed in details and an assessment and plan was created. All elements of the patient's history of present illness , assessment and plan were discussed in details with Mikey Bussing, DNP, AGPCNP-BC, AOCNP. The above documentation reflects our combined findings assessment and plan.  Bone marrow biopsy results reviewed as noted below -    secondary changes from autoimmune condition and possible liver disease related to severe starvation.  No overt primary bone marrow pathology noted.  No evidence of leukemia or lymphoma.  -Patient's cytopenias seem to be rapidly improving on steroids.  Her thrombocytopenia resolved but could be an element of immune suppression from her untreated/suboptimally treated underlying autoimmune condition possible rheumatoid arthritis.  Her cytopenias were also partly related to her poor nutritional status with starvation related acute fatty liver.  -Coagulopathy appears to have progressively resolved as well with fibrinogen levels more than 200 and normal prothrombin time and APTT.  This is at  least partially responding to vitamin K resolving DIC.  Unclear etiology for her DIC but possibly from recent liver injury.  Hopefully this continues to stabilize with nutritional therapy and vitamin K replacement. PLAN -would continue prednisone 40mg  with gradual taper to 20 mg over the next 2 weeks and subsequent tapering as per rheumatology based on other rheumatologic plans. -Optimize nutritional intake appropriate supplementation. -Monitor blood counts and coags. -After the first week can allow for fibrinogen to drift down to 150 without transfusion needs for cryo.  -Consider vitamin K 5 mg p.o. daily for additional 2 weeks after completion of her 10  mg daily for 5 days. -Hematology will continue to follow as needed. -Appreciate exceptional MICU cares.    Component     Latest Ref Rng & Units 06/05/2020  WBC     4.0 - 10.5 K/uL 3.6 (L)  RBC     3.87 - 5.11 MIL/uL 2.80 (L)  Hemoglobin     12.0 - 15.0 g/dL 8.6 (L)  HCT     36.0 - 46.0 % 25.5 (L)  MCV     80.0 - 100.0 fL 91.1  MCH     26.0 - 34.0 pg 30.7  MCHC     30.0 - 36.0 g/dL 33.7  RDW     11.5 - 15.5 % 18.9 (H)  Platelets     150 - 400 K/uL 160  nRBC     0.0 - 0.2 % 0.0  Prothrombin Time     11.4 - 15.2 seconds 14.0  INR     0.8 - 1.2 1.1  APTT     24 - 36 seconds 36  Fibrinogen     210 - 475 mg/dL 233     Surgical Pathology  CASE: [id number]  PATIENT: Arnold PRIETO  Bone Marrow Report  Clinical History: coagulopathy ,rule out APML, right posterior iliac,  (ADC)   DIAGNOSIS:   BONE MARROW, ASPIRATE, CLOT, CORE:  -Normocellular bone marrow with trilineage hematopoiesis including  increased megakaryocytes  -See comment   PERIPHERAL BLOOD:  -Pancytopenia   COMMENT:   The bone marrow is generally normocellular for age with trilineage  hematopoiesis. There is relative abundance of granulocytic precursors  with neutrophilic left shift and slightly increased number of  megakaryocytes including  occasional atypical forms. No increase in  blastic cells identified. In the clinical setting of previously known  rheumatoid arthritis, liver disease, etc., I suspect that the myeloid  changes are secondary in nature but correlation with cytogenetic studies  is recommended.      Sullivan Lone MD MS

## 2020-06-05 NOTE — Plan of Care (Signed)
  Problem: Clinical Measurements: Goal: Ability to maintain clinical measurements within normal limits will improve Outcome: Progressing Goal: Will remain free from infection Outcome: Progressing Goal: Respiratory complications will improve Outcome: Progressing Goal: Cardiovascular complication will be avoided Outcome: Progressing   

## 2020-06-05 NOTE — Consult Note (Signed)
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 female patient with PMHx of hypothyroidism, RA, transaminitis presented [facility] ED on 06/02/2020 with vague GI symptoms.  Her transaminitis believed to be related to Rinvoq which was discontinued and her lab in ED demonstrated pancytopenia.  While using bathroom she fell in ED, strike her head and CT showed a small amount of subarachnoid hemorrhage along the convexities bilaterally.  She remained neuro intact.  Due to patient's coagulopathy, her head was rescanned and second CT head showed worsening of hemorrhage but patient continued to remain neuro intact.  CT yesterday on 06/04/2020 showed continued expansion of hemorrhages, surgery is contraindicated.  Psychiatry is consulted for evaluation of depression and anorexia nervosa. Today patient denies feeling sad.  Her mother and brother also present at bedside.  She denies any suicidal or homicidal ideations.  She denies any auditory or visual hallucinations.  She denies change in sleep pattern, loss of interest in daily activities or any passive suicidal ideations.  She denies decrease in concentration and energy.  She notes she has not been eating well from last 3 months because of her GI symptoms and her mother confirms the story.  She states whenever she tries to eat her food she throws up and then has diarrhea and that is why she is lost significant weight in last 3 months but it was totally  unintentional.  She denies restricting her calories and denies any effort to induce vomiting.  Collateral information from mother and brother: Father and brother were present on the patient's bedside.  Mother confirmed that patient was having with GI symptoms and was not able to keep her food down either throwing up or diarrhea.  She did not hear patient trying to induce vomiting and has some time for the patient to the bathroom when she threw up after trying to hurt her food.  Mother is not concerned about anorexia/bulimia and was surprised with these questions.  She states patient was interactive at home and was trying to take care of herself but due to these chronic issues has not been able.  In hospital she feels, patient was not very interactive but starting today morning is able to interact better and mother is not concerned about her daughter being depressed at this point.  Past Psychiatric History: No past psychiatric history.  Patient is not risk to self or others and there is no prior inpatient or outpatient therapy.  Past Medical History:  Past Medical History:  Diagnosis Date  . Bell's palsy   . Hypothyroidism   . Rheumatoid arthritis ([place])    History reviewed. No pertinent surgical history. Family History:  Family History  Problem Relation Age of Onset  . Graves' disease Mother   . Healthy Father   . Diabetes Maternal Grandfather    Family Psychiatric  History: Denies any known family psychiatric history Social History:  Social History   Substance and Sexual Activity  Alcohol Use Not Currently     Allergies:   Allergies  Allergen Reactions  . Other Diarrhea and Other (See Comments)    Omega XL  . Rinvoq [Upadacitinib] Other (See Comments)    Enlarged the patient's liver- had to come to the ED, 2022    Labs:  CBC Latest Ref Rng & Units 06/05/2020 06/04/2020 06/04/2020  WBC 4.0 - 10.5 K/uL 3.1(L) 4.4 4.4  Hemoglobin 12.0 - 15.0 g/dL 8.0(L) 7.8(L) 7.8(L)  Hematocrit  36.0 - 46.0 % 24.4(L) 23.5(L) 24.1(L)  Platelets 150 - 400 K/uL 150 152 157   BMP Latest Ref Rng & Units 06/05/2020 06/05/2020 06/05/2020  Glucose 70 - 99 mg/dL 136(H) 132(H) 114(H)  BUN 6 - 20 mg/dL 6 7 7   Creatinine 0.44 - 1.00 mg/dL 0.42(L) 0.41(L) 0.45  Sodium 135 - 145 mmol/L 136 135 136  Potassium 3.5 - 5.1 mmol/L 4.2 4.2 4.1  Chloride 98 - 111 mmol/L 103 105 105  CO2 22 - 32 mmol/L 25 23 25   Calcium 8.9 - 10.3 mg/dL 8.0(L) 7.9(L) 7.9(L)    Current Facility-Administered Medications  Medication Dose Route Frequency Provider Last Rate Last Admin  . 0.9 %  sodium chloride infusion (Manually program via Guardrails IV Fluids)   Intravenous Once Corey Harold, NP      . 0.9 %  sodium chloride infusion (Manually program via Guardrails IV Fluids)   Intravenous Once Candee Furbish, MD      . 0.9 %  sodium chloride infusion (Manually program via Guardrails IV Fluids)   Intravenous Once Brunetta Genera, MD      . 0.9 %  sodium chloride infusion (Manually program via Guardrails IV Fluids)  250 mL Intravenous Once Brunetta Genera, MD      . acetaminophen (TYLENOL) tablet 650 mg  650 mg Oral Once PRN Judd Lien, MD      . acetaminophen (TYLENOL) tablet 650 mg  650 mg Oral Once Brunetta Genera, MD      . B-complex with vitamin C tablet 1 tablet  1 tablet Oral Daily Brunetta Genera, MD      . Chlorhexidine Gluconate Cloth 2 % PADS 6 each  6 each Topical Daily Candee Furbish, MD   6 each at 06/05/20 [id number]  . diphenhydrAMINE (BENADRYL) injection 12.5 mg  12.5 mg Intravenous Q6H PRN Candee Furbish, MD      . docusate sodium (COLACE) capsule 100 mg  100 mg Oral BID PRN Corey Harold, NP      . EPINEPHrine (ADRENALIN) 1 MG/10ML injection 0.2 mg  0.2 mg Intravenous Once PRN Candee Furbish, MD      . fentaNYL (SUBLIMAZE) injection 50 mcg  50 mcg Intravenous Q2H PRN Candee Furbish, MD      . heparin lock flush 100 unit/mL  500 Units Intracatheter Daily PRN Brunetta Genera, MD      . heparin lock flush 100 unit/mL  250 Units Intracatheter PRN Brunetta Genera, MD      . insulin aspart (novoLOG) injection 0-6 Units  0-6 Units Subcutaneous Q4H Candee Furbish, MD      . levothyroxine (SYNTHROID) tablet 75 mcg  75 mcg Oral QAC breakfast Candee Furbish, MD      . naloxone [facility]) injection 0.4 mg  0.4 mg Intravenous Q5 min PRN Jacky Kindle, MD      . pantoprazole (PROTONIX) injection 40 mg  40 mg Intravenous Q12H Corey Harold, NP   40 mg at 06/05/20 0910  . polyethylene glycol (MIRALAX /  GLYCOLAX) packet 17 g  17 g Oral Daily PRN Corey Harold, NP      . predniSONE (DELTASONE) tablet 40 mg  40 mg Oral Q breakfast Jennelle Human B, NP      . sodium chloride flush (NS) 0.9 % injection 10 mL  10 mL Intracatheter PRN Brunetta Genera, MD      . sodium chloride flush (NS) 0.9 % injection 3 mL  3 mL Intracatheter PRN Brunetta Genera, MD        Musculoskeletal: Strength & Muscle Tone: within normal limits Gait & Station: Deferred Patient leans: N/A  Psychiatric Specialty Exam: Physical Exam Vitals and nursing note reviewed.  Cardiovascular:     Rate and Rhythm: Regular rhythm.  Pulmonary:     Effort: Pulmonary effort is normal.  Abdominal:     Palpations: Abdomen is soft.  Skin:    General: Skin is warm.  Neurological:     Mental Status: She is alert and oriented to person, place, and time.     Review of Systems  Blood pressure (!) 127/100, pulse 65, temperature 98.5 F (36.9 C), temperature source Axillary, resp. rate (!) 30, height 5\' 2"  (4.098 m), weight 51.7 kg, SpO2 100 %.Body mass index is 20.85 kg/m.  General Appearance: Casual  Eye Contact:  Fair  Speech:  Normal Rate  Volume:  Normal  Mood:  Dysphoric  Affect:  Restricted  Thought Process:  Coherent  Orientation:  Full (Time, Place, and Person)  Thought Content:  Logical  Suicidal Thoughts:  No  Homicidal Thoughts:  No  Memory:  Immediate;   Good Recent;    Good Remote;   Good  Judgement:  Good  Insight:  Good  Psychomotor Activity:  Normal  Concentration:  Concentration: Good and Attention Span: Good  Recall:  Good  Fund of Knowledge:  Good  Language:  Good  Akathisia:  No  Handed:  Right  AIMS (if indicated):     Assets:  Communication Skills Desire for Improvement Housing Resilience Social Support Transportation  ADL's:  Intact  Cognition:  WNL  Sleep:      Assessment: 20 year old female with no known past psychiatric history admitted after subarachnoid hemorrhage which is progressing but surgery is contraindicated.  Psychiatry is consulted to evaluate for depression and anorexia nervosa. -Evaluation today patient denies feeling sad, any recent change in her sleep but notes change in appetite or.  She is also evaluated for anorexia nervosa.  She denies restriction of calories intake denies any fear of gaining weight and denies any body image disturbance.  Her mother and brother present at bedside confirms she is not doing it intentionally but is losing weight due to her ongoing GI symptoms. -Discussion with patient with RN, mother and brother present in the room.  Patient is counseled about need of proper calorie intake, her involvement in her own care patient shows good understanding and insight about it and promises to be more in for and be more active and engaged in her care. -Patient is not an imminent risk to self or others.  Patient does not meet criteria for psychiatric inpatient admission.   Treatment Plan: --Based on collateral information and extended psych evaluation patient does not meet criteria for major depressive disorder and eating disorder. --No recommendation from psychiatric team --Psychiatry signing off but will be available for any further questions.  Disposition:  --Patient does not meet criteria for psychiatric inpatient admission.  Honor Junes, MD 06/05/2020 1:18 PM  PGY-1, Resident

## 2020-06-06 LAB — COMPREHENSIVE METABOLIC PANEL
ALT: 29 U/L (ref 0–44)
AST: 81 U/L — ABNORMAL HIGH (ref 15–41)
Albumin: 2 g/dL — ABNORMAL LOW (ref 3.5–5.0)
Alkaline Phosphatase: 144 U/L — ABNORMAL HIGH (ref 38–126)
Anion gap: 6 (ref 5–15)
BUN: 11 mg/dL (ref 6–20)
CO2: 24 mmol/L (ref 22–32)
Calcium: 7.8 mg/dL — ABNORMAL LOW (ref 8.9–10.3)
Chloride: 102 mmol/L (ref 98–111)
Creatinine, Ser: 0.38 mg/dL — ABNORMAL LOW (ref 0.44–1.00)
GFR, Estimated: >60 mL/min (ref >60)
Glucose, Bld: 86 mg/dL (ref 70–99)
Potassium: 3.9 mmol/L (ref 3.5–5.1)
Sodium: 132 mmol/L — ABNORMAL LOW (ref 135–145)
Total Bilirubin: 0.8 mg/dL (ref 0.3–1.2)
Total Protein: 6.7 g/dL (ref 6.5–8.1)

## 2020-06-06 LAB — CBC
HCT: 27.2 % — ABNORMAL LOW (ref 36.0–46.0)
Hemoglobin: 9.2 g/dL — ABNORMAL LOW (ref 12.0–15.0)
MCH: 30.9 pg (ref 26.0–34.0)
MCHC: 33.8 g/dL (ref 30.0–36.0)
MCV: 91.3 fL (ref 80.0–100.0)
Platelets: 174 10*3/uL (ref 150–400)
RBC: 2.98 MIL/uL — ABNORMAL LOW (ref 3.87–5.11)
RDW: 18.6 % — ABNORMAL HIGH (ref 11.5–15.5)
WBC: 5.2 10*3/uL (ref 4.0–10.5)
nRBC: 0.4 % — ABNORMAL HIGH (ref 0.0–0.2)

## 2020-06-06 LAB — PHOSPHORUS
Phosphorus: 3.3 mg/dL (ref 2.5–4.6)
Phosphorus: 3 mg/dL (ref 2.5–4.6)

## 2020-06-06 LAB — GLUCOSE, CAPILLARY
Glucose-Capillary: 107 mg/dL — ABNORMAL HIGH (ref 70–99)
Glucose-Capillary: 114 mg/dL — ABNORMAL HIGH (ref 70–99)
Glucose-Capillary: 138 mg/dL — ABNORMAL HIGH (ref 70–99)
Glucose-Capillary: 69 mg/dL — ABNORMAL LOW (ref 70–99)
Glucose-Capillary: 78 mg/dL (ref 70–99)
Glucose-Capillary: 97 mg/dL (ref 70–99)
Glucose-Capillary: 98 mg/dL (ref 70–99)

## 2020-06-06 LAB — PROTIME-INR
INR: 1.1 (ref 0.8–1.2)
Prothrombin Time: 13.8 seconds (ref 11.4–15.2)

## 2020-06-06 LAB — MAGNESIUM
Magnesium: 2.3 mg/dL (ref 1.7–2.4)
Magnesium: 2 mg/dL (ref 1.7–2.4)

## 2020-06-06 LAB — LUPUS ANTICOAGULANT PANEL
DRVVT: 40.3 s (ref 0.0–47.0)
PTT Lupus Anticoagulant: 44.8 s (ref 0.0–51.9)

## 2020-06-06 LAB — RETICULOCYTES
Immature Retic Fract: 26.4 % — ABNORMAL HIGH (ref 2.3–15.9)
RBC.: 3.07 MIL/uL — ABNORMAL LOW (ref 3.87–5.11)
Retic Count, Absolute: 102.2 10*3/uL (ref 19.0–186.0)
Retic Ct Pct: 3.3 % — ABNORMAL HIGH (ref 0.4–3.1)

## 2020-06-06 LAB — BETA-2-GLYCOPROTEIN I ABS, IGG/M/A
Beta-2 Glyco I IgG: <9 GPI IgG units (ref 0–20)
Beta-2-Glycoprotein I IgA: <9 GPI IgA units (ref 0–25)
Beta-2-Glycoprotein I IgM: 21 GPI IgM units (ref 0–32)

## 2020-06-06 LAB — FIBRINOGEN: Fibrinogen: 199 mg/dL — ABNORMAL LOW (ref 210–475)

## 2020-06-06 LAB — FACTOR 10 ASSAY: Factor X Activity: 107 % (ref 76–183)

## 2020-06-06 LAB — APTT: aPTT: 35 seconds (ref 24–36)

## 2020-06-06 LAB — VITAMIN D 25 HYDROXY (VIT D DEFICIENCY, FRACTURES): Vit D, 25-Hydroxy: 27.24 ng/mL — ABNORMAL LOW (ref 30–100)

## 2020-06-06 LAB — IMMATURE PLATELET FRACTION: Immature Platelet Fraction: 10.5 % — ABNORMAL HIGH (ref 1.2–8.6)

## 2020-06-06 LAB — FACTOR 5 ASSAY: Factor V Activity: 152 % — ABNORMAL HIGH (ref 70–150)

## 2020-06-06 LAB — FACTOR 8 ASSAY: Coagulation Factor VIII: 180 % — ABNORMAL HIGH (ref 56–140)

## 2020-06-06 MED ORDER — FENTANYL CITRATE (PF) 100 MCG/2ML IJ SOLN: 25.0000 ug | INTRAMUSCULAR | Status: DC | PRN

## 2020-06-06 MED ORDER — OSMOLITE 1.2 CAL PO LIQD
1000.0000 mL | ORAL | Status: DC
  Administered 2020-06-06 – 2020-06-07 (×2): 1000 mL
  Filled 2020-06-06: qty 1000

## 2020-06-06 MED ORDER — ADULT MULTIVITAMIN W/MINERALS CH
1.0000 | ORAL_TABLET | Freq: Every day | ORAL | Status: DC
  Administered 2020-06-06 – 2020-06-07 (×2): 1
  Filled 2020-06-06 (×2): qty 1

## 2020-06-06 MED ORDER — PHYTONADIONE 5 MG PO TABS
5.0000 mg | ORAL_TABLET | Freq: Every day | ORAL | Status: DC
  Administered 2020-06-10 – 2020-06-16 (×7): 5 mg via ORAL
  Filled 2020-06-06 (×7): qty 1

## 2020-06-06 MED ORDER — SODIUM CHLORIDE 0.9 % IV SOLN
INTRAVENOUS | Status: DC | PRN
  Administered 2020-06-06: 1000 mL via INTRAVENOUS

## 2020-06-06 MED ORDER — OXYCODONE-ACETAMINOPHEN 5-325 MG PO TABS: 1.0000 | ORAL_TABLET | Freq: Four times a day (QID) | ORAL | Status: DC | PRN

## 2020-06-06 MED ORDER — OXYCODONE HCL 5 MG PO TABS
5.0000 mg | ORAL_TABLET | ORAL | Status: DC | PRN
  Administered 2020-06-07 – 2020-06-11 (×6): 5 mg via ORAL
  Filled 2020-06-06 (×7): qty 1

## 2020-06-06 MED ORDER — ACETAMINOPHEN 325 MG PO TABS
650.0000 mg | ORAL_TABLET | Freq: Four times a day (QID) | ORAL | Status: DC | PRN
  Administered 2020-06-06 – 2020-06-14 (×6): 650 mg via ORAL
  Filled 2020-06-06 (×7): qty 2

## 2020-06-06 MED ORDER — PHENOL 1.4 % MT LIQD
1.0000 | OROMUCOSAL | Status: DC | PRN
  Administered 2020-06-08 – 2020-06-09 (×2): 1 via OROMUCOSAL
  Filled 2020-06-06: qty 177

## 2020-06-06 MED ORDER — THIAMINE HCL 100 MG PO TABS
100.0000 mg | ORAL_TABLET | Freq: Every day | ORAL | Status: DC
  Administered 2020-06-06 – 2020-06-07 (×2): 100 mg
  Filled 2020-06-06 (×2): qty 1

## 2020-06-06 MED ORDER — ONDANSETRON HCL 4 MG/2ML IJ SOLN: 4.0000 mg | Freq: Four times a day (QID) | INTRAMUSCULAR | Status: DC | PRN

## 2020-06-06 MED ORDER — PHYTONADIONE 5 MG PO TABS
10.0000 mg | ORAL_TABLET | Freq: Every day | ORAL | Status: AC
  Administered 2020-06-06 – 2020-06-09 (×4): 10 mg via ORAL
  Filled 2020-06-06 (×4): qty 2

## 2020-06-06 MED ORDER — PANTOPRAZOLE SODIUM 40 MG PO TBEC
40.0000 mg | DELAYED_RELEASE_TABLET | Freq: Every day | ORAL | Status: DC
  Administered 2020-06-06 – 2020-06-13 (×8): 40 mg via ORAL
  Filled 2020-06-06 (×8): qty 1

## 2020-06-06 NOTE — Progress Notes (Signed)
Pt. Stated that she would like to have her covid vaccine. Per CCMD, will reassess in a few days to determine if it is safe to do so during this admission.

## 2020-06-06 NOTE — Procedures (Signed)
Cortrak  Person Inserting Tube:  Angi Goodell, RD Tube Type:  Cortrak - 43 inches Tube Location:  Left nare Initial Placement:  Stomach Secured by: Bridle Technique Used to Measure Tube Placement:  Documented cm marking at nare/ corner of mouth Cortrak Secured At:  65 cm   No x-ray is required. RN may begin using tube.   If the tube becomes dislodged please keep the tube and contact the Cortrak team at [email or web address] (password [email or web address]) for replacement.  If after hours and replacement cannot be delayed, place a NG tube and confirm placement with an abdominal x-ray.    Jordy Verba RD, LDN Clinical Nutrition Pager listed in AMION    

## 2020-06-06 NOTE — Progress Notes (Signed)
eLink Physician-Brief Progress Note Patient Name: Jessica Frazier DOB: January 11, 2001 MRN: [id number]   Date of Service  06/06/2020  HPI/Events of Note  Patient having  Some nausea, no headache, per bedside RN the nausea is rated "mild". QTc .28  eICU Interventions  PRN Zofran ordered.        Kerry Kass Lyda Colcord 06/06/2020, 5:07 AM

## 2020-06-06 NOTE — Progress Notes (Signed)
Initial Nutrition Assessment  DOCUMENTATION CODES:   Severe malnutrition in context of chronic illness  INTERVENTION:   Initiate tube feeding via Cortrak tube: Osmolite 1.5 at 25 ml/h and increase by 10 ml every 12 hours to goal rate of 55 ml/hr (1320 ml per day)  Provides 1980 kcal, 82 gm protein, 1003 ml free water daily  MVI with minerals  Thiamine 100 mg daily   Check vitamin labs:  Vitamin C Vitamin D Vitamin B1 Vitamin B6 Vitamin A Zinc    NUTRITION DIAGNOSIS:   Severe Malnutrition related to chronic illness (> 3 months) as evidenced by severe muscle depletion,severe fat depletion,moderate fat depletion,energy intake < or equal to 75% for > or equal to 1 month.  GOAL:   Patient will meet greater than or equal to 90% of their needs  MONITOR:   PO intake,TF tolerance  REASON FOR ASSESSMENT:   Consult Enteral/tube feeding initiation and management,Assessment of nutrition requirement/status,Calorie Count  ASSESSMENT:   Pt with PMH of hypothyroidism, Bell's palsy, and possible seronegative RA admitted with coagulopathy, macrocytic anemia, thrombocytopenia, leukopenia of unknown etiology, fatty liver with chronic N/V/D x 2 months, and fall in ED resulting in TBI (SDH, SAH, ICH R frontal contusion).    Pt discussed during ICU rounds and with RN.   Spoke with pt in [place]. Mom in room but did not participate. Pt would not make eye contact during interview. She reports that she goes to a high school and has no friends which is really hard for her. She does not eat breakfast or lunch at school and feels afraid to eat. She is ok with me starting TF when asked.  She reports that she has constipation at home and uses pills to help with this but did not know which one. She states that she uses this weekly. Pt reports that she gained a lot of weight from 100 lb to 135 lb without trying then a lot of weight possibly in the past year but she is not specific. She reports very  poor intake during this time but again is not specific.  When asked about where she eats lunch at school pt states that she did not have that kind of childhood. When asked her to clairify this pt make wide-eyed eye contact and said "you know what I mean". She then did not make eye contact again.  Spoke with MD, vitamin labs ordered, TF ordered, refeeding labs ordered as well as MVI and thiamine.   Pt very weak on exam.    Medications reviewed and include: B-complex with vitamin C, vitamin K, prednisone Labs reviewed: Na 132   NUTRITION - FOCUSED PHYSICAL EXAM:  Flowsheet Row Most Recent Value  Orbital Region Moderate depletion  Upper Arm Region Severe depletion  Thoracic and Lumbar Region No depletion  Buccal Region Moderate depletion  Temple Region Moderate depletion  Clavicle Bone Region Moderate depletion  Clavicle and Acromion Bone Region Severe depletion  Scapular Bone Region Severe depletion  Dorsal Hand Severe depletion  Patellar Region No depletion  Anterior Thigh Region No depletion  Posterior Calf Region No depletion  Edema (RD Assessment) Moderate  Hair Reviewed  Grier Rocher pluckable]  Eyes Reviewed  Mouth Reviewed  Skin Reviewed  [dry patches]  Nails Unable to assess  [fake nails]       Diet Order:   Diet Order            Diet regular Room service appropriate? Yes; Fluid consistency: Thin  Diet effective now  EDUCATION NEEDS:   Education needs have been addressed  Skin:  Skin Assessment: Reviewed RN Assessment  Last BM:  3/24 small; type 7  Height:   Ht Readings from Last 1 Encounters:  06/03/20 5\' 2"  (1.575 m)    Weight:   Wt Readings from Last 1 Encounters:  06/06/20 55.6 kg    Ideal Body Weight:  50 kg  BMI:  Body mass index is 22.42 kg/m.  Estimated Nutritional Needs:   Kcal:  1900-2100  Protein:  80-95 grams  Fluid:  >2 L/day  Lockie Pares., RD, LDN, CNSC See AMiON for contact information

## 2020-06-06 NOTE — Progress Notes (Signed)
NAME:  Jessica Frazier, MRN:  [id number], DOB:  12/31/2000, LOS: 3 ADMISSION DATE:  06/02/2020, CONSULTATION DATE:  3/22 REFERRING MD:  Charlyne Quale ED-PA, CHIEF COMPLAINT:  ICH  History of Present Illness:  20 year old female with PMH as below, which is significant for sero negative RA and hypothyroid. She was recently admitted in January of this year after having LFT elevation on routine labs. This was felt to be a result of Rinvoq, which she was taking for RA. The following is from the discharge summary.   Sent over from Rheumatology office given elevated Transaminases with AST over 1000.  On Admission AST was 1069 and ALT was 284.  Alk Phos also slightly elevated at 185.  Also had mild upper right quadrant pain with negative Murphy's sign.  Patient got significant lab work-up.  Hepatitis Panel , hCG, HIV, Monospot, Ethanol and Acetaminophen were all negative.  PT and aPTT were also normal.  RUQ was also obtained and showed liver inflammation, and some biliary sludge but no stone, blockage or cholecystitis.  Believe elevations were due to Rinvoq medication which patient had been taking for Rheumatoid Arthritis.  Stopped medication and did not restart at discharge.  LFT's slightly decreased at time of discharge and felt patient could be safely monitored outpatient.  She presented again to [facility] ED 3/21 with complaints of abdominal pain and dark stools for approximately 4 days. Pain is intermittent and localized to LUQ and RLQ. She did have one episode of emesis on 3/20, but is was not bloody in appearance. Stools were only dark, no bright red blood noted. As her symptoms were not improving, she presented to the ED 3/21. While using the restroom in the ED, she lost balance and fell, striking her head. CT showed multifocal acute SAH. Labs showed thrombocytopenia and platelets 43. Neurosurgery consulted. Patient was unable to complete FFP due to fever during transfusion. Repeat CT showed expanding SAH as  well as new SDH and ICH. PCCM asked to admit and hematology consulted.   Pertinent  Medical History   has a past medical history of Bell's palsy, Hypothyroidism, and Rheumatoid arthritis ([place]).  Significant Hospital Events: Including procedures, antibiotic start and stop dates in addition to other pertinent events   . 3/22 admitted to ICU with traumatic ICH, SDH, SAH. . 3/22 CT abdomen/pelvis > severe fatty liver with findings of possible steatohepatitis. Small ascites.  . CT head > Multifocal new ICH, Expanding L scalp hematoma, new L SDH. Thre cerebral hemorrhagic contusions. Mildly worse SAH. Trace rightward midline shift.  . 3/23 BM bx, continued prednisone, transfused cryo and PRBC  Interim History / Subjective:  NAEO  Denies speaking english this morning, then denies speaking spanish (in [place]) with translator and states (in [place]) that she prefers [place] she is hungry but also that she has no appetite  Overall rather challenging to illicit consistent interview/exam from variable patient participation    Has only had 350m UOP past 24 hrs  CBGs low/low-normal -- pt refused juice until discussion of Cortrak placement   Objective   Blood pressure (!) 113/95, pulse 65, temperature 97.6 F (36.4 C), temperature source Oral, resp. rate (!) 24, height 5' 2"  (1.575 m), weight 55.6 kg, SpO2 99 %.        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    Stable neuro exam  -no surgical indication, NSGY following, re-image PRN -delirium precautions  -minimize CNS depressing meds   Hematologic abnormalities- improving after product resusc  Coagulopathy, macrocytic anemia, thrombocytopenia, leukopenia Etiology unknown - differential complicated by poor PO intake, recent acute liver injury, recent upadacitinib and poorly defined polyarthritis over past 2 years.  APL with complex coagulopathy has been suggested as a possible cause BMBx on 3/23 : no blasts on smear, final path pending, ATRA DC'd.  Overall just seems like malnutrition + recent liver injury. - Vitamin K for 3 weeks per heme - Monitor PTT/INR/Fibrinogen/CBC daily. Transfuse to plts 50, INR <1.5, fibrinogen 200, Hgb 7  Fatty liver -r/t nutritional status Chronic n/v/d x 2 months- Unclear cause, acute hepatitis last month was attributed to upadacitinib.  - GI signed off 3/23 -supportive care for sx   ?Seronegative RA-- polyarthritis, LE swelling.  ENA neg as OP - further rhem workup pending- cardiolipins, factors, lupus anticoagulant panel, flow cytometry  - continue prednisone 40 mg daily for now - Supportive care - OP rheum   Hypothyroidism  TSH mildly elevated - continue synthroid, OP f/u  Protein calorie malnutrition- severe -RDN consult -Placed cortrak order -- if PO intake doesn't improve 3/25 AM needs placement  -psych consulted -- no concern for eating disorder   AKI with oliguria-body mass is so low that Cr is not a reliable lab for assessment  - 24h urine protein underway -trend I/O renal indices    Best practice (evaluated daily)  Diet:  Oral Pain/Anxiety/Delirium protocol (if indicated): No VAP protocol (if indicated): Not indicated DVT prophylaxis: Contraindicated GI prophylaxis:  PPI Glucose control:  SSI No Central venous access:  N/A Arterial line:  N/A Foley:  N/A Mobility:  bed rest  PT consulted: N/A Last date of multidisciplinary goals of care discussion [FULL CODE] Code Status:  full code Disposition: transfer out of ICU   Erskine Emery MD PCCM

## 2020-06-06 NOTE — Progress Notes (Signed)
Does have proteinuria but not nephrotic range.  Erskine Emery MD PCCM

## 2020-06-06 NOTE — Progress Notes (Signed)
Providing Compassionate, Quality Care - Together   Subjective: Patient reports no new issues.  Objective: Vital signs in last 24 hours: Temp:  [97.3 F (36.3 C)-98.5 F (36.9 C)] 97.3 F (36.3 C) (03/25 0800) Pulse Rate:  [65] 65 (03/24 1000) Resp:  [19-30] 24 (03/25 0700) BP: (108-146)/(76-103) 113/95 (03/25 0700) SpO2:  [97 %-100 %] 99 % (03/25 0600) Weight:  [55.6 kg] 55.6 kg (03/25 0300)  Intake/Output from previous day: 03/24 0701 - 03/25 0700 In: -  Out: 350 [Urine:350] Intake/Output this shift: No intake/output data recorded.  Alert and oriented x 4 PERRLA, 4 brisk Follows commands MAE, generalized weakness; exam somewhat limited due to joint pain BLE 2+ pitting edema   Lab Results: Recent Labs    06/05/20 1236 06/06/20 0321  WBC 3.6* 5.2  HGB 8.6* 9.2*  HCT 25.5* 27.2*  PLT 160 174   BMET Recent Labs    06/05/20 0906 06/06/20 0321  NA 136 132*  K 4.2 3.9  CL 103 102  CO2 25 24  GLUCOSE 136* 86  BUN 6 11  CREATININE 0.42* 0.38*  CALCIUM 8.0* 7.8*    Studies/Results: CT HEAD WO CONTRAST  Result Date: 06/05/2020 CLINICAL DATA:  Follow-up examination for stroke. EXAM: CT HEAD WITHOUT CONTRAST TECHNIQUE: Contiguous axial images were obtained from the base of the skull through the vertex without intravenous contrast. COMPARISON:  Prior CT from 08/04/2020. FINDINGS: Brain: Multiple intracranial hemorrhages again seen. Large hematoma at the right temporal lobe is not significantly changed measuring 2.9 x 2.6 x 6.5 cm, similar to previous when measured in a similar fashion. Localized mass effect and edema about this hemorrhage has mildly increased. Additional hemorrhage positioned at the parasagittal right frontal lobe also little interval changed in size measuring 2.7 x 2.5 cm slightly increased edema about this hemorrhage with localized right-to-left shift. Additional right frontal hemorrhage slightly inferiorly is stable measuring 1.7 cm.  Intraparenchymal hemorrhage at the parasagittal left frontal region is little interval changed in size measuring 3.7 x 2.6 cm. Localized edema has mildly increased. Additional scattered small volume subarachnoid hemorrhage little interval changed. Mild right-to-left midline shift is relatively stable. Stable ventricular size and morphology without hydrocephalus. No other new hemorrhage. No acute large vessel territory infarct. Vascular: No hyperdense vessel. Skull: Large evolving left posterior scalp contusion. Calvarium unchanged. Sinuses/Orbits: Globes orbital soft tissues demonstrate no new abnormality. Visualized paranasal sinuses and mastoid air cells remain largely clear. Other: None. IMPRESSION: 1. No significant interval change in size of multiple intracranial hemorrhages as above, although localized mass effect and edema about these hemorrhages has mildly increased. Mild right-to-left midline shift is relatively stable. No hydrocephalus or trapping. 2. No significant interval change in scattered small volume subarachnoid hemorrhage. 3. No other new acute intracranial abnormality. 4. Large evolving left posterior scalp contusion. Electronically Signed   By: Jeannine Boga M.D.   On: 06/05/2020 04:43   CT HEAD WO CONTRAST  Addendum Date: 06/04/2020   ADDENDUM REPORT: 06/04/2020 15:11 ADDENDUM: These results were called by telephone at the time of interpretation on 06/04/2020 at 3:11 pm to provider [facility] , who verbally acknowledged these results. Electronically Signed   By: Franchot Gallo M.D.   On: 06/04/2020 15:11   Result Date: 06/04/2020 CLINICAL DATA:  Head trauma.  Coagulopathy EXAM: CT HEAD WITHOUT CONTRAST TECHNIQUE: Contiguous axial images were obtained from the base of the skull through the vertex without intravenous contrast. COMPARISON:  CT head 06/03/2020 FINDINGS: Brain: Multiple foci of cerebral hemorrhage  have progressed since yesterday. Large hematoma in the right temporal  lobe measuring approximately 6.0 X 2.2 cm. This has a mixture of high density and low-density blood. There is local mass-effect and edema. 17 mm adjacent right frontal hematoma also with progression. Right medial frontal high-density hematoma has enlarged now measuring 25 x 23 mm. Local mass-effect on the frontal horn. Left frontal parietal periventricular hematoma now measures 38 x 25 mm with significant interval enlargement. There is adjacent edema Mild subarachnoid hemorrhage again noted without progression. Ventricle size normal. Mild midline shift to the left. Vascular: Negative for hyperdense vessel Skull: Negative for skull fracture. Sinuses/Orbits: Prior sinus surgery. Mild mucosal edema left maxillary sinus. Negative orbit. Other: Large left parietal scalp hematoma has progressed with further hemorrhage in the scalp. IMPRESSION: 1. Multiple foci of intracranial hemorrhage have progressed since yesterday. Multiple large areas of hemorrhage in the right temporal lobe, right frontal lobe, and left frontal parietal lobe have all increased in size with surrounding edema and local mass-effect. Mild subarachnoid hemorrhage unchanged 2. Large left parietal scalp hematoma has enlarged since yesterday compatible with coagulopathy. Electronically Signed: By: Franchot Gallo M.D. On: 06/04/2020 14:57   EEG adult  Result Date: 06/04/2020 Raenette Rover, MD     06/04/2020  6:20 PM EEG Report Indication: possible seizure activity in setting of SAH This study was recorded in the comatose/not medically induced state.  The duration of the study was 27 minutes.  Electrodes were placed according to the International 10/20 system.  Video was reviewed available for clinical correlation as needed. There was no evidence of organization at any point, with no discernible anterior - posterior voltage or frequency gradient, posterior dominant rhythm or sleep architecture noted at any point. The background instead is comprised of  almost exclusively low - moderate amplitude Delta range slowing in the 1 - 2 Hz range.  There was no clear reactivity/state change and minimal variability. There may have been somewhat lower amplitudes in the left hemisphere as compared to the right, although this was subtle. Hyperventilation: deferred Photic stimulation: deferred There are no clear paroxysmal or epileptiform abnormalities  or interhemispheric asymmetries. Impression: This is an abnormal study due to diffuse, low - moderate attitude Delta slowing with no organization and minimal evidence of reactivity. The background is continuous however. 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 There were slightly lower amplitudes in the left hemisphere  as compared to the right, particularly the posterior region, possibly suggestive of focal neuronal dysfunction, although this was subtle.  There are no clear epileptiform abnormalities.    Assessment/Plan: Patient presented to the emergency department on the evening of 06/02/2020 with vague GI symptoms. She has a history of RA and prior transaminitis believed to be related to Rinvoq, which was discontinued. Her lab work in the ED demonstrated pancytopenia. She fell while in the ED, striking her head. CT head showed a small amount of subarachnoid hemorrhage along the convexities bilaterally. She remained neuro intact. Due to patient's coagulopathy, her head was rescanned. The second CT head showed worsening of the hemorrhage. Patient continues to remain neuro intact. CT performed yesterday afternoon showed continued expansion of hemorrhages. Surgery is still contraindicated. Patient's neurological exam is close to baseline.   LOS: 3 days    -Transfusions and medical management per Critical Care Medicine and hematology/oncology. -No new Neurosurgical recommendations at this time. Rescan only necessary if neurologic decline. Patient is fine to move out of the ICU when CCM  feels she is medically stable.    Viona Gilmore, DNP, AGNP-C Nurse Practitioner  [facility] [address], [address], [place], [place] [address] P: [phone]    F: [phone]  06/06/2020, 9:03 AM

## 2020-06-07 DIAGNOSIS — R6510 Systemic inflammatory response syndrome (SIRS) of non-infectious origin without acute organ dysfunction: Secondary | ICD-10-CM

## 2020-06-07 DIAGNOSIS — E43 Unspecified severe protein-calorie malnutrition: Secondary | ICD-10-CM | POA: Insufficient documentation

## 2020-06-07 DIAGNOSIS — D649 Anemia, unspecified: Secondary | ICD-10-CM | POA: Diagnosis not present

## 2020-06-07 DIAGNOSIS — K760 Fatty (change of) liver, not elsewhere classified: Secondary | ICD-10-CM | POA: Diagnosis not present

## 2020-06-07 DIAGNOSIS — D689 Coagulation defect, unspecified: Secondary | ICD-10-CM | POA: Diagnosis not present

## 2020-06-07 DIAGNOSIS — I609 Nontraumatic subarachnoid hemorrhage, unspecified: Secondary | ICD-10-CM | POA: Diagnosis not present

## 2020-06-07 LAB — PROTIME-INR
INR: 1.1 (ref 0.8–1.2)
Prothrombin Time: 13.9 seconds (ref 11.4–15.2)

## 2020-06-07 LAB — COMPREHENSIVE METABOLIC PANEL
ALT: 82 U/L — ABNORMAL HIGH (ref 0–44)
ALT: 87 U/L — ABNORMAL HIGH (ref 0–44)
AST: 218 U/L — ABNORMAL HIGH (ref 15–41)
AST: 237 U/L — ABNORMAL HIGH (ref 15–41)
Albumin: 1.9 g/dL — ABNORMAL LOW (ref 3.5–5.0)
Albumin: 1.9 g/dL — ABNORMAL LOW (ref 3.5–5.0)
Alkaline Phosphatase: 193 U/L — ABNORMAL HIGH (ref 38–126)
Alkaline Phosphatase: 183 U/L — ABNORMAL HIGH (ref 38–126)
Anion gap: 7 (ref 5–15)
Anion gap: 5 (ref 5–15)
BUN: 6 mg/dL (ref 6–20)
BUN: 5 mg/dL — ABNORMAL LOW (ref 6–20)
CO2: 25 mmol/L (ref 22–32)
CO2: 27 mmol/L (ref 22–32)
Calcium: 7.9 mg/dL — ABNORMAL LOW (ref 8.9–10.3)
Calcium: 7.9 mg/dL — ABNORMAL LOW (ref 8.9–10.3)
Chloride: 104 mmol/L (ref 98–111)
Chloride: 105 mmol/L (ref 98–111)
Creatinine, Ser: 0.41 mg/dL — ABNORMAL LOW (ref 0.44–1.00)
Creatinine, Ser: 0.45 mg/dL (ref 0.44–1.00)
GFR, Estimated: >60 mL/min (ref >60)
GFR, Estimated: >60 mL/min (ref >60)
Glucose, Bld: 95 mg/dL (ref 70–99)
Glucose, Bld: 96 mg/dL (ref 70–99)
Potassium: 3.6 mmol/L (ref 3.5–5.1)
Potassium: 3.6 mmol/L (ref 3.5–5.1)
Sodium: 136 mmol/L (ref 135–145)
Sodium: 137 mmol/L (ref 135–145)
Total Bilirubin: 0.8 mg/dL (ref 0.3–1.2)
Total Bilirubin: 0.8 mg/dL (ref 0.3–1.2)
Total Protein: 6.5 g/dL (ref 6.5–8.1)
Total Protein: 6.7 g/dL (ref 6.5–8.1)

## 2020-06-07 LAB — GLUCOSE, CAPILLARY
Glucose-Capillary: 84 mg/dL (ref 70–99)
Glucose-Capillary: 96 mg/dL (ref 70–99)
Glucose-Capillary: 124 mg/dL — ABNORMAL HIGH (ref 70–99)
Glucose-Capillary: 120 mg/dL — ABNORMAL HIGH (ref 70–99)
Glucose-Capillary: 134 mg/dL — ABNORMAL HIGH (ref 70–99)

## 2020-06-07 LAB — MAGNESIUM
Magnesium: 2 mg/dL (ref 1.7–2.4)
Magnesium: 2 mg/dL (ref 1.7–2.4)

## 2020-06-07 LAB — CBC
HCT: 29.2 % — ABNORMAL LOW (ref 36.0–46.0)
Hemoglobin: 10.1 g/dL — ABNORMAL LOW (ref 12.0–15.0)
MCH: 31.9 pg (ref 26.0–34.0)
MCHC: 34.6 g/dL (ref 30.0–36.0)
MCV: 92.1 fL (ref 80.0–100.0)
Platelets: 168 10*3/uL (ref 150–400)
RBC: 3.17 MIL/uL — ABNORMAL LOW (ref 3.87–5.11)
RDW: 18.1 % — ABNORMAL HIGH (ref 11.5–15.5)
WBC: 5.5 10*3/uL (ref 4.0–10.5)
nRBC: 0 % (ref 0.0–0.2)

## 2020-06-07 LAB — CARDIOLIPIN ANTIBODIES, IGG, IGM, IGA
Anticardiolipin IgA: <9 APL U/mL (ref 0–11)
Anticardiolipin IgG: 10 GPL U/mL (ref 0–14)
Anticardiolipin IgM: 115 MPL U/mL — ABNORMAL HIGH (ref 0–12)

## 2020-06-07 LAB — APTT: aPTT: 33 seconds (ref 24–36)

## 2020-06-07 LAB — CK: Total CK: 20 U/L — ABNORMAL LOW (ref 38–234)

## 2020-06-07 LAB — PHOSPHORUS
Phosphorus: 2.7 mg/dL (ref 2.5–4.6)
Phosphorus: 2.8 mg/dL (ref 2.5–4.6)

## 2020-06-07 MED ORDER — LEVOTHYROXINE SODIUM 75 MCG PO TABS: 75.0000 ug | ORAL_TABLET | Freq: Every day | ORAL | Status: DC

## 2020-06-07 MED ORDER — THIAMINE HCL 100 MG PO TABS
100.0000 mg | ORAL_TABLET | Freq: Every day | ORAL | Status: DC
  Administered 2020-06-08 – 2020-06-16 (×9): 100 mg via ORAL
  Filled 2020-06-07 (×9): qty 1

## 2020-06-07 MED ORDER — CHOLECALCIFEROL 10 MCG/ML (400 UNIT/ML) PO LIQD
400.0000 [IU] | Freq: Every day | ORAL | Status: DC
  Administered 2020-06-07: 400 [IU] via ORAL
  Filled 2020-06-07 (×2): qty 1

## 2020-06-07 MED ORDER — VITAMIN D 25 MCG (1000 UNIT) PO TABS
1000.0000 [IU] | ORAL_TABLET | Freq: Every day | ORAL | Status: DC
  Administered 2020-06-08 – 2020-06-16 (×9): 1000 [IU] via ORAL
  Filled 2020-06-07 (×9): qty 1

## 2020-06-07 MED ORDER — SODIUM CHLORIDE 0.9 % IV SOLN: INTRAVENOUS | Status: AC

## 2020-06-07 MED ORDER — ADULT MULTIVITAMIN W/MINERALS CH
1.0000 | ORAL_TABLET | Freq: Every day | ORAL | Status: DC
  Administered 2020-06-08 – 2020-06-16 (×9): 1 via ORAL
  Filled 2020-06-07 (×9): qty 1

## 2020-06-07 NOTE — Progress Notes (Addendum)
[facility] PROGRESS NOTE    Progress Note  Jessica Frazier  YIR:[id number] DOB: Nov 08, 2000 DOA: 06/02/2020 PCP: Pcp, No     Brief Narrative:   Jessica Frazier is an 20 y.o. female past medical history significant for seronegative rheumatoid arthritis and hypothyroidism admitted in January for elevation in LFTs felt to be due to medication.  Comes in to [facility] on 06/02/2020 complains of abdominal pain and dark tarry stools with intermittent left upper quadrant and right lower quadrant pain.  In the ED lost her balance and hit her head CT scan of the head showed a subarachnoid hemorrhage with platelet count of 43 neurosurgery was consulted, patient was unable to complete FFP due to fevers repeated CT scan showed expanding subarachnoid hemorrhage Significant Events: 06/03/2000 admitted to the ICU for subarachnoid, subdural hemorrhage.   Significant studies: 06/03/2020 CT scan of the abdomen and pelvis showed fatty liver with hepatic steatosis small ascites. 05/14/2020 CT of the head new multifocal intracranial hemorrhage with expanding left scalp hematoma and new left-sided subdural hematoma, mild subarachnoid hemorrhage, with small midline shift. 06/04/2020 bone marrow biopsy.  Antibiotics: None  Microbiology data: Blood culture:  Procedures: None  Assessment/Plan:   Traumatic subarachnoid hemorrhage, subdural hematoma and ICH right frontal contusion: In the setting of coagulopathy and pancytopenia, especially thrombocytopenia. Neurosurgery was consulted who recommended no surgical intervention at this time.  Neurology recommended medical management with hematology, rescan only if neurologically she declines. Neurological exam yesterday seems to be stable. Delirium precaution minimize CNS depressant medication.  Hematology/coagulopathy pancytopenia: Etiology unknown differential complicated by poor oral intake, recent acute liver injury and recent upadacitinib and poorly  defined polyarthritis over past 2 years. Pancytopenia has now resolved.  In the setting of steroid use, which brings into question if she has had untreated suboptimal underlying autoimmune disease.  Also on the differential her cytopenias could be related to poor oral intake in the setting of starvation related to acute fatty liver. Hematology is on board and recommended to continue steroids and tapered over the next 2 weeks.,  Based on rheumatology plan.  In 1 week cannula fibrinogen stripped down to 150 without transfusions. Continue vitamin K p.o. daily for 2 weeks. 06/04/2020 bone marrow biopsy showed no blasts on smear final path is pending. Hematology on board appreciate assistance recommended vitamin K for 3 weeks. Continue to monitor PT/INR fibrinogen and CBC, will transfuse 1 platelets less than 50, INR less than 1.5 and fibrinogen less than 200. Hemoglobin is at 10, platelet count is 160, last PT/INR was 13.9 and 1.1  Fatty liver, chronic nausea vomiting for 2 months: Unclear etiology acute hepatitis last month was attributed to updacitinib.  GI concern about starvation. LFTs are now trending up.  We will repeat again.  Seronegative rheumatoid arthritis/polyarthritis: ANA was positive on 06/03/2020 2022 acute hepatitis panel was negative. LDH high at 341, factor 8 180, factor V assay 152, factor X 104, lupus anticoagulant unremarkable, anticardiolipin was positive IgM of 115 IgG 10 beta-2 glycoprotein proteins unremarkable. Of 98. Further work-up is pending  flow cytometry. She is currently on steroids 40 mg. Continue supportive care.  Hypothyroidism: We will need repeated TSH and free T4 as an outpatient there are abnormal now likely due sick euthyroid syndrome.  Protein caloric malnutrition severe: Core track in place continue nutritional supplements. Psych consulted for possible eating disorder.  Acute kidney injury: Continue to follow strict I's and O's, but not in the  nephrotic range no white blood cells or red blood  cells in urine.   DVT prophylaxis: SCD Family Communication:mother Status is: Inpatient  Remains inpatient appropriate because:Hemodynamically unstable   Dispo: The patient is from: Home              Anticipated d/c is to: Home              Patient currently is not medically stable to d/c.   Difficult to place patient No     Code Status:     Code Status Orders  (From admission, onward)         Start     Ordered   06/03/20 1017  Full code  Continuous        06/03/20 1021        Code Status History    Date Active Date Inactive Code Status Order ID Comments User Context   04/09/2020 1430 04/10/2020 1958 Full Code [id number]  Delora Fuel, MD ED   Advance Care Planning Activity        IV Access:    Peripheral IV   Procedures and diagnostic studies:   No results found.   Medical Consultants:    None.   Subjective:    Jessica Frazier she relates no new complaints except she wants a tube out of her nose. She agreed to eat.  Objective:    Vitals:   06/06/20 1947 06/06/20 2335 06/07/20 0312 06/07/20 0600  BP: 110/70 106/73 114/76   Pulse: 96 97 92   Resp: 18 (!) 22 (!) 26 (!) 24  Temp: 97.9 F (36.6 C) 97.7 F (36.5 C) 97.8 F (36.6 C)   TempSrc: Oral Oral Oral   SpO2: 99% 100% 100%   Weight:      Height:       SpO2: 100 %   Intake/Output Summary (Last 24 hours) at 06/07/2020 0700 Last data filed at 06/06/2020 2336 Gross per 24 hour  Intake 140.21 ml  Output 1050 ml  Net -909.79 ml   Filed Weights   06/03/20 1457 06/05/20 0500 06/06/20 0300  Weight: 53.6 kg 51.7 kg 55.6 kg    Exam: General exam: In no acute distress, cachectic appearing Respiratory system: Good air movement and clear to auscultation. Cardiovascular system: S1 & S2 heard, RRR. No JVD. Gastrointestinal system: Abdomen is nondistended, soft and nontender.  Extremities: Deformed wrist bilaterally Skin: No rashes,  lesions or ulcers Psychiatry: Judgment and insight appears intact.  Mood and affect appears depressed   Data Reviewed:    Labs: Basic Metabolic Panel: Recent Labs  Lab 06/04/20 0502 06/04/20 1706 06/05/20 0123 06/05/20 0415 06/05/20 0906 06/06/20 0321 06/06/20 1648 06/07/20 0458  NA 133*   < > 136 135 136 132*  --  136  K 4.3   < > 4.1 4.2 4.2 3.9  --  3.6  CL 106   < > 105 105 103 102  --  104  CO2 22   < > _0 --  25  GLUCOSE 109*   < > 114* 132* 136* 86  --  95  BUN <5*   < > _1 --  6  CREATININE 0.39*   < > 0.45 0.41* 0.42* 0.38*  --  0.41*  CALCIUM 7.2*   < > 7.9* 7.9* 8.0* 7.8*  --  7.9*  MG 1.9  --   --  2.1  --  2.3 2.0 2.0  PHOS 3.5  --   --  4.1  --  3.3 3.0 2.7   < > = values in this interval not displayed.   GFR Estimated Creatinine Clearance: 88.7 mL/min (A) (by C-G formula based on SCr of 0.41 mg/dL (L)). Liver Function Tests: Recent Labs  Lab 06/04/20 0502 06/04/20 2142 06/05/20 0415 06/06/20 0321 06/07/20 0458  AST 76* 60* 66* 81* 218*  ALT _0 82*  ALKPHOS 136* 122 127* 144* 193*  BILITOT 0.9 0.8 1.2 0.8 0.8  PROT 5.7* 6.5 6.5 6.7 6.5  ALBUMIN 1.5* 2.2* 2.1* 2.0* 1.9*   Recent Labs  Lab 06/02/20 1453 06/03/20 1147  LIPASE 42  --   AMYLASE  --  21*   No results for input(s): AMMONIA in the last 168 hours. Coagulation profile Recent Labs  Lab 06/04/20 2142 06/05/20 0415 06/05/20 1236 06/06/20 0321 06/07/20 0458  INR 1.1  1.1 1.0 1.1 1.1 1.1   COVID-19 Labs  Recent Labs    06/04/20 2142  DDIMER 3.68*  LDH 341*    Lab Results  Component Value Date   SARSCOV2NAA NEGATIVE 06/03/2020   [place] NEGATIVE 04/09/2020    CBC: Recent Labs  Lab 06/03/20 1101 06/03/20 1110 06/04/20 2142 06/05/20 0415 06/05/20 1236 06/06/20 0321 06/07/20 0458  WBC  --    < > 4.4  4.4 3.1* 3.6* 5.2 5.5  NEUTROABS 1.8  --  2.4  --   --   --   --   HGB  --    < > 7.8*  7.8* 8.0* 8.6* 9.2* 10.1*  HCT  --    < >  23.5*  24.1* 24.4* 25.5* 27.2* 29.2*  MCV  --    < > 91.4  93.1 92.4 91.1 91.3 92.1  PLT  --    < > 152  157 150 160 174 168   < > = values in this interval not displayed.   Cardiac Enzymes: Recent Labs  Lab 06/03/20 1101  CKTOTAL 29*   BNP (last 3 results) No results for input(s): PROBNP in the last 8760 hours. CBG: Recent Labs  Lab 06/06/20 1140 06/06/20 1551 06/06/20 1945 06/06/20 2338 06/07/20 0314  GLUCAP 107* 114* 138* 97 84   D-Dimer: Recent Labs    06/04/20 2142  DDIMER 3.68*   Hgb A1c: No results for input(s): HGBA1C in the last 72 hours. Lipid Profile: No results for input(s): CHOL, HDL, LDLCALC, TRIG, CHOLHDL, LDLDIRECT in the last 72 hours. Thyroid function studies: No results for input(s): TSH, T4TOTAL, T3FREE, THYROIDAB in the last 72 hours.  Invalid input(s): FREET3 Anemia work up: Recent Labs    06/06/20 1032  RETICCTPCT 3.3*   Sepsis Labs: Recent Labs  Lab 06/05/20 0415 06/05/20 1236 06/06/20 0321 06/07/20 0458  WBC 3.1* 3.6* 5.2 5.5   Microbiology Recent Results (from the past 240 hour(s))  SARS CORONAVIRUS 2 (TAT 6-24 HRS) Nasopharyngeal Nasopharyngeal Swab     Status: None   Collection Time: 06/03/20 12:22 AM   Specimen: Nasopharyngeal Swab  Result Value Ref Range Status   SARS Coronavirus 2 NEGATIVE NEGATIVE Final    Comment: (NOTE) SARS-CoV-2 target nucleic acids are NOT DETECTED.  The SARS-CoV-2 RNA is generally detectable in upper and lower respiratory specimens during the acute phase of infection. Negative results do not preclude SARS-CoV-2 infection, do not rule out co-infections with other pathogens, and should not be used as the sole basis for treatment or other patient management decisions. Negative results must be combined with clinical observations, patient history, and epidemiological information. The expected  result is Negative.  Fact Sheet for Patients: SugarRoll.be  Fact Sheet  for Healthcare Providers: https://www.woods-mathews.com/  This test is not yet approved or cleared by the [place] FDA and  has been authorized for detection and/or diagnosis of SARS-CoV-2 by FDA under an Emergency Use Authorization (EUA). This EUA will remain  in effect (meaning this test can be used) for the duration of the COVID-19 declaration under Se ction 564(b)(1) of the Act, 21 U.S.C. section 360bbb-3(b)(1), unless the authorization is terminated or revoked sooner.  Performed at [facility], [place] [address], [place], [place] [address]      Medications:   . B-complex with vitamin C  1 tablet Oral Daily  . Chlorhexidine Gluconate Cloth  6 each Topical Daily  . levothyroxine  75 mcg Oral QAC breakfast  . multivitamin with minerals  1 tablet Per Tube Daily  . pantoprazole  40 mg Oral QHS  . phytonadione  10 mg Oral Daily   Followed by  . [START ON 06/10/2020] phytonadione  5 mg Oral Daily  . predniSONE  40 mg Oral Q breakfast  . thiamine  100 mg Per Tube Daily   Continuous Infusions: . sodium chloride 10 mL/hr at 06/06/20 1600  . feeding supplement (OSMOLITE 1.2 CAL) 1,000 mL (06/06/20 1645)      LOS: 4 days   Charlynne Cousins  [facility]  06/07/2020, 7:00 AM

## 2020-06-07 NOTE — Evaluation (Addendum)
Physical Therapy Evaluation Patient Details Name: Jessica Frazier MRN: [id number] DOB: [birth year] Today's Date: 06/07/2020   History of Present Illness  20 y/o female presented to ED on 3/21 with chief complaint of abdominal pain and black stools. In ED, patient fell in bathroom and hit head. Head CT found multifocal acute subarachnoid hemmorhage and L parietal hematoma without skull fx. Repeat head CT showed L hypodense subdural hematoma with new midline shift. Evaluating for coagulopathy. PMH: hypothyroidism, RA  Clinical Impression  PTA, patient lives with mother and siblings and reports needing assistance from mother for ambulation and ADLs due to weakness. Patient requires totalA+2 for all aspects of bed mobility and attempts for sit to stand. Initially when asked to move LEs, patient states "I'm trying but I can't" but able to initiate moving LEs slightly towards EOB. Patient required totalA to maintain sitting balance EOB with no righting reaction for simulated posterior LOB, however with facial reaction. Patient with poor head control and will extend head and neck on command in sitting. Patient with inconsistencies throughout with movement of B UEs and LEs. Unsure if this is cognition related or lack of motivation. Demos difficulty processing and sequencing in addition to short term memory deficits. Patient presents with decreased B LE strength, decreased activity tolerance, impaired sitting balance, impaired functional mobility, impaired sensation, and impaired cognition. Patient will benefit from skilled PT services during acute stay to address listed deficits. Recommend SNF and 24/7 supervision/assistance following discharge, unless family is able to manage and provide the necessary supervision and assistance.     Follow Up Recommendations SNF;Supervision/Assistance - 24 hour    Equipment Recommendations  Wheelchair cushion (measurements PT);Wheelchair (measurements PT);Hospital bed (hoyer  lift)    Recommendations for Other Services       Precautions / Restrictions Precautions Precautions: Fall Precaution Comments: cortrak Restrictions Weight Bearing Restrictions: No      Mobility  Bed Mobility Overal bed mobility: Needs Assistance Bed Mobility: Supine to Sit;Sit to Supine;Rolling Rolling: Total assist;+2 for physical assistance;+2 for safety/equipment   Supine to sit: Max assist;Total assist;+2 for physical assistance;+2 for safety/equipment Sit to supine: Total assist;+2 for physical assistance;+2 for safety/equipment   General bed mobility comments: Patient initiated small movement of LEs towards EOB, however overall totalA+2 to sit EOB. TotalA+2 for return to supine and rolling for pad adjustment. Patient demos little initiation to assist with rolling. Unsure if due to fatigue or little motivation    Transfers Overall transfer level: Needs assistance Equipment used: 2 person hand held assist Transfers: Sit to/from Stand Sit to Stand: Total assist;+2 physical assistance;+2 safety/equipment         General transfer comment: totalA+2 to partially stand. Patient able to reach and grasp therapist elbow on first attempt but when asked to reach for second attempt to stand, patient states she is unable to.  Ambulation/Gait                Stairs            Wheelchair Mobility    Modified Rankin (Stroke Patients Only)       Balance Overall balance assessment: Needs assistance Sitting-balance support: No upper extremity supported;Feet supported Sitting balance-Leahy Scale: Zero Sitting balance - Comments: totalA to maintain sitting balance EOB. With simulated posterior LOB, patient with facial reaction but no physical righting reaction noted. Prior to simulation, patient stated she was able to catch herself if she fell back. When asked to lean on R elbow, noted R oblique activation but totalA  to lean and come back to midline Postural control:  Other (comment) (varying directions) Standing balance support: Bilateral upper extremity supported Standing balance-Leahy Scale: Zero Standing balance comment: totalA to partially stand                             Pertinent Vitals/Pain Pain Assessment: Faces Faces Pain Scale: Hurts even more Pain Location: generalized movement Pain Descriptors / Indicators: Grimacing;Moaning;Crying Pain Intervention(s): Monitored during session;Repositioned;Limited activity within patient's tolerance    Home Living Family/patient expects to be discharged to:: Private residence Living Arrangements: Parent;Other relatives (siblings)   Type of Home: House           Additional Comments: little information given by patient    Prior Function Level of Independence: Needs assistance   Gait / Transfers Assistance Needed: Patient states she gets her mom's help when she needs to get up and walk  ADL's / Homemaking Assistance Needed: Needs help with bathing and dressing due to weakness        Hand Dominance        Extremity/Trunk Assessment   Upper Extremity Assessment Upper Extremity Assessment: Defer to OT evaluation    Lower Extremity Assessment Lower Extremity Assessment: RLE deficits/detail;LLE deficits/detail RLE Deficits / Details: grossly 2-/5 RLE Sensation: decreased light touch;decreased proprioception RLE Coordination: decreased fine motor;decreased gross motor LLE Deficits / Details: grossly 2-/5 LLE Sensation: decreased light touch;decreased proprioception LLE Coordination: decreased fine motor;decreased gross motor    Cervical / Trunk Assessment Cervical / Trunk Assessment: Other exceptions Cervical / Trunk Exceptions: Difficulty maintaining head position in sitting due to fatigue but able to extend head on command  Communication   Communication: No difficulties  Cognition Arousal/Alertness: Awake/alert Behavior During Therapy: Flat affect Overall Cognitive  Status: Impaired/Different from baseline Area of Impairment: Memory;Following commands;Safety/judgement;Awareness;Problem solving                     Memory: Decreased short-term memory Following Commands: Follows one step commands inconsistently;Follows one step commands with increased time Safety/Judgement: Decreased awareness of safety;Decreased awareness of deficits Awareness: Emergent Problem Solving: Slow processing;Decreased initiation;Difficulty sequencing;Requires verbal cues;Requires tactile cues General Comments: Patient with little initiation to movement this session with constant multimodal cueing. After presenting to patient that we wanted to sit on R side of bed, when asked to move LEs patient asked which direction. Unaware of needing totalA to maintain sitting balance sitting EOB and states that she can hold herself up. Unsure if some of initiation difficulty is due to brain injury or low motivation due to inconsistencies throughout session      General Comments General comments (skin integrity, edema, etc.): Mother present. Mother speaks little [place] and daughter speaks [place]. Patient and mother with conversations during session, unable to understand due to no interpreter present    Exercises     Assessment/Plan    PT Assessment Patient needs continued PT services  PT Problem List Decreased strength;Decreased range of motion;Decreased activity tolerance;Decreased balance;Decreased mobility;Decreased coordination;Decreased cognition;Decreased knowledge of use of DME;Decreased safety awareness;Impaired sensation       PT Treatment Interventions DME instruction;Gait training;Functional mobility training;Therapeutic activities;Therapeutic exercise;Balance training;Stair training;Neuromuscular re-education;Patient/family education;Wheelchair mobility training    PT Goals (Current goals can be found in the Care Plan section)  Acute Rehab PT Goals Patient Stated  Goal: did not state PT Goal Formulation: With patient Time For Goal Achievement: 06/21/20 Potential to Achieve Goals: Fair    Frequency Min 3X/week  Barriers to discharge        Co-evaluation PT/OT/SLP Co-Evaluation/Treatment: Yes Reason for Co-Treatment: Necessary to address cognition/behavior during functional activity;For patient/therapist safety;To address functional/ADL transfers PT goals addressed during session: Mobility/safety with mobility;Balance         AM-PAC PT "6 Clicks" Mobility  Outcome Measure Help needed turning from your back to your side while in a flat bed without using bedrails?: Total Help needed moving from lying on your back to sitting on the side of a flat bed without using bedrails?: Total Help needed moving to and from a bed to a chair (including a wheelchair)?: Total Help needed standing up from a chair using your arms (e.g., wheelchair or bedside chair)?: Total Help needed to walk in hospital room?: Total Help needed climbing 3-5 steps with a railing? : Total 6 Click Score: 6    End of Session   Activity Tolerance: Patient limited by fatigue Patient left: in bed;with call bell/phone within reach;with bed alarm set;with family/visitor present Nurse Communication: Mobility status PT Visit Diagnosis: Unsteadiness on feet (R26.81);Other abnormalities of gait and mobility (R26.89);Muscle weakness (generalized) (M62.81);History of falling (Z91.81);Other symptoms and signs involving the nervous system (R29.898)    Time: 2694-8546 PT Time Calculation (min) (ACUTE ONLY): 46 min   Charges:   PT Evaluation $PT Eval Moderate Complexity: 1 Mod PT Treatments $Therapeutic Activity: 8-22 mins        Tyeesha Riker A. Gilford Rile PT, DPT Acute Rehabilitation Services Pager [phone] Office [phone]   Linna Hoff 06/07/2020, 1:19 PM

## 2020-06-07 NOTE — Plan of Care (Signed)

## 2020-06-07 NOTE — Evaluation (Signed)
Occupational Therapy Evaluation Patient Details Name: Jessica Frazier MRN: [id number] DOB: 2000-05-10 Today's Date: 06/07/2020    History of Present Illness 20 y/o female presented to ED on 3/21 with chief complaint of abdominal pain and black stools. In ED, patient fell in bathroom and hit head. Head CT found multifocal acute subarachnoid hemmorhage and L parietal hematoma without skull fx. Repeat head CT showed L hypodense subdural hematoma with new midline shift. Evaluating for coagulopathy. PMH: hypothyroidism, RA   Clinical Impression   Pt PTA: Pt reports requiring assist with ADL and mobility per pt and mother. Pt PTA: Pt severely limited by decreased strength, decreased activity tolerance, decreased ability to care for self and decreased cognition. Pt following 75% of commands with increased time; cues to open eyes and leave open upon arousal. Pt reports RA at baseline and has pain at all times. Pt difficulty assisting with any mobility today, posterior and L lateral lean;  inability to keep head up and totalA +2 for mobility with attempt to stand, but pt crying out in pain. Pt totalA for ADL. Pt's mother speaks very little [place]. Pt would benefit from continued OT skilled services for ADL, mobility and safety in post acute care setting with 24/7 physical assist. OT following acutely.    Follow Up Recommendations  SNF;Supervision/Assistance - 24 hour    Equipment Recommendations  3 in 1 bedside commode;Hospital bed;Wheelchair (measurements OT);Wheelchair cushion (measurements OT)    Recommendations for Other Services       Precautions / Restrictions Precautions Precautions: Fall Precaution Comments: cortrak Restrictions Weight Bearing Restrictions: No      Mobility Bed Mobility Overal bed mobility: Needs Assistance Bed Mobility: Supine to Sit;Sit to Supine;Rolling Rolling: Total assist;+2 for physical assistance;+2 for safety/equipment   Supine to sit: Max assist;Total  assist;+2 for physical assistance;+2 for safety/equipment Sit to supine: Total assist;+2 for physical assistance;+2 for safety/equipment   General bed mobility comments: Patient initiated small movement of LEs towards EOB, however overall totalA+2 to sit EOB. TotalA+2 for return to supine and rolling for pad adjustment. Patient demos little initiation to assist with rolling. Unsure if pt is truly unable to assist or lack of motivation or due to head trauma.    Transfers Overall transfer level: Needs assistance Equipment used: 2 person hand held assist Transfers: Sit to/from Stand Sit to Stand: Total assist;+2 physical assistance;+2 safety/equipment         General transfer comment: totalA+2 to partially stand. Patient able to reach and grasp therapist elbow on first attempt but when asked to reach for second attempt to stand, patient states she is unable to.    Balance Overall balance assessment: Needs assistance Sitting-balance support: No upper extremity supported;Feet supported Sitting balance-Leahy Scale: Zero Sitting balance - Comments: totalA to maintain sitting balance EOB. With simulated posterior LOB, patient with facial reaction but no physical righting reaction noted. Prior to simulation, patient stated she was able to catch herself if she fell back. When asked to lean on R elbow, noted R oblique activation but totalA to lean and come back to midline Postural control: Other (comment) (varying directions) Standing balance support: Bilateral upper extremity supported Standing balance-Leahy Scale: Zero Standing balance comment: totalA to partially stand                           ADL either performed or assessed with clinical judgement   ADL Overall ADL's : Needs assistance/impaired Eating/Feeding: Total assistance   Grooming: Total  assistance;Bed level   Upper Body Bathing: Total assistance;Bed level   Lower Body Bathing: Total assistance;Bed level   Upper  Body Dressing : Total assistance;Bed level   Lower Body Dressing: Total assistance;Bed level   Toilet Transfer: Total assistance;+2 for safety/equipment;+2 for physical assistance Toilet Transfer Details (indicate cue type and reason): use lift Toileting- Clothing Manipulation and Hygiene: Total assistance Toileting - Clothing Manipulation Details (indicate cue type and reason): peeing on side of bed, aware, but did not make sure that purewick was in place     Functional mobility during ADLs: Total assistance;+2 for physical assistance;+2 for safety/equipment;Cueing for safety;Cueing for sequencing General ADL Comments: Pt severely limited by decreased strength, decreased activity tolerance, decreased ability to care for self and decreased cognition. Pt following 75% of commands with increased time; cues to open eyes and leave open upon arousal. Pt unable to grip a utensil to feed self.     Vision Baseline Vision/History: Wears glasses Wears Glasses: At all times Patient Visual Report: Other (comment) (pt refused to wear her glasses stating "headache.") Vision Assessment?: Vision impaired- to be further tested in functional context Additional Comments: Able to read digital clock on wall, but everything is blurry, when presented with her glasses, pt stating "headache"     Perception     Praxis      Pertinent Vitals/Pain Pain Assessment: Faces Faces Pain Scale: Hurts even more Pain Location: generalized movement Pain Descriptors / Indicators: Grimacing;Moaning;Crying Pain Intervention(s): Monitored during session;Repositioned;Limited activity within patient's tolerance     Hand Dominance Right   Extremity/Trunk Assessment Upper Extremity Assessment Upper Extremity Assessment: Generalized weakness;RUE deficits/detail;LUE deficits/detail RUE Deficits / Details: 2-/5 MM grade; poor grip; very little AROM noted RUE: Unable to fully assess due to pain RUE Sensation: decreased light  touch RUE Coordination: decreased fine motor;decreased gross motor LUE Deficits / Details: 2/5 MM grade; poor grip; very little AROM noted LUE: Unable to fully assess due to pain LUE Sensation: decreased light touch LUE Coordination: decreased fine motor;decreased gross motor   Lower Extremity Assessment Lower Extremity Assessment: Generalized weakness RLE Deficits / Details: decreased AROM, very weak RLE Sensation: decreased light touch;decreased proprioception RLE Coordination: decreased fine motor;decreased gross motor LLE Deficits / Details: decreased AROM, very weak LLE Sensation: decreased light touch;decreased proprioception LLE Coordination: decreased fine motor;decreased gross motor   Cervical / Trunk Assessment Cervical / Trunk Assessment: Other exceptions Cervical / Trunk Exceptions: Difficulty maintaining head position in sitting due to fatigue, but able to extend head on command   Communication Communication Communication: No difficulties   Cognition Arousal/Alertness: Awake/alert Behavior During Therapy: Flat affect Overall Cognitive Status: Impaired/Different from baseline Area of Impairment: Memory;Following commands;Safety/judgement;Awareness;Problem solving                     Memory: Decreased short-term memory Following Commands: Follows one step commands inconsistently;Follows one step commands with increased time Safety/Judgement: Decreased awareness of safety;Decreased awareness of deficits Awareness: Emergent Problem Solving: Slow processing;Decreased initiation;Difficulty sequencing;Requires verbal cues;Requires tactile cues General Comments: Pt requires increased time to respond to questions and increased time to perform tasks. pt stating "you guys aren't being patient with me. I have RA." Pt encouraged to eat and pt stating "I will have all fo the jello." Attempts to educate health alternatives to jello, but pt continued to want jello. Pt speaks  and comprehends [place] and [place]. Unknowingly, pt's mother does not speak [place].   General Comments  Mother present. use interpreter as her mother speaks  only Spanish with very littel English    Exercises     Shoulder Instructions      Home Living Family/patient expects to be discharged to:: Private residence Living Arrangements: Parent;Other relatives (siblings)   Type of Home: House                           Additional Comments: little information given by patient      Prior Functioning/Environment Level of Independence: Needs assistance  Gait / Transfers Assistance Needed: Patient states she gets her mom's help when she needs to get up and walk ADL's / Homemaking Assistance Needed: Needs help with bathing and dressing due to weakness            OT Problem List: Decreased strength;Decreased range of motion;Decreased activity tolerance;Impaired balance (sitting and/or standing);Decreased cognition;Decreased coordination;Impaired vision/perception;Decreased safety awareness;Pain;Increased edema;Cardiopulmonary status limiting activity;Impaired UE functional use      OT Treatment/Interventions: Self-care/ADL training;Therapeutic exercise;Neuromuscular education;Energy conservation;DME and/or AE instruction;Therapeutic activities;Cognitive remediation/compensation;Visual/perceptual remediation/compensation;Patient/family education;Balance training    OT Goals(Current goals can be found in the care plan section) Acute Rehab OT Goals Patient Stated Goal: did not state OT Goal Formulation: With patient Time For Goal Achievement: 05/21/20 Potential to Achieve Goals: Good ADL Goals Pt Will Perform Eating: with set-up;with adaptive utensils;sitting;bed level Pt Will Perform Grooming: with set-up;with adaptive equipment;sitting;bed level Pt Will Transfer to Toilet: with max assist;squat pivot transfer;bedside commode Pt/caregiver will Perform Home Exercise Program:  Increased strength;Both right and left upper extremity;With Supervision;With written HEP provided Additional ADL Goal #1: Pt will perform bed mobility to EOB with minA overall with few cues to attend to task.  OT Frequency: Min 2X/week   Barriers to D/C: Decreased caregiver support  unsure of physical assist at home       Co-evaluation PT/OT/SLP Co-Evaluation/Treatment: Yes Reason for Co-Treatment: Complexity of the patient's impairments (multi-system involvement);To address functional/ADL transfers;For patient/therapist safety;Necessary to address cognition/behavior during functional activity PT goals addressed during session: Mobility/safety with mobility;Balance OT goals addressed during session: ADL's and self-care      AM-PAC OT "6 Clicks" Daily Activity     Outcome Measure Help from another person eating meals?: Total Help from another person taking care of personal grooming?: Total Help from another person toileting, which includes using toliet, bedpan, or urinal?: Total Help from another person bathing (including washing, rinsing, drying)?: Total Help from another person to put on and taking off regular upper body clothing?: Total Help from another person to put on and taking off regular lower body clothing?: Total 6 Click Score: 6   End of Session Nurse Communication: Mobility status  Activity Tolerance: Patient limited by pain;Patient limited by fatigue Patient left: in bed;with call bell/phone within reach;with bed alarm set  OT Visit Diagnosis: Unsteadiness on feet (R26.81);Muscle weakness (generalized) (M62.81);Pain;Other symptoms and signs involving cognitive function;Hemiplegia and hemiparesis Hemiplegia - Right/Left: Right Hemiplegia - dominant/non-dominant: Dominant Hemiplegia - caused by: Unspecified Pain - Right/Left: Left Pain - part of body:  (neck, chest)                Time: 1103-1150 OT Time Calculation (min): 47 min Charges:  OT General Charges $OT  Visit: 1 Visit OT Evaluation $OT Eval Moderate Complexity: 1 Mod  Jefferey Pica, OTR/L Acute Rehabilitation Services Pager: [phone] Office: [phone]   Adrieanna Boteler C 06/07/2020, 2:49 PM

## 2020-06-07 NOTE — Progress Notes (Signed)
Subjective: Patient reports that she is doing well overall. She does have complaints of neck pain. No acute events overnight.  Objective: Vital signs in last 24 hours: Temp:  [97.7 F (36.5 C)-98.5 F (36.9 C)] 98.5 F (36.9 C) (03/26 0737) Pulse Rate:  [92-100] 100 (03/26 0737) Resp:  [16-29] 20 (03/26 0809) BP: (106-122)/(70-99) 117/77 (03/26 0737) SpO2:  [98 %-100 %] 98 % (03/26 0737)  Intake/Output from previous day: 03/25 0701 - 03/26 0700 In: 140.2 [P.O.:60; I.V.:74; NG/GT:6.3] Out: 1050 [Urine:1050] Intake/Output this shift: Total I/O In: 30 [NG/GT:30] Out: -   Physical Exam: Patient is awake, A/O X 4, and conversant. She is following commands. Speech is fluent and appropriate. MAEW with generalized weakness. PERRLA, EOMI. BLE 2+ pitting edema   Lab Results: Recent Labs    06/06/20 0321 06/07/20 0458  WBC 5.2 5.5  HGB 9.2* 10.1*  HCT 27.2* 29.2*  PLT 174 168   BMET Recent Labs    06/06/20 0321 06/07/20 0458  NA 132* 136  K 3.9 3.6  CL 102 104  CO2 24 25  GLUCOSE 86 95  BUN 11 6  CREATININE 0.38* 0.41*  CALCIUM 7.8* 7.9*    Studies/Results: No results found.  Assessment/Plan: Patient is stable from a neurosurgical perspective. She does have complaints of neck pain that is well controlled with PO analgesics. No new neurosurgical recommendations at this time.   -Transfusions and medical management per Critical Care Medicine and hematology/oncology.    LOS: 4 days     Marvis Moeller, DNP, NP-C  06/07/2020, 8:37 AM

## 2020-06-08 ENCOUNTER — Inpatient Hospital Stay (HOSPITAL_COMMUNITY): Payer: 59

## 2020-06-08 DIAGNOSIS — I609 Nontraumatic subarachnoid hemorrhage, unspecified: Secondary | ICD-10-CM | POA: Diagnosis not present

## 2020-06-08 LAB — CBC
HCT: 28.6 % — ABNORMAL LOW (ref 36.0–46.0)
Hemoglobin: 9.3 g/dL — ABNORMAL LOW (ref 12.0–15.0)
MCH: 30.6 pg (ref 26.0–34.0)
MCHC: 32.5 g/dL (ref 30.0–36.0)
MCV: 94.1 fL (ref 80.0–100.0)
Platelets: 130 10*3/uL — ABNORMAL LOW (ref 150–400)
RBC: 3.04 MIL/uL — ABNORMAL LOW (ref 3.87–5.11)
RDW: 18 % — ABNORMAL HIGH (ref 11.5–15.5)
WBC: 5.6 10*3/uL (ref 4.0–10.5)
nRBC: 0 % (ref 0.0–0.2)

## 2020-06-08 LAB — COMPREHENSIVE METABOLIC PANEL
ALT: 122 U/L — ABNORMAL HIGH (ref 0–44)
AST: 280 U/L — ABNORMAL HIGH (ref 15–41)
Albumin: 1.9 g/dL — ABNORMAL LOW (ref 3.5–5.0)
Alkaline Phosphatase: 188 U/L — ABNORMAL HIGH (ref 38–126)
Anion gap: 6 (ref 5–15)
BUN: 5 mg/dL — ABNORMAL LOW (ref 6–20)
CO2: 23 mmol/L (ref 22–32)
Calcium: 7.8 mg/dL — ABNORMAL LOW (ref 8.9–10.3)
Chloride: 107 mmol/L (ref 98–111)
Creatinine, Ser: 0.4 mg/dL — ABNORMAL LOW (ref 0.44–1.00)
GFR, Estimated: >60 mL/min (ref >60)
Glucose, Bld: 101 mg/dL — ABNORMAL HIGH (ref 70–99)
Potassium: 3.5 mmol/L (ref 3.5–5.1)
Sodium: 136 mmol/L (ref 135–145)
Total Bilirubin: 0.9 mg/dL (ref 0.3–1.2)
Total Protein: 6.5 g/dL (ref 6.5–8.1)

## 2020-06-08 LAB — PROTIME-INR
INR: 1.2 (ref 0.8–1.2)
Prothrombin Time: 14.7 seconds (ref 11.4–15.2)

## 2020-06-08 LAB — IRON AND TIBC
Iron: 28 ug/dL (ref 28–170)
Saturation Ratios: 15 % (ref 10.4–31.8)
TIBC: 192 ug/dL — ABNORMAL LOW (ref 250–450)
UIBC: 164 ug/dL

## 2020-06-08 LAB — MAGNESIUM: Magnesium: 1.9 mg/dL (ref 1.7–2.4)

## 2020-06-08 LAB — GLUCOSE, CAPILLARY
Glucose-Capillary: 107 mg/dL — ABNORMAL HIGH (ref 70–99)
Glucose-Capillary: 108 mg/dL — ABNORMAL HIGH (ref 70–99)
Glucose-Capillary: 141 mg/dL — ABNORMAL HIGH (ref 70–99)
Glucose-Capillary: 78 mg/dL (ref 70–99)
Glucose-Capillary: 88 mg/dL (ref 70–99)
Glucose-Capillary: 95 mg/dL (ref 70–99)
Glucose-Capillary: 97 mg/dL (ref 70–99)

## 2020-06-08 LAB — PHOSPHORUS: Phosphorus: 2.5 mg/dL (ref 2.5–4.6)

## 2020-06-08 LAB — APTT: aPTT: 37 seconds — ABNORMAL HIGH (ref 24–36)

## 2020-06-08 LAB — FERRITIN: Ferritin: 541 ng/mL — ABNORMAL HIGH (ref 11–307)

## 2020-06-08 MED ORDER — WHITE PETROLATUM EX OINT
TOPICAL_OINTMENT | CUTANEOUS | Status: AC
  Administered 2020-06-08: 0.2
  Filled 2020-06-08: qty 28.35

## 2020-06-08 MED ORDER — DEXTROSE-NACL 5-0.45 % IV SOLN: INTRAVENOUS | Status: AC

## 2020-06-08 MED ORDER — FLUOXETINE HCL 10 MG PO CAPS
10.0000 mg | ORAL_CAPSULE | Freq: Every day | ORAL | Status: DC
  Administered 2020-06-08 – 2020-06-09 (×2): 10 mg via ORAL
  Filled 2020-06-08 (×3): qty 1

## 2020-06-08 NOTE — Plan of Care (Signed)

## 2020-06-08 NOTE — Progress Notes (Signed)
Per handoff report, "V.O. to turn off pt's tube feed". Pt currently w/o TF setup but cortrak still in place. Dr. Myna Hidalgo updated and this RN requested advice pertaining to keeping TF off this shift d/t lack of documentation and orders still being in place.

## 2020-06-08 NOTE — Progress Notes (Signed)
Subjective: Patient reports that she is exhausted. No acute events reported overnight.   Objective: Vital signs in last 24 hours: Temp:  [97.8 F (36.6 C)-101.1 F (38.4 C)] 98 F (36.7 C) (03/27 0836) Pulse Rate:  [87-115] 115 (03/27 0836) Resp:  [14-23] 14 (03/27 0836) BP: (112-117)/(60-82) 114/73 (03/27 0836) SpO2:  [94 %-100 %] 100 % (03/27 0836) Weight:  [50.7 kg] 50.7 kg (03/27 0534)  Intake/Output from previous day: 03/26 0701 - 03/27 0700 In: 1408 [I.V.:1011; NG/GT:397] Out: 400 [Urine:400] Intake/Output this shift: No intake/output data recorded.  Physical Exam: Patient is awake, A/O X 4, and conversant. She is following commands. Speech is fluent and appropriate. MAEW with generalized weakness. PERRLA, EOMI. BLE 2+ pitting edema   Lab Results: Recent Labs    06/07/20 0458 06/08/20 0146  WBC 5.5 5.6  HGB 10.1* 9.3*  HCT 29.2* 28.6*  PLT 168 130*   BMET Recent Labs    06/07/20 0754 06/08/20 0146  NA 137 136  K 3.6 3.5  CL 105 107  CO2 27 23  GLUCOSE 96 101*  BUN 5* 5*  CREATININE 0.45 0.40*  CALCIUM 7.9* 7.8*    Studies/Results: No results found.  Assessment/Plan: Patient continues to be stable from a neurosurgical perspective. No new neurosurgical recommendations at this time. Continue supportive care.   LOS: 5 days     Marvis Moeller, DNP, NP-C 06/08/2020, 8:38 AM

## 2020-06-08 NOTE — Progress Notes (Addendum)
[facility] PROGRESS NOTE    Progress Note  Jessica Frazier  LKT:[id number] DOB: 2000-08-18 DOA: 06/02/2020 PCP: Pcp, No     Brief Narrative:   Jessica Frazier is an 20 y.o. female past medical history significant for seronegative rheumatoid arthritis and hypothyroidism admitted in January for elevation in LFTs felt to be due to medication.  Comes in to [facility] on 06/02/2020 complains of abdominal pain and dark tarry stools with intermittent left upper quadrant and right lower quadrant pain.  In the ED lost her balance and hit her head CT scan of the head showed a subarachnoid hemorrhage with platelet count of 43 neurosurgery was consulted, patient was unable to complete FFP due to fevers repeated CT scan showed expanding subarachnoid hemorrhage Significant Events: 06/03/2000 admitted to the ICU for subarachnoid, subdural hemorrhage.   Significant studies: 06/03/2020 CT scan of the abdomen and pelvis showed fatty liver with hepatic steatosis small ascites. 05/14/2020 CT of the head new multifocal intracranial hemorrhage with expanding left scalp hematoma and new left-sided subdural hematoma, mild subarachnoid hemorrhage, with small midline shift. 06/04/2020 bone marrow biopsy.  Antibiotics: None  Microbiology data: Blood culture:  Procedures: None  Assessment/Plan:   Traumatic subarachnoid hemorrhage, subdural hematoma and ICH right frontal contusion: In the setting of coagulopathy and pancytopenia, especially thrombocytopenia. Neurosurgery was consulted who recommended no surgical intervention at this time.  Neurology recommended medical management with hematology, rescan only if neurologically she declines. Neurological exam yesterday seems to be stable. Delirium precaution minimize CNS depressant medication.  Hematology/coagulopathy pancytopenia: Etiology unknown differential complicated by poor oral intake, recent acute liver injury and recent upadacitinib and poorly  defined polyarthritis over past 2 years. Pancytopenia has now resolved.  In the setting of steroid use, which brings into question if she has had untreated suboptimal underlying autoimmune disease.  Also on the differential her cytopenias could be related to poor oral intake in the setting of starvation related to acute fatty liver. Hematology is on board and recommended to continue steroids and tapered over the next 2 weeks.,  Based on rheumatology plan.  In 1 week cannula fibrinogen stripped down to 150 without transfusions. Continue vitamin K p.o. daily for 2 weeks. 06/04/2020 bone marrow biopsy showed no blasts on smear final path is pending. Hematology on board appreciate assistance recommended vitamin K for 3 weeks. Continue to monitor PT/INR fibrinogen and CBC, will transfuse 1 platelets less than 50, INR less than 1.5 and fibrinogen less than 200. Hemoglobin is at 10, platelet count is 160, last PT/INR was 13.9 and 1.1  Fatty liver, chronic nausea vomiting for 2 months: Unclear etiology acute hepatitis last month was attributed to updacitinib.  GI concern about starvation. LFTs are now trending up.  LFTs are trending up, will get GI involved.  Seronegative rheumatoid arthritis/polyarthritis: ANA was positive on 06/03/2020 2022 acute hepatitis panel was negative. LDH high at 341, factor 8 180, factor V assay 152, factor X 104, lupus anticoagulant unremarkable, anticardiolipin was positive IgM of 115 IgG 10 beta-2 glycoprotein proteins unremarkable. Of 98. Further work-up is pending  flow cytometry. She is currently on steroids 40 mg. Continue supportive care.  Hypothyroidism: We will need repeated TSH and free T4 as an outpatient there are abnormal now likely due sick euthyroid syndrome.  Protein caloric malnutrition severe: Core track in place continue nutritional supplements. Psychiatry was consulted and they related that she has no eating disorder.  Acute kidney injury: Continue  to follow strict I's and O's, but not in  the nephrotic range no white blood cells or red blood cells in urine.  Depression: In speaking to the mother her motivation and appetite have significant degrees we will go ahead and start her on Prozac.  DVT prophylaxis: SCD Family Communication:mother Status is: Inpatient  Remains inpatient appropriate because:Hemodynamically unstable   Dispo: The patient is from: Home              Anticipated d/c is to: Home              Patient currently is not medically stable to d/c.   Difficult to place patient No     Code Status:     Code Status Orders  (From admission, onward)         Start     Ordered   06/03/20 1017  Full code  Continuous        06/03/20 1021        Code Status History    Date Active Date Inactive Code Status Order ID Comments User Context   04/09/2020 1430 04/10/2020 1958 Full Code [id number]  Delora Fuel, MD ED   Advance Care Planning Activity        IV Access:    Peripheral IV   Procedures and diagnostic studies:   No results found.   Medical Consultants:    None.   Subjective:    Jessica Frazier is not tolerating her diet.  Objective:    Vitals:   06/08/20 0142 06/08/20 0336 06/08/20 0534 06/08/20 0836  BP: 117/60 114/66  114/73  Pulse: (!) 110 (!) 108  (!) 115  Resp: 18 18  14   Temp: 99.3 F (37.4 C) 98.3 F (36.8 C)  98 F (36.7 C)  TempSrc: Oral Oral    SpO2: 95% 98%  100%  Weight:   50.7 kg   Height:       SpO2: 100 %   Intake/Output Summary (Last 24 hours) at 06/08/2020 0840 Last data filed at 06/08/2020 0537 Gross per 24 hour  Intake 1378.03 ml  Output 400 ml  Net 978.03 ml   Filed Weights   06/05/20 0500 06/06/20 0300 06/08/20 0534  Weight: 51.7 kg 55.6 kg 50.7 kg    Exam: General exam: In no acute distress cachectic Respiratory system: Good air movement and clear to auscultation. Cardiovascular system: S1 & S2 heard, RRR. No JVD. Gastrointestinal system:  Abdomen is nondistended, soft and nontender.  Extremities: No pedal edema. Skin: No rashes, lesions or ulcers Psychiatry: Judgement and insight appear normal. Mood & affect appropriate.   Data Reviewed:    Labs: Basic Metabolic Panel: Recent Labs  Lab 06/05/20 0906 06/06/20 0321 06/06/20 1648 06/07/20 0458 06/07/20 0754 06/07/20 1649 06/08/20 0146  NA 136 132*  --  136 137  --  136  K 4.2 3.9  --  3.6 3.6  --  3.5  CL 103 102  --  104 105  --  107  CO2 25 24  --  25 27  --  23  GLUCOSE 136* 86  --  95 96  --  101*  BUN 6 11  --  6 5*  --  5*  CREATININE 0.42* 0.38*  --  0.41* 0.45  --  0.40*  CALCIUM 8.0* 7.8*  --  7.9* 7.9*  --  7.8*  MG  --  2.3 2.0 2.0  --  2.0 1.9  PHOS  --  3.3 3.0 2.7  --  2.8 2.5   GFR Estimated  Creatinine Clearance: 88.7 mL/min (A) (by C-G formula based on SCr of 0.4 mg/dL (L)). Liver Function Tests: Recent Labs  Lab 06/05/20 0415 06/06/20 0321 06/07/20 0458 06/07/20 0754 06/08/20 0146  AST 66* 81* 218* 237* 280*  ALT 28 29 82* 87* 122*  ALKPHOS 127* 144* 193* 183* 188*  BILITOT 1.2 0.8 0.8 0.8 0.9  PROT 6.5 6.7 6.5 6.7 6.5  ALBUMIN 2.1* 2.0* 1.9* 1.9* 1.9*   Recent Labs  Lab 06/02/20 1453 06/03/20 1147  LIPASE 42  --   AMYLASE  --  21*   No results for input(s): AMMONIA in the last 168 hours. Coagulation profile Recent Labs  Lab 06/05/20 0415 06/05/20 1236 06/06/20 0321 06/07/20 0458 06/08/20 0146  INR 1.0 1.1 1.1 1.1 1.2   COVID-19 Labs  No results for input(s): DDIMER, FERRITIN, LDH, CRP in the last 72 hours.  Lab Results  Component Value Date   [place] NEGATIVE 06/03/2020   [place] NEGATIVE 04/09/2020    CBC: Recent Labs  Lab 06/03/20 1101 06/03/20 1110 06/04/20 2142 06/05/20 0415 06/05/20 1236 06/06/20 0321 06/07/20 0458 06/08/20 0146  WBC  --    < > 4.4  4.4 3.1* 3.6* 5.2 5.5 5.6  NEUTROABS 1.8  --  2.4  --   --   --   --   --   HGB  --    < > 7.8*  7.8* 8.0* 8.6* 9.2* 10.1* 9.3*  HCT   --    < > 23.5*  24.1* 24.4* 25.5* 27.2* 29.2* 28.6*  MCV  --    < > 91.4  93.1 92.4 91.1 91.3 92.1 94.1  PLT  --    < > 152  157 150 160 174 168 130*   < > = values in this interval not displayed.   Cardiac Enzymes: Recent Labs  Lab 06/03/20 1101 06/07/20 0754  CKTOTAL 29* 20*   BNP (last 3 results) No results for input(s): PROBNP in the last 8760 hours. CBG: Recent Labs  Lab 06/07/20 1633 06/07/20 2031 06/08/20 0046 06/08/20 0355 06/08/20 0833  GLUCAP 120* 134* 88 95 141*   D-Dimer: No results for input(s): DDIMER in the last 72 hours. Hgb A1c: No results for input(s): HGBA1C in the last 72 hours. Lipid Profile: No results for input(s): CHOL, HDL, LDLCALC, TRIG, CHOLHDL, LDLDIRECT in the last 72 hours. Thyroid function studies: No results for input(s): TSH, T4TOTAL, T3FREE, THYROIDAB in the last 72 hours.  Invalid input(s): FREET3 Anemia work up: Recent Labs    06/06/20 1032  RETICCTPCT 3.3*   Sepsis Labs: Recent Labs  Lab 06/05/20 1236 06/06/20 0321 06/07/20 0458 06/08/20 0146  WBC 3.6* 5.2 5.5 5.6   Microbiology Recent Results (from the past 240 hour(s))  SARS CORONAVIRUS 2 (TAT 6-24 HRS) Nasopharyngeal Nasopharyngeal Swab     Status: None   Collection Time: 06/03/20 12:22 AM   Specimen: Nasopharyngeal Swab  Result Value Ref Range Status   SARS Coronavirus 2 NEGATIVE NEGATIVE Final    Comment: (NOTE) SARS-CoV-2 target nucleic acids are NOT DETECTED.  The SARS-CoV-2 RNA is generally detectable in upper and lower respiratory specimens during the acute phase of infection. Negative results do not preclude SARS-CoV-2 infection, do not rule out co-infections with other pathogens, and should not be used as the sole basis for treatment or other patient management decisions. Negative results must be combined with clinical observations, patient history, and epidemiological information. The expected result is Negative.  Fact Sheet for  Patients: SugarRoll.be  Fact Sheet for Healthcare Providers: https://www.woods-mathews.com/  This test is not yet approved or cleared by the [place] FDA and  has been authorized for detection and/or diagnosis of SARS-CoV-2 by FDA under an Emergency Use Authorization (EUA). This EUA will remain  in effect (meaning this test can be used) for the duration of the COVID-19 declaration under Se ction 564(b)(1) of the Act, 21 U.S.C. section 360bbb-3(b)(1), unless the authorization is terminated or revoked sooner.  Performed at [facility], [place] [address], [place], [place] [address]      Medications:   . B-complex with vitamin C  1 tablet Oral Daily  . Chlorhexidine Gluconate Cloth  6 each Topical Daily  . cholecalciferol  1,000 Units Oral Daily  . levothyroxine  75 mcg Oral QAC breakfast  . multivitamin with minerals  1 tablet Oral Daily  . pantoprazole  40 mg Oral QHS  . phytonadione  10 mg Oral Daily   Followed by  . [START ON 06/10/2020] phytonadione  5 mg Oral Daily  . predniSONE  40 mg Oral Q breakfast  . thiamine  100 mg Oral Daily  . white petrolatum       Continuous Infusions: . sodium chloride 10 mL/hr at 06/06/20 1600  . feeding supplement (OSMOLITE 1.2 CAL) 1,000 mL (06/07/20 2303)      LOS: 5 days   Charlynne Cousins  [facility]  06/08/2020, 8:40 AM

## 2020-06-08 NOTE — Progress Notes (Signed)
Pt's CBG 78, Dr. Myna Hidalgo updated.

## 2020-06-08 NOTE — Progress Notes (Signed)
Subjective: The patient reports feeling "poody".  Objective: Vital signs in last 24 hours: Temp:  [97.8 F (36.6 C)-101.1 F (38.4 C)] 99.7 F (37.6 C) (03/27 1028) Pulse Rate:  [87-115] 102 (03/27 1028) Resp:  [14-19] 18 (03/27 1028) BP: (112-117)/(60-82) 112/75 (03/27 1028) SpO2:  [94 %-100 %] 100 % (03/27 1028) Weight:  [50.7 kg] 50.7 kg (03/27 0534) Last BM Date: 06/08/20  Intake/Output from previous day: 03/26 0701 - 03/27 0700 In: 1408 [I.V.:1011; NG/GT:397] Out: 400 [Urine:400] Intake/Output this shift: No intake/output data recorded.  General appearance: fatigued, ill-appearing, soft spoken Resp: clear to auscultation bilaterally Cardio: regular rate and rhythm GI: some tenderness in the RUQ, liver margin 2-3 cm below the right costal margin Extremities: extremities normal, atraumatic, no cyanosis or edema  Lab Results: Recent Labs    06/06/20 0321 06/07/20 0458 06/08/20 0146  WBC 5.2 5.5 5.6  HGB 9.2* 10.1* 9.3*  HCT 27.2* 29.2* 28.6*  PLT 174 168 130*   BMET Recent Labs    06/07/20 0458 06/07/20 0754 06/08/20 0146  NA 136 137 136  K 3.6 3.6 3.5  CL 104 105 107  CO2 25 27 23   GLUCOSE 95 96 101*  BUN 6 5* 5*  CREATININE 0.41* 0.45 0.40*  CALCIUM 7.9* 7.9* 7.8*   LFT Recent Labs    06/08/20 0146  PROT 6.5  ALBUMIN 1.9*  AST 280*  ALT 122*  ALKPHOS 188*  BILITOT 0.9   PT/INR Recent Labs    06/07/20 0458 06/08/20 0146  LABPROT 13.9 14.7  INR 1.1 1.2   Hepatitis Panel No results for input(s): HEPBSAG, HCVAB, HEPAIGM, HEPBIGM in the last 72 hours. C-Diff No results for input(s): CDIFFTOX in the last 72 hours. Fecal Lactopherrin No results for input(s): FECLLACTOFRN in the last 72 hours.  Studies/Results: No results found.  Medications:  Scheduled: . B-complex with vitamin C  1 tablet Oral Daily  . Chlorhexidine Gluconate Cloth  6 each Topical Daily  . cholecalciferol  1,000 Units Oral Daily  . FLUoxetine  10 mg Oral Daily  .  levothyroxine  75 mcg Oral QAC breakfast  . multivitamin with minerals  1 tablet Oral Daily  . pantoprazole  40 mg Oral QHS  . phytonadione  10 mg Oral Daily   Followed by  . [START ON 06/10/2020] phytonadione  5 mg Oral Daily  . predniSONE  40 mg Oral Q breakfast  . thiamine  100 mg Oral Daily   Continuous: . sodium chloride 10 mL/hr at 06/06/20 1600  . feeding supplement (OSMOLITE 1.2 CAL) 1,000 mL (06/07/20 2303)    Assessment/Plan: 1) Abnormal liver enzymes. 2) RA. 3) SAH. 4) Weight loss.   The patient was initially evaluated for her abnormal liver enzymes and some complaints of diarrhea and abdominal pain.  The diarrhea resolved, but her liver enzymes have increased.  They were trending downwards significantly and it was anticipated that they values would normalize, however, they started to increase.  The initial thought was that her liver enzymes were elevated as a result of Renvoq, but there was always the possibility of an autoimmune hepatitis (AIH).  AIH can present with significant steatosis, but the down trending of the liver enzymes were not matching AIH without any therapy.  From the hematologic standpoint she appears to be improving on prednisone 40 mg QD, but this treatment is also used for AIH.  Her liver enzymes continue to increase on prednisone.  This is a complex case and it is anticipated that  a liver biopsy will ultimately be required.  She will also be better suited for further evaluation and management at a tertiary care center, if the work up is unrevealing here.  Plan: 1) Check AIH serologies. 2) Okay with prednisone. 3) ? Liver biopsy. 4) Continue supportive care.  LOS: 5 days   Shelle Galdamez D 06/08/2020, 11:09 AM

## 2020-06-09 DIAGNOSIS — I609 Nontraumatic subarachnoid hemorrhage, unspecified: Secondary | ICD-10-CM | POA: Diagnosis not present

## 2020-06-09 LAB — COMPREHENSIVE METABOLIC PANEL
ALT: 93 U/L — ABNORMAL HIGH (ref 0–44)
AST: 167 U/L — ABNORMAL HIGH (ref 15–41)
Albumin: 1.7 g/dL — ABNORMAL LOW (ref 3.5–5.0)
Alkaline Phosphatase: 142 U/L — ABNORMAL HIGH (ref 38–126)
Anion gap: 6 (ref 5–15)
BUN: <5 mg/dL — ABNORMAL LOW (ref 6–20)
CO2: 23 mmol/L (ref 22–32)
Calcium: 7.7 mg/dL — ABNORMAL LOW (ref 8.9–10.3)
Chloride: 104 mmol/L (ref 98–111)
Creatinine, Ser: 0.38 mg/dL — ABNORMAL LOW (ref 0.44–1.00)
GFR, Estimated: >60 mL/min (ref >60)
Glucose, Bld: 89 mg/dL (ref 70–99)
Potassium: 3.6 mmol/L (ref 3.5–5.1)
Sodium: 133 mmol/L — ABNORMAL LOW (ref 135–145)
Total Bilirubin: UNDETERMINED mg/dL (ref 0.3–1.2)
Total Protein: 6.2 g/dL — ABNORMAL LOW (ref 6.5–8.1)

## 2020-06-09 LAB — GLUCOSE, CAPILLARY
Glucose-Capillary: 95 mg/dL (ref 70–99)
Glucose-Capillary: 87 mg/dL (ref 70–99)
Glucose-Capillary: 143 mg/dL — ABNORMAL HIGH (ref 70–99)
Glucose-Capillary: 112 mg/dL — ABNORMAL HIGH (ref 70–99)

## 2020-06-09 LAB — CBC
HCT: 27.9 % — ABNORMAL LOW (ref 36.0–46.0)
Hemoglobin: 9.2 g/dL — ABNORMAL LOW (ref 12.0–15.0)
MCH: 30.7 pg (ref 26.0–34.0)
MCHC: 33 g/dL (ref 30.0–36.0)
MCV: 93 fL (ref 80.0–100.0)
Platelets: 92 10*3/uL — ABNORMAL LOW (ref 150–400)
RBC: 3 MIL/uL — ABNORMAL LOW (ref 3.87–5.11)
RDW: 18.2 % — ABNORMAL HIGH (ref 11.5–15.5)
WBC: 5.1 10*3/uL (ref 4.0–10.5)
nRBC: 0 % (ref 0.0–0.2)

## 2020-06-09 LAB — APTT: aPTT: 41 seconds — ABNORMAL HIGH (ref 24–36)

## 2020-06-09 LAB — PROTIME-INR
INR: 1.2 (ref 0.8–1.2)
Prothrombin Time: 14.7 seconds (ref 11.4–15.2)

## 2020-06-09 MED ORDER — OSMOLITE 1.2 CAL PO LIQD: 1000.0000 mL | ORAL | Status: DC

## 2020-06-09 NOTE — Progress Notes (Signed)
Per face-to-face conversation with Dr. Venetia Constable, holding tube feeds but keeping active order in case needing to restart. Patient's mother assisting with feeding by bringing foods from home that she enjoys. Appetite is improving.

## 2020-06-09 NOTE — Progress Notes (Signed)
Subjective: The patient is somnolent but easily arousable.  Her boyfriend is at the bedside.  She is in no apparent distress.  Objective: Vital signs in last 24 hours: Temp:  [98.3 F (36.8 C)-100.4 F (38 C)] 98.5 F (36.9 C) (03/28 0736) Pulse Rate:  [82-102] 96 (03/28 0736) Resp:  [17-20] 17 (03/28 0736) BP: (104-120)/(73-78) 113/73 (03/28 0736) SpO2:  [95 %-100 %] 99 % (03/28 0736) Weight:  [50.7 kg] 50.7 kg (03/28 0349) Estimated body mass index is 20.44 kg/m as calculated from the following:   Height as of this encounter: 5\' 2"  (1.575 m).   Weight as of this encounter: 50.7 kg.   Intake/Output from previous day: 03/27 0701 - 03/28 0700 In: 327.1 [I.V.:327.1] Out: 1200 [Urine:1200] Intake/Output this shift: No intake/output data recorded.  Physical exam the patient is somnolent but arousable.  She answers questions.  She follows commands and moves all 4 extremities.  Her pupils are equal.  Lab Results: Recent Labs    06/08/20 0146 06/09/20 0456  WBC 5.6 5.1  HGB 9.3* 9.2*  HCT 28.6* 27.9*  PLT 130* 92*   BMET Recent Labs    06/08/20 0146 06/09/20 0456  NA 136 133*  K 3.5 3.6  CL 107 104  CO2 23 23  GLUCOSE 101* 89  BUN 5* <5*  CREATININE 0.40* 0.38*  CALCIUM 7.8* 7.7*    Studies/Results: DG Abd Portable 1V  Result Date: 06/08/2020 CLINICAL DATA:  Feeding tube placement. EXAM: PORTABLE ABDOMEN - 1 VIEW COMPARISON:  None. FINDINGS: A small bore feeding tube is noted with tip in the peripyloric region. Bowel gas pattern is unremarkable. IMPRESSION: Small bore feeding tube with tip in the peripyloric region. Electronically Signed   By: Margarette Canada M.D.   On: 06/08/2020 11:39    Assessment/Plan: Coagulopathy/intracerebral hemorrhage: The patient is clinically stable.  LOS: 6 days     Ophelia Charter 06/09/2020, 10:47 AM

## 2020-06-09 NOTE — Progress Notes (Signed)
Physical Therapy Treatment Patient Details Name: Jessica Frazier MRN: [id number] DOB: [birth year] Today's Date: 06/09/2020    History of Present Illness 20 y/o female presented to ED on 3/21 with chief complaint of abdominal pain and black stools. In ED, patient fell in bathroom and hit head. Head CT found multifocal acute subarachnoid hemmorhage and L parietal hematoma without skull fx. Repeat head CT showed L hypodense subdural hematoma with new midline shift. Evaluating for coagulopathy. PMH: hypothyroidism, RA    PT Comments    Pt in bed on arrival with her boyfriend present in room. Pt was [place] with family and unwilling to acknowledge therapist. Boyfriend encouraged her to end the call and participate. Pt presents with flat affect and little to no eye contact with therapist. Pt performed BLE exercises in supine. Bed transitioned to chair position. Session focused on pt pulling forward to sit using bilat lower bedrails.  She required max/total assist to pull forward and to maintain sitting balance. Bed returned to prior position at end of session. Boyfriend reported pt was able to feed herself cereal this morning.   Follow Up Recommendations  SNF;Supervision/Assistance - 24 hour     Equipment Recommendations  Wheelchair cushion (measurements PT);Wheelchair (measurements PT);Hospital bed (hoyer lift)    Recommendations for Other Services       Precautions / Restrictions Precautions Precautions: Fall;Other (comment) Precaution Comments: cortrak    Mobility  Bed Mobility Overal bed mobility: Needs Assistance Bed Mobility: Rolling Rolling: Total assist;+2 for physical assistance              Transfers Overall transfer level: Needs assistance               General transfer comment: Bed transitioned to chair position. Pull forward to sitting x 3 trials using bilat lower bedrails. Total assist for 1st trial and max assist for 2nd and 3rd trial. Able to sustain  sitting 5-15 seconds/trial.  Ambulation/Gait                 Stairs             Wheelchair Mobility    Modified Rankin (Stroke Patients Only) Modified Rankin (Stroke Patients Only) Pre-Morbid Rankin Score: No significant disability Modified Rankin: Severe disability     Balance Overall balance assessment: Needs assistance Sitting-balance support: Bilateral upper extremity supported Sitting balance-Leahy Scale: Zero Sitting balance - Comments: max/total assist to maintain sitting with bed in chair position.                                    Cognition Arousal/Alertness: Awake/alert Behavior During Therapy: Flat affect Overall Cognitive Status: Impaired/Different from baseline Area of Impairment: Memory;Following commands;Safety/judgement;Awareness;Problem solving                     Memory: Decreased short-term memory Following Commands: Follows one step commands inconsistently;Follows one step commands with increased time Safety/Judgement: Decreased awareness of safety;Decreased awareness of deficits Awareness: Emergent Problem Solving: Slow processing;Decreased initiation;Difficulty sequencing;Requires verbal cues;Requires tactile cues General Comments: Minimal eye contact with therapist. Slow to respond. Pt's boyfriend present in room, engaged and encouraging.      Exercises General Exercises - Lower Extremity Ankle Circles/Pumps: AROM;Both;10 reps;Supine Heel Slides: AAROM;Right;Left;10 reps;Supine Hip ABduction/ADduction: AROM;Both;10 reps;Supine    General Comments        Pertinent Vitals/Pain Pain Assessment: Faces Faces Pain Scale: Hurts even more Pain Location: back with mobility,  bilat ankles with ROM/exercise Pain Descriptors / Indicators: Grimacing;Discomfort Pain Intervention(s): Limited activity within patient's tolerance;Monitored during session;Repositioned    Home Living                      Prior  Function            PT Goals (current goals can now be found in the care plan section) Acute Rehab PT Goals Patient Stated Goal: did not state Progress towards PT goals: Progressing toward goals    Frequency    Min 3X/week      PT Plan Current plan remains appropriate    Co-evaluation              AM-PAC PT "6 Clicks" Mobility   Outcome Measure  Help needed turning from your back to your side while in a flat bed without using bedrails?: Total Help needed moving from lying on your back to sitting on the side of a flat bed without using bedrails?: Total Help needed moving to and from a bed to a chair (including a wheelchair)?: Total Help needed standing up from a chair using your arms (e.g., wheelchair or bedside chair)?: Total Help needed to walk in hospital room?: Total Help needed climbing 3-5 steps with a railing? : Total 6 Click Score: 6    End of Session   Activity Tolerance: Patient tolerated treatment well Patient left: in bed;with call bell/phone within reach;with bed alarm set;with family/visitor present Nurse Communication: Mobility status PT Visit Diagnosis: Unsteadiness on feet (R26.81);Other abnormalities of gait and mobility (R26.89);Muscle weakness (generalized) (M62.81);History of falling (Z91.81);Other symptoms and signs involving the nervous system (R29.898)     Time: 9767-3419 PT Time Calculation (min) (ACUTE ONLY): 32 min  Charges:  $Therapeutic Exercise: 8-22 mins $Therapeutic Activity: 8-22 mins                     Lorrin Goodell, PT  Office # [phone] Pager [phone]    Lorriane Shire 06/09/2020, 1:19 PM

## 2020-06-09 NOTE — Plan of Care (Signed)

## 2020-06-09 NOTE — Progress Notes (Signed)
[facility] PROGRESS NOTE    Progress Note  Jessica Frazier  YBO:[id number] DOB: 2000/06/30 DOA: 06/02/2020 PCP: Pcp, No     Brief Narrative:   Jessica Frazier is an 20 y.o. female past medical history significant for seronegative rheumatoid arthritis and hypothyroidism admitted in January for elevation in LFTs felt to be due to medication.  Comes in to [facility] on 06/02/2020 complains of abdominal pain and dark tarry stools with intermittent left upper quadrant and right lower quadrant pain.  In the ED lost her balance and hit her head CT scan of the head showed a subarachnoid hemorrhage with platelet count of 43 neurosurgery was consulted, patient was unable to complete FFP due to fevers repeated CT scan showed expanding subarachnoid hemorrhage Significant Events: 06/03/2000 admitted to the ICU for subarachnoid, subdural hemorrhage.   Significant studies: 06/03/2020 CT scan of the abdomen and pelvis showed fatty liver with hepatic steatosis small ascites. 05/14/2020 CT of the head new multifocal intracranial hemorrhage with expanding left scalp hematoma and new left-sided subdural hematoma, mild subarachnoid hemorrhage, with small midline shift. 06/04/2020 bone marrow biopsy.  Antibiotics: None  Microbiology data: Blood culture:  Procedures: None  Assessment/Plan:   Traumatic subarachnoid hemorrhage, subdural hematoma and ICH right frontal contusion in the setting of coagulopathy and thrombocytopenia: In the setting of coagulopathy and pancytopenia, especially thrombocytopenia. Neurosurgery was consulted who recommended no surgical intervention at this time.  Neurology recommended medical management with hematology, rescan only if neurologically she declines. Neurological exam yesterday seems to be stable. Delirium precaution minimize CNS depressant medication.  Hematology/coagulopathy pancytopenia: Etiology unknown differential complicated by poor oral intake, recent acute  liver injury and recent upadacitinib (which led to acute liver injury) and poorly defined rheumatoid arthritis over past 2 years. Pancytopenia has now resolved,  In the setting of steroid use, which brings into question if she has had untreated suboptimal underlying autoimmune disease.  Also on the differential her cytopenias could be related to poor oral intake in the setting of starvation related to acute fatty liver. Hematology is on board and recommended to continue steroids and tapered over the next 2 weeks.,  Based on rheumatology plan Continue vitamin K p.o. daily for 2 weeks. 06/04/2020 bone marrow biopsy showed no blasts on smear final path is pending. Hematology has signed off, recommended vitamin K for 2 weeks. Continue to monitor PT/INR fibrinogen and CBC, will transfuse 1 platelets less than 50, INR less than 1.5 and fibrinogen less than 200. Hemoglobin is at 10, platelet count is 160, last PT/INR was 13.9 and 1.1  Fatty liver, chronic nausea vomiting for 2 months: Unclear etiology acute hepatitis last month was attributed to updacitinib.  GI concern about starvation. LFTs have leveled up. Appreciate GI assistance. Admitted hepatitis neurology are pending. Continue steroids.  Seronegative rheumatoid arthritis/polyarthritis: ANA was positive on 06/03/2020 2022 acute hepatitis panel was negative. LDH high at 341, factor 8 180, factor V assay 152, factor X 104, lupus anticoagulant unremarkable, anticardiolipin was positive IgM of 115 IgG 10 beta-2 glycoprotein proteins unremarkable. Further work-up is pending  flow cytometry. She is currently on steroids 40 mg. Continue supportive care.  Hypothyroidism: We will need repeated TSH and free T4 as an outpatient there are abnormal now likely due sick euthyroid syndrome.  Protein caloric malnutrition severe: Core track in place continue nutritional supplements. Psychiatry was consulted and they related that she has no eating  disorder.  Acute kidney injury: Continue to follow strict I's and O's, but not in the  nephrotic range no white blood cells or red blood cells in urine.  Depression: In speaking to the mother her motivation and appetite have significant degrees we will go ahead and start her on Prozac.  DVT prophylaxis: SCD Family Communication:mother Status is: Inpatient  Remains inpatient appropriate because:Hemodynamically unstable   Dispo: The patient is from: Home              Anticipated d/c is to: Home              Patient currently is not medically stable to d/c.   Difficult to place patient No     Code Status:     Code Status Orders  (From admission, onward)         Start     Ordered   06/03/20 1017  Full code  Continuous        06/03/20 1021        Code Status History    Date Active Date Inactive Code Status Order ID Comments User Context   04/09/2020 1430 04/10/2020 1958 Full Code [id number]  Delora Fuel, MD ED   Advance Care Planning Activity        IV Access:    Peripheral IV   Procedures and diagnostic studies:   DG Abd Portable 1V  Result Date: 06/08/2020 CLINICAL DATA:  Feeding tube placement. EXAM: PORTABLE ABDOMEN - 1 VIEW COMPARISON:  None. FINDINGS: A small bore feeding tube is noted with tip in the peripyloric region. Bowel gas pattern is unremarkable. IMPRESSION: Small bore feeding tube with tip in the peripyloric region. Electronically Signed   By: Margarette Canada M.D.   On: 06/08/2020 11:39     Medical Consultants:    None.   Subjective:    [name] she is tolerating her diet she ate very well yesterday. Objective:    Vitals:   06/08/20 2346 06/09/20 0349 06/09/20 0517 06/09/20 0736  BP: 119/78 118/73  113/73  Pulse: 86 (!) 101  96  Resp: 18 18  17   Temp: 98.8 F (37.1 C) (!) 100.4 F (38 C) 98.8 F (37.1 C) 98.5 F (36.9 C)  TempSrc: Oral Oral Oral Oral  SpO2: 100% 95%  99%  Weight:  50.7 kg    Height:       SpO2: 99  %   Intake/Output Summary (Last 24 hours) at 06/09/2020 1041 Last data filed at 06/09/2020 0555 Gross per 24 hour  Intake 327.13 ml  Output 1200 ml  Net -872.87 ml   Filed Weights   06/06/20 0300 06/08/20 0534 06/09/20 0349  Weight: 55.6 kg 50.7 kg 50.7 kg    Exam: General exam: In no acute distress, cachectic Respiratory system: Good air movement and clear to auscultation. Cardiovascular system: S1 & S2 heard, RRR. No JVD. Gastrointestinal system: Abdomen is nondistended, soft and nontender.  Extremities: No pedal edema. Skin: No rashes, lesions or ulcers Psychiatry: Judgement and insight appear normal. Mood & affect appropriate.   Data Reviewed:    Labs: Basic Metabolic Panel: Recent Labs  Lab 06/06/20 0321 06/06/20 1648 06/07/20 0458 06/07/20 0754 06/07/20 1649 06/08/20 0146 06/09/20 0456  NA 132*  --  136 137  --  136 133*  K 3.9  --  3.6 3.6  --  3.5 3.6  CL 102  --  104 105  --  107 104  CO2 24  --  25 27  --  23 23  GLUCOSE 86  --  95 96  --  101* 89  BUN 11  --  6 5*  --  5* <5*  CREATININE 0.38*  --  0.41* 0.45  --  0.40* 0.38*  CALCIUM 7.8*  --  7.9* 7.9*  --  7.8* 7.7*  MG 2.3 2.0 2.0  --  2.0 1.9  --   PHOS 3.3 3.0 2.7  --  2.8 2.5  --    GFR Estimated Creatinine Clearance: 88.7 mL/min (A) (by C-G formula based on SCr of 0.38 mg/dL (L)). Liver Function Tests: Recent Labs  Lab 06/06/20 0321 06/07/20 0458 06/07/20 0754 06/08/20 0146 06/09/20 0456  AST 81* 218* 237* 280* 167*  ALT 29 82* 87* 122* 93*  ALKPHOS 144* 193* 183* 188* 142*  BILITOT 0.8 0.8 0.8 0.9 QUANTITY NOT SUFFICIENT, UNABLE TO PERFORM TEST  PROT 6.7 6.5 6.7 6.5 6.2*  ALBUMIN 2.0* 1.9* 1.9* 1.9* 1.7*   Recent Labs  Lab 06/02/20 1453 06/03/20 1147  LIPASE 42  --   AMYLASE  --  21*   No results for input(s): AMMONIA in the last 168 hours. Coagulation profile Recent Labs  Lab 06/05/20 1236 06/06/20 0321 06/07/20 0458 06/08/20 0146 06/09/20 0456  INR 1.1 1.1 1.1 1.2  1.2   COVID-19 Labs  Recent Labs    06/08/20 1312  FERRITIN 541*    Lab Results  Component Value Date   SARSCOV2NAA NEGATIVE 06/03/2020   [place] NEGATIVE 04/09/2020    CBC: Recent Labs  Lab 06/03/20 1101 06/03/20 1110 06/04/20 2142 06/05/20 0415 06/05/20 1236 06/06/20 0321 06/07/20 0458 06/08/20 0146 06/09/20 0456  WBC  --    < > 4.4  4.4   < > 3.6* 5.2 5.5 5.6 5.1  NEUTROABS 1.8  --  2.4  --   --   --   --   --   --   HGB  --    < > 7.8*  7.8*   < > 8.6* 9.2* 10.1* 9.3* 9.2*  HCT  --    < > 23.5*  24.1*   < > 25.5* 27.2* 29.2* 28.6* 27.9*  MCV  --    < > 91.4  93.1   < > 91.1 91.3 92.1 94.1 93.0  PLT  --    < > 152  157   < > 160 174 168 130* 92*   < > = values in this interval not displayed.   Cardiac Enzymes: Recent Labs  Lab 06/03/20 1101 06/07/20 0754  CKTOTAL 29* 20*   BNP (last 3 results) No results for input(s): PROBNP in the last 8760 hours. CBG: Recent Labs  Lab 06/08/20 1640 06/08/20 2016 06/08/20 2306 06/09/20 0542 06/09/20 0740  GLUCAP 108* 97 78 95 87   D-Dimer: No results for input(s): DDIMER in the last 72 hours. Hgb A1c: No results for input(s): HGBA1C in the last 72 hours. Lipid Profile: No results for input(s): CHOL, HDL, LDLCALC, TRIG, CHOLHDL, LDLDIRECT in the last 72 hours. Thyroid function studies: No results for input(s): TSH, T4TOTAL, T3FREE, THYROIDAB in the last 72 hours.  Invalid input(s): FREET3 Anemia work up: Recent Labs    06/08/20 1312  FERRITIN 541*  TIBC 192*  IRON 28   Sepsis Labs: Recent Labs  Lab 06/06/20 0321 06/07/20 0458 06/08/20 0146 06/09/20 0456  WBC 5.2 5.5 5.6 5.1   Microbiology Recent Results (from the past 240 hour(s))  SARS CORONAVIRUS 2 (TAT 6-24 HRS) Nasopharyngeal Nasopharyngeal Swab     Status: None   Collection Time: 06/03/20 12:22 AM  Specimen: Nasopharyngeal Swab  Result Value Ref Range Status   SARS Coronavirus 2 NEGATIVE NEGATIVE Final    Comment:  (NOTE) SARS-CoV-2 target nucleic acids are NOT DETECTED.  The SARS-CoV-2 RNA is generally detectable in upper and lower respiratory specimens during the acute phase of infection. Negative results do not preclude SARS-CoV-2 infection, do not rule out co-infections with other pathogens, and should not be used as the sole basis for treatment or other patient management decisions. Negative results must be combined with clinical observations, patient history, and epidemiological information. The expected result is Negative.  Fact Sheet for Patients: SugarRoll.be  Fact Sheet for Healthcare Providers: https://www.woods-mathews.com/  This test is not yet approved or cleared by the [place] FDA and  has been authorized for detection and/or diagnosis of SARS-CoV-2 by FDA under an Emergency Use Authorization (EUA). This EUA will remain  in effect (meaning this test can be used) for the duration of the COVID-19 declaration under Se ction 564(b)(1) of the Act, 21 U.S.C. section 360bbb-3(b)(1), unless the authorization is terminated or revoked sooner.  Performed at [facility], [place] [address] [place], [place] [address]   Culture, blood (routine x 2)     Status: None (Preliminary result)   Collection Time: 06/08/20  1:46 AM   Specimen: BLOOD RIGHT HAND  Result Value Ref Range Status   Specimen Description BLOOD RIGHT HAND  Final   Special Requests   Final    BOTTLES DRAWN AEROBIC AND ANAEROBIC Blood Culture adequate volume   Culture   Final    NO GROWTH 1 DAY Performed at [facility], [place] [address], [place], [place] [address]    Report Status PENDING  Incomplete  Culture, blood (routine x 2)     Status: None (Preliminary result)   Collection Time: 06/08/20  1:56 AM   Specimen: BLOOD  Result Value Ref Range Status   Specimen Description BLOOD RIGHT ANTECUBITAL  Final   Special Requests   Final    BOTTLES DRAWN AEROBIC AND  ANAEROBIC Blood Culture results may not be optimal due to an inadequate volume of blood received in culture bottles   Culture   Final    NO GROWTH 1 DAY Performed at [facility], [place] [address], [place], [place] [address]    Report Status PENDING  Incomplete     Medications:   . B-complex with vitamin C  1 tablet Oral Daily  . Chlorhexidine Gluconate Cloth  6 each Topical Daily  . cholecalciferol  1,000 Units Oral Daily  . FLUoxetine  10 mg Oral Daily  . levothyroxine  75 mcg Oral QAC breakfast  . multivitamin with minerals  1 tablet Oral Daily  . pantoprazole  40 mg Oral QHS  . phytonadione  10 mg Oral Daily   Followed by  . [START ON 06/10/2020] phytonadione  5 mg Oral Daily  . predniSONE  40 mg Oral Q breakfast  . thiamine  100 mg Oral Daily   Continuous Infusions: . sodium chloride 10 mL/hr at 06/06/20 1600  . feeding supplement (OSMOLITE 1.2 CAL)        LOS: 6 days   Charlynne Cousins  [facility]  06/09/2020, 10:41 AM

## 2020-06-09 NOTE — Progress Notes (Signed)
UNASSIGNED PATIENT Subjective: Since I last evaluated the patient, she seems to be doing much better today. She is awake alert and more interactive.  Her mother my boyfriend at bedside. She is eating chicken nuggets brought in by her boyfriend with ranch dressing and seems to be tolerating that well. She denies having any abdominal pain nausea vomiting.  Work-up for LFTs is pending multiple labs have been ordered that have not resulted yet.  Objective: Vital signs in last 24 hours: Temp:  [97.9 F (36.6 C)-100.4 F (38 C)] 97.9 F (36.6 C) (03/28 1125) Pulse Rate:  [77-101] 77 (03/28 1125) Resp:  [17-18] 18 (03/28 1125) BP: (104-119)/(73-82) 117/82 (03/28 1125) SpO2:  [95 %-100 %] 100 % (03/28 1125) Weight:  [50.7 kg] 50.7 kg (03/28 0349) Last BM Date: 06/08/20  Intake/Output from previous day: 03/27 0701 - 03/28 0700 In: 327.1 [I.V.:327.1] Out: 1200 [Urine:1200] Intake/Output this shift: Total I/O In: -  Out: 620 [Urine:620]  General appearance: alert, cooperative, cachectic, no distress and pale Resp: clear to auscultation bilaterally Cardio: regular rate and rhythm, S1, S2 normal, no murmur, click, rub or gallop GI: soft, non-tender; bowel sounds normal; no masses, liver palpable 3 cm below right costal margin  Extremities: extremities normal, atraumatic, no cyanosis or edema  Lab Results: Recent Labs    06/07/20 0458 06/08/20 0146 06/09/20 0456  WBC 5.5 5.6 5.1  HGB 10.1* 9.3* 9.2*  HCT 29.2* 28.6* 27.9*  PLT 168 130* 92*   BMET Recent Labs    06/07/20 0754 06/08/20 0146 06/09/20 0456  NA 137 136 133*  K 3.6 3.5 3.6  CL 105 107 104  CO2 27 23 23   GLUCOSE 96 101* 89  BUN 5* 5* <5*  CREATININE 0.45 0.40* 0.38*  CALCIUM 7.9* 7.8* 7.7*   LFT Recent Labs    06/09/20 0456  PROT 6.2*  ALBUMIN 1.7*  AST 167*  ALT 93*  ALKPHOS 142*  BILITOT QUANTITY NOT SUFFICIENT, UNABLE TO PERFORM TEST   PT/INR Recent Labs    06/08/20 0146 06/09/20 0456  LABPROT  14.7 14.7  INR 1.2 1.2     Studies/Results: DG Abd Portable 1V  Result Date: 06/08/2020 CLINICAL DATA:  Feeding tube placement. EXAM: PORTABLE ABDOMEN - 1 VIEW COMPARISON:  None. FINDINGS: A small bore feeding tube is noted with tip in the peripyloric region. Bowel gas pattern is unremarkable. IMPRESSION: Small bore feeding tube with tip in the peripyloric region. Electronically Signed   By: Margarette Canada M.D.   On: 06/08/2020 11:39   Medications: I have reviewed the patient's current medications.  Assessment/Plan: 1) Abnormal LFTs with severe fatty liver and hepatomegaly-?  Drug injury work-up in progress-labs pending. 2) Abnormal weight loss. 3) Multifocal subarachnoid hemorrhages. 4) Polyarthritis   LOS: 6 days   Juanita Craver 06/09/2020, 1:19 PM

## 2020-06-10 DIAGNOSIS — I609 Nontraumatic subarachnoid hemorrhage, unspecified: Secondary | ICD-10-CM | POA: Diagnosis not present

## 2020-06-10 LAB — GLUCOSE, CAPILLARY
Glucose-Capillary: 114 mg/dL — ABNORMAL HIGH (ref 70–99)
Glucose-Capillary: 81 mg/dL (ref 70–99)
Glucose-Capillary: 88 mg/dL (ref 70–99)

## 2020-06-10 LAB — CBC
HCT: 28.6 % — ABNORMAL LOW (ref 36.0–46.0)
Hemoglobin: 9 g/dL — ABNORMAL LOW (ref 12.0–15.0)
MCH: 30.2 pg (ref 26.0–34.0)
MCHC: 31.5 g/dL (ref 30.0–36.0)
MCV: 96 fL (ref 80.0–100.0)
Platelets: 104 10*3/uL — ABNORMAL LOW (ref 150–400)
RBC: 2.98 MIL/uL — ABNORMAL LOW (ref 3.87–5.11)
RDW: 18.4 % — ABNORMAL HIGH (ref 11.5–15.5)
WBC: 3.8 10*3/uL — ABNORMAL LOW (ref 4.0–10.5)
nRBC: 0 % (ref 0.0–0.2)

## 2020-06-10 LAB — PROTIME-INR
INR: 1.1 (ref 0.8–1.2)
Prothrombin Time: 14.1 seconds (ref 11.4–15.2)

## 2020-06-10 LAB — COMPREHENSIVE METABOLIC PANEL
ALT: 94 U/L — ABNORMAL HIGH (ref 0–44)
AST: 155 U/L — ABNORMAL HIGH (ref 15–41)
Albumin: 1.7 g/dL — ABNORMAL LOW (ref 3.5–5.0)
Alkaline Phosphatase: 153 U/L — ABNORMAL HIGH (ref 38–126)
Anion gap: 5 (ref 5–15)
BUN: <5 mg/dL — ABNORMAL LOW (ref 6–20)
CO2: 27 mmol/L (ref 22–32)
Calcium: 7.9 mg/dL — ABNORMAL LOW (ref 8.9–10.3)
Chloride: 105 mmol/L (ref 98–111)
Creatinine, Ser: 0.34 mg/dL — ABNORMAL LOW (ref 0.44–1.00)
GFR, Estimated: >60 mL/min (ref >60)
Glucose, Bld: 83 mg/dL (ref 70–99)
Potassium: 3.5 mmol/L (ref 3.5–5.1)
Sodium: 137 mmol/L (ref 135–145)
Total Bilirubin: 0.6 mg/dL (ref 0.3–1.2)
Total Protein: 6.2 g/dL — ABNORMAL LOW (ref 6.5–8.1)

## 2020-06-10 LAB — ZINC: Zinc: 41 ug/dL — ABNORMAL LOW (ref 44–115)

## 2020-06-10 LAB — IGG: IgG (Immunoglobin G), Serum: 2528 mg/dL — ABNORMAL HIGH (ref 586–1602)

## 2020-06-10 LAB — CERULOPLASMIN: Ceruloplasmin: 16.8 mg/dL — ABNORMAL LOW (ref 19.0–39.0)

## 2020-06-10 LAB — APTT: aPTT: 38 seconds — ABNORMAL HIGH (ref 24–36)

## 2020-06-10 LAB — ANTI-MICROSOMAL ANTIBODY LIVER / KIDNEY: LKM1 Ab: 2 Units (ref 0.0–20.0)

## 2020-06-10 LAB — ANTI-SMOOTH MUSCLE ANTIBODY, IGG: F-Actin IgG: 29 Units — ABNORMAL HIGH (ref 0–19)

## 2020-06-10 LAB — ALPHA-1-ANTITRYPSIN: A-1 Antitrypsin, Ser: 145 mg/dL (ref 100–188)

## 2020-06-10 MED ORDER — ENSURE ENLIVE PO LIQD
237.0000 mL | Freq: Three times a day (TID) | ORAL | Status: DC
  Administered 2020-06-10 – 2020-06-16 (×11): 237 mL via ORAL

## 2020-06-10 MED ORDER — MIRTAZAPINE 15 MG PO TABS
30.0000 mg | ORAL_TABLET | Freq: Every day | ORAL | Status: DC
  Administered 2020-06-10 – 2020-06-15 (×6): 30 mg via ORAL
  Filled 2020-06-10 (×6): qty 2

## 2020-06-10 MED ORDER — TRAMADOL HCL 50 MG PO TABS: 50.0000 mg | ORAL_TABLET | Freq: Four times a day (QID) | ORAL | Status: DC | PRN

## 2020-06-10 NOTE — Progress Notes (Signed)
Subjective: The patient is more alert today.  She is browsing her phone.  She has no complaints.  Objective: Vital signs in last 24 hours: Temp:  [97.7 F (36.5 C)-98.8 F (37.1 C)] 98.8 F (37.1 C) (03/29 0728) Pulse Rate:  [77-95] 95 (03/29 0728) Resp:  [16-18] 18 (03/29 0728) BP: (116-121)/(70-82) 116/70 (03/29 0728) SpO2:  [100 %] 100 % (03/29 0728) Estimated body mass index is 20.44 kg/m as calculated from the following:   Height as of this encounter: 5\' 2"  (1.575 m).   Weight as of this encounter: 50.7 kg.   Intake/Output from previous day: 03/28 0701 - 03/29 0700 In: -  Out: 3491 [Urine:1670] Intake/Output this shift: No intake/output data recorded.  Physical exam the patient is alert and oriented.  She is moving all 4 extremities well.  Her speech is normal.  Lab Results: Recent Labs    06/09/20 0456 06/10/20 0311  WBC 5.1 3.8*  HGB 9.2* 9.0*  HCT 27.9* 28.6*  PLT 92* 104*   BMET Recent Labs    06/09/20 0456 06/10/20 0311  NA 133* 137  K 3.6 3.5  CL 104 105  CO2 23 27  GLUCOSE 89 83  BUN <5* <5*  CREATININE 0.38* 0.34*  CALCIUM 7.7* 7.9*    Studies/Results: DG Abd Portable 1V  Result Date: 06/08/2020 CLINICAL DATA:  Feeding tube placement. EXAM: PORTABLE ABDOMEN - 1 VIEW COMPARISON:  None. FINDINGS: A small bore feeding tube is noted with tip in the peripyloric region. Bowel gas pattern is unremarkable. IMPRESSION: Small bore feeding tube with tip in the peripyloric region. Electronically Signed   By: Margarette Canada M.D.   On: 06/08/2020 11:39    Assessment/Plan: Intracerebral hemorrhage, coagulopathy: The patient continues to do well clinically.  LOS: 7 days     Ophelia Charter 06/10/2020, 7:47 AM

## 2020-06-10 NOTE — Progress Notes (Signed)
Nutrition Follow-up  DOCUMENTATION CODES:   Severe malnutrition in context of chronic illness  INTERVENTION:  Recommend supplementation of zinc given deficiency   -Ensure Enlive po TID, each supplement provides 350 kcal and 20 grams of protein -Magic cup BID with meals, each supplement provides 290 kcal and 9 grams of protein -Continue MVI with minerals daily -Provided education on improving nutrition status after discharge   NUTRITION DIAGNOSIS:   Severe Malnutrition related to chronic illness (> 3 months) as evidenced by severe muscle depletion,severe fat depletion,moderate fat depletion,energy intake < or equal to 75% for > or equal to 1 month. -- ongoing   GOAL:   Patient will meet greater than or equal to 90% of their needs -- not met  MONITOR:   PO intake,TF tolerance  REASON FOR ASSESSMENT:   Consult Enteral/tube feeding initiation and management,Assessment of nutrition requirement/status,Calorie Count  ASSESSMENT:   Pt with PMH of hypothyroidism, Bell's palsy, and possible seronegative RA admitted with coagulopathy, macrocytic anemia, thrombocytopenia, leukopenia of unknown etiology, fatty liver with chronic N/V/D x 2 months, and fall in ED resulting in TBI (SDH, SAH, ICH R frontal contusion).  3/25 Cortrak placed (gastric tip)  Per MD, pt continues to be stable on neurological exam and her strength is improving. Autoimmune hepatitis panel pending, GI following.   Pt briefly received TF via Cortrak; however, this was removed yesterday despite pt's intake being insufficient to meet her needs (0-50% meal completion x 8 recorded meals; 23% average meal intake). Pt was initiated on Remeron for depression and appetite stimulation. MD notes pt will likely be discharged within 48-72 hours if po intake improves. Pt states she will try to eat more. RD will order oral nutrition supplements and provide pt with information on how to improve nutrition status after discharge.    UOP: 1616m x24 hours  Medications: b-complex with vitamin c, vitamin D3, remeron, mvi with minerals, protonix, vitamin K, deltasone, thiamine Labs reviewed.   Vitamin Labs: Vitamin C - pending  Vitamin D - 27.24 (L) Vitamin B1 - pending Vitamin B6 - pending Vitamin A - pending Zinc - 41 (L)  Diet Order:   Diet Order            Diet regular Room service appropriate? Yes; Fluid consistency: Thin  Diet effective now                 EDUCATION NEEDS:   Education needs have been addressed  Skin:  Skin Assessment: Skin Integrity Issues: Skin Integrity Issues:: Other (Comment),Incisions Incisions: wound/incision to head Other: pressure injury to coccyx (stage not indicated)  Last BM:  3/27 type 4  Height:   Ht Readings from Last 1 Encounters:  06/03/20 5' 2"  (1.575 m)    Weight:   Wt Readings from Last 1 Encounters:  06/09/20 50.7 kg    Ideal Body Weight:  50 kg  BMI:  Body mass index is 20.44 kg/m.  Estimated Nutritional Needs:   Kcal:  1900-2100  Protein:  80-95 grams  Fluid:  >2 L/day   ALarkin Ina MS, RD, LDN RD pager number and weekend/on-call pager number located in [facility]

## 2020-06-10 NOTE — Progress Notes (Signed)
Subjective: No complaints.  Feeling well.  Objective: Vital signs in last 24 hours: Temp:  [97.7 F (36.5 C)-98.8 F (37.1 C)] 98.8 F (37.1 C) (03/29 0728) Pulse Rate:  [77-95] 95 (03/29 0728) Resp:  [16-18] 18 (03/29 0728) BP: (116-121)/(70-82) 116/70 (03/29 0728) SpO2:  [100 %] 100 % (03/29 0728) Last BM Date: 06/08/20  Intake/Output from previous day: 03/28 0701 - 03/29 0700 In: -  Out: 9323 [Urine:1670] Intake/Output this shift: No intake/output data recorded.  General appearance: alert and no distress GI: Mild RUQ tenderness  Lab Results: Recent Labs    06/08/20 0146 06/09/20 0456 06/10/20 0311  WBC 5.6 5.1 3.8*  HGB 9.3* 9.2* 9.0*  HCT 28.6* 27.9* 28.6*  PLT 130* 92* 104*   BMET Recent Labs    06/08/20 0146 06/09/20 0456 06/10/20 0311  NA 136 133* 137  K 3.5 3.6 3.5  CL 107 104 105  CO2 23 23 27   GLUCOSE 101* 89 83  BUN 5* <5* <5*  CREATININE 0.40* 0.38* 0.34*  CALCIUM 7.8* 7.7* 7.9*   LFT Recent Labs    06/10/20 0311  PROT 6.2*  ALBUMIN 1.7*  AST 155*  ALT 94*  ALKPHOS 153*  BILITOT 0.6   PT/INR Recent Labs    06/09/20 0456 06/10/20 0311  LABPROT 14.7 14.1  INR 1.2 1.1   Hepatitis Panel No results for input(s): HEPBSAG, HCVAB, HEPAIGM, HEPBIGM in the last 72 hours. C-Diff No results for input(s): CDIFFTOX in the last 72 hours. Fecal Lactopherrin No results for input(s): FECLLACTOFRN in the last 72 hours.  Studies/Results: DG Abd Portable 1V  Result Date: 06/08/2020 CLINICAL DATA:  Feeding tube placement. EXAM: PORTABLE ABDOMEN - 1 VIEW COMPARISON:  None. FINDINGS: A small bore feeding tube is noted with tip in the peripyloric region. Bowel gas pattern is unremarkable. IMPRESSION: Small bore feeding tube with tip in the peripyloric region. Electronically Signed   By: Margarette Canada M.D.   On: 06/08/2020 11:39    Medications:  Scheduled: . B-complex with vitamin C  1 tablet Oral Daily  . Chlorhexidine Gluconate Cloth  6 each  Topical Daily  . cholecalciferol  1,000 Units Oral Daily  . FLUoxetine  10 mg Oral Daily  . levothyroxine  75 mcg Oral QAC breakfast  . multivitamin with minerals  1 tablet Oral Daily  . pantoprazole  40 mg Oral QHS  . phytonadione  5 mg Oral Daily  . predniSONE  40 mg Oral Q breakfast  . thiamine  100 mg Oral Daily   Continuous: . sodium chloride 10 mL/hr at 06/06/20 1600  . feeding supplement (OSMOLITE 1.2 CAL)      Assessment/Plan: 1) Abnormal liver enzymes. 2) Hepatomegaly. 3) Hepatic steatosis. 4) SAH.   The patient is alert.  This is the most alert state since her admission.  Her liver enzymes are stable.  The AIH serologies are pending.  Plan: 1) Await blood work results. 2) Continue with supportive care.  LOS: 7 days   Fradel Baldonado D 06/10/2020, 8:03 AM

## 2020-06-10 NOTE — Plan of Care (Signed)

## 2020-06-10 NOTE — Progress Notes (Addendum)
[facility] PROGRESS NOTE    Progress Note  Jessica Frazier  WUJ:[id number] DOB: [birth year] DOA: 06/02/2020 PCP: Merryl Hacker, No     Brief Narrative:   Jessica Frazier is an 20 y.o. female past medical history significant for seronegative rheumatoid arthritis and hypothyroidism admitted in January for elevation in LFTs felt to be due to medication.  Comes in to [facility] on 06/02/2020 complains of abdominal pain and dark tarry stools with intermittent left upper quadrant and right lower quadrant pain.  In the ED lost her balance and hit her head CT scan of the head showed a subarachnoid hemorrhage with platelet count of 43 neurosurgery was consulted, patient was unable to complete FFP due to fevers repeated CT scan showed expanding subarachnoid hemorrhage Significant Events: 06/03/2000 admitted to the ICU for subarachnoid, subdural hemorrhage.   Significant studies: 06/03/2020 CT scan of the abdomen and pelvis showed fatty liver with hepatic steatosis small ascites. 05/14/2020 CT of the head new multifocal intracranial hemorrhage with expanding left scalp hematoma and new left-sided subdural hematoma, mild subarachnoid hemorrhage, with small midline shift. 06/04/2020 bone marrow biopsy.  Antibiotics: None  Microbiology data: Blood culture:  Procedures: None  Assessment/Plan:   Traumatic subarachnoid hemorrhage, subdural hematoma and ICH right frontal contusion in the setting of coagulopathy and thrombocytopenia: In the setting of coagulopathy and pancytopenia, especially thrombocytopenia. Neurosurgery was consulted who recommended no surgical intervention at this time.  Neurology recommended medical management with hematology, rescan only if neurologically she declines. Neurological exam today continues to be stable, her strength is improving. Delirium precaution minimize CNS depressant medication. Out of bed to chair.  Hematology/coagulopathy pancytopenia: Etiology  unknown differential complicated by poor oral intake, recent acute liver injury and recent upadacitinib (which led to acute liver injury) and poorly defined rheumatoid arthritis over past 2 years. Pancytopenia has now resolved,  In the setting of steroid use, which brings into question if she has had untreated suboptimal underlying autoimmune disease.  Also on the differential her cytopenias could be related to poor oral intake in the setting of starvation related to acute fatty liver. Hematology is on board and recommended to continue steroids and tapered over the next 2 weeks.,  Based on rheumatology plan Continue vitamin K p.o. daily for 2 weeks. 06/04/2020 bone marrow biopsy showed no blasts on smear final path is pending. Hematology has signed off, recommended vitamin K for 2 weeks. Continue to monitor PT/INR fibrinogen and CBC, will transfuse 1 platelets less than 50, INR less than 1.5 and fibrinogen less than 200. Hemoglobin is at 10, platelet count is 160, last PT/INR was 13.9 and 1.1  Fatty liver, chronic nausea vomiting for 2 months: Unclear etiology acute hepatitis last month was attributed to updacitinib.  GI concern about starvation. LFTs have leveled up. Appreciate GI assistance. Admitted hepatitis neurology are pending. Continue steroids.  Seronegative rheumatoid arthritis/polyarthritis: ANA was positive on 06/03/2020 2022 acute hepatitis panel was negative. LDH high at 341, factor 8 180, factor V assay 152, factor X 104, lupus anticoagulant unremarkable, anticardiolipin was positive IgM of 115 IgG 10 beta-2 glycoprotein proteins unremarkable. Further work-up is pending  flow cytometry. She is currently on steroids 40 mg. Continue supportive care.  Hypothyroidism: We will need repeated TSH and free T4 as an outpatient there are abnormal now likely due sick euthyroid syndrome.  Protein caloric malnutrition severe: Patient started to eat her food and the core track was  discontinued.   She was started on Remeron for depression and to  stimulate her appetite. She is only eating about 50% of her food. If she continues to increase her caloric intake we will probably get her discharged within 48 hours to 72 hours.  Acute kidney injury: Continue to follow strict I's and O's, but not in the nephrotic range no white blood cells or red blood cells in urine.  Depression: In speaking to the mother her motivation and appetite have significant degrees we will go ahead and start her on Prozac.  DVT prophylaxis: SCD Family Communication:mother Status is: Inpatient  Remains inpatient appropriate because:Hemodynamically unstable   Dispo: The patient is from: Home              Anticipated d/c is to: Home              Patient currently is not medically stable to d/c.  Patient can probably be discharged within 48 to 72 hours as her caloric intake continues during proved, she also has her autoimmune hepatitis panel work-up pending which GI is following her for.   Difficult to place patient No     Code Status:     Code Status Orders  (From admission, onward)         Start     Ordered   06/03/20 1017  Full code  Continuous        06/03/20 1021        Code Status History    Date Active Date Inactive Code Status Order ID Comments User Context   04/09/2020 1430 04/10/2020 1958 Full Code [id number]  Delora Fuel, MD ED   Advance Care Planning Activity        IV Access:    Peripheral IV   Procedures and diagnostic studies:   DG Abd Portable 1V  Result Date: 06/08/2020 CLINICAL DATA:  Feeding tube placement. EXAM: PORTABLE ABDOMEN - 1 VIEW COMPARISON:  None. FINDINGS: A small bore feeding tube is noted with tip in the peripyloric region. Bowel gas pattern is unremarkable. IMPRESSION: Small bore feeding tube with tip in the peripyloric region. Electronically Signed   By: Margarette Canada M.D.   On: 06/08/2020 11:39     Medical Consultants:     None.   Subjective:    Jessica Frazier in a better mood this morning upbeat she relates she will try to eat more of her food. Objective:    Vitals:   06/09/20 2013 06/10/20 0005 06/10/20 0500 06/10/20 0728  BP: 118/82 118/71 118/76 116/70  Pulse: 77 89 79 95  Resp: _0 Temp: 97.9 F (36.6 C) 98.1 F (36.7 C) 98.2 F (36.8 C) 98.8 F (37.1 C)  TempSrc: Oral Oral Oral Oral  SpO2: 100% 100% 100% 100%  Weight:      Height:       SpO2: 100 %   Intake/Output Summary (Last 24 hours) at 06/10/2020 1041 Last data filed at 06/10/2020 1033 Gross per 24 hour  Intake 720 ml  Output 1050 ml  Net -330 ml   Filed Weights   06/06/20 0300 06/08/20 0534 06/09/20 0349  Weight: 55.6 kg 50.7 kg 50.7 kg    Exam: General exam: In no acute distress. Respiratory system: Good air movement and clear to auscultation. Cardiovascular system: S1 & S2 heard, RRR. No JVD. Gastrointestinal system: Abdomen is nondistended, soft and nontender.  Extremities: No pedal edema. Skin: No rashes, lesions or ulcers Psychiatry: Judgement and insight appear normal. Mood & affect appropriate.   Data Reviewed:  Labs: Basic Metabolic Panel: Recent Labs  Lab 06/06/20 0321 06/06/20 1648 06/07/20 0458 06/07/20 0754 06/07/20 1649 06/08/20 0146 06/09/20 0456 06/10/20 0311  NA 132*  --  136 137  --  136 133* 137  K 3.9  --  3.6 3.6  --  3.5 3.6 3.5  CL 102  --  104 105  --  107 104 105  CO2 24  --  25 27  --  _0 GLUCOSE 86  --  95 96  --  101* 89 83  BUN 11  --  6 5*  --  5* <5* <5*  CREATININE 0.38*  --  0.41* 0.45  --  0.40* 0.38* 0.34*  CALCIUM 7.8*  --  7.9* 7.9*  --  7.8* 7.7* 7.9*  MG 2.3 2.0 2.0  --  2.0 1.9  --   --   PHOS 3.3 3.0 2.7  --  2.8 2.5  --   --    GFR Estimated Creatinine Clearance: 88.7 mL/min (A) (by C-G formula based on SCr of 0.34 mg/dL (L)). Liver Function Tests: Recent Labs  Lab 06/07/20 0458 06/07/20 0754 06/08/20 0146 06/09/20 0456  06/10/20 0311  AST 218* 237* 280* 167* 155*  ALT 82* 87* 122* 93* 94*  ALKPHOS 193* 183* 188* 142* 153*  BILITOT 0.8 0.8 0.9 QUANTITY NOT SUFFICIENT, UNABLE TO PERFORM TEST 0.6  PROT 6.5 6.7 6.5 6.2* 6.2*  ALBUMIN 1.9* 1.9* 1.9* 1.7* 1.7*   Recent Labs  Lab 06/03/20 1147  AMYLASE 21*   No results for input(s): AMMONIA in the last 168 hours. Coagulation profile Recent Labs  Lab 06/06/20 0321 06/07/20 0458 06/08/20 0146 06/09/20 0456 06/10/20 0311  INR 1.1 1.1 1.2 1.2 1.1   COVID-19 Labs  Recent Labs    06/08/20 1312  FERRITIN 541*    Lab Results  Component Value Date   SARSCOV2NAA NEGATIVE 06/03/2020   [place] NEGATIVE 04/09/2020    CBC: Recent Labs  Lab 06/03/20 1101 06/03/20 1110 06/04/20 2142 06/05/20 0415 06/06/20 0321 06/07/20 0458 06/08/20 0146 06/09/20 0456 06/10/20 0311  WBC  --    < > 4.4  4.4   < > 5.2 5.5 5.6 5.1 3.8*  NEUTROABS 1.8  --  2.4  --   --   --   --   --   --   HGB  --    < > 7.8*  7.8*   < > 9.2* 10.1* 9.3* 9.2* 9.0*  HCT  --    < > 23.5*  24.1*   < > 27.2* 29.2* 28.6* 27.9* 28.6*  MCV  --    < > 91.4  93.1   < > 91.3 92.1 94.1 93.0 96.0  PLT  --    < > 152  157   < > 174 168 130* 92* 104*   < > = values in this interval not displayed.   Cardiac Enzymes: Recent Labs  Lab 06/03/20 1101 06/07/20 0754  CKTOTAL 29* 20*   BNP (last 3 results) No results for input(s): PROBNP in the last 8760 hours. CBG: Recent Labs  Lab 06/09/20 1149 06/09/20 1543 06/10/20 0008 06/10/20 0529 06/10/20 0750  GLUCAP 143* 112* 114* 81 88   D-Dimer: No results for input(s): DDIMER in the last 72 hours. Hgb A1c: No results for input(s): HGBA1C in the last 72 hours. Lipid Profile: No results for input(s): CHOL, HDL, LDLCALC, TRIG, CHOLHDL, LDLDIRECT in the last 72 hours. Thyroid function studies: No  results for input(s): TSH, T4TOTAL, T3FREE, THYROIDAB in the last 72 hours.  Invalid input(s): FREET3 Anemia work up: Recent Labs     06/08/20 1312  FERRITIN 541*  TIBC 192*  IRON 28   Sepsis Labs: Recent Labs  Lab 06/07/20 0458 06/08/20 0146 06/09/20 0456 06/10/20 0311  WBC 5.5 5.6 5.1 3.8*   Microbiology Recent Results (from the past 240 hour(s))  SARS CORONAVIRUS 2 (TAT 6-24 HRS) Nasopharyngeal Nasopharyngeal Swab     Status: None   Collection Time: 06/03/20 12:22 AM   Specimen: Nasopharyngeal Swab  Result Value Ref Range Status   SARS Coronavirus 2 NEGATIVE NEGATIVE Final    Comment: (NOTE) SARS-CoV-2 target nucleic acids are NOT DETECTED.  The SARS-CoV-2 RNA is generally detectable in upper and lower respiratory specimens during the acute phase of infection. Negative results do not preclude SARS-CoV-2 infection, do not rule out co-infections with other pathogens, and should not be used as the sole basis for treatment or other patient management decisions. Negative results must be combined with clinical observations, patient history, and epidemiological information. The expected result is Negative.  Fact Sheet for Patients: SugarRoll.be  Fact Sheet for Healthcare Providers: https://www.woods-mathews.com/  This test is not yet approved or cleared by the [place] FDA and  has been authorized for detection and/or diagnosis of SARS-CoV-2 by FDA under an Emergency Use Authorization (EUA). This EUA will remain  in effect (meaning this test can be used) for the duration of the COVID-19 declaration under Se ction 564(b)(1) of the Act, 21 U.S.C. section 360bbb-3(b)(1), unless the authorization is terminated or revoked sooner.  Performed at [facility], [place] [address], [place], [place] [address]   Culture, blood (routine x 2)     Status: None (Preliminary result)   Collection Time: 06/08/20  1:46 AM   Specimen: BLOOD RIGHT HAND  Result Value Ref Range Status   Specimen Description BLOOD RIGHT HAND  Final   Special Requests   Final    BOTTLES  DRAWN AEROBIC AND ANAEROBIC Blood Culture adequate volume   Culture   Final    NO GROWTH 2 DAYS Performed at [facility], [place] [address], [place], [place] [address]    Report Status PENDING  Incomplete  Culture, blood (routine x 2)     Status: None (Preliminary result)   Collection Time: 06/08/20  1:56 AM   Specimen: BLOOD  Result Value Ref Range Status   Specimen Description BLOOD RIGHT ANTECUBITAL  Final   Special Requests   Final    BOTTLES DRAWN AEROBIC AND ANAEROBIC Blood Culture results may not be optimal due to an inadequate volume of blood received in culture bottles   Culture   Final    NO GROWTH 2 DAYS Performed at [facility], [place] [address] [place], [place] [address]    Report Status PENDING  Incomplete     Medications:   . B-complex with vitamin C  1 tablet Oral Daily  . Chlorhexidine Gluconate Cloth  6 each Topical Daily  . cholecalciferol  1,000 Units Oral Daily  . levothyroxine  75 mcg Oral QAC breakfast  . mirtazapine  30 mg Oral QHS  . multivitamin with minerals  1 tablet Oral Daily  . pantoprazole  40 mg Oral QHS  . phytonadione  5 mg Oral Daily  . predniSONE  40 mg Oral Q breakfast  . thiamine  100 mg Oral Daily   Continuous Infusions: . sodium chloride 10 mL/hr at  06/06/20 1600  . feeding supplement (OSMOLITE 1.2 CAL)        LOS: 7 days   Charlynne Cousins  [facility]  06/10/2020, 10:41 AM

## 2020-06-11 ENCOUNTER — Other Ambulatory Visit (HOSPITAL_COMMUNITY): Payer: Self-pay | Admitting: *Deleted

## 2020-06-11 DIAGNOSIS — I609 Nontraumatic subarachnoid hemorrhage, unspecified: Secondary | ICD-10-CM | POA: Diagnosis not present

## 2020-06-11 LAB — CBC
HCT: 30 % — ABNORMAL LOW (ref 36.0–46.0)
Hemoglobin: 9.9 g/dL — ABNORMAL LOW (ref 12.0–15.0)
MCH: 31 pg (ref 26.0–34.0)
MCHC: 33 g/dL (ref 30.0–36.0)
MCV: 94 fL (ref 80.0–100.0)
Platelets: 149 10*3/uL — ABNORMAL LOW (ref 150–400)
RBC: 3.19 MIL/uL — ABNORMAL LOW (ref 3.87–5.11)
RDW: 18.6 % — ABNORMAL HIGH (ref 11.5–15.5)
WBC: 4.6 10*3/uL (ref 4.0–10.5)
nRBC: 0 % (ref 0.0–0.2)

## 2020-06-11 LAB — PROTIME-INR
INR: 1.2 (ref 0.8–1.2)
Prothrombin Time: 14.5 seconds (ref 11.4–15.2)

## 2020-06-11 LAB — COMPREHENSIVE METABOLIC PANEL
ALT: 97 U/L — ABNORMAL HIGH (ref 0–44)
AST: 147 U/L — ABNORMAL HIGH (ref 15–41)
Albumin: 2 g/dL — ABNORMAL LOW (ref 3.5–5.0)
Alkaline Phosphatase: 161 U/L — ABNORMAL HIGH (ref 38–126)
Anion gap: 7 (ref 5–15)
BUN: 5 mg/dL — ABNORMAL LOW (ref 6–20)
CO2: 27 mmol/L (ref 22–32)
Calcium: 8.2 mg/dL — ABNORMAL LOW (ref 8.9–10.3)
Chloride: 103 mmol/L (ref 98–111)
Creatinine, Ser: 0.46 mg/dL (ref 0.44–1.00)
GFR, Estimated: >60 mL/min (ref >60)
Glucose, Bld: 85 mg/dL (ref 70–99)
Potassium: 4 mmol/L (ref 3.5–5.1)
Sodium: 137 mmol/L (ref 135–145)
Total Bilirubin: 0.9 mg/dL (ref 0.3–1.2)
Total Protein: 6.6 g/dL (ref 6.5–8.1)

## 2020-06-11 LAB — APTT: aPTT: 39 seconds — ABNORMAL HIGH (ref 24–36)

## 2020-06-11 MED ORDER — HYPROMELLOSE (GONIOSCOPIC) 2.5 % OP SOLN
1.0000 [drp] | Freq: Three times a day (TID) | OPHTHALMIC | Status: DC
  Administered 2020-06-11 – 2020-06-16 (×13): 1 [drp] via OPHTHALMIC
  Filled 2020-06-11: qty 15

## 2020-06-11 NOTE — Progress Notes (Signed)
PROGRESS NOTE   Jessica Frazier  OZD:[id number] DOB: [birth year] DOA: 06/02/2020 PCP: Pcp, No  Brief Narrative:  20 year old female Hypothyroidism followed by Dr. Amedeo Kinsman Bell's palsy 09/02/2018 Rheumatoid arthritis previously on Rinvoq with transaminitis secondary to this 04/09/2020 Readmitted 06/02/2020 platelets 61 hemoglobin 9.6 and black-colored stool CTA chest CT abdomen pelvis showed severe fatty liver possible steatohepatitis  In the emergency room mechanical fall with multifocal acute subarachnoid blood and large parietal hematoma INR was 2.6 patient given FFP-FFP stopped Repeat CT head = multifocal new intracranial hemorrhage Neurosurgery consulted Hematology consulted, GI consulted, psychiatry consulted  Hospital-Problem based course  Traumatic subarachnoid subdural hemorrhages in setting of coagulopathy thrombocytopenia Neurosurgery continues to follow recommends medical management-input appreciated Keep delirium precautions and minimize CNS meds Coagulopathy uncertain cause Pancytopenia now resolving?  Autoimmune disease-BM biopsy no blasts on final smear Path is pending Psychiatry signed off and do not feel she has an eating disorder Vitamin K has been recommended by hematology as well as steroids ending 06/16/2020 or taper  they have signed off and I will CC Dr. Irene Limbo for what type of follow-up he needs Fatty liver ?  Secondary to updacatinib Defer to GI F-actin IgG is 29 raising concern for autoimmune hepatitis I will defer to GI next steps LFTs have trended downward and bili is now 0.9 Continue steroids 40 mg prednisone Seronegative rheumatoid arthritis followed by Marella Chimes PA with [facility] Rheum  ANA positive LDH 341 factor VIII 180 factor V assay 152 factor X 104 anticardiolipin positive IgM of 115 IgG 10 Outpatient further rheumatological follow-up I called and discussed 3/30 with Marella Chimes, PA at [facility] rheumatology for recommendations and awaiting  callback Acute kidney injury Monitor trends Depression Continue Prozac started this admission Sick euthyroid Continue Synthroid   DVT prophylaxis: SCD Code Status: Full Family Communication:  Disposition:  Status is: Inpatient  Remains inpatient appropriate because:Hemodynamically unstable, Persistent severe electrolyte disturbances and Unsafe d/c plan   Dispo: The patient is from: Home              Anticipated d/c is to: Unclear at this time              Patient currently is not medically stable to d/c.   Difficult to place patient No       Consultants:   Multiple as above  Procedures:   Significant Events: 06/03/2000 admitted to the ICU for subarachnoid, subdural hemorrhage.     Significant studies: 06/03/2020 CT scan of the abdomen and pelvis showed fatty liver with hepatic steatosis small ascites. 05/14/2020 CT of the head new multifocal intracranial hemorrhage with expanding left scalp hematoma and new left-sided subdural hematoma, mild subarachnoid hemorrhage, with small midline shift. 06/04/2020 bone marrow biopsy  Antimicrobials: None currently   Subjective: Awake coherent in nad  ambulatory eating some better No focal deficit She is a little slow to speak  Objective: Vitals:   06/11/20 0921 06/11/20 1149 06/11/20 1149 06/11/20 1532  BP: 117/66 119/74 119/74 111/78  Pulse: (!) 126 (!) 109 (!) 109 95  Resp: _0 Temp: (!) 100.5 F (38.1 C) 98.3 F (36.8 C) 98.3 F (36.8 C) 97.7 F (36.5 C)  TempSrc: Oral Oral Oral Oral  SpO2: 97% 98% 98% 100%  Weight:      Height:        Intake/Output Summary (Last 24 hours) at 06/11/2020 1542 Last data filed at 06/11/2020 0615 Gross per 24 hour  Intake 120 ml  Output 1250 ml  Net -1130 ml   Filed Weights   06/08/20 0534 06/09/20 0349 06/11/20 0335  Weight: 50.7 kg 50.7 kg 53.9 kg    Examination:  eomi ncat no focal defciit In nad S1-S2 no murmur no rub no gallop CTA B Quite asthenic No  lower extremity edema   Data Reviewed: personally reviewed   CBC    Component Value Date/Time   WBC 4.6 06/11/2020 0512   RBC 3.19 (L) 06/11/2020 0512   HGB 9.9 (L) 06/11/2020 0512   HCT 30.0 (L) 06/11/2020 0512   PLT 149 (L) 06/11/2020 0512   MCV 94.0 06/11/2020 0512   MCH 31.0 06/11/2020 0512   MCHC 33.0 06/11/2020 0512   RDW 18.6 (H) 06/11/2020 0512   LYMPHSABS 1.5 06/04/2020 2142   MONOABS 0.4 06/04/2020 2142   EOSABS 0.0 06/04/2020 2142   BASOSABS 0.0 06/04/2020 2142   CMP Latest Ref Rng & Units 06/11/2020 06/10/2020 06/09/2020  Glucose 70 - 99 mg/dL 85 83 89  BUN 6 - 20 mg/dL 5(L) <5(L) <5(L)  Creatinine 0.44 - 1.00 mg/dL 0.46 0.34(L) 0.38(L)  Sodium 135 - 145 mmol/L 137 137 133(L)  Potassium 3.5 - 5.1 mmol/L 4.0 3.5 3.6  Chloride 98 - 111 mmol/L 103 105 104  CO2 22 - 32 mmol/L _0 Calcium 8.9 - 10.3 mg/dL 8.2(L) 7.9(L) 7.7(L)  Total Protein 6.5 - 8.1 g/dL 6.6 6.2(L) 6.2(L)  Total Bilirubin 0.3 - 1.2 mg/dL 0.9 0.6 QUANTITY NOT SUFFICIENT, UNABLE TO PERFORM TEST  Alkaline Phos 38 - 126 U/L 161(H) 153(H) 142(H)  AST 15 - 41 U/L 147(H) 155(H) 167(H)  ALT 0 - 44 U/L 97(H) 94(H) 93(H)     Radiology Studies: No results found.   Scheduled Meds: . B-complex with vitamin C  1 tablet Oral Daily  . Chlorhexidine Gluconate Cloth  6 each Topical Daily  . cholecalciferol  1,000 Units Oral Daily  . feeding supplement  237 mL Oral TID BM  . hydroxypropyl methylcellulose / hypromellose  1 drop Both Eyes TID  . levothyroxine  75 mcg Oral QAC breakfast  . mirtazapine  30 mg Oral QHS  . multivitamin with minerals  1 tablet Oral Daily  . pantoprazole  40 mg Oral QHS  . phytonadione  5 mg Oral Daily  . predniSONE  40 mg Oral Q breakfast  . thiamine  100 mg Oral Daily   Continuous Infusions: . sodium chloride 10 mL/hr at 06/06/20 1600     LOS: 8 days   Time spent: Vick[place], MD [facility] To contact the attending provider between 7A-7P or the  covering provider during after hours 7P-7A, please log into the web site [email or web address] and access using universal [place] password for that web site. If you do not have the password, please call the hospital operator.  06/11/2020, 3:42 PM

## 2020-06-11 NOTE — Progress Notes (Incomplete)
Subjective: Since I last evaluated the patient ***  Objective: Vital signs in last 24 hours: Temp:  [97.5 F (36.4 C)-100.7 F (38.2 C)] 98.3 F (36.8 C) (03/30 1149) Pulse Rate:  [86-126] 109 (03/30 1149) Resp:  [16-19] 16 (03/30 1149) BP: (113-126)/(61-84) 119/74 (03/30 1149) SpO2:  [93 %-100 %] 98 % (03/30 1149) Weight:  [53.9 kg] 53.9 kg (03/30 0335) Last BM Date: 06/08/20  Intake/Output from previous day: 03/29 0701 - 03/30 0700 In: 1077 [P.O.:1077] Out: 1250 [Urine:1250] Intake/Output this shift: No intake/output data recorded.  {ZJ:[id number]}  Lab Results: Recent Labs    06/09/20 0456 06/10/20 0311 06/11/20 0512  WBC 5.1 3.8* 4.6  HGB 9.2* 9.0* 9.9*  HCT 27.9* 28.6* 30.0*  PLT 92* 104* 149*   BMET Recent Labs    06/09/20 0456 06/10/20 0311 06/11/20 0512  NA 133* 137 137  K 3.6 3.5 4.0  CL 104 105 103  CO2 23 27 27   GLUCOSE 89 83 85  BUN <5* <5* 5*  CREATININE 0.38* 0.34* 0.46  CALCIUM 7.7* 7.9* 8.2*   LFT Recent Labs    06/11/20 0512  PROT 6.6  ALBUMIN 2.0*  AST 147*  ALT 97*  ALKPHOS 161*  BILITOT 0.9   PT/INR Recent Labs    06/10/20 0311 06/11/20 0512  LABPROT 14.1 14.5  INR 1.1 1.2   Hepatitis Panel No results for input(s): HEPBSAG, HCVAB, HEPAIGM, HEPBIGM in the last 72 hours. C-Diff No results for input(s): CDIFFTOX in the last 72 hours. No results for input(s): CDIFFPCR in the last 72 hours. Fecal Lactopherrin No results for input(s): FECLLACTOFRN in the last 72 hours.  Studies/Results: No results found.  Medications: {medication reviewed/display:[id number]}  Assessment/Plan: ***  LOS: 8 days   Juanita Craver 06/11/2020, 2:56 PM

## 2020-06-11 NOTE — Plan of Care (Signed)

## 2020-06-11 NOTE — Plan of Care (Signed)

## 2020-06-11 NOTE — Progress Notes (Signed)
Inpatient Rehab Admissions Coordinator Note:   Per updated PT recommendations, pt was screened for CIR candidacy by Shann Medal, PT, DPT.  At this time we are recommending a CIR consult and I will place an order per our protocol.  Please contact me with questions.   Shann Medal, PT, DPT [phone] 06/11/20 3:45 PM

## 2020-06-11 NOTE — Progress Notes (Signed)
Occupational Therapy Treatment Patient Details Name: Jessica Frazier MRN: [id number] DOB: Jul 29, 2000 Today's Date: 06/11/2020    History of present illness 20 y/o female presented to ED on 3/21 with chief complaint of abdominal pain and black stools. In ED, patient fell in bathroom and hit head. Head CT found multifocal acute subarachnoid hemmorhage and L parietal hematoma without skull fx. Repeat head CT showed L hypodense subdural hematoma with new midline shift. Evaluating for coagulopathy. PMH: hypothyroidism, RA   OT comments  Patient progressing well.  Completing bed mobility with min assist +2, transfer and in room mobility with min assist +2.  Patient guarded with mobility and fearful of falling, but demonstrating greatly improved strength, tolerance and balance today.  She requires mod assist for UB dressing and setup for grooming seated.  Increased time for all task processing and initiation, fatigues easily.  Updated dc plan to CIR. Will follow acutely.     Follow Up Recommendations  Supervision/Assistance - 24 hour;CIR    Equipment Recommendations  Other (comment) (TBD at next venue of care)    Recommendations for Other Services      Precautions / Restrictions Precautions Precautions: Fall Restrictions Weight Bearing Restrictions: No       Mobility Bed Mobility Overal bed mobility: Needs Assistance Bed Mobility: Supine to Sit     Supine to sit: Min assist;+2 for physical assistance;+2 for safety/equipment     General bed mobility comments: minA+2 for repositioning of hips with use of bed pads and trunk elevation    Transfers Overall transfer level: Needs assistance Equipment used: 2 person hand held assist Transfers: Sit to/from Stand Sit to Stand: Min assist;+2 physical assistance;+2 safety/equipment         General transfer comment: minA+2 for boost up to standing and steadying with HHAx2    Balance Overall balance assessment: Needs  assistance Sitting-balance support: Bilateral upper extremity supported Sitting balance-Leahy Scale: Fair Sitting balance - Comments: min guard-minA for static sitting EOB with UE support. Multidirectional sway throughout   Standing balance support: Bilateral upper extremity supported Standing balance-Leahy Scale: Poor Standing balance comment: Reliant on UE support and external assist                           ADL either performed or assessed with clinical judgement   ADL Overall ADL's : Needs assistance/impaired     Grooming: Set up;Sitting;Oral care Grooming Details (indicate cue type and reason): seated in recliner to brush teeth with supervision         Upper Body Dressing : Moderate assistance;Sitting       Toilet Transfer: Minimal assistance;+2 for physical assistance;+2 for safety/equipment;Ambulation Toilet Transfer Details (indicate cue type and reason): simulated to recliner         Functional mobility during ADLs: Minimal assistance;+2 for physical assistance;+2 for safety/equipment;Cueing for safety       Vision       Perception     Praxis      Cognition Arousal/Alertness: Awake/alert Behavior During Therapy: Flat affect Overall Cognitive Status: Impaired/Different from baseline Area of Impairment: Memory;Following commands;Awareness;Problem solving                     Memory: Decreased short-term memory Following Commands: Follows one step commands inconsistently;Follows one step commands with increased time Safety/Judgement: Decreased awareness of deficits Awareness: Emergent Problem Solving: Slow processing;Decreased initiation;Difficulty sequencing;Requires verbal cues;Requires tactile cues General Comments: patient with flat affect, requires increased  time to process, initate, and follow commands. fearful of falling and OOB mobility.        Exercises     Shoulder Instructions       General Comments mother present  and supportive    Pertinent Vitals/ Pain       Pain Assessment: Faces Faces Pain Scale: Hurts a little bit Pain Location: generalized movement Pain Descriptors / Indicators: Grimacing;Discomfort Pain Intervention(s): Monitored during session  Home Living                                          Prior Functioning/Environment              Frequency  Min 2X/week        Progress Toward Goals  OT Goals(current goals can now be found in the care plan section)     Acute Rehab OT Goals Patient Stated Goal: to get better OT Goal Formulation: With patient  Plan Frequency remains appropriate;Discharge plan needs to be updated    Co-evaluation    PT/OT/SLP Co-Evaluation/Treatment: Yes Reason for Co-Treatment: For patient/therapist safety;To address functional/ADL transfers PT goals addressed during session: Mobility/safety with mobility;Balance OT goals addressed during session: ADL's and self-care      AM-PAC OT "6 Clicks" Daily Activity     Outcome Measure   Help from another person eating meals?: A Little Help from another person taking care of personal grooming?: A Little Help from another person toileting, which includes using toliet, bedpan, or urinal?: A Lot Help from another person bathing (including washing, rinsing, drying)?: A Lot Help from another person to put on and taking off regular upper body clothing?: A Lot Help from another person to put on and taking off regular lower body clothing?: A Lot 6 Click Score: 14    End of Session Equipment Utilized During Treatment: Gait belt  OT Visit Diagnosis: Unsteadiness on feet (R26.81);Muscle weakness (generalized) (M62.81);Pain;Other symptoms and signs involving cognitive function;Hemiplegia and hemiparesis Hemiplegia - Right/Left: Right Hemiplegia - dominant/non-dominant: Dominant Hemiplegia - caused by: Unspecified   Activity Tolerance Patient tolerated treatment well   Patient Left in  chair;with call bell/phone within reach;with chair alarm set;with family/visitor present   Nurse Communication Mobility status        Time: 1346-1419 OT Time Calculation (min): 33 min  Charges: OT General Charges $OT Visit: 1 Visit OT Treatments $Self Care/Home Management : 8-22 mins  Jolaine Artist, [place] Pager [phone] Office [place] 06/11/2020, 4:04 PM

## 2020-06-11 NOTE — Progress Notes (Signed)
   Providing Compassionate, Quality Care - Together   Subjective: Patient reports intermittent headaches, but overall is feeling "much better."  Objective: Vital signs in last 24 hours: Temp:  [97.5 F (36.4 C)-100.7 F (38.2 C)] 100.5 F (38.1 C) (03/30 0921) Pulse Rate:  [86-126] 126 (03/30 0921) Resp:  [16-19] 19 (03/30 0921) BP: (113-126)/(61-84) 117/66 (03/30 0921) SpO2:  [93 %-100 %] 97 % (03/30 0921) Weight:  [53.9 kg] 53.9 kg (03/30 0335)  Intake/Output from previous day: 03/29 0701 - 03/30 0700 In: 1077 [P.O.:1077] Out: 1250 [Urine:1250] Intake/Output this shift: No intake/output data recorded.  Alert and oriented x 4 PERRLA Speech, clear, fluent CN II-XII grossly intact MAE, Strength and sensation intact   Lab Results: Recent Labs    06/10/20 0311 06/11/20 0512  WBC 3.8* 4.6  HGB 9.0* 9.9*  HCT 28.6* 30.0*  PLT 104* 149*   BMET Recent Labs    06/10/20 0311 06/11/20 0512  NA 137 137  K 3.5 4.0  CL 105 103  CO2 27 27  GLUCOSE 83 85  BUN <5* 5*  CREATININE 0.34* 0.46  CALCIUM 7.9* 8.2*    Studies/Results: No results found.  Assessment/Plan: Patient presented to the emergency department on the evening of 06/02/2020 with vague GI symptoms. She has a history of RA and prior transaminitis believed to be related to Rinvoq, which was discontinued. Her lab work in the ED demonstrated pancytopenia. She fell while in the ED, striking her head. CT head showed a small amount of subarachnoid hemorrhage along the convexities bilaterally. She remained neuro intact. Due to patient's coagulopathy, her head was rescanned. The second CT head showed worsening of the hemorrhage. Patient continues to remain neuro intact.Follow up CT showed continued expansion of hemorrhage, but surgery was not recommended due to coagulopathy. Patient has slowly improved throughout her admission. Her neurological exam appears at baseline.   LOS: 8 days    -Continue supportive care.  No new Neurosurgical recommendations at this time.   Viona Gilmore, DNP, AGNP-C Nurse Practitioner  [facility] [place] [address] [place], [place],  [address] P: [phone]    F: [phone]  06/11/2020, 10:19 AM

## 2020-06-11 NOTE — Progress Notes (Addendum)
Physical Therapy Treatment Patient Details Name: Jessica Frazier MRN: [id number] DOB: 12-19-2000 Today's Date: 06/11/2020    History of Present Illness 20 y/o female presented to ED on 3/21 with chief complaint of abdominal pain and black stools. In ED, patient fell in bathroom and hit head. Head CT found multifocal acute subarachnoid hemmorhage and L parietal hematoma without skull fx. Repeat head CT showed L hypodense subdural hematoma with new midline shift. Evaluating for coagulopathy. PMH: hypothyroidism, RA    PT Comments    Patient progressing towards physical therapy goals. Patient ambulated 58' with minA+2 and HHAx2. Patient with reports of fear falling since recent fall, PT/OT provided comfort and support with patient appreciative. Patient with improved affect this session,however continues to demonstrate difficulty processing and sequencing. D/c recommendation updated to CIR as patient would benefit from intensive therapy to assist with return to PLOF.     Follow Up Recommendations  CIR     Equipment Recommendations  Wheelchair (measurements PT);Wheelchair cushion (measurements PT);Rolling [place]ane Speas with 5" wheels    Recommendations for Other Services Rehab consult     Precautions / Restrictions Precautions Precautions: Fall Restrictions Weight Bearing Restrictions: No    Mobility  Bed Mobility Overal bed mobility: Needs Assistance Bed Mobility: Supine to Sit     Supine to sit: Min assist;+2 for physical assistance;+2 for safety/equipment     General bed mobility comments: minA+2 for repositioning of hips with use of bed pads and trunk elevation    Transfers Overall transfer level: Needs assistance Equipment used: 2 person hand held assist Transfers: Sit to/from Stand Sit to Stand: Min assist;+2 physical assistance;+2 safety/equipment         General transfer comment: minA+2 for boost up to standing and steadying with  HHAx2  Ambulation/Gait Ambulation/Gait assistance: Min assist;+2 physical assistance;+2 safety/equipment Gait Distance (Feet): 25 Feet Assistive device: 2 person hand held assist Gait Pattern/deviations: Step-to pattern;Decreased stride length;Ataxic;Drifts right/left;Narrow base of support;Decreased step length - right;Decreased step length - left Gait velocity: decreased   General Gait Details: slow step to pattern with minA+2 for balance. Patient fearful with ambulation due to recent fall. Able to navigate through uncontrolled environment with obstacles   Stairs             Wheelchair Mobility    Modified Rankin (Stroke Patients Only) Modified Rankin (Stroke Patients Only) Pre-Morbid Rankin Score: No significant disability Modified Rankin: Moderately severe disability     Balance Overall balance assessment: Needs assistance Sitting-balance support: Bilateral upper extremity supported Sitting balance-Leahy Scale: Fair Sitting balance - Comments: min guard-minA for static sitting EOB with UE support. Multidirectional sway throughout   Standing balance support: Bilateral upper extremity supported Standing balance-Leahy Scale: Poor Standing balance comment: Reliant on UE support and external assist                            Cognition Arousal/Alertness: Awake/alert Behavior During Therapy: Flat affect Overall Cognitive Status: Impaired/Different from baseline Area of Impairment: Memory;Following commands;Awareness;Problem solving                     Memory: Decreased short-term memory Following Commands: Follows one step commands inconsistently;Follows one step commands with increased time   Awareness: Emergent Problem Solving: Slow processing;Decreased initiation;Difficulty sequencing;Requires verbal cues;Requires tactile cues General Comments: Improved affect compared to previous sessions. Fearful of falling and OOB mobility. Patient with slow  processing and difficulty problem solving initially  Exercises      General Comments General comments (skin integrity, edema, etc.): Mother present and supportive      Pertinent Vitals/Pain Pain Assessment: Faces Faces Pain Scale: Hurts a little bit Pain Location: generalized movement Pain Descriptors / Indicators: Grimacing;Discomfort Pain Intervention(s): Monitored during session;Repositioned    Home Living                      Prior Function            PT Goals (current goals can now be found in the care plan section) Acute Rehab PT Goals Patient Stated Goal: to get better PT Goal Formulation: With patient Time For Goal Achievement: 06/21/20 Potential to Achieve Goals: Fair Progress towards PT goals: Progressing toward goals    Frequency    Min 4X/week      PT Plan Discharge plan needs to be updated;Frequency needs to be updated    Co-evaluation PT/OT/SLP Co-Evaluation/Treatment: Yes Reason for Co-Treatment: For patient/therapist safety;To address functional/ADL transfers;Necessary to address cognition/behavior during functional activity PT goals addressed during session: Mobility/safety with mobility;Balance        AM-PAC PT "6 Clicks" Mobility   Outcome Measure  Help needed turning from your back to your side while in a flat bed without using bedrails?: A Little Help needed moving from lying on your back to sitting on the side of a flat bed without using bedrails?: A Little Help needed moving to and from a bed to a chair (including a wheelchair)?: A Little Help needed standing up from a chair using your arms (e.g., wheelchair or bedside chair)?: A Little Help needed to walk in hospital room?: A Little Help needed climbing 3-5 steps with a railing? : A Lot 6 Click Score: 17    End of Session Equipment Utilized During Treatment: Gait belt Activity Tolerance: Patient tolerated treatment well Patient left: in chair;with call bell/phone  within reach;with chair alarm set Nurse Communication: Mobility status PT Visit Diagnosis: Unsteadiness on feet (R26.81);Other abnormalities of gait and mobility (R26.89);Muscle weakness (generalized) (M62.81);History of falling (Z91.81);Other symptoms and signs involving the nervous system (R29.898)     Time: 9735-3299 PT Time Calculation (min) (ACUTE ONLY): 33 min  Charges:  $Gait Training: 8-22 mins                     Kaiyon Hynes A. Gilford Rile PT, DPT Acute Rehabilitation Services Pager [phone] Office [phone]    Linna Hoff 06/11/2020, 3:05 PM

## 2020-06-12 ENCOUNTER — Ambulatory Visit: Payer: Self-pay | Admitting: Physician Assistant

## 2020-06-12 ENCOUNTER — Inpatient Hospital Stay (HOSPITAL_COMMUNITY): Payer: 59

## 2020-06-12 ENCOUNTER — Encounter (HOSPITAL_COMMUNITY): Payer: Self-pay

## 2020-06-12 DIAGNOSIS — I609 Nontraumatic subarachnoid hemorrhage, unspecified: Secondary | ICD-10-CM | POA: Diagnosis not present

## 2020-06-12 LAB — COMPREHENSIVE METABOLIC PANEL
ALT: 117 U/L — ABNORMAL HIGH (ref 0–44)
AST: 167 U/L — ABNORMAL HIGH (ref 15–41)
Albumin: 2 g/dL — ABNORMAL LOW (ref 3.5–5.0)
Alkaline Phosphatase: 175 U/L — ABNORMAL HIGH (ref 38–126)
Anion gap: 5 (ref 5–15)
BUN: 8 mg/dL (ref 6–20)
CO2: 29 mmol/L (ref 22–32)
Calcium: 8.3 mg/dL — ABNORMAL LOW (ref 8.9–10.3)
Chloride: 103 mmol/L (ref 98–111)
Creatinine, Ser: 0.37 mg/dL — ABNORMAL LOW (ref 0.44–1.00)
GFR, Estimated: >60 mL/min (ref >60)
Glucose, Bld: 80 mg/dL (ref 70–99)
Potassium: 3.8 mmol/L (ref 3.5–5.1)
Sodium: 137 mmol/L (ref 135–145)
Total Bilirubin: 0.8 mg/dL (ref 0.3–1.2)
Total Protein: 6.7 g/dL (ref 6.5–8.1)

## 2020-06-12 LAB — PROTIME-INR
INR: 1.1 (ref 0.8–1.2)
Prothrombin Time: 13.7 seconds (ref 11.4–15.2)

## 2020-06-12 LAB — APTT: aPTT: 37 seconds — ABNORMAL HIGH (ref 24–36)

## 2020-06-12 LAB — SOLUBLE LIVER AG (IGG AB): Anti-SLA, IgG: 1.2 units (ref 0.0–20.0)

## 2020-06-12 LAB — VITAMIN A: Vitamin A (Retinoic Acid): 21.9 ug/dL (ref 18.9–57.3)

## 2020-06-12 LAB — VITAMIN C: Vitamin C: 0.4 mg/dL (ref 0.4–2.0)

## 2020-06-12 MED ORDER — FENTANYL CITRATE (PF) 100 MCG/2ML IJ SOLN
INTRAMUSCULAR | Status: AC | PRN
  Administered 2020-06-12 (×2): 25 ug via INTRAVENOUS

## 2020-06-12 MED ORDER — SODIUM CHLORIDE 0.9 % IV SOLN
INTRAVENOUS | Status: AC | PRN
  Administered 2020-06-12: 50 mL/h via INTRAVENOUS

## 2020-06-12 MED ORDER — MIDAZOLAM HCL 2 MG/2ML IJ SOLN
INTRAMUSCULAR | Status: AC | PRN
  Administered 2020-06-12 (×2): 0.5 mg via INTRAVENOUS

## 2020-06-12 MED ORDER — MIDAZOLAM HCL 2 MG/2ML IJ SOLN
INTRAMUSCULAR | Status: AC
  Filled 2020-06-12: qty 4

## 2020-06-12 MED ORDER — LIDOCAINE HCL (PF) 1 % IJ SOLN
INTRAMUSCULAR | Status: AC
  Filled 2020-06-12: qty 30

## 2020-06-12 MED ORDER — FENTANYL CITRATE (PF) 100 MCG/2ML IJ SOLN
INTRAMUSCULAR | Status: AC
  Filled 2020-06-12: qty 2

## 2020-06-12 MED ORDER — GELATIN ABSORBABLE 12-7 MM EX MISC
CUTANEOUS | Status: AC
  Filled 2020-06-12: qty 1

## 2020-06-12 NOTE — Progress Notes (Signed)
Physical Therapy Treatment Patient Details Name: Jessica Frazier MRN: [id number] DOB: [birth year] Today's Date: 06/12/2020    History of Present Illness 20 y/o female presented to ED on 3/21 with chief complaint of abdominal pain and black stools. In ED, patient fell in bathroom and hit head. Head CT found multifocal acute subarachnoid hemmorhage and L parietal hematoma without skull fx. Repeat head CT showed L hypodense subdural hematoma with new midline shift. Evaluating for coagulopathy. PMH: hypothyroidism, RA    PT Comments    Patient progressing towards physical therapy goals. Patient ambulated 80' with HHAx2 and minA+2 for balance, demonstrates ataxia with LE advancement. Patient fatigues easily and limited by fear of falling. Patient's boyfriend present and supportive. Continue to recommend comprehensive inpatient rehab (CIR) for post-acute therapy needs.     Follow Up Recommendations  CIR     Equipment Recommendations  Wheelchair (measurements PT);Wheelchair cushion (measurements PT);Rolling Brennley Curtice with 5" wheels    Recommendations for Other Services       Precautions / Restrictions Precautions Precautions: Fall Restrictions Weight Bearing Restrictions: No    Mobility  Bed Mobility Overal bed mobility: Needs Assistance Bed Mobility: Supine to Sit     Supine to sit: Mod assist     General bed mobility comments: modA for trunk elevation and repositioning hips towards EOB    Transfers Overall transfer level: Needs assistance Equipment used: 1 person hand held assist Transfers: Sit to/from Stand Sit to Stand: Min assist         General transfer comment: minA for boost up to standing x 4  Ambulation/Gait Ambulation/Gait assistance: Min assist;+2 physical assistance;+2 safety/equipment Gait Distance (Feet): 50 Feet Assistive device: 2 person hand held assist Gait Pattern/deviations: Step-to pattern;Decreased stride length;Ataxic;Drifts  right/left;Narrow base of support;Decreased step length - right;Decreased step length - left Gait velocity: decreased   General Gait Details: slow step to pattern with minA+2 for balance. Patient fearful with ambulation due to recent fall. Able to navigate through uncontrolled environment with obstacles   Stairs             Wheelchair Mobility    Modified Rankin (Stroke Patients Only) Modified Rankin (Stroke Patients Only) Pre-Morbid Rankin Score: No significant disability Modified Rankin: Moderately severe disability     Balance Overall balance assessment: Needs assistance Sitting-balance support: Bilateral upper extremity supported Sitting balance-Leahy Scale: Fair Sitting balance - Comments: min guard-minA for static sitting EOB with UE support. Multidirectional sway throughout   Standing balance support: Bilateral upper extremity supported Standing balance-Leahy Scale: Poor Standing balance comment: Reliant on UE support and external assist                            Cognition Arousal/Alertness: Awake/alert Behavior During Therapy: Flat affect Overall Cognitive Status: Impaired/Different from baseline Area of Impairment: Memory;Following commands;Awareness;Problem solving;Safety/judgement                     Memory: Decreased short-term memory Following Commands: Follows one step commands inconsistently;Follows one step commands with increased time Safety/Judgement: Decreased awareness of deficits Awareness: Emergent Problem Solving: Slow processing;Decreased initiation;Difficulty sequencing;Requires verbal cues;Requires tactile cues General Comments: patient with flat affect, requires increased time to process, initate, and follow commands. fearful of falling and OOB mobility.      Exercises      General Comments        Pertinent Vitals/Pain Pain Assessment: Faces Faces Pain Scale: Hurts even more Pain Location: generalized movement  and abdomen Pain Descriptors / Indicators: Grimacing;Discomfort Pain Intervention(s): Monitored during session;Repositioned    Home Living                      Prior Function            PT Goals (current goals can now be found in the care plan section) Acute Rehab PT Goals Patient Stated Goal: to get better PT Goal Formulation: With patient Time For Goal Achievement: 06/21/20 Potential to Achieve Goals: Fair Progress towards PT goals: Progressing toward goals    Frequency    Min 4X/week      PT Plan Current plan remains appropriate    Co-evaluation              AM-PAC PT "6 Clicks" Mobility   Outcome Measure  Help needed turning from your back to your side while in a flat bed without using bedrails?: A Lot Help needed moving from lying on your back to sitting on the side of a flat bed without using bedrails?: A Lot Help needed moving to and from a bed to a chair (including a wheelchair)?: A Little Help needed standing up from a chair using your arms (e.g., wheelchair or bedside chair)?: A Little Help needed to walk in hospital room?: A Little Help needed climbing 3-5 steps with a railing? : A Lot 6 Click Score: 15    End of Session Equipment Utilized During Treatment: Gait belt Activity Tolerance: Patient tolerated treatment well Patient left: in chair;with call bell/phone within reach;with chair alarm set Nurse Communication: Mobility status PT Visit Diagnosis: Unsteadiness on feet (R26.81);Other abnormalities of gait and mobility (R26.89);Muscle weakness (generalized) (M62.81);History of falling (Z91.81);Other symptoms and signs involving the nervous system (G01.749)     Time: 4496-7591 PT Time Calculation (min) (ACUTE ONLY): 29 min  Charges:  $Gait Training: 8-22 mins $Therapeutic Activity: 8-22 mins                     Leesha Veno A. Gilford Rile PT, DPT Acute Rehabilitation Services Pager [phone] Office [phone]    Linna Hoff 06/12/2020, 1:16 PM

## 2020-06-12 NOTE — Progress Notes (Signed)
Subjective: The patient is alert and pleasant.  Her boyfriend is at the bedside.  She has no complaints.  Objective: Vital signs in last 24 hours: Temp:  [97.7 F (36.5 C)-99.7 F (37.6 C)] 99.7 F (37.6 C) (03/31 1140) Pulse Rate:  [95-109] 107 (03/31 1140) Resp:  [16-17] 17 (03/31 1140) BP: (111-125)/(74-81) 125/81 (03/31 1140) SpO2:  [97 %-100 %] 99 % (03/31 1140) Estimated body mass index is 21.73 kg/m as calculated from the following:   Height as of this encounter: 5\' 2"  (1.575 m).   Weight as of this encounter: 53.9 kg.   Intake/Output from previous day: 03/30 0701 - 03/31 0700 In: -  Out: 400 [Urine:400] Intake/Output this shift: No intake/output data recorded.  Physical exam the patient is alert and pleasant.  She is moving all 4 extremities.  Lab Results: Recent Labs    06/10/20 0311 06/11/20 0512  WBC 3.8* 4.6  HGB 9.0* 9.9*  HCT 28.6* 30.0*  PLT 104* 149*   BMET Recent Labs    06/11/20 0512 06/12/20 0348  NA 137 137  K 4.0 3.8  CL 103 103  CO2 27 29  GLUCOSE 85 80  BUN 5* 8  CREATININE 0.46 0.37*  CALCIUM 8.2* 8.3*    Studies/Results: No results found.  Assessment/Plan: Coagulopathy, intracerebral hemorrhage: The patient has been clinically stable.  I will sign off.  Please call if I can be of further assistance.  Please have her follow-up with me in the office in about 4 weeks.  LOS: 9 days     Jessica Frazier 06/12/2020, 11:45 AM

## 2020-06-12 NOTE — Consult Note (Shared)
Physical Medicine and Rehabilitation Consult   Reason for Consult: TBI Referring Physician: Dr. Verlon Au.    HPI: Jessica Frazier is a 20 y.o. female with history of RA, Bell's palsy, hypothyroidism, abnormal LFTs felt to be due to Rinvoq who was admitted on 06/02/20 with poor po intake, weakness, muscle wasting, BLE edema and epigastric pain. She was found to have worsening of pancytopenia with black stools and while in ED, she sustained a fall with multifocal acute SAH predominantly over parietal lobes.  She was also found to have severe fatty liver with steatohepatitis,  coagulopathy with INR-2.6 and FFP ordered but d/c due to fever.  Dr. Arnoldo Morale consulted and recommended monitoring with serial CT head as patient neurologically stable. DR. Irene Limbo recommended vitamin K with platelets to keep plt>50,000. Bone marrow biopsy done for work up and path pending.   She did have progression of ICH with increase in size, edema and mass effect as well as enlargement of large left parietal scalp hematoma. She was not felt to be a surgical candidate and medical management recommended as neurologically stable. Dr. Demaris Callander consulted for input due to concerns of depression and anorexia but felt that weight loss due to diarrhea and vomiting rather than a psychiatric disorder. Dr. Benson Norway following for input and recommended AIH work up as LFTs remained persistently elevated and plans for transjugular biopsy today. She continues to have impairments due to weakness with balance deficits as well as cognitive deficits with delay in processing, memory and ability to follow simple commands. CIR recommended due to functional decline.    Review of Systems  Constitutional: Negative for chills and fever.  HENT: Negative for hearing loss.   Eyes: Positive for blurred vision.  Cardiovascular: Negative for chest pain and palpitations.  Gastrointestinal: Positive for abdominal pain.  Musculoskeletal: Positive for  myalgias.  Skin: Negative for rash.  Neurological: Positive for weakness and headaches.  Psychiatric/Behavioral: The patient is nervous/anxious.       Past Medical History:  Diagnosis Date  . Bell's palsy   . Hypothyroidism   . Rheumatoid arthritis ([place])     History reviewed. No pertinent surgical history.    Family History  Problem Relation Age of Onset  . Graves' disease Mother   . Healthy Father   . Diabetes Maternal Grandfather     Social History: Lives with parents. Independent --"stays in bed all day" or goes out with mother. Sedentary for past 5 years due to "sickness". She  reports that she has never smoked. She has never used smokeless tobacco. She reports previous alcohol use. She reports that she does not use drugs.    Allergies  Allergen Reactions  . Other Diarrhea and Other (See Comments)    Omega XL  . Rinvoq [Upadacitinib] Other (See Comments)    Enlarged the patient's liver- had to come to the ED, 2022    Medications Prior to Admission  Medication Sig Dispense Refill  . levothyroxine (SYNTHROID) 75 MCG tablet Take 1 tablet (75 mcg total) by mouth daily. 90 tablet 3  . dicyclomine (BENTYL) 20 MG tablet Take 1 tablet (20 mg total) by mouth 2 (two) times daily for 5 days. (Patient not taking: Reported on 06/02/2020) 10 tablet 0    Home: Home Living Family/patient expects to be discharged to:: Private residence Living Arrangements: Parent,Other relatives (siblings) Type of Home: House Additional Comments: little information given by patient  Functional History: Prior Function Level of Independence: Needs assistance Gait / Transfers  Assistance Needed: Patient states she gets her mom's help when she needs to get up and walk ADL's / Homemaking Assistance Needed: Needs help with bathing and dressing due to weakness Functional Status:  Mobility: Bed Mobility Overal bed mobility: Needs Assistance Bed Mobility: Supine to Sit Rolling: Total assist,+2 for  physical assistance Supine to sit: Min assist,+2 for physical assistance,+2 for safety/equipment Sit to supine: Total assist,+2 for physical assistance,+2 for safety/equipment General bed mobility comments: minA+2 for repositioning of hips with use of bed pads and trunk elevation Transfers Overall transfer level: Needs assistance Equipment used: 2 person hand held assist Transfers: Sit to/from Stand Sit to Stand: Min assist,+2 physical assistance,+2 safety/equipment General transfer comment: minA+2 for boost up to standing and steadying with HHAx2 Ambulation/Gait Ambulation/Gait assistance: Min assist,+2 physical assistance,+2 safety/equipment Gait Distance (Feet): 25 Feet Assistive device: 2 person hand held assist Gait Pattern/deviations: Step-to pattern,Decreased stride length,Ataxic,Drifts right/left,Narrow base of support,Decreased step length - right,Decreased step length - left General Gait Details: slow step to pattern with minA+2 for balance. Patient fearful with ambulation due to recent fall. Able to navigate through uncontrolled environment with obstacles Gait velocity: decreased    ADL: ADL Overall ADL's : Needs assistance/impaired Eating/Feeding: Total assistance Grooming: Set up,Sitting,Oral care Grooming Details (indicate cue type and reason): seated in recliner to brush teeth with supervision Upper Body Bathing: Total assistance,Bed level Lower Body Bathing: Total assistance,Bed level Upper Body Dressing : Moderate assistance,Sitting Lower Body Dressing: Total assistance,Bed level Toilet Transfer: Minimal assistance,+2 for physical assistance,+2 for safety/equipment,Ambulation Toilet Transfer Details (indicate cue type and reason): simulated to recliner Toileting- Clothing Manipulation and Hygiene: Total assistance Toileting - Clothing Manipulation Details (indicate cue type and reason): peeing on side of bed, aware, but did not make sure that purewick was in  place Functional mobility during ADLs: Minimal assistance,+2 for physical assistance,+2 for safety/equipment,Cueing for safety General ADL Comments: Pt severely limited by decreased strength, decreased activity tolerance, decreased ability to care for self and decreased cognition. Pt following 75% of commands with increased time; cues to open eyes and leave open upon arousal. Pt unable to grip a utensil to feed self.  Cognition: Cognition Overall Cognitive Status: Impaired/Different from baseline Orientation Level: Oriented X4,Oriented to person,Oriented to place,Oriented to time,Oriented to situation Cognition Arousal/Alertness: Awake/alert Behavior During Therapy: Flat affect Overall Cognitive Status: Impaired/Different from baseline Area of Impairment: Memory,Following commands,Awareness,Problem solving Memory: Decreased short-term memory Following Commands: Follows one step commands inconsistently,Follows one step commands with increased time Safety/Judgement: Decreased awareness of deficits Awareness: Emergent Problem Solving: Slow processing,Decreased initiation,Difficulty sequencing,Requires verbal cues,Requires tactile cues General Comments: patient with flat affect, requires increased time to process, initate, and follow commands. fearful of falling and OOB mobility.  Blood pressure 114/74, pulse (!) 108, temperature 98.3 F (36.8 C), temperature source Oral, resp. rate 17, height 5' 2"  (1.575 m), weight 53.9 kg, SpO2 97 %. Physical Exam Vitals and nursing note reviewed.  Constitutional:      Appearance: She is cachectic. She is ill-appearing.     Comments: Seen during PT session. She had difficulty maintaining balance at edge of bed and kept head flexed. She was ataxic and fearful with walking attempts. RUQ discomfort with transitional movements.   Abdominal:     Tenderness: There is abdominal tenderness in the right upper quadrant.  Neurological:     Mental Status: She is  alert and oriented to person, place, and time.     Comments: Soft voice with delayed processing. Needed cues to answer basic questions.  She thought that it was April or May and year "2021". Diffuse weakness noted in part due to poor effort.      Results for orders placed or performed during the hospital encounter of 06/02/20 (from the past 24 hour(s))  Protime-INR     Status: None   Collection Time: 06/12/20  3:48 AM  Result Value Ref Range   Prothrombin Time 13.7 11.4 - 15.2 seconds   INR 1.1 0.8 - 1.2  APTT     Status: Abnormal   Collection Time: 06/12/20  3:48 AM  Result Value Ref Range   aPTT 37 (H) 24 - 36 seconds  Comprehensive metabolic panel     Status: Abnormal   Collection Time: 06/12/20  3:48 AM  Result Value Ref Range   Sodium 137 135 - 145 mmol/L   Potassium 3.8 3.5 - 5.1 mmol/L   Chloride 103 98 - 111 mmol/L   CO2 29 22 - 32 mmol/L   Glucose, Bld 80 70 - 99 mg/dL   BUN 8 6 - 20 mg/dL   Creatinine, Ser 0.37 (L) 0.44 - 1.00 mg/dL   Calcium 8.3 (L) 8.9 - 10.3 mg/dL   Total Protein 6.7 6.5 - 8.1 g/dL   Albumin 2.0 (L) 3.5 - 5.0 g/dL   AST 167 (H) 15 - 41 U/L   ALT 117 (H) 0 - 44 U/L   Alkaline Phosphatase 175 (H) 38 - 126 U/L   Total Bilirubin 0.8 0.3 - 1.2 mg/dL   GFR, Estimated >60 >60 mL/min   Anion gap 5 5 - 15   No results found.  ***  Bary Leriche, PA-C 06/12/2020

## 2020-06-12 NOTE — Progress Notes (Signed)
Subjective: No complaints.  Objective: Vital signs in last 24 hours: Temp:  [97.7 F (36.5 C)-100.7 F (38.2 C)] 98.3 F (36.8 C) (03/31 0732) Pulse Rate:  [95-126] 108 (03/31 0732) Resp:  [16-19] 17 (03/31 0732) BP: (111-126)/(66-78) 114/74 (03/31 0732) SpO2:  [97 %-100 %] 97 % (03/31 0732) Last BM Date: 06/11/20  Intake/Output from previous day: 03/30 0701 - 03/31 0700 In: -  Out: 400 [Urine:400] Intake/Output this shift: No intake/output data recorded.  General appearance: alert and no distress GI: RUQ tenderness with palpation.  Lab Results: Recent Labs    06/10/20 0311 06/11/20 0512  WBC 3.8* 4.6  HGB 9.0* 9.9*  HCT 28.6* 30.0*  PLT 104* 149*   BMET Recent Labs    06/10/20 0311 06/11/20 0512 06/12/20 0348  NA 137 137 137  K 3.5 4.0 3.8  CL 105 103 103  CO2 27 27 29   GLUCOSE 83 85 80  BUN <5* 5* 8  CREATININE 0.34* 0.46 0.37*  CALCIUM 7.9* 8.2* 8.3*   LFT Recent Labs    06/12/20 0348  PROT 6.7  ALBUMIN 2.0*  AST 167*  ALT 117*  ALKPHOS 175*  BILITOT 0.8   PT/INR Recent Labs    06/11/20 0512 06/12/20 0348  LABPROT 14.5 13.7  INR 1.2 1.1   Hepatitis Panel No results for input(s): HEPBSAG, HCVAB, HEPAIGM, HEPBIGM in the last 72 hours. C-Diff No results for input(s): CDIFFTOX in the last 72 hours. Fecal Lactopherrin No results for input(s): FECLLACTOFRN in the last 72 hours.  Studies/Results: No results found.  Medications:  Scheduled: . B-complex with vitamin C  1 tablet Oral Daily  . Chlorhexidine Gluconate Cloth  6 each Topical Daily  . cholecalciferol  1,000 Units Oral Daily  . feeding supplement  237 mL Oral TID BM  . hydroxypropyl methylcellulose / hypromellose  1 drop Both Eyes TID  . levothyroxine  75 mcg Oral QAC breakfast  . mirtazapine  30 mg Oral QHS  . multivitamin with minerals  1 tablet Oral Daily  . pantoprazole  40 mg Oral QHS  . phytonadione  5 mg Oral Daily  . predniSONE  40 mg Oral Q breakfast  . thiamine   100 mg Oral Daily   Continuous: . 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 2) RA. 3) SAH.   The patient is stable, but her liver enzymes continue to fluctuate.  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  The F-actin is minimally elevated.  She is on prednisone, but there is no discernable and persistent improvement in her liver enzymes.  Plan: 1) Transjugular liver biopsy as she has RUQ pain with palpation.    LOS: 9 days   Bracen Schum D 06/12/2020, 8:00 AM

## 2020-06-12 NOTE — Progress Notes (Signed)
PROGRESS NOTE   Jessica Frazier  FTD:[id number] DOB: Jan 09, 2001 DOA: 06/02/2020 PCP: Pcp, No  Brief Narrative:   20 year old female Hypothyroidism followed by Dr. Amedeo Kinsman Bell's palsy 09/02/2018 Rheumatoid arthritis follows previously on Rinvoq with transaminitis secondary to this 04/09/2020 Readmitted 06/02/2020 platelets 61 hemoglobin 9.6 and black-colored stool CTA chest CT abdomen pelvis showed severe fatty liver possible steatohepatitis  In the emergency room mechanical fall with multifocal acute subarachnoid blood and large parietal hematoma INR was 2.6 patient given FFP-FFP stopped Repeat CT head = multifocal new intracranial hemorrhage Neurosurgery consulted Hematology consulted, GI consulted, psychiatry consulted  Hospital-Problem based course  Traumatic subarachnoid subdural hemorrhages in setting of coagulopathy thrombocytopenia Neurosurgery signing off-patient neuro status stable 3/31 minimize CNS meds Coagulopathy uncertain cause Pancytopenia now resolving?  Autoimmune disease-BM biopsy no blasts on final smear  Cytogenetics -normal blood karyotyping Vitamin K has been recommended by hematology as well as steroids ending 06/16/2020 or taper appreciate Dr. Irene Limbo OP f/u if PRN Fatty liver ?  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 of 115 IgG 10 Outpatient further rheumatological follow-up D/w Marella Chimes, PA at [facility] rheumatology =Pednisone 40 daily till OP f/u 06/16/20@ 1300 with Ms Pearline Cables Acute kidney injury Monitor trends Depression Continue Prozac started  this admission Sick euthyroid Continue Synthroid   DVT prophylaxis: SCD Code Status: Full Family Communication:  Disposition:  Status is: Inpatient  Remains inpatient appropriate because:Hemodynamically unstable, Persistent severe electrolyte disturbances and Unsafe d/c plan   Dispo: The patient is from: Home              Anticipated d/c is to: Unclear at this time              Patient currently is not medically stable to d/c.   Difficult to place patient No       Consultants:   Multiple as above  Procedures:   Significant Events: 06/03/2000 admitted to the ICU for subarachnoid, subdural hemorrhage.     Significant studies: 06/03/2020 CT scan of the abdomen and pelvis showed fatty liver with hepatic steatosis small ascites. 05/14/2020 CT of the head new multifocal intracranial hemorrhage with expanding left scalp hematoma and new left-sided subdural hematoma, mild subarachnoid hemorrhage, with small midline shift. 06/04/2020 bone marrow biopsy 06/12/20 random liver biopsy performed  Antimicrobials: None currently   Subjective:  BF bedside She feels good Biopsy liver was done--she is hungry-asking for diet as was npo  Objective: Vitals:   06/12/20 1332 06/12/20 1347 06/12/20 1353 06/12/20 1400  BP: 109/71 (!) 108/54 104/63 (!) 107/55  Pulse: 87 81 81 83  Resp: (!) _0 Temp:      TempSrc:      SpO2: 99% 100% 100% 99%  Weight:      Height:        Intake/Output Summary (Last 24 hours) at 06/12/2020 1519 Last data filed at 06/12/2020 1430 Gross per 24 hour  Intake 0 ml  Output 675 ml  Net -675 ml   Filed Weights   06/08/20 0534 06/09/20 0349 06/11/20 0335  Weight: 50.7 kg 50.7 kg 53.9 kg  Examination:  eomi ncat no focal deficit, anicteric In nad S1-S2 no murmur no rub no gallop CTA B Quite asthenic No HSM, cannot palpate organs, scaphoid abd No lower extremity edema   Data Reviewed: personally reviewed   CBC    Component Value  Date/Time   WBC 4.6 06/11/2020 0512   RBC 3.19 (L) 06/11/2020 0512   HGB 9.9 (L) 06/11/2020 0512   HCT 30.0 (L) 06/11/2020 0512   PLT 149 (L) 06/11/2020 0512   MCV 94.0 06/11/2020 0512   MCH 31.0 06/11/2020 0512   MCHC 33.0 06/11/2020 0512   RDW 18.6 (H) 06/11/2020 0512   LYMPHSABS 1.5 06/04/2020 2142   MONOABS 0.4 06/04/2020 2142   EOSABS 0.0 06/04/2020 2142   BASOSABS 0.0 06/04/2020 2142   CMP Latest Ref Rng & Units 06/12/2020 06/11/2020 06/10/2020  Glucose 70 - 99 mg/dL 80 85 83  BUN 6 - 20 mg/dL 8 5(L) <5(L)  Creatinine 0.44 - 1.00 mg/dL 0.37(L) 0.46 0.34(L)  Sodium 135 - 145 mmol/L 137 137 137  Potassium 3.5 - 5.1 mmol/L 3.8 4.0 3.5  Chloride 98 - 111 mmol/L 103 103 105  CO2 22 - 32 mmol/L _0 Calcium 8.9 - 10.3 mg/dL 8.3(L) 8.2(L) 7.9(L)  Total Protein 6.5 - 8.1 g/dL 6.7 6.6 6.2(L)  Total Bilirubin 0.3 - 1.2 mg/dL 0.8 0.9 0.6  Alkaline Phos 38 - 126 U/L 175(H) 161(H) 153(H)  AST 15 - 41 U/L 167(H) 147(H) 155(H)  ALT 0 - 44 U/L 117(H) 97(H) 94(H)     Radiology Studies: US BIOPSY (LIVER)  Result Date: 06/12/2020 INDICATION: 20 year old female with a history of fatty liver, referred medical liver biopsy EXAM: ULTRASOUND-GUIDED MEDICAL LIVER BIOPSY MEDICATIONS: None. ANESTHESIA/SEDATION: Moderate (conscious) sedation was employed during this procedure. A total of Versed 1 point mg and Fentanyl 50 mcg was administered intravenously. Moderate Sedation Time: 10 minutes. The patient's level of consciousness and vital signs were monitored continuously by radiology nursing throughout the procedure under my direct supervision. FLUOROSCOPY TIME:  None COMPLICATIONS: None PROCEDURE: Informed written consent was obtained from the patient after a thorough discussion of the procedural risks, benefits and alternatives. All questions were addressed. Maximal Sterile Barrier Technique was utilized including caps, mask, sterile gowns, sterile gloves, sterile drape, hand hygiene and skin  antiseptic. A timeout was performed prior to the initiation of the procedure. Ultrasound survey of the right liver lobe performed with images stored and sent to PACs. The right lower thorax/right upper abdomen was prepped with chlorhexidine in a sterile fashion, and a sterile drape was applied covering the operative field. A sterile gown and sterile gloves were used for the procedure. Local anesthesia was provided with 1% Lidocaine. The patient was prepped and draped sterilely and the skin and subcutaneous tissues were generously infiltrated with 1% lidocaine. A 17 gauge introducer needle was then advanced under ultrasound guidance in an intercostal location into the right liver lobe. The stylet was removed, and multiple separate 18 gauge core biopsy were retrieved. Samples were placed into formalin for transportation to the lab. Gel-Foam pledgets were then infused with a small amount of saline for assistance with hemostasis. The needle was removed, and a final ultrasound image was performed. The patient tolerated the procedure well and remained hemodynamically stable throughout. No complications were encountered and no significant blood loss was encounter. IMPRESSION: Status post ultrasound-guided medical liver biopsy. Signed, Dulcy Fanny. Dellia Nims, [facility]iology Electronically Signed   By: York Cerise  Earleen Newport D.O.   On: 06/12/2020 14:14     Scheduled Meds: . B-complex with vitamin C  1 tablet Oral Daily  . Chlorhexidine Gluconate Cloth  6 each Topical Daily  . cholecalciferol  1,000 Units Oral Daily  . feeding supplement  237 mL Oral TID BM  . fentaNYL      . gelatin adsorbable      . hydroxypropyl methylcellulose / hypromellose  1 drop Both Eyes TID  . levothyroxine  75 mcg Oral QAC breakfast  . lidocaine (PF)      . midazolam      . mirtazapine  30 mg Oral QHS  . multivitamin with minerals  1 tablet Oral Daily  . pantoprazole  40 mg Oral QHS   . phytonadione  5 mg Oral Daily  . predniSONE  40 mg Oral Q breakfast  . thiamine  100 mg Oral Daily   Continuous Infusions: . sodium chloride 10 mL/hr at 06/06/20 1600     LOS: 9 days   Time spent: 35  Nita Sells, MD [facility] To contact the attending provider between 7A-7P or the covering provider during after hours 7P-7A, please log into the web site [email or web address] and access using universal [place] password for that web site. If you do not have the password, please call the hospital operator.  06/12/2020, 3:19 PM

## 2020-06-12 NOTE — Consult Note (Signed)
Chief Complaint: Patient was seen in consultation today for image guided random core liver biopsy Chief Complaint  Patient presents with  . Abdominal Pain    Tachy     Referring Physician(s): Hung,P  Supervising Physician: Daryll Brod  Patient Status: [facility]  History of Present Illness: Jessica Frazier is a 20 y.o. female past medical history of rheumatoid arthritis, hypothyroidism, Bell's palsy, and abnormal LFTs felt to be due to Rinvoq who was admitted to [facility] on 06/02/2020 with poor oral intake, weakness, muscle wasting, bilateral lower extremity edema and epigastric pain.  He was noted to have worsening of her pancytopenia with black stools and while in ED she sustained a fall with multifocal acute subarachnoid hemorrhage predominantly over the parietal lobes.  He was also found to have severe fatty liver with steatohepatitis, coagulopathy with INR of 2.6 and FFP ordered but discontinued due to fever.  Both neurosurgery and oncology were consulted.  Patient is to be monitored with serial CT heads as she is neurologically stable.  Vitamin K ordered to keep platelets greater than 50 K.  Bone marrow biopsy on 3/23 revealed normocellular bone marrow with trilineage lineage hematopoiesis including increased megakaryocytes and pancytopenia noted on peripheral blood.  Blood cultures pending.  COVID-19 negative.  Due to persistently elevated LFTs request now received from GI service for random core liver biopsy, ? autoimmune hepatitis.  Current labs include WBC 4.6, hemoglobin 9.9, platelets 149k, creatinine 0.37 ,total bilirubin 0.8, AST 167, ALT 117, alk phos 175 PT/INR normal.  Latest temp 99.7, BP 125/81, slightly tachycardic at 107.  Past Medical History:  Diagnosis Date  . Bell's palsy   . Hypothyroidism   . Rheumatoid arthritis ([place])     History reviewed. No pertinent surgical history.  Allergies: Other and Rinvoq  [upadacitinib]  Medications: Prior to Admission medications   Medication Sig Start Date End Date Taking? Authorizing Provider  levothyroxine (SYNTHROID) 75 MCG tablet Take 1 tablet (75 mcg total) by mouth daily. 08/08/19  Yes Shamleffer, Melanie Crazier, MD  dicyclomine (BENTYL) 20 MG tablet Take 1 tablet (20 mg total) by mouth 2 (two) times daily for 5 days. Patient not taking: Reported on 06/02/2020 05/05/20 05/10/20  Volanda Napoleon, PA-C     Family History  Problem Relation Age of Onset  . Graves' disease Mother   . Healthy Father   . Diabetes Maternal Grandfather     Social History   Socioeconomic History  . Marital status: Single    Spouse name: Not on file  . Number of children: Not on file  . Years of education: Not on file  . Highest education level: Not on file  Occupational History  . Not on file  Tobacco Use  . Smoking status: Never Smoker  . Smokeless tobacco: Never Used  Substance and Sexual Activity  . Alcohol use: Not Currently  . Drug use: Never  . Sexual activity: Not on file  Other Topics Concern  . Not on file  Social History Narrative  . Not on file   Social Determinants of Health   Financial Resource Strain: Not on file  Food Insecurity: Not on file  Transportation Needs: Not on file  Physical Activity: Not on file  Stress: Not on file  Social Connections: Not on file      Review of Systems currently denies high fever-pain, dyspnea, cough, back pain, nausea, vomiting or bleeding.  She does have occasional headaches and primarily right upper quadrant abdominal  pain  Vital Signs: BP 114/74 (BP Location: Left Arm)   Pulse (!) 108   Temp 98.3 F (36.8 C) (Oral)   Resp 17   Ht _0  (1.575 m)   Wt 118 lb 13.3 oz (53.9 kg)   SpO2 97%   BMI 21.73 kg/m   Physical Exam awake, answers simple questions okay.  Not sure of day of week.  Chest with slight diminished breath sounds right base, left clear.  Heart with tachycardic but regular rhythm.   Abdomen soft, few bowel sounds, mild mildly tender right upper quadrant to palpation.  No lower extremity edema.  Imaging: CT HEAD WO CONTRAST  Result Date: 06/05/2020 CLINICAL DATA:  Follow-up examination for stroke. EXAM: CT HEAD WITHOUT CONTRAST TECHNIQUE: Contiguous axial images were obtained from the base of the skull through the vertex without intravenous contrast. COMPARISON:  Prior CT from 08/04/2020. FINDINGS: Brain: Multiple intracranial hemorrhages again seen. Large hematoma at the right temporal lobe is not significantly changed measuring 2.9 x 2.6 x 6.5 cm, similar to previous when measured in a similar fashion. Localized mass effect and edema about this hemorrhage has mildly increased. Additional hemorrhage positioned at the parasagittal right frontal lobe also little interval changed in size measuring 2.7 x 2.5 cm slightly increased edema about this hemorrhage with localized right-to-left shift. Additional right frontal hemorrhage slightly inferiorly is stable measuring 1.7 cm. Intraparenchymal hemorrhage at the parasagittal left frontal region is little interval changed in size measuring 3.7 x 2.6 cm. Localized edema has mildly increased. Additional scattered small volume subarachnoid hemorrhage little interval changed. Mild right-to-left midline shift is relatively stable. Stable ventricular size and morphology without hydrocephalus. No other new hemorrhage. No acute large vessel territory infarct. Vascular: No hyperdense vessel. Skull: Large evolving left posterior scalp contusion. Calvarium unchanged. Sinuses/Orbits: Globes orbital soft tissues demonstrate no new abnormality. Visualized paranasal sinuses and mastoid air cells remain largely clear. Other: None. IMPRESSION: 1. No significant interval change in size of multiple intracranial hemorrhages as above, although localized mass effect and edema about these hemorrhages has mildly increased. Mild right-to-left midline shift is relatively  stable. No hydrocephalus or trapping. 2. No significant interval change in scattered small volume subarachnoid hemorrhage. 3. No other new acute intracranial abnormality. 4. Large evolving left posterior scalp contusion. Electronically Signed   By: Jeannine Boga M.D.   On: 06/05/2020 04:43   CT HEAD WO CONTRAST  Addendum Date: 06/04/2020   ADDENDUM REPORT: 06/04/2020 15:11 ADDENDUM: These results were called by telephone at the time of interpretation on 06/04/2020 at 3:11 pm to provider [facility] , who verbally acknowledged these results. Electronically Signed   By: Franchot Gallo M.D.   On: 06/04/2020 15:11   Result Date: 06/04/2020 CLINICAL DATA:  Head trauma.  Coagulopathy EXAM: CT HEAD WITHOUT CONTRAST TECHNIQUE: Contiguous axial images were obtained from the base of the skull through the vertex without intravenous contrast. COMPARISON:  CT head 06/03/2020 FINDINGS: Brain: Multiple foci of cerebral hemorrhage have progressed since yesterday. Large hematoma in the right temporal lobe measuring approximately 6.0 X 2.2 cm. This has a mixture of high density and low-density blood. There is local mass-effect and edema. 17 mm adjacent right frontal hematoma also with progression. Right medial frontal high-density hematoma has enlarged now measuring 25 x 23 mm. Local mass-effect on the frontal horn. Left frontal parietal periventricular hematoma now measures 38 x 25 mm with significant interval enlargement. There is adjacent edema Mild subarachnoid hemorrhage again noted without progression. Ventricle size normal.  Mild midline shift to the left. Vascular: Negative for hyperdense vessel Skull: Negative for skull fracture. Sinuses/Orbits: Prior sinus surgery. Mild mucosal edema left maxillary sinus. Negative orbit. Other: Large left parietal scalp hematoma has progressed with further hemorrhage in the scalp. IMPRESSION: 1. Multiple foci of intracranial hemorrhage have progressed since yesterday. Multiple  large areas of hemorrhage in the right temporal lobe, right frontal lobe, and left frontal parietal lobe have all increased in size with surrounding edema and local mass-effect. Mild subarachnoid hemorrhage unchanged 2. Large left parietal scalp hematoma has enlarged since yesterday compatible with coagulopathy. Electronically Signed: By: Franchot Gallo M.D. On: 06/04/2020 14:57   CT HEAD WO CONTRAST  Addendum Date: 06/03/2020   ADDENDUM REPORT: 06/03/2020 09:16 ADDENDUM: Critical Value/emergent results were called by telephone at the time of interpretation on 06/03/2020 at 0904 hours to ED provider Linus Galas PA who verbally acknowledged these results. And she advises the patient is coagulopathic with an INR of 2.6. Electronically Signed   By: Genevie Ann M.D.   On: 06/03/2020 09:16   Result Date: 06/03/2020 CLINICAL DATA:  20 year old female status post head trauma from fall with superior convexity subarachnoid hemorrhage. EXAM: CT HEAD WITHOUT CONTRAST TECHNIQUE: Contiguous axial images were obtained from the base of the skull through the vertex without intravenous contrast. COMPARISON:  Head CT 0115 hours today. FINDINGS: Brain: New small low-density left side subdural hematoma measures 3-4 mm in thickness (series 3, image 16). There is trace rightward midline shift now. Small volume subarachnoid hemorrhage mostly over the left superior convexity and along the interhemispheric fissure has mildly increased. Superimposed focal hemorrhagic contusions of the right cingulate gyrus (coronal image 21) and left superior frontal gyrus up to 3 cm (coronal image 36) are now apparent with mild regional edema. There is also a more subtle hemorrhagic contusion of the right anterior temporal tip suspected (series 3, image 7 and coronal image 24) with mild edema. Basilar cisterns remain normal. No intraventricular blood or ventriculomegaly. No superimposed acute cortically based infarct. Vascular: No suspicious  intracranial vascular hyperdensity. Skull: No skull fracture identified. Sinuses/Orbits: Visualized paranasal sinuses and mastoids are stable and well pneumatized. Other: Expanded and now very large scalp hematoma tracking over most of the superior and left lateral convexity. Areas of hypodense likely hyperacute scalp hemorrhage on series 3, image 18 are new from earlier today. Hematoma thickness up to 16 mm. Visualized orbit soft tissues are within normal limits. IMPRESSION: 1. Multifocal new intracranial hemorrhage, in conjunction with large and expanding left scalp hematoma, raises the possibility of underlying Coagulopathy. 2. New left side subdural hematoma since 0115 hours, 3-4 mm in thickness. 3. Three cerebral hemorrhagic contusions now identified, the largest nearly 3 cm in the left superior frontal gyrus (right cingulate and right anterior temporal tip also). 4. Mildly increased small volume of posttraumatic appearing subarachnoid hemorrhage. 5. Trace rightward midline shift now. Basilar cisterns remain normal. No IVH or ventriculomegaly. 6. No skull fracture identified. Electronically Signed: By: Genevie Ann M.D. On: 06/03/2020 08:47   CT Head Wo Contrast  Result Date: 06/03/2020 CLINICAL DATA:  Head trauma EXAM: CT HEAD WITHOUT CONTRAST TECHNIQUE: Contiguous axial images were obtained from the base of the skull through the vertex without intravenous contrast. COMPARISON:  None. FINDINGS: Brain: Multifocal acute subarachnoid blood, greatest over the superior parietal lobes. No midline shift or other mass effect. Vascular: No abnormal hyperdensity of the major intracranial arteries or dural venous sinuses. No intracranial atherosclerosis. Skull: Large left parietal scalp hematoma  without skull fracture. Sinuses/Orbits: No fluid levels or advanced mucosal thickening of the visualized paranasal sinuses. No mastoid or middle ear effusion. The orbits are normal. IMPRESSION: 1. Multifocal acute subarachnoid  blood, greatest over the superior parietal lobes. 2. Large left parietal scalp hematoma without skull fracture. Critical Value/emergent results were called by telephone at the time of interpretation on 06/03/2020 at 1:41 am to provider [facility] , who verbally acknowledged these results. Electronically Signed   By: Ulyses Jarred M.D.   On: 06/03/2020 01:45   CT Angio Chest PE W/Cm &/Or Wo Cm  Result Date: 06/02/2020 CLINICAL DATA:  20 year old female with abdominal pain and concern for pulmonary embolism. EXAM: CT ANGIOGRAPHY CHEST CT ABDOMEN AND PELVIS WITH CONTRAST TECHNIQUE: Multidetector CT imaging of the chest was performed using the standard protocol during bolus administration of intravenous contrast. Multiplanar CT image reconstructions and MIPs were obtained to evaluate the vascular anatomy. Multidetector CT imaging of the abdomen and pelvis was performed using the standard protocol during bolus administration of intravenous contrast. CONTRAST:  178m OMNIPAQUE IOHEXOL 350 MG/ML SOLN COMPARISON:  Abdominal ultrasound dated 05/05/2020. FINDINGS: CTA CHEST FINDINGS Cardiovascular: There is no cardiomegaly or pericardial effusion. The thoracic aorta is unremarkable. The origins of the great vessels of the aortic arch appear patent. Faint linear hyperdensity along the left lateral wall of the pulmonary trunk (56/6) likely artifactual. No pulmonary artery embolus identified. Mediastinum/Nodes: There is no hilar or mediastinal adenopathy. The esophagus is grossly unremarkable. No mediastinal fluid collection. Lungs/Pleura: Bibasilar linear atelectasis/scarring. No focal consolidation, pleural effusion, or pneumothorax. The central airways are patent. Musculoskeletal: No chest wall abnormality. No acute or significant osseous findings. Review of the MIP images confirms the above findings. CT ABDOMEN and PELVIS FINDINGS No intra-abdominal free air.  Small ascites. Hepatobiliary: Severe fatty liver. The liver  is enlarged measuring 19 cm in midclavicular length. Correlation with clinical exam and LFTs recommended to evaluate for steatohepatitis. No intrahepatic biliary ductal dilatation. The gallbladder is predominantly contracted. No calcified gallstone. Pancreas: Unremarkable. No pancreatic ductal dilatation or surrounding inflammatory changes. Spleen: Normal in size without focal abnormality. Adrenals/Urinary Tract: The adrenal glands are unremarkable. There is no hydronephrosis on either side. The visualized ureters appear unremarkable. The urinary bladder is minimally distended and grossly unremarkable. Stomach/Bowel: There is no bowel obstruction or active inflammation. The appendix is normal. Vascular/Lymphatic: The abdominal aorta and IVC are unremarkable. No portal venous gas. There is no adenopathy. Reproductive: The uterus is anteverted and grossly unremarkable. No adnexal masses. Other: Mild diffuse subcutaneous edema. Musculoskeletal: No acute or significant osseous findings. Review of the MIP images confirms the above findings. IMPRESSION: 1. No acute intrathoracic pathology. No CT evidence of pulmonary embolism. 2. Severe fatty liver with findings of possible steatohepatitis. Clinical correlation is recommended. 3. Small ascites. 4. No bowel obstruction. Normal appendix. Electronically Signed   By: AAnner CreteM.D.   On: 06/02/2020 21:26   CT ABDOMEN PELVIS W CONTRAST  Result Date: 06/02/2020 CLINICAL DATA:  20year old female with abdominal pain and concern for pulmonary embolism. EXAM: CT ANGIOGRAPHY CHEST CT ABDOMEN AND PELVIS WITH CONTRAST TECHNIQUE: Multidetector CT imaging of the chest was performed using the standard protocol during bolus administration of intravenous contrast. Multiplanar CT image reconstructions and MIPs were obtained to evaluate the vascular anatomy. Multidetector CT imaging of the abdomen and pelvis was performed using the standard protocol during bolus administration of  intravenous contrast. CONTRAST:  1038mOMNIPAQUE IOHEXOL 350 MG/ML SOLN COMPARISON:  Abdominal ultrasound dated 05/05/2020.  FINDINGS: CTA CHEST FINDINGS Cardiovascular: There is no cardiomegaly or pericardial effusion. The thoracic aorta is unremarkable. The origins of the great vessels of the aortic arch appear patent. Faint linear hyperdensity along the left lateral wall of the pulmonary trunk (56/6) likely artifactual. No pulmonary artery embolus identified. Mediastinum/Nodes: There is no hilar or mediastinal adenopathy. The esophagus is grossly unremarkable. No mediastinal fluid collection. Lungs/Pleura: Bibasilar linear atelectasis/scarring. No focal consolidation, pleural effusion, or pneumothorax. The central airways are patent. Musculoskeletal: No chest wall abnormality. No acute or significant osseous findings. Review of the MIP images confirms the above findings. CT ABDOMEN and PELVIS FINDINGS No intra-abdominal free air.  Small ascites. Hepatobiliary: Severe fatty liver. The liver is enlarged measuring 19 cm in midclavicular length. Correlation with clinical exam and LFTs recommended to evaluate for steatohepatitis. No intrahepatic biliary ductal dilatation. The gallbladder is predominantly contracted. No calcified gallstone. Pancreas: Unremarkable. No pancreatic ductal dilatation or surrounding inflammatory changes. Spleen: Normal in size without focal abnormality. Adrenals/Urinary Tract: The adrenal glands are unremarkable. There is no hydronephrosis on either side. The visualized ureters appear unremarkable. The urinary bladder is minimally distended and grossly unremarkable. Stomach/Bowel: There is no bowel obstruction or active inflammation. The appendix is normal. Vascular/Lymphatic: The abdominal aorta and IVC are unremarkable. No portal venous gas. There is no adenopathy. Reproductive: The uterus is anteverted and grossly unremarkable. No adnexal masses. Other: Mild diffuse subcutaneous edema.  Musculoskeletal: No acute or significant osseous findings. Review of the MIP images confirms the above findings. IMPRESSION: 1. No acute intrathoracic pathology. No CT evidence of pulmonary embolism. 2. Severe fatty liver with findings of possible steatohepatitis. Clinical correlation is recommended. 3. Small ascites. 4. No bowel obstruction. Normal appendix. Electronically Signed   By: Anner Crete M.D.   On: 06/02/2020 21:26   DG Abd Portable 1V  Result Date: 06/08/2020 CLINICAL DATA:  Feeding tube placement. EXAM: PORTABLE ABDOMEN - 1 VIEW COMPARISON:  None. FINDINGS: A small bore feeding tube is noted with tip in the peripyloric region. Bowel gas pattern is unremarkable. IMPRESSION: Small bore feeding tube with tip in the peripyloric region. Electronically Signed   By: Margarette Canada M.D.   On: 06/08/2020 11:39   EEG adult  Result Date: 06/04/2020 Raenette Rover, MD     06/04/2020  6:20 PM EEG Report Indication: possible seizure activity in setting of SAH This study was recorded in the comatose/not medically induced state.  The duration of the study was 27 minutes.  Electrodes were placed according to the International 10/20 system.  Video was reviewed available for clinical correlation as needed. There was no evidence of organization at any point, with no discernible anterior - posterior voltage or frequency gradient, posterior dominant rhythm or sleep architecture noted at any point. The background instead is comprised of almost exclusively low - moderate amplitude Delta range slowing in the 1 - 2 Hz range.  There was no clear reactivity/state change and minimal variability. There may have been somewhat lower amplitudes in the left hemisphere as compared to the right, although this was subtle. Hyperventilation: deferred Photic stimulation: deferred There are no clear paroxysmal or epileptiform abnormalities  or interhemispheric asymmetries. Impression: This is an abnormal study due to diffuse, low  - moderate attitude Delta slowing with no organization and minimal evidence of reactivity. The background is continuous however. 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 There were slightly lower amplitudes in the left hemisphere  as compared to the right, particularly the  posterior region, possibly suggestive of focal neuronal dysfunction, although this was subtle.  There are no clear epileptiform abnormalities.   VAS [place] LOWER EXTREMITY VENOUS (DVT)  Result Date: 06/04/2020  Lower Venous DVT Study Indications: Edema.  Comparison Study: no prior Performing Technologist: Abram Sander RVS  Examination Guidelines: A complete evaluation includes B-mode imaging, spectral Doppler, color Doppler, and power Doppler as needed of all accessible portions of each vessel. Bilateral testing is considered an integral part of a complete examination. Limited examinations for reoccurring indications may be performed as noted. The reflux portion of the exam is performed with the patient in reverse Trendelenburg.  +---------+---------------+---------+-----------+----------+--------------+ RIGHT    CompressibilityPhasicitySpontaneityPropertiesThrombus Aging +---------+---------------+---------+-----------+----------+--------------+ CFV      Full           Yes      Yes                                 +---------+---------------+---------+-----------+----------+--------------+ SFJ      Full                                                        +---------+---------------+---------+-----------+----------+--------------+ FV Prox  Full                                                        +---------+---------------+---------+-----------+----------+--------------+ FV Mid   Full                                                        +---------+---------------+---------+-----------+----------+--------------+ FV DistalFull                                                         +---------+---------------+---------+-----------+----------+--------------+ PFV      Full                                                        +---------+---------------+---------+-----------+----------+--------------+ POP      Full           Yes      Yes                                 +---------+---------------+---------+-----------+----------+--------------+ PTV      Full                                                        +---------+---------------+---------+-----------+----------+--------------+  PERO     Full                                                        +---------+---------------+---------+-----------+----------+--------------+   +---------+---------------+---------+-----------+----------+--------------+ LEFT     CompressibilityPhasicitySpontaneityPropertiesThrombus Aging +---------+---------------+---------+-----------+----------+--------------+ CFV      Full           Yes      Yes                                 +---------+---------------+---------+-----------+----------+--------------+ SFJ      Full                                                        +---------+---------------+---------+-----------+----------+--------------+ FV Prox  Full                                                        +---------+---------------+---------+-----------+----------+--------------+ FV Mid   Full                                                        +---------+---------------+---------+-----------+----------+--------------+ FV DistalFull                                                        +---------+---------------+---------+-----------+----------+--------------+ PFV      Full                                                        +---------+---------------+---------+-----------+----------+--------------+ POP      Full           Yes      Yes                                  +---------+---------------+---------+-----------+----------+--------------+ PTV      Full                                                        +---------+---------------+---------+-----------+----------+--------------+ PERO     Full                                                        +---------+---------------+---------+-----------+----------+--------------+  Summary: BILATERAL: - No evidence of deep vein thrombosis seen in the lower extremities, bilaterally. - No evidence of superficial venous thrombosis in the lower extremities, bilaterally. -No evidence of popliteal cyst, bilaterally.   *See table(s) above for measurements and observations. Electronically signed by Deitra Mayo MD on 06/04/2020 at 5:15:45 AM.    Final    US Abdomen Limited RUQ (LIVER/GB)  Result Date: 06/03/2020 CLINICAL DATA:  Ischemic hepatitis. EXAM: ULTRASOUND ABDOMEN LIMITED RIGHT UPPER QUADRANT COMPARISON:  CT abdomen pelvis 05/05/2020 FINDINGS: Gallbladder: The gallbladder appears to be contracted. No gallstones or wall thickening visualized. No sonographic Murphy sign noted by sonographer. Common bile duct: Diameter: 87m Liver: Limited evaluation due to positioning. No focal lesion identified. Increased parenchymal echogenicity. Portal vein is patent on color Doppler imaging with normal direction of blood flow towards the liver. Other: None. IMPRESSION: Hepatic steatosis. Please note limited evaluation for focal hepatic masses in a patient with hepatic steatosis due to decreased penetration of the acoustic ultrasound waves. Also limited evaluation due to positioning. Recommend MRI liver protocol. Electronically Signed   By: MIven FinnM.D.   On: 06/03/2020 22:51    Labs:  CBC: Recent Labs    06/08/20 0146 06/09/20 0456 06/10/20 0311 06/11/20 0512  WBC 5.6 5.1 3.8* 4.6  HGB 9.3* 9.2* 9.0* 9.9*  HCT 28.6* 27.9* 28.6* 30.0*  PLT 130* 92* 104* 149*    COAGS: Recent Labs     06/09/20 0456 06/10/20 0311 06/11/20 0512 06/12/20 0348  INR 1.2 1.1 1.2 1.1  APTT 41* 38* 39* 37*    BMP: Recent Labs    06/09/20 0456 06/10/20 0311 06/11/20 0512 06/12/20 0348  NA 133* 137 137 137  K 3.6 3.5 4.0 3.8  CL 104 105 103 103  CO2 _0 GLUCOSE 89 83 85 80  BUN <5* <5* 5* 8  CALCIUM 7.7* 7.9* 8.2* 8.3*  CREATININE 0.38* 0.34* 0.46 0.37*  GFRNONAA >60 >60 >60 >60    LIVER FUNCTION TESTS: Recent Labs    06/09/20 0456 06/10/20 0311 06/11/20 0512 06/12/20 0348  BILITOT QUANTITY NOT SUFFICIENT, UNABLE TO PERFORM TEST 0.6 0.9 0.8  AST 167* 155* 147* 167*  ALT 93* 94* 97* 117*  ALKPHOS 142* 153* 161* 175*  PROT 6.2* 6.2* 6.6 6.7  ALBUMIN 1.7* 1.7* 2.0* 2.0*    TUMOR MARKERS: No results for input(s): AFPTM, CEA, CA199, CHROMGRNA in the last 8760 hours.  Assessment and Plan: 20y.o. female past medical history of rheumatoid arthritis, hypothyroidism, Bell's palsy, and abnormal LFTs felt to be due to Rinvoq [facility] poor oral intake, weakness, muscle wasting, bilateral lower extremity edema and epigastric pain.  He was noted to have worsening of her pancytopenia with black stools and while in ED she sustained a fall with multifocal acute subarachnoid hemorrhage predominantly over the parietal lobes.  He was also found to have severe fatty liver with steatohepatitis, coagulopathy with INR of 2.6 and FFP ordered but discontinued due to fever.  Both neurosurgery and oncology were consulted.  Patient is to be monitored with serial CT heads as she is neurologically stable.  Vitamin K ordered to keep platelets greater than 50 K.  Bone marrow biopsy on 3/23 revealed normocellular bone marrow with trilineage lineage hematopoiesis including increased megakaryocytes and pancytopenia noted on peripheral blood.  Blood cultures pending.  COVID-19 negative.  Due to persistently elevated LFTs request now received from GI service  for random  core liver biopsy, ? autoimmune hepatitis.  Current labs include WBC 4.6, hemoglobin 9.9, platelets 149k, creatinine 0.37 ,total bilirubin 0.8, AST 167, ALT 117, alk phos 175 PT/INR normal.  Latest temp 99.7, BP 125/81, slightly tachycardic at 107.  Case has been reviewed by Dr. Annamaria Boots. Risks and benefits of procedure was discussed with the patient and/or patient's family including, but not limited to bleeding, infection, damage to adjacent structures or low yield requiring additional tests.  All of the questions were answered and there is agreement to proceed.  Consent signed and in chart.  Procedure tentatively scheduled for this afternoon.   Thank you for this interesting consult.  I greatly enjoyed meeting Dawna Cisneros Prieto and look forward to participating in their care.  A copy of this report was sent to the requesting provider on this date.  Electronically Signed: D. Rowe Robert, PA-C 06/12/2020, 11:40 AM   I spent a total of 25 minutes in face to face in clinical consultation, greater than 50% of which was counseling/coordinating care for image guided random core liver biopsy

## 2020-06-12 NOTE — Procedures (Signed)
Interventional Radiology Procedure Note  Procedure: US guided biopsy of liver biopsy, medical liver Complications: None EBL: None Recommendations: - Bedrest at least 2 hours.   - Routine wound care - Follow up pathology - Advance diet per primary order  Signed,  Corrie Mckusick, DO

## 2020-06-13 DIAGNOSIS — I609 Nontraumatic subarachnoid hemorrhage, unspecified: Secondary | ICD-10-CM | POA: Diagnosis not present

## 2020-06-13 LAB — CULTURE, BLOOD (ROUTINE X 2)
Culture: NO GROWTH
Culture: NO GROWTH
Special Requests: ADEQUATE

## 2020-06-13 LAB — APTT: aPTT: 38 seconds — ABNORMAL HIGH (ref 24–36)

## 2020-06-13 LAB — PROTIME-INR
INR: 1.1 (ref 0.8–1.2)
Prothrombin Time: 13.7 seconds (ref 11.4–15.2)

## 2020-06-13 MED ORDER — PHYTONADIONE 5 MG PO TABS: 5.0000 mg | ORAL_TABLET | Freq: Every day | ORAL | 0 refills | Status: DC

## 2020-06-13 MED ORDER — HYPROMELLOSE (GONIOSCOPIC) 2.5 % OP SOLN: 1.0000 [drp] | Freq: Three times a day (TID) | OPHTHALMIC | 12 refills | Status: DC

## 2020-06-13 MED ORDER — ADULT MULTIVITAMIN W/MINERALS CH: 1.0000 | ORAL_TABLET | Freq: Every day | ORAL | 11 refills | Status: DC

## 2020-06-13 MED ORDER — FLUOXETINE HCL 10 MG PO CAPS: 10.0000 mg | ORAL_CAPSULE | Freq: Every day | ORAL | 2 refills | Status: DC

## 2020-06-13 MED ORDER — THIAMINE HCL 100 MG PO TABS: 100.0000 mg | ORAL_TABLET | Freq: Every day | ORAL | 11 refills | Status: DC

## 2020-06-13 MED ORDER — TRAMADOL HCL 50 MG PO TABS: 50.0000 mg | ORAL_TABLET | Freq: Four times a day (QID) | ORAL | 0 refills | Status: DC | PRN

## 2020-06-13 MED ORDER — PANTOPRAZOLE SODIUM 40 MG PO TBEC: 40.0000 mg | DELAYED_RELEASE_TABLET | Freq: Every day | ORAL | 0 refills | Status: DC

## 2020-06-13 MED ORDER — PREDNISONE 20 MG PO TABS: 40.0000 mg | ORAL_TABLET | Freq: Every day | ORAL | 0 refills | Status: DC

## 2020-06-13 MED ORDER — B COMPLEX-C PO TABS: 1.0000 | ORAL_TABLET | Freq: Every day | ORAL | 11 refills | Status: DC

## 2020-06-13 NOTE — Discharge Summary (Addendum)
Physician Discharge Summary  Glennie Bose ZYS:[id number] DOB: 01-16-2001 DOA: 06/02/2020  PCP: Pcp, No  Admit date: 06/02/2020 Discharge date: 06/13/2020  Time spent: 65 minutes  Recommendations for Outpatient Follow-up:  1. Complete vitamin K as per below 2. Steroids as per below 3. INR Chem-12 anti RO-anti-La ESR CRP in 1 week 4. Outpatient follow-up with [facility] rheumatology, Dr. Claiborne Billings of oncology and Dr. Benson Norway to be coordinated 5. Please follow-up liver biopsy performed 3/31 and report this to Dr. Benson Norway or Collene Mares of gastroenterology so that they may further guide planning regarding possible liver disease 6. Please follow GI pathogen panel although likely low yield-received 2 days of Zosyn without any growth on cultures done on 4/2   Discharge Diagnoses:  MAIN problem for hospitalization   Intracranial hemorrhage after accidental fall Transaminitis query cause-please follow liver biopsy   Please see below for itemized issues addressed in [place]- refer to other progress notes for clarity if needed  Discharge Condition: Fair  Diet recommendation: Regular  Filed Weights   06/09/20 0349 06/11/20 0335 06/13/20 0500  Weight: 50.7 kg 53.9 kg 47.5 kg    History of present illness:  20 year old female Hypothyroidism followed by Dr. Amedeo Kinsman Bell's palsy 09/02/2018 Rheumatoid arthritis follows previously on Rinvoq with transaminitis secondary to this 04/09/2020 Readmitted 06/02/2020 platelets 61 hemoglobin 9.6 and black-colored stool CTA chest CT abdomen pelvis showed severe fatty liver possible steatohepatitis  In the emergency room mechanical fall with multifocal acute subarachnoid blood and large parietal hematoma INR was 2.6 patient given FFP-FFP stopped Repeat CT head = multifocal new intracranial hemorrhage Neurosurgery consulted Hematology consulted, GI consulted, psychiatry consulted  Hospital Course:  Traumatic subarachnoid subdural hemorrhages in setting of  coagulopathy thrombocytopenia Neurosurgery signing off-patient neuro status stable 3/31 minimize CNS meds SIRS criterion without evidence for sepsis Had low-grade fevers multiple bouts of diarrhea on 4/2 with sore throat-Covid negative C. difficile negative PCR GI pathogen panel is pending on discharge-kindly follow  coagulopathy uncertain cause Pancytopenia now resolving?  Autoimmune disease-BM biopsy no blasts on final smear             Cytogenetics -normal blood karyotyping Vitamin K -per  hematology as well as steroids  appreciate Dr. Irene Limbo OP f/u -will cc Fatty liver ?  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 Pearline Cables on the patient's discharge so that she is aware Acute kidney injury Monitor trends Depression Continue Prozac started this admission Sick euthyroid Continue Synthroid   Procedures: Significant Events: 06/03/2000 admitted to the ICU for subarachnoid, subdural hemorrhage.   Significant studies: 06/03/2020 CT scan of the abdomen and pelvis showed fatty liver with hepatic steatosis small ascites. 05/14/2020 CT of the head new multifocal intracranial hemorrhage with expanding left scalp hematoma and new left-sided subdural hematoma, mild subarachnoid hemorrhage, with small midline shift. 06/04/2020 bone marrow biopsy 06/12/20 random liver biopsy  performed  Consultations:  Dr. Benson Norway gastroenterology  Psychiatry Dr. Dwyane Dee  Oncology Dr. Jenell Milliner  Discussed with Ms. Pearline Cables from [facility] rheumatology  Discharge Exam: Vitals:   06/13/20 0338 06/13/20 0756  BP: 112/66 124/81  Pulse: 93 (!) 104  Resp: 16 14  Temp: 98.7 F (37.1 C) 99.5 F (37.5 C)  SpO2: 100% 98%    Subj on day of d/c   Much better Breakfast remains untouched because she does not like the food here No other new issues no fevers chills diarrhea and was able to shower yesterday  General Exam on discharge  Asthenic female smiling CTA B no added sound Abdomen soft no rebound mild hepatomegaly ROM intact  No lower extremity edema Power 5/5  Discharge Instructions   Discharge Instructions    Care order/instruction   Complete by: As directed    Transfuse Parameters   Type and screen   Complete by: As directed    [place]      Allergies as of 06/13/2020      Reactions   Other Diarrhea, Other (See Comments)   Omega XL   Rinvoq [upadacitinib] Other (See Comments)   Enlarged the patient's liver- had to come to the ED, 2022      Medication List    STOP taking these medications   dicyclomine 20 MG tablet Commonly known as: BENTYL     TAKE these medications   B-complex with vitamin C tablet Take 1 tablet by mouth daily.   FLUoxetine 10 MG capsule Commonly known as: PROZAC Take 1 capsule (10 mg total) by mouth daily.   hydroxypropyl methylcellulose / hypromellose 2.5 % ophthalmic solution Commonly known as: ISOPTO TEARS / GONIOVISC Place 1 drop into both eyes 3 (three) times daily.   levothyroxine 75 MCG tablet Commonly known as: SYNTHROID Take 1 tablet (75 mcg total) by mouth daily.   multivitamin with minerals Tabs tablet Take 1 tablet by mouth daily.   pantoprazole 40 MG tablet Commonly known as: PROTONIX Take 1 tablet (40 mg total) by mouth at bedtime.   phytonadione 5 MG tablet Commonly known as: VITAMIN  K Take 1 tablet (5 mg total) by mouth daily for 10 days.   predniSONE 20 MG tablet Commonly known as: DELTASONE Take 2 tablets (40 mg total) by mouth daily with breakfast. Start taking on: June 14, 2020   thiamine 100 MG tablet Take 1 tablet (100 mg total) by mouth daily.   traMADol 50 MG tablet Commonly known as: ULTRAM Take 1 tablet (50 mg total) by mouth every 6 (six) hours as needed for up to 3 days for moderate pain.      Allergies  Allergen Reactions  . Other Diarrhea and Other (See Comments)    Omega XL  . Rinvoq [Upadacitinib] Other (See Comments)    Enlarged the patient's liver- had to come to the ED, 2022    Follow-up Information    Go to  [facility].   Contact information: [place] [id number] [place].   Specialty: Emergency Medicine Why: If symptoms worsen Contact information: [address] [id number] [place] [phone]               The results of significant diagnostics from this hospitalization (including imaging, microbiology, ancillary and laboratory) are listed below for reference.    Significant Diagnostic Studies: CT HEAD WO CONTRAST  Result Date: 06/05/2020 CLINICAL DATA:  Follow-up examination for stroke. EXAM: CT HEAD WITHOUT CONTRAST TECHNIQUE: Contiguous axial images were obtained from the base of the skull through the vertex without intravenous contrast. COMPARISON:  Prior CT from 08/04/2020. FINDINGS: Brain: Multiple intracranial hemorrhages again seen. Large hematoma at the right temporal lobe is not significantly changed measuring 2.9 x 2.6 x 6.5 cm, similar to previous when measured in a similar fashion. Localized mass effect and edema about this hemorrhage has mildly increased. Additional hemorrhage positioned at the parasagittal right frontal lobe also little interval  changed in size measuring 2.7 x 2.5 cm slightly increased edema about this hemorrhage with localized right-to-left shift. Additional right frontal hemorrhage slightly inferiorly is stable measuring 1.7 cm. Intraparenchymal hemorrhage at the parasagittal left frontal region is little interval changed in size measuring 3.7 x 2.6 cm. Localized edema has mildly increased. Additional scattered small volume subarachnoid hemorrhage little interval changed. Mild right-to-left midline shift is relatively stable. Stable ventricular size and morphology without hydrocephalus. No other new hemorrhage. No acute large vessel territory infarct. Vascular: No hyperdense vessel. Skull: Large evolving left posterior scalp contusion. Calvarium unchanged. Sinuses/Orbits: Globes orbital soft tissues demonstrate no new abnormality. Visualized paranasal sinuses and mastoid air cells remain largely clear. Other: None. IMPRESSION: 1. No significant interval change in size of multiple intracranial hemorrhages as above, although localized mass effect and edema about these hemorrhages has mildly increased. Mild right-to-left midline shift is relatively stable. No hydrocephalus or trapping. 2. No significant interval change in scattered small volume subarachnoid hemorrhage. 3. No other new acute intracranial abnormality. 4. Large evolving left posterior scalp contusion. Electronically Signed   By: Jeannine Boga M.D.   On: 06/05/2020 04:43   CT HEAD WO CONTRAST  Addendum Date: 06/04/2020   ADDENDUM REPORT: 06/04/2020 15:11 ADDENDUM: These results were called by telephone at the time of interpretation on 06/04/2020 at 3:11 pm to provider [facility] , who verbally acknowledged these results. Electronically Signed   By: Franchot Gallo M.D.   On: 06/04/2020 15:11   Result Date: 06/04/2020 CLINICAL DATA:  Head trauma.  Coagulopathy EXAM: CT HEAD WITHOUT CONTRAST TECHNIQUE: Contiguous axial images were obtained from the base of the skull  through the vertex without intravenous contrast. COMPARISON:  CT head 06/03/2020 FINDINGS: Brain: Multiple foci of cerebral hemorrhage have progressed since yesterday. Large hematoma in the right temporal lobe measuring approximately 6.0 X 2.2 cm. This has a mixture of high density and low-density blood. There is local mass-effect and edema. 17 mm adjacent right frontal hematoma also with progression. Right medial frontal high-density hematoma has enlarged now measuring 25 x 23 mm. Local mass-effect on the frontal horn. Left frontal parietal periventricular hematoma now measures 38 x 25 mm with significant interval enlargement. There is adjacent edema Mild subarachnoid hemorrhage again noted without progression. Ventricle size normal. Mild midline shift to the left. Vascular: Negative for hyperdense vessel Skull: Negative for skull fracture. Sinuses/Orbits: Prior sinus surgery. Mild mucosal edema left maxillary sinus. Negative orbit. Other: Large left parietal scalp hematoma has progressed with further hemorrhage in the scalp. IMPRESSION: 1. Multiple foci of intracranial hemorrhage have progressed since yesterday. Multiple large areas of hemorrhage in the right temporal lobe, right frontal lobe, and left frontal parietal lobe have all increased in size with surrounding edema and local mass-effect. Mild subarachnoid hemorrhage unchanged 2. Large left parietal scalp hematoma has enlarged since yesterday compatible with coagulopathy. Electronically Signed: By: Franchot Gallo M.D. On: 06/04/2020 14:57   CT HEAD WO CONTRAST  Addendum Date:  06/03/2020   ADDENDUM REPORT: 06/03/2020 09:16 ADDENDUM: Critical Value/emergent results were called by telephone at the time of interpretation on 06/03/2020 at 0904 hours to ED provider Linus Galas PA who verbally acknowledged these results. And she advises the patient is coagulopathic with an INR of 2.6. Electronically Signed   By: Genevie Ann M.D.   On: 06/03/2020 09:16   Result  Date: 06/03/2020 CLINICAL DATA:  20 year old female status post head trauma from fall with superior convexity subarachnoid hemorrhage. EXAM: CT HEAD WITHOUT CONTRAST TECHNIQUE: Contiguous axial images were obtained from the base of the skull through the vertex without intravenous contrast. COMPARISON:  Head CT 0115 hours today. FINDINGS: Brain: New small low-density left side subdural hematoma measures 3-4 mm in thickness (series 3, image 16). There is trace rightward midline shift now. Small volume subarachnoid hemorrhage mostly over the left superior convexity and along the interhemispheric fissure has mildly increased. Superimposed focal hemorrhagic contusions of the right cingulate gyrus (coronal image 21) and left superior frontal gyrus up to 3 cm (coronal image 36) are now apparent with mild regional edema. There is also a more subtle hemorrhagic contusion of the right anterior temporal tip suspected (series 3, image 7 and coronal image 24) with mild edema. Basilar cisterns remain normal. No intraventricular blood or ventriculomegaly. No superimposed acute cortically based infarct. Vascular: No suspicious intracranial vascular hyperdensity. Skull: No skull fracture identified. Sinuses/Orbits: Visualized paranasal sinuses and mastoids are stable and well pneumatized. Other: Expanded and now very large scalp hematoma tracking over most of the superior and left lateral convexity. Areas of hypodense likely hyperacute scalp hemorrhage on series 3, image 18 are new from earlier today. Hematoma thickness up to 16 mm. Visualized orbit soft tissues are within normal limits. IMPRESSION: 1. Multifocal new intracranial hemorrhage, in conjunction with large and expanding left scalp hematoma, raises the possibility of underlying Coagulopathy. 2. New left side subdural hematoma since 0115 hours, 3-4 mm in thickness. 3. Three cerebral hemorrhagic contusions now identified, the largest nearly 3 cm in the left superior frontal  gyrus (right cingulate and right anterior temporal tip also). 4. Mildly increased small volume of posttraumatic appearing subarachnoid hemorrhage. 5. Trace rightward midline shift now. Basilar cisterns remain normal. No IVH or ventriculomegaly. 6. No skull fracture identified. Electronically Signed: By: Genevie Ann M.D. On: 06/03/2020 08:47   CT Head Wo Contrast  Result Date: 06/03/2020 CLINICAL DATA:  Head trauma EXAM: CT HEAD WITHOUT CONTRAST TECHNIQUE: Contiguous axial images were obtained from the base of the skull through the vertex without intravenous contrast. COMPARISON:  None. FINDINGS: Brain: Multifocal acute subarachnoid blood, greatest over the superior parietal lobes. No midline shift or other mass effect. Vascular: No abnormal hyperdensity of the major intracranial arteries or dural venous sinuses. No intracranial atherosclerosis. Skull: Large left parietal scalp hematoma without skull fracture. Sinuses/Orbits: No fluid levels or advanced mucosal thickening of the visualized paranasal sinuses. No mastoid or middle ear effusion. The orbits are normal. IMPRESSION: 1. Multifocal acute subarachnoid blood, greatest over the superior parietal lobes. 2. Large left parietal scalp hematoma without skull fracture. Critical Value/emergent results were called by telephone at the time of interpretation on 06/03/2020 at 1:41 am to provider [facility] , who verbally acknowledged these results. Electronically Signed   By: Ulyses Jarred M.D.   On: 06/03/2020 01:45   CT Angio Chest PE W/Cm &/Or Wo Cm  Result Date: 06/02/2020 CLINICAL DATA:  20 year old female with abdominal pain and concern for pulmonary embolism. EXAM: CT ANGIOGRAPHY  CHEST CT ABDOMEN AND PELVIS WITH CONTRAST TECHNIQUE: Multidetector CT imaging of the chest was performed using the standard protocol during bolus administration of intravenous contrast. Multiplanar CT image reconstructions and MIPs were obtained to evaluate the vascular anatomy.  Multidetector CT imaging of the abdomen and pelvis was performed using the standard protocol during bolus administration of intravenous contrast. CONTRAST:  137mL OMNIPAQUE IOHEXOL 350 MG/ML SOLN COMPARISON:  Abdominal ultrasound dated 05/05/2020. FINDINGS: CTA CHEST FINDINGS Cardiovascular: There is no cardiomegaly or pericardial effusion. The thoracic aorta is unremarkable. The origins of the great vessels of the aortic arch appear patent. Faint linear hyperdensity along the left lateral wall of the pulmonary trunk (56/6) likely artifactual. No pulmonary artery embolus identified. Mediastinum/Nodes: There is no hilar or mediastinal adenopathy. The esophagus is grossly unremarkable. No mediastinal fluid collection. Lungs/Pleura: Bibasilar linear atelectasis/scarring. No focal consolidation, pleural effusion, or pneumothorax. The central airways are patent. Musculoskeletal: No chest wall abnormality. No acute or significant osseous findings. Review of the MIP images confirms the above findings. CT ABDOMEN and PELVIS FINDINGS No intra-abdominal free air.  Small ascites. Hepatobiliary: Severe fatty liver. The liver is enlarged measuring 19 cm in midclavicular length. Correlation with clinical exam and LFTs recommended to evaluate for steatohepatitis. No intrahepatic biliary ductal dilatation. The gallbladder is predominantly contracted. No calcified gallstone. Pancreas: Unremarkable. No pancreatic ductal dilatation or surrounding inflammatory changes. Spleen: Normal in size without focal abnormality. Adrenals/Urinary Tract: The adrenal glands are unremarkable. There is no hydronephrosis on either side. The visualized ureters appear unremarkable. The urinary bladder is minimally distended and grossly unremarkable. Stomach/Bowel: There is no bowel obstruction or active inflammation. The appendix is normal. Vascular/Lymphatic: The abdominal aorta and IVC are unremarkable. No portal venous gas. There is no adenopathy.  Reproductive: The uterus is anteverted and grossly unremarkable. No adnexal masses. Other: Mild diffuse subcutaneous edema. Musculoskeletal: No acute or significant osseous findings. Review of the MIP images confirms the above findings. IMPRESSION: 1. No acute intrathoracic pathology. No CT evidence of pulmonary embolism. 2. Severe fatty liver with findings of possible steatohepatitis. Clinical correlation is recommended. 3. Small ascites. 4. No bowel obstruction. Normal appendix. Electronically Signed   By: Anner Crete M.D.   On: 06/02/2020 21:26   CT ABDOMEN PELVIS W CONTRAST  Result Date: 06/02/2020 CLINICAL DATA:  20 year old female with abdominal pain and concern for pulmonary embolism. EXAM: CT ANGIOGRAPHY CHEST CT ABDOMEN AND PELVIS WITH CONTRAST TECHNIQUE: Multidetector CT imaging of the chest was performed using the standard protocol during bolus administration of intravenous contrast. Multiplanar CT image reconstructions and MIPs were obtained to evaluate the vascular anatomy. Multidetector CT imaging of the abdomen and pelvis was performed using the standard protocol during bolus administration of intravenous contrast. CONTRAST:  168mL OMNIPAQUE IOHEXOL 350 MG/ML SOLN COMPARISON:  Abdominal ultrasound dated 05/05/2020. FINDINGS: CTA CHEST FINDINGS Cardiovascular: There is no cardiomegaly or pericardial effusion. The thoracic aorta is unremarkable. The origins of the great vessels of the aortic arch appear patent. Faint linear hyperdensity along the left lateral wall of the pulmonary trunk (56/6) likely artifactual. No pulmonary artery embolus identified. Mediastinum/Nodes: There is no hilar or mediastinal adenopathy. The esophagus is grossly unremarkable. No mediastinal fluid collection. Lungs/Pleura: Bibasilar linear atelectasis/scarring. No focal consolidation, pleural effusion, or pneumothorax. The central airways are patent. Musculoskeletal: No chest wall abnormality. No acute or significant  osseous findings. Review of the MIP images confirms the above findings. CT ABDOMEN and PELVIS FINDINGS No intra-abdominal free air.  Small ascites. Hepatobiliary: Severe fatty liver.  The liver is enlarged measuring 19 cm in midclavicular length. Correlation with clinical exam and LFTs recommended to evaluate for steatohepatitis. No intrahepatic biliary ductal dilatation. The gallbladder is predominantly contracted. No calcified gallstone. Pancreas: Unremarkable. No pancreatic ductal dilatation or surrounding inflammatory changes. Spleen: Normal in size without focal abnormality. Adrenals/Urinary Tract: The adrenal glands are unremarkable. There is no hydronephrosis on either side. The visualized ureters appear unremarkable. The urinary bladder is minimally distended and grossly unremarkable. Stomach/Bowel: There is no bowel obstruction or active inflammation. The appendix is normal. Vascular/Lymphatic: The abdominal aorta and IVC are unremarkable. No portal venous gas. There is no adenopathy. Reproductive: The uterus is anteverted and grossly unremarkable. No adnexal masses. Other: Mild diffuse subcutaneous edema. Musculoskeletal: No acute or significant osseous findings. Review of the MIP images confirms the above findings. IMPRESSION: 1. No acute intrathoracic pathology. No CT evidence of pulmonary embolism. 2. Severe fatty liver with findings of possible steatohepatitis. Clinical correlation is recommended. 3. Small ascites. 4. No bowel obstruction. Normal appendix. Electronically Signed   By: Anner Crete M.D.   On: 06/02/2020 21:26   US BIOPSY (LIVER)  Result Date: 06/12/2020 INDICATION: 20 year old female with a history of fatty liver, referred medical liver biopsy EXAM: ULTRASOUND-GUIDED MEDICAL LIVER BIOPSY MEDICATIONS: None. ANESTHESIA/SEDATION: Moderate (conscious) sedation was employed during this procedure. A total of Versed 1 point mg and Fentanyl 50 mcg was administered intravenously. Moderate  Sedation Time: 10 minutes. The patient's level of consciousness and vital signs were monitored continuously by radiology nursing throughout the procedure under my direct supervision. FLUOROSCOPY TIME:  None COMPLICATIONS: None PROCEDURE: Informed written consent was obtained from the patient after a thorough discussion of the procedural risks, benefits and alternatives. All questions were addressed. Maximal Sterile Barrier Technique was utilized including caps, mask, sterile gowns, sterile gloves, sterile drape, hand hygiene and skin antiseptic. A timeout was performed prior to the initiation of the procedure. Ultrasound survey of the right liver lobe performed with images stored and sent to PACs. The right lower thorax/right upper abdomen was prepped with chlorhexidine in a sterile fashion, and a sterile drape was applied covering the operative field. A sterile gown and sterile gloves were used for the procedure. Local anesthesia was provided with 1% Lidocaine. The patient was prepped and draped sterilely and the skin and subcutaneous tissues were generously infiltrated with 1% lidocaine. A 17 gauge introducer needle was then advanced under ultrasound guidance in an intercostal location into the right liver lobe. The stylet was removed, and multiple separate 18 gauge core biopsy were retrieved. Samples were placed into formalin for transportation to the lab. Gel-Foam pledgets were then infused with a small amount of saline for assistance with hemostasis. The needle was removed, and a final ultrasound image was performed. The patient tolerated the procedure well and remained hemodynamically stable throughout. No complications were encountered and no significant blood loss was encounter. IMPRESSION: Status post ultrasound-guided medical liver biopsy. Signed, Dulcy Fanny. Dellia Nims, RPVI [facility] Radiology Electronically Signed   By: Corrie Mckusick D.O.   On: 06/12/2020  14:14   DG Abd Portable 1V  Result Date: 06/08/2020 CLINICAL DATA:  Feeding tube placement. EXAM: PORTABLE ABDOMEN - 1 VIEW COMPARISON:  None. FINDINGS: A small bore feeding tube is noted with tip in the peripyloric region. Bowel gas pattern is unremarkable. IMPRESSION: Small bore feeding tube with tip in the peripyloric region. Electronically Signed   By: Margarette Canada M.D.   On: 06/08/2020 11:39  EEG adult  Result Date: 06/04/2020 Raenette Rover, MD     06/04/2020  6:20 PM EEG Report Indication: possible seizure activity in setting of [place] This study was recorded in the comatose/not medically induced state.  The duration of the study was 27 minutes.  Electrodes were placed according to the International 10/20 system.  Video was reviewed available for clinical correlation as needed. There was no evidence of organization at any point, with no discernible anterior - posterior voltage or frequency gradient, posterior dominant rhythm or sleep architecture noted at any point. The background instead is comprised of almost exclusively low - moderate amplitude Delta range slowing in the 1 - 2 Hz range.  There was no clear reactivity/state change and minimal variability. There may have been somewhat lower amplitudes in the left hemisphere as compared to the right, although this was subtle. Hyperventilation: deferred Photic stimulation: deferred There are no clear paroxysmal or epileptiform abnormalities  or interhemispheric asymmetries. Impression: This is an abnormal study due to diffuse, low - moderate attitude Delta slowing with no organization and minimal evidence of reactivity. The background is continuous however. 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 There were slightly lower amplitudes in the left hemisphere  as compared to the right, particularly the posterior region, possibly suggestive of focal neuronal dysfunction, although this was subtle.  There are no clear  epileptiform abnormalities.   VAS [place] LOWER EXTREMITY VENOUS (DVT)  Result Date: 06/04/2020  Lower Venous DVT Study Indications: Edema.  Comparison Study: no prior Performing Technologist: Abram Sander RVS  Examination Guidelines: A complete evaluation includes B-mode imaging, spectral Doppler, color Doppler, and power Doppler as needed of all accessible portions of each vessel. Bilateral testing is considered an integral part of a complete examination. Limited examinations for reoccurring indications may be performed as noted. The reflux portion of the exam is performed with the patient in reverse Trendelenburg.  +---------+---------------+---------+-----------+----------+--------------+ RIGHT    CompressibilityPhasicitySpontaneityPropertiesThrombus Aging +---------+---------------+---------+-----------+----------+--------------+ CFV      Full           Yes      Yes                                 +---------+---------------+---------+-----------+----------+--------------+ SFJ      Full                                                        +---------+---------------+---------+-----------+----------+--------------+ FV Prox  Full                                                        +---------+---------------+---------+-----------+----------+--------------+ FV Mid   Full                                                        +---------+---------------+---------+-----------+----------+--------------+ FV DistalFull                                                        +---------+---------------+---------+-----------+----------+--------------+  PFV      Full                                                        +---------+---------------+---------+-----------+----------+--------------+ POP      Full           Yes      Yes                                 +---------+---------------+---------+-----------+----------+--------------+ PTV      Full                                                         +---------+---------------+---------+-----------+----------+--------------+ PERO     Full                                                        +---------+---------------+---------+-----------+----------+--------------+   +---------+---------------+---------+-----------+----------+--------------+ LEFT     CompressibilityPhasicitySpontaneityPropertiesThrombus Aging +---------+---------------+---------+-----------+----------+--------------+ CFV      Full           Yes      Yes                                 +---------+---------------+---------+-----------+----------+--------------+ SFJ      Full                                                        +---------+---------------+---------+-----------+----------+--------------+ FV Prox  Full                                                        +---------+---------------+---------+-----------+----------+--------------+ FV Mid   Full                                                        +---------+---------------+---------+-----------+----------+--------------+ FV DistalFull                                                        +---------+---------------+---------+-----------+----------+--------------+ PFV      Full                                                        +---------+---------------+---------+-----------+----------+--------------+  POP      Full           Yes      Yes                                 +---------+---------------+---------+-----------+----------+--------------+ PTV      Full                                                        +---------+---------------+---------+-----------+----------+--------------+ PERO     Full                                                        +---------+---------------+---------+-----------+----------+--------------+     Summary: BILATERAL: - No evidence of deep vein thrombosis seen in the lower  extremities, bilaterally. - No evidence of superficial venous thrombosis in the lower extremities, bilaterally. -No evidence of popliteal cyst, bilaterally.   *See table(s) above for measurements and observations. Electronically signed by Deitra Mayo MD on 06/04/2020 at 5:15:45 AM.    Final    US Abdomen Limited RUQ (LIVER/GB)  Result Date: 06/03/2020 CLINICAL DATA:  Ischemic hepatitis. EXAM: ULTRASOUND ABDOMEN LIMITED RIGHT UPPER QUADRANT COMPARISON:  CT abdomen pelvis 05/05/2020 FINDINGS: Gallbladder: The gallbladder appears to be contracted. No gallstones or wall thickening visualized. No sonographic Murphy sign noted by sonographer. Common bile duct: Diameter: 27mm Liver: Limited evaluation due to positioning. No focal lesion identified. Increased parenchymal echogenicity. Portal vein is patent on color Doppler imaging with normal direction of blood flow towards the liver. Other: None. IMPRESSION: Hepatic steatosis. Please note limited evaluation for focal hepatic masses in a patient with hepatic steatosis due to decreased penetration of the acoustic ultrasound waves. Also limited evaluation due to positioning. Recommend MRI liver protocol. Electronically Signed   By: Iven Finn M.D.   On: 06/03/2020 22:51    Microbiology: Recent Results (from the past 240 hour(s))  Culture, blood (routine x 2)     Status: None (Preliminary result)   Collection Time: 06/08/20  1:46 AM   Specimen: BLOOD RIGHT HAND  Result Value Ref Range Status   Specimen Description BLOOD RIGHT HAND  Final   Special Requests   Final    BOTTLES DRAWN AEROBIC AND ANAEROBIC Blood Culture adequate volume   Culture   Final    NO GROWTH 4 DAYS Performed at [facility], [place] [address] [place], [place] [address]    Report Status PENDING  Incomplete  Culture, blood (routine x 2)     Status: None (Preliminary result)   Collection Time: 06/08/20  1:56 AM   Specimen: BLOOD  Result Value Ref Range Status   Specimen  Description BLOOD RIGHT ANTECUBITAL  Final   Special Requests   Final    BOTTLES DRAWN AEROBIC AND ANAEROBIC Blood Culture results may not be optimal due to an inadequate volume of blood received in culture bottles   Culture   Final    NO GROWTH 4 DAYS Performed at [facility], [place] [address], [place], [place] [address]    Report Status PENDING  Incomplete  Labs: Basic Metabolic Panel: Recent Labs  Lab 06/06/20 1648 06/07/20 0458 06/07/20 0754 06/07/20 1649 06/08/20 0146 06/09/20 0456 06/10/20 0311 06/11/20 0512 06/12/20 0348  NA  --  136   < >  --  136 133* 137 137 137  K  --  3.6   < >  --  3.5 3.6 3.5 4.0 3.8  CL  --  104   < >  --  107 104 105 103 103  CO2  --  25   < >  --  $R'23 23 27 27 29  'qR$ GLUCOSE  --  95   < >  --  101* 89 83 85 80  BUN  --  6   < >  --  5* <5* <5* 5* 8  CREATININE  --  0.41*   < >  --  0.40* 0.38* 0.34* 0.46 0.37*  CALCIUM  --  7.9*   < >  --  7.8* 7.7* 7.9* 8.2* 8.3*  MG 2.0 2.0  --  2.0 1.9  --   --   --   --   PHOS 3.0 2.7  --  2.8 2.5  --   --   --   --    < > = values in this interval not displayed.   Liver Function Tests: Recent Labs  Lab 06/08/20 0146 06/09/20 0456 06/10/20 0311 06/11/20 0512 06/12/20 0348  AST 280* 167* 155* 147* 167*  ALT 122* 93* 94* 97* 117*  ALKPHOS 188* 142* 153* 161* 175*  BILITOT 0.9 QUANTITY NOT SUFFICIENT, UNABLE TO PERFORM TEST 0.6 0.9 0.8  PROT 6.5 6.2* 6.2* 6.6 6.7  ALBUMIN 1.9* 1.7* 1.7* 2.0* 2.0*   No results for input(s): LIPASE, AMYLASE in the last 168 hours. No results for input(s): AMMONIA in the last 168 hours. CBC: Recent Labs  Lab 06/07/20 0458 06/08/20 0146 06/09/20 0456 06/10/20 0311 06/11/20 0512  WBC 5.5 5.6 5.1 3.8* 4.6  HGB 10.1* 9.3* 9.2* 9.0* 9.9*  HCT 29.2* 28.6* 27.9* 28.6* 30.0*  MCV 92.1 94.1 93.0 96.0 94.0  PLT 168 130* 92* 104* 149*   Cardiac Enzymes: Recent Labs  Lab 06/07/20 0754  CKTOTAL 20*   BNP: BNP (last 3 results) No results for input(s): BNP  in the last 8760 hours.  ProBNP (last 3 results) No results for input(s): PROBNP in the last 8760 hours.  CBG: Recent Labs  Lab 06/09/20 1149 06/09/20 1543 06/10/20 0008 06/10/20 0529 06/10/20 0750  GLUCAP 143* 112* 114* 81 88       Signed:  Nita Sells MD   [facility] 06/13/2020, 10:40 AM

## 2020-06-13 NOTE — Progress Notes (Signed)
Inpatient Rehab Admissions Coordinator:   No update from insurance so would not be able to admit today.  If we hear over the weekend we could potentially admit.  Gayland Curry will f/u over the weekend if insurance returns a determination, otherwise I will f/u on Monday.   Shann Medal, PT, DPT Admissions Coordinator [phone] 06/13/20  4:41 PM

## 2020-06-13 NOTE — Progress Notes (Signed)
Physical Therapy Treatment Patient Details Name: Jessica Frazier MRN: [id number] DOB: [birth year] Today's Date: 06/13/2020    History of Present Illness 20 y/o female presented to ED on 3/21 with chief complaint of abdominal pain and black stools. In ED, patient fell in bathroom and hit head. Head CT found multifocal acute subarachnoid hemmorhage and L parietal hematoma without skull fx. Repeat head CT showed L hypodense subdural hematoma with new midline shift. Evaluating for coagulopathy. PMH: hypothyroidism, RA    PT Comments    Patient progressing with ambulation distance, however continues to require minA+2 and HHAx2 for balance. Patient demonstrates mild ataxia in LEs with mobility. Patient performed dynamic reaching tasks outside BOS with minA for balance and single UE support. Patient motivated to return to independence and hopeful for CIR. Continue to recommend comprehensive inpatient rehab (CIR) for post-acute therapy needs.    Follow Up Recommendations  CIR     Equipment Recommendations  Wheelchair (measurements PT);Wheelchair cushion (measurements PT);Rolling Daisean Brodhead with 5" wheels    Recommendations for Other Services       Precautions / Restrictions Precautions Precautions: Fall Restrictions Weight Bearing Restrictions: No    Mobility  Bed Mobility Overal bed mobility: Needs Assistance Bed Mobility: Supine to Sit     Supine to sit: Min assist     General bed mobility comments: minA for trunk elevation    Transfers Overall transfer level: Needs assistance Equipment used: 2 person hand held assist Transfers: Sit to/from Stand Sit to Stand: Min guard         General transfer comment: min guard for safety, no physical assist required  Ambulation/Gait Ambulation/Gait assistance: Min assist;+2 physical assistance;+2 safety/equipment Gait Distance (Feet): 75 Feet Assistive device: 2 person hand held assist Gait Pattern/deviations: Step-to  pattern;Decreased stride length;Ataxic;Drifts right/left;Narrow base of support;Decreased step length - right;Decreased step length - left Gait velocity: decreased   General Gait Details: slow step to pattern with minA+2 for balance. Able to navigate through uncontrolled environment with obstacles   Stairs             Wheelchair Mobility    Modified Rankin (Stroke Patients Only)       Balance Overall balance assessment: Needs assistance Sitting-balance support: Bilateral upper extremity supported Sitting balance-Leahy Scale: Fair Sitting balance - Comments: min guard for static sitting EOB   Standing balance support: Single extremity supported;During functional activity Standing balance-Leahy Scale: Poor Standing balance comment: Dynamic reaching tasks with minA for balance. Patient with difficulty reaching outside of BOS                            Cognition Arousal/Alertness: Awake/alert Behavior During Therapy: WFL for tasks assessed/performed Overall Cognitive Status: Impaired/Different from baseline Area of Impairment: Memory;Following commands;Problem solving                     Memory: Decreased short-term memory Following Commands: Follows one step commands inconsistently;Follows one step commands with increased time     Problem Solving: Slow processing;Difficulty sequencing;Requires verbal cues General Comments: patient with flat affect, requires increased time to process, initate, and follow commands.      Exercises Other Exercises Other Exercises: dynamic reaching activities with R and L outside BOS with minA for balance    General Comments        Pertinent Vitals/Pain Pain Assessment: No/denies pain    Home Living  Prior Function            PT Goals (current goals can now be found in the care plan section) Acute Rehab PT Goals Patient Stated Goal: to get better PT Goal Formulation: With  patient Time For Goal Achievement: 06/21/20 Potential to Achieve Goals: Fair Progress towards PT goals: Progressing toward goals    Frequency    Min 4X/week      PT Plan Current plan remains appropriate    Co-evaluation              AM-PAC PT "6 Clicks" Mobility   Outcome Measure  Help needed turning from your back to your side while in a flat bed without using bedrails?: A Little Help needed moving from lying on your back to sitting on the side of a flat bed without using bedrails?: A Little Help needed moving to and from a bed to a chair (including a wheelchair)?: A Little Help needed standing up from a chair using your arms (e.g., wheelchair or bedside chair)?: A Little Help needed to walk in hospital room?: A Little Help needed climbing 3-5 steps with a railing? : A Lot 6 Click Score: 17    End of Session Equipment Utilized During Treatment: Gait belt Activity Tolerance: Patient tolerated treatment well Patient left: in chair;with call bell/phone within reach;with chair alarm set;with family/visitor present Nurse Communication: Mobility status PT Visit Diagnosis: Unsteadiness on feet (R26.81);Other abnormalities of gait and mobility (R26.89);Muscle weakness (generalized) (M62.81);History of falling (Z91.81);Other symptoms and signs involving the nervous system (I77.824)     Time: 2353-6144 PT Time Calculation (min) (ACUTE ONLY): 29 min  Charges:  $Therapeutic Activity: 23-37 mins                     Jonai Weyland A. Gilford Rile PT, DPT Acute Rehabilitation Services Pager [phone] Office [phone]    Linna Hoff 06/13/2020, 4:01 PM

## 2020-06-13 NOTE — Progress Notes (Signed)
Subjective: No complaints.  Feeling well.  Objective: Vital signs in last 24 hours: Temp:  [98 F (36.7 C)-99.7 F (37.6 C)] 98.7 F (37.1 C) (04/01 0338) Pulse Rate:  [81-108] 93 (04/01 0338) Resp:  [15-28] 16 (04/01 0338) BP: (104-125)/(54-81) 112/66 (04/01 0338) SpO2:  [97 %-100 %] 100 % (04/01 0338) Weight:  [47.5 kg] 47.5 kg (04/01 0500) Last BM Date: 06/11/20  Intake/Output from previous day: 03/31 0701 - 04/01 0700 In: 440 [P.O.:440] Out: 775 [Urine:775] Intake/Output this shift: No intake/output data recorded.  General appearance: alert and no distress GI: Tender in the RUQ - unchanged.  Lab Results: Recent Labs    06/11/20 0512  WBC 4.6  HGB 9.9*  HCT 30.0*  PLT 149*   BMET Recent Labs    06/11/20 0512 06/12/20 0348  NA 137 137  K 4.0 3.8  CL 103 103  CO2 27 29  GLUCOSE 85 80  BUN 5* 8  CREATININE 0.46 0.37*  CALCIUM 8.2* 8.3*   LFT Recent Labs    06/12/20 0348  PROT 6.7  ALBUMIN 2.0*  AST 167*  ALT 117*  ALKPHOS 175*  BILITOT 0.8   PT/INR Recent Labs    06/12/20 0348 06/13/20 0408  LABPROT 13.7 13.7  INR 1.1 1.1   Hepatitis Panel No results for input(s): HEPBSAG, HCVAB, HEPAIGM, HEPBIGM in the last 72 hours. C-Diff No results for input(s): CDIFFTOX in the last 72 hours. Fecal Lactopherrin No results for input(s): FECLLACTOFRN in the last 72 hours.  Studies/Results: US BIOPSY (LIVER)  Result Date: 06/12/2020 INDICATION: 20 year old female with a history of fatty liver, referred medical liver biopsy EXAM: ULTRASOUND-GUIDED MEDICAL LIVER BIOPSY MEDICATIONS: None. ANESTHESIA/SEDATION: Moderate (conscious) sedation was employed during this procedure. A total of Versed 1 point mg and Fentanyl 50 mcg was administered intravenously. Moderate Sedation Time: 10 minutes. The patient's level of consciousness and vital signs were monitored continuously by radiology nursing throughout the procedure under my direct supervision. FLUOROSCOPY  TIME:  None COMPLICATIONS: None PROCEDURE: Informed written consent was obtained from the patient after a thorough discussion of the procedural risks, benefits and alternatives. All questions were addressed. Maximal Sterile Barrier Technique was utilized including caps, mask, sterile gowns, sterile gloves, sterile drape, hand hygiene and skin antiseptic. A timeout was performed prior to the initiation of the procedure. Ultrasound survey of the right liver lobe performed with images stored and sent to PACs. The right lower thorax/right upper abdomen was prepped with chlorhexidine in a sterile fashion, and a sterile drape was applied covering the operative field. A sterile gown and sterile gloves were used for the procedure. Local anesthesia was provided with 1% Lidocaine. The patient was prepped and draped sterilely and the skin and subcutaneous tissues were generously infiltrated with 1% lidocaine. A 17 gauge introducer needle was then advanced under ultrasound guidance in an intercostal location into the right liver lobe. The stylet was removed, and multiple separate 18 gauge core biopsy were retrieved. Samples were placed into formalin for transportation to the lab. Gel-Foam pledgets were then infused with a small amount of saline for assistance with hemostasis. The needle was removed, and a final ultrasound image was performed. The patient tolerated the procedure well and remained hemodynamically stable throughout. No complications were encountered and no significant blood loss was encounter. IMPRESSION: Status post ultrasound-guided medical liver biopsy. Signed, Dulcy Fanny. Dellia Nims, RPVI [facility] Electronically Signed   By: Corrie Mckusick D.O.   On: 06/12/2020 14:14  Medications:  Scheduled: . B-complex with vitamin C  1 tablet Oral Daily  . Chlorhexidine Gluconate Cloth  6 each Topical Daily  . cholecalciferol  1,000 Units Oral Daily  .  feeding supplement  237 mL Oral TID BM  . hydroxypropyl methylcellulose / hypromellose  1 drop Both Eyes TID  . levothyroxine  75 mcg Oral QAC breakfast  . mirtazapine  30 mg Oral QHS  . multivitamin with minerals  1 tablet Oral Daily  . pantoprazole  40 mg Oral QHS  . phytonadione  5 mg Oral Daily  . predniSONE  40 mg Oral Q breakfast  . thiamine  100 mg Oral Daily   Continuous: . sodium chloride 10 mL/hr at 06/06/20 1600    Assessment/Plan: 1) Abnormal liver enzymes. 2) S/p SAH. 3) RA.   She denies any problems with the liver biopsy yesterday.  From the hepatic standpoint she is stable and feeling well.  The liver biopsy results will be reviewed and this can be handled as an outpatient.  Plan: 1) Follow up liver biopsy results as an outpatient. 2) Signing off.  LOS: 10 days   Jessica Frazier D 06/13/2020, 7:25 AM

## 2020-06-13 NOTE — PMR Pre-admission (Signed)
PMR Admission Coordinator Pre-Admission Assessment  Patient: Jessica Frazier is an 20 y.o., female MRN: [id number] DOB: 03/18/2000 Height: _0  (157.5 cm) Weight: 48.2 kg  Insurance Information HMO:     PPO:      PCP:      IPA:      80/20:      OTHER:  PRIMARY: [place]      Policy#: [id number]      Subscriber: pt CM Name: n/a      Phone#: n/a     Fax#: [phone] Pre-Cert#: [id number] auth for CIR provided by fax with updates due to fax listed above on 3/7.      Employer:  Benefits:  Phone #: [phone]     Name:  Eff. Date: 05/14/2019     Deduct: $0      Out of Pocket Max: $1600 ($0 met)      Life Max: n/a CIR: 90%      SNF: 90% Outpatient:      Co-Ins: 10% Home Health: 90%      Co-Ins: 10% DME: 90%     Co-Ins: 10% Providers:  SECONDARY:       Policy#:      Phone#:   Development worker, community:       Phone#:   The Therapist, art Information Summary" for patients in Inpatient Rehabilitation Facilities with attached "Privacy Act [place] Records" was provided and verbally reviewed with: N/A  Emergency Contact Information Contact Information    Name Relation Home Work Mobile   Prieto,Gabriela Mother   [phone]      Current Medical History  Patient Admitting Diagnosis: [place]   History of Present Illness: Jessica Frazier is a 20 y.o. female with history of RA, Bell's palsy, hypothyroidism, abnormal LFTs felt to be due to [place] who was admitted on 06/02/20 with poor po intake, weakness, muscle wasting, BLE edema and epigastric pain. She was found to have worsening of pancytopenia with black stools, and while in ED she sustained a fall with multifocal acute SAH predominantly over parietal lobes.   She was also found to have severe fatty liver with steatohepatitis,  coagulopathy with INR-2.6 and FFP ordered but d/c due to fever.  Dr. Arnoldo Morale consulted and recommended monitoring with serial CT head as patient neurologically stable. DR. Irene Limbo recommended  vitamin K with platelets to keep plt>50,000. Bone marrow biopsy done for work up and path pending.   She did have progression of ICH with increase in size, edema and mass effect as well as enlargement of large left parietal scalp hematoma. She was not felt to be a surgical candidate and medical management recommended as neurologically stable. Dr. Demaris Callander consulted for input due to concerns of depression and anorexia but felt that weight loss due to diarrhea and vomiting rather than a psychiatric disorder. Dr. Benson Norway following for input and recommended AIH work up as LFTs remained persistently elevated and plans for liver  biopsy. She continues to have impairments due to weakness with balance deficits as well as cognitive deficits with delay in processing, memory and ability to follow simple commands. CIR recommended due to functional decline.      Patient's medical record from [facility] has been reviewed by the rehabilitation admission coordinator and physician.  Past Medical History  Past Medical History:  Diagnosis Date  . Bell's palsy   . Hypothyroidism   . Rheumatoid arthritis ([place])     Family History   family history includes Diabetes in her maternal grandfather;  Graves' disease in her mother; Healthy in her father.  Prior Rehab/Hospitalizations Has the patient had prior rehab or hospitalizations prior to admission? No  Has the patient had major surgery during 100 days prior to admission? No   Current Medications  Current Facility-Administered Medications:  .  0.9 %  sodium chloride infusion, , Intravenous, PRN, Bowser, Laurel Dimmer, NP, Last Rate: 10 mL/hr at 06/06/20 1600, Infusion Verify at 06/06/20 1600 .  acetaminophen (TYLENOL) tablet 650 mg, 650 mg, Oral, Q6H PRN, Candee Furbish, MD, 650 mg at 06/14/20 0420 .  B-complex with vitamin C tablet 1 tablet, 1 tablet, Oral, Daily, Brunetta Genera, MD, 1 tablet at 06/16/20 0905 .  Chlorhexidine Gluconate Cloth 2 % PADS 6 each, 6  each, Topical, Daily, Candee Furbish, MD, 6 each at 06/16/20 [phone] .  cholecalciferol (VITAMIN D3) tablet 1,000 Units, 1,000 Units, Oral, Daily, Charlynne Cousins, MD, 1,000 Units at 06/16/20 0905 .  feeding supplement (ENSURE ENLIVE / ENSURE PLUS) liquid 237 mL, 237 mL, Oral, TID BM, Charlynne Cousins, MD, 237 mL at 06/16/20 0905 .  heparin lock flush 100 unit/mL, 500 Units, Intracatheter, Daily PRN, Brunetta Genera, MD .  heparin lock flush 100 unit/mL, 250 Units, Intracatheter, PRN, Irene Limbo, Cloria Spring, MD .  hydroxypropyl methylcellulose / hypromellose (ISOPTO TEARS / GONIOVISC) 2.5 % ophthalmic solution 1 drop, 1 drop, Both Eyes, TID, Nita Sells, MD, 1 drop at 06/16/20 0909 .  levothyroxine (SYNTHROID) tablet 75 mcg, 75 mcg, Oral, QAC breakfast, Candee Furbish, MD, 75 mcg at 06/16/20 0610 .  mirtazapine (REMERON) tablet 30 mg, 30 mg, Oral, QHS, Charlynne Cousins, MD, 30 mg at 06/15/20 2147 .  multivitamin with minerals tablet 1 tablet, 1 tablet, Oral, Daily, Charlynne Cousins, MD, 1 tablet at 06/16/20 0906 .  ondansetron (ZOFRAN) injection 4 mg, 4 mg, Intravenous, Q6H PRN, Ogan, [place]wo U, MD .  phenol (CHLORASEPTIC) mouth spray 1 spray, 1 spray, Mouth/Throat, PRN, Candee Furbish, MD, 1 spray at 06/09/20 0809 .  [COMPLETED] phytonadione (VITAMIN K) tablet 10 mg, 10 mg, Oral, Daily, 10 mg at 06/09/20 1046 **FOLLOWED BY** phytonadione (VITAMIN K) tablet 5 mg, 5 mg, Oral, Daily, Bowser, Grace E, NP, 5 mg at 06/16/20 0906 .  potassium chloride SA (KLOR-CON) CR tablet 40 mEq, 40 mEq, Oral, Daily, Verlon Au, Jai-Gurmukh, MD, 40 mEq at 06/16/20 0905 .  predniSONE (DELTASONE) tablet 40 mg, 40 mg, Oral, Q breakfast, Jennelle Human B, NP, 40 mg at 06/16/20 0906 .  sodium chloride flush (NS) 0.9 % injection 10 mL, 10 mL, Intracatheter, PRN, Brunetta Genera, MD .  thiamine tablet 100 mg, 100 mg, Oral, Daily, Charlynne Cousins, MD, 100 mg at 06/16/20 3953  Patients  Current Diet:  Diet Order            Diet regular Room service appropriate? Yes; Fluid consistency: Thin  Diet effective now                 Precautions / Restrictions Precautions Precautions: Fall Precaution Comments: cortrak Restrictions Weight Bearing Restrictions: No   Has the patient had 2 or more falls or a fall with injury in the past year? Yes  Prior Activity Level Community (5-7x/wk): independent prior to admit with no DME, has been on disability since RA diagnosis, driving  Prior Functional Level Self Care: Did the patient need help bathing, dressing, using the toilet or eating? Independent  Indoor Mobility: Did the patient need assistance with walking from  room to room (with or without device)? Independent  Stairs: Did the patient need assistance with internal or external stairs (with or without device)? Independent  Functional Cognition: Did the patient need help planning regular tasks such as shopping or remembering to take medications? Independent  Home Assistive Devices / Equipment    Prior Device Use: Indicate devices/aids used by the patient prior to current illness, exacerbation or injury? None of the above  Current Functional Level Cognition  Overall Cognitive Status: Impaired/Different from baseline Orientation Level: Oriented X4 Following Commands: Follows one step commands with increased time Safety/Judgement: Decreased awareness of safety,Decreased awareness of deficits General Comments: flat affect, increased time to complete mobility tasks when cued. Pt appears to struggle with sequencing tasks, requires step-by-step instruction for basic tasks (ie scooting back in recliner, STS from chair with armrests). Pt can be self-limiting, stating "I can't" before attempting tasks.    Extremity Assessment (includes Sensation/Coordination)  Upper Extremity Assessment: Generalized weakness,RUE deficits/detail,LUE deficits/detail RUE Deficits / Details: 2-/5  MM grade; poor grip; very little AROM noted RUE: Unable to fully assess due to pain RUE Sensation: decreased light touch RUE Coordination: decreased fine motor,decreased gross motor LUE Deficits / Details: 2/5 MM grade; poor grip; very little AROM noted LUE: Unable to fully assess due to pain LUE Sensation: decreased light touch LUE Coordination: decreased fine motor,decreased gross motor  Lower Extremity Assessment: Generalized weakness RLE Deficits / Details: decreased AROM, very weak RLE Sensation: decreased light touch,decreased proprioception RLE Coordination: decreased fine motor,decreased gross motor LLE Deficits / Details: decreased AROM, very weak LLE Sensation: decreased light touch,decreased proprioception LLE Coordination: decreased fine motor,decreased gross motor    ADLs  Overall ADL's : Needs assistance/impaired Eating/Feeding: Total assistance Grooming: Set up,Sitting,Oral care Grooming Details (indicate cue type and reason): seated in recliner to brush teeth with supervision Upper Body Bathing: Total assistance,Bed level Lower Body Bathing: Total assistance,Bed level Upper Body Dressing : Moderate assistance,Sitting Lower Body Dressing: Total assistance,Bed level Toilet Transfer: Minimal assistance,+2 for physical assistance,+2 for safety/equipment,Ambulation Toilet Transfer Details (indicate cue type and reason): simulated to recliner Toileting- Clothing Manipulation and Hygiene: Total assistance Toileting - Clothing Manipulation Details (indicate cue type and reason): peeing on side of bed, aware, but did not make sure that purewick was in place Functional mobility during ADLs: Minimal assistance,+2 for physical assistance,+2 for safety/equipment,Cueing for safety General ADL Comments: Pt severely limited by decreased strength, decreased activity tolerance, decreased ability to care for self and decreased cognition. Pt following 75% of commands with increased time;  cues to open eyes and leave open upon arousal. Pt unable to grip a utensil to feed self.    Mobility  Overal bed mobility: Needs Assistance Bed Mobility: Supine to Sit Rolling: Total assist,+2 for physical assistance Supine to sit: Min assist Sit to supine: Total assist,+2 for physical assistance,+2 for safety/equipment General bed mobility comments: minA for trunk elevation, scooting to EOB with use of bed pads.    Transfers  Overall transfer level: Needs assistance Equipment used: 2 person hand held assist Transfers: Sit to/from Stand Sit to Stand: Min assist,+2 physical assistance General transfer comment: min +2 for power up, rise, steadying especially from low surface. STS x5, from various surfaces during session, struggles more with lower surfaces like toilet.    Ambulation / Gait / Stairs / Wheelchair Mobility  Ambulation/Gait Ambulation/Gait assistance: Min assist,+2 physical assistance,+2 safety/equipment Gait Distance (Feet): 80 Feet (+10+10) Assistive device: 1 person hand held assist Gait Pattern/deviations: Decreased stride length,Ataxic,Drifts right/left,Narrow base  of support,Decreased step length - right,Decreased step length - left,Step-through pattern General Gait Details: min assist to steady, LOB x3 requiring physical assist via gait belt to correct with changing directions both 90 and 180 degree turns. Pt with mild weaving of gait and alternating width BOS, cuing for increasing step length and upright posture. Gait velocity: decr    Posture / Balance Dynamic Sitting Balance Sitting balance - Comments: min guard for static sitting EOB Balance Overall balance assessment: Needs assistance Sitting-balance support: Bilateral upper extremity supported Sitting balance-Leahy Scale: Fair Sitting balance - Comments: min guard for static sitting EOB Postural control: Other (comment) (varying directions) Standing balance support: Single extremity supported,During functional  activity Standing balance-Leahy Scale: Poor Standing balance comment: short distance gait without UE support, intermittent HHA required to steady self.    Special needs/care consideration n/a   Previous Home Environment (from acute therapy documentation) Living Arrangements: Parent,Other relatives (siblings) Type of Home: House Additional Comments: little information given by patient  Discharge Living Setting Plans for Discharge Living Setting: Patient's home,Lives with (comment) (parents) Type of Home at Discharge: House Discharge Home Layout: One level,Laundry or work area in basement Discharge Home Access: Stairs to enter Entrance Stairs-Rails: None Technical brewer of Steps: 1 Discharge Bathroom Shower/Tub: Tub/shower unit Discharge Bathroom Toilet: Standard Discharge Bathroom Accessibility: Yes How Accessible: Accessible via walker Does the patient have any problems obtaining your medications?: No  Social/Family/Support Systems Anticipated Caregiver: mom, Gabriela Anticipated Ambulance person Information: [phone] (needs spanish interpreter) Ability/Limitations of Caregiver: n/a Caregiver Availability: 24/7 Discharge Plan Discussed with Primary Caregiver: Yes Is Caregiver In Agreement with Plan?: Yes Does Caregiver/Family have Issues with Lodging/Transportation while Pt is in Rehab?: No  Goals Patient/Family Goal for Rehab: PT/OT supervision to mod I, SLP n/a Expected length of stay: 9-12 days Cultural Considerations: mom will need interpreter, but pt understands English Pt/Family Agrees to Admission and willing to participate: Yes Program Orientation Provided & Reviewed with Pt/Caregiver Including Roles  & Responsibilities: Yes  Barriers to Discharge: Insurance for SNF coverage  Decrease burden of Care through IP rehab admission:  N/a   Possible need for SNF placement upon discharge:  Not anticipated.  Plan for home with mother to provide care as needed.    Patient Condition: I have reviewed medical records from [facility], spoken with CM, and patient. I met with patient at the bedside for inpatient rehabilitation assessment.  Patient will benefit from ongoing PT and OT, can actively participate in 3 hours of therapy a day 5 days of the week, and can make measurable gains during the admission.  Patient will also benefit from the coordinated team approach during an Inpatient Acute Rehabilitation admission.  The patient will receive intensive therapy as well as Rehabilitation physician, nursing, social worker, and care management interventions.  Due to safety, disease management, medication administration, pain management and patient education the patient requires 24 hour a day rehabilitation nursing.  The patient is currently min assist with mobility and basic ADLs.  Discharge setting and therapy post discharge at home with home health is anticipated.  Patient has agreed to participate in the Acute Inpatient Rehabilitation Program and will admit today.  Preadmission Screen Completed By:  Michel Santee, 06/16/2020 12:52 PM ______________________________________________________________________   Discussed status with Dr. Letta Pate on 06/16/20  at 12:52 PM  and received approval for admission today.  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 Does the need for close, 24 hr/day Medical supervision in concert with the patient's rehab needs make it unreasonable for this patient to be served in a less intensive setting? Yes 2. Co-Morbidities requiring supervision/potential complications: Rheumatoid arthritis, hepatitis 3. Due to bladder management, bowel management, safety, skin/wound care, disease management, medication administration, pain management and patient education, does the patient require 24 hr/day rehab nursing?  Yes 4. Does the patient require coordinated care of a physician, rehab nurse, PT, OT, and SLP to address physical and functional deficits in the context of the above medical diagnosis(es)? Yes Addressing deficits in the following areas: balance, endurance, locomotion, strength, transferring, bowel/bladder control, bathing, dressing, feeding, cognition and psychosocial support 5. Can the patient actively participate in an intensive therapy program of at least 3 hrs of therapy 5 days a week? Yes 6. The potential for patient to make measurable gains while on inpatient rehab is good 7. Anticipated functional outcomes upon discharge from inpatient rehab: modified independent PT, modified independent OT, modified independent SLP 8. Estimated rehab length of stay to reach the above functional goals is: 9 to 12 days 9. Anticipated discharge destination: Home 10. Overall Rehab/Functional Prognosis: good   MD Signature: Charlett Blake M.D. [facility] [employer] Fellow Am Acad of [employer] Med and Rehab Diplomate Am Board of Electrodiagnostic Med Fellow Am Board of Interventional Pain

## 2020-06-13 NOTE — H&P (Shared)
Physical Medicine and Rehabilitation Admission H&P    CC: TBI with functional deficits   HPI: Jessica Frazier is a 20 year old female with history of seronegative RA, Bell's palsy, hypothyroid, abnormal LFTs felt to be due Rinvoq who was admitted on 06/02/20 with poor po intake, muscle wasting,  epigastric pain and BLE edema. She was found to have black stools with pancytopenia.  She sustained a fall in ED and was found to have multifocal acute SAH predominantly over parietal lobes.  She was also found to have severe fatty liver with steatohepatitis, coagulopathy with INR 2.6 and FFP was ordered but discontinued due to fever.  Dr. Arnoldo Morale was consulted for input and recommended monitoring with serial CT of head as patient was neurologically stable. Dr. Irene Limbo recommended continuing vitamin K for 2 weeks, to monitor PT/INR/Fibrinogen, transfuse platelets if less than 50, INR 1.5 or fibrinogen <200 as well as taper steroids over 2 weeks.    Bone biopsy was done for work-up and revealed pancytopenia with normocellular bone marrow with trilineage hematopoiesis including megakaryocytes--questioned to be secondary to RA/liver disease.  She did have progression of ICH with increase in size, edema and mass-effect with enlargement of large left parietal scalp hematoma.  She was not felt to be a surgical candidate and medical management recommended as she was neurologically stable.  Dr. Dagar/psychiatry was consulted for input due to concerns of depression and anorexia but felt that weight loss was due to diarrhea and vomiting rather than psychiatric disorder.  Dr. Allene Pyo  recommended I need to work-up his LFTs remain persistently elevated and she underwent ultrasound-guided biopsy of liver by Dr. Earleen Newport on 03/31.  She developed fever with lactic acidosis on 04/02 --question sepsis and treated with fluids and antibiotics briefly with improvement.  Antibiotics d/c 04/03 and she has been for the past 24  hours. She continues to be limited by abdominal pain with weakness, balance deficits as well as delayed processing.  CIR recommended due to functional decline.   Review of Systems  Constitutional: Negative for chills and fever.  HENT: Negative for hearing loss.   Eyes: Negative for blurred vision and double vision.  Respiratory: Positive for cough (mild ). Negative for shortness of breath.   Cardiovascular: Negative for chest pain and leg swelling.  Gastrointestinal: Negative for abdominal pain (better/almost resolved) and constipation.  Genitourinary: Negative for dysuria and frequency.  Musculoskeletal: Positive for joint pain and myalgias.  Skin: Negative for rash.  Neurological: Positive for weakness and headaches. Negative for dizziness and sensory change.  Psychiatric/Behavioral: Negative for memory loss. The patient is not nervous/anxious.      Past Medical History:  Diagnosis Date  . Bell's palsy   . Hypothyroidism   . Rheumatoid arthritis ([place])     History reviewed. No pertinent surgical history.    Family History  Problem Relation Age of Onset  . Graves' disease Mother   . Healthy Father   . Diabetes Maternal Grandfather     Social History: Lives with parents. Independent --"stays in bed all day" or goes out with mother. Sedentary for past 5 years due to "sickness". She  reports that she has never smoked. She has never used smokeless tobacco. She reports previous alcohol use. She reports that she does not use drugs.     Allergies  Allergen Reactions  . Other Diarrhea and Other (See Comments)    Omega XL  . Rinvoq [Upadacitinib] Other (See Comments)    Enlarged the patient's  liver- had to come to the ED, 2022    Medications Prior to Admission  Medication Sig Dispense Refill  . levothyroxine (SYNTHROID) 75 MCG tablet Take 1 tablet (75 mcg total) by mouth daily. 90 tablet 3  . dicyclomine (BENTYL) 20 MG tablet Take 1 tablet (20 mg total) by mouth 2 (two) times  daily for 5 days. (Patient not taking: Reported on 06/02/2020) 10 tablet 0    Drug Regimen Review { DRUG REGIMEN CZYSAY:30160}  Home: Home Living Family/patient expects to be discharged to:: Private residence Living Arrangements: Parent,Other relatives (siblings) Type of Home: House Additional Comments: little information given by patient   Functional History: Prior Function Level of Independence: Needs assistance Gait / Transfers Assistance Needed: Patient states she gets her mom's help when she needs to get up and walk ADL's / Homemaking Assistance Needed: Needs help with bathing and dressing due to weakness  Functional Status:  Mobility: Bed Mobility Overal bed mobility: Needs Assistance Bed Mobility: Supine to Sit Rolling: Total assist,+2 for physical assistance Supine to sit: Min assist Sit to supine: Total assist,+2 for physical assistance,+2 for safety/equipment General bed mobility comments: minA for trunk elevation, scooting to EOB with use of bed pads. Transfers Overall transfer level: Needs assistance Equipment used: 2 person hand held assist Transfers: Sit to/from Stand Sit to Stand: Min assist,+2 physical assistance General transfer comment: min +2 for power up, rise, steadying especially from low surface. STS x5, from various surfaces during session, struggles more with lower surfaces like toilet. Ambulation/Gait Ambulation/Gait assistance: Min assist,+2 physical assistance,+2 safety/equipment Gait Distance (Feet): 80 Feet (+10+10) Assistive device: 1 person hand held assist Gait Pattern/deviations: Decreased stride length,Ataxic,Drifts right/left,Narrow base of support,Decreased step length - right,Decreased step length - left,Step-through pattern General Gait Details: min assist to steady, LOB x3 requiring physical assist via gait belt to correct with changing directions both 90 and 180 degree turns. Pt with mild weaving of gait and alternating width BOS, cuing  for increasing step length and upright posture. Gait velocity: decr    ADL: ADL Overall ADL's : Needs assistance/impaired Eating/Feeding: Total assistance Grooming: Wash/dry hands,Brushing hair,Standing,Min guard,Minimal assistance Grooming Details (indicate cue type and reason): standing at sink with min guard assist- MIN A for balance and safety Upper Body Bathing: Total assistance,Bed level Lower Body Bathing: Total assistance,Sit to/from stand Lower Body Bathing Details (indicate cue type and reason): simulated via posterior pericare Upper Body Dressing : Minimal assistance,Sitting Upper Body Dressing Details (indicate cue type and reason): to don posterior gown Lower Body Dressing: Total assistance,Bed level Toilet Transfer: Minimal assistance,+2 for physical assistance,+2 for safety/equipment,Ambulation,Regular Toilet,Grab bars Toilet Transfer Details (indicate cue type and reason): cues to reach back and for safety awareness when ascending<>descending onto sitting surface Toileting- Clothing Manipulation and Hygiene: Sit to/from stand,Total assistance Toileting - Clothing Manipulation Details (indicate cue type and reason): pt declined attempting to complete posterior pericare d/t fatigue, total A needed Functional mobility during ADLs: Minimal assistance,+2 for physical assistance,+2 for safety/equipment,Cueing for safety General ADL Comments: pt continues to present with impaired balance, decreased activity tolerance and generalized weakness. pt able to complete functional mobiltiy, toileting tasks and grooming tasks at sink  Cognition: Cognition Overall Cognitive Status: Impaired/Different from baseline Orientation Level: Oriented X4 Cognition Arousal/Alertness: Awake/alert Behavior During Therapy: Flat affect Overall Cognitive Status: Impaired/Different from baseline Area of Impairment: Following commands,Problem solving,Safety/judgement,Awareness Memory: Decreased  short-term memory Following Commands: Follows one step commands with increased time Safety/Judgement: Decreased awareness of safety,Decreased awareness of deficits Awareness: Emergent Problem  Solving: Slow processing,Difficulty sequencing,Requires verbal cues,Decreased initiation General Comments: pt very falt during session, requries increased time to follow commands and needs step by step cues to sequence ADLs and mobility tasks   Blood pressure 131/90, pulse 99, temperature 99.7 F (37.6 C), temperature source Oral, resp. rate 18, height 5\' 2"  (1.575 m), weight 48.2 kg, SpO2 100 %. Physical Exam Constitutional:      Appearance: She is well-developed.     Comments: Thin female. Brighter and more appropriate today.   Abdominal:     General: There is no distension.     Tenderness: There is no abdominal tenderness.  Neurological:     Mental Status: She is alert and oriented to person, place, and time.     Comments: Speech clear. Able to follow commands without difficulty.      Results for orders placed or performed during the hospital encounter of 06/02/20 (from the past 48 hour(s))  Protime-INR     Status: None   Collection Time: 06/15/20  1:23 AM  Result Value Ref Range   Prothrombin Time 14.2 11.4 - 15.2 seconds   INR 1.1 0.8 - 1.2    Comment: (NOTE) INR goal varies based on device and disease states. Performed at [facility], [place] [address], [place], [place] [address]   APTT     Status: Abnormal   Collection Time: 06/15/20  1:23 AM  Result Value Ref Range   aPTT 38 (H) 24 - 36 seconds    Comment:        IF BASELINE aPTT IS ELEVATED, SUGGEST PATIENT RISK ASSESSMENT BE USED TO DETERMINE APPROPRIATE ANTICOAGULANT THERAPY. Performed at [facility], [place] [address], [place], [place] [address]   Procalcitonin     Status: None   Collection Time: 06/15/20  1:23 AM  Result Value Ref Range   Procalcitonin 0.19 ng/mL    Comment:        Interpretation: PCT  (Procalcitonin) <= 0.5 ng/mL: Systemic infection (sepsis) is not likely. Local bacterial infection is possible. (NOTE)       Sepsis PCT Algorithm           Lower Respiratory Tract                                      Infection PCT Algorithm    ----------------------------     ----------------------------         PCT < 0.25 ng/mL                PCT < 0.10 ng/mL          Strongly encourage             Strongly discourage   discontinuation of antibiotics    initiation of antibiotics    ----------------------------     -----------------------------       PCT 0.25 - 0.50 ng/mL            PCT 0.10 - 0.25 ng/mL               OR       >80% decrease in PCT            Discourage initiation of  antibiotics      Encourage discontinuation           of antibiotics    ----------------------------     -----------------------------         PCT >= 0.50 ng/mL              PCT 0.26 - 0.50 ng/mL               AND        <80% decrease in PCT             Encourage initiation of                                             antibiotics       Encourage continuation           of antibiotics    ----------------------------     -----------------------------        PCT >= 0.50 ng/mL                  PCT > 0.50 ng/mL               AND         increase in PCT                  Strongly encourage                                      initiation of antibiotics    Strongly encourage escalation           of antibiotics                                     -----------------------------                                           PCT <= 0.25 ng/mL                                                 OR                                        > 80% decrease in PCT                                      Discontinue / Do not initiate                                             antibiotics  Performed at [facility], [place] [address] [place], [place] [address]   CBC with  Differential/Platelet     Status: Abnormal  Collection Time: 06/15/20  9:20 AM  Result Value Ref Range   WBC 5.0 4.0 - 10.5 K/uL   RBC 3.41 (L) 3.87 - 5.11 MIL/uL   Hemoglobin 10.5 (L) 12.0 - 15.0 g/dL    Comment: QUESTIONABLE RESULTS, called to Cyndi Lennert RN [place] [phone] by Wendall Stade. Will recollect to confirm.   HCT 33.1 (L) 36.0 - 46.0 %   MCV 97.1 80.0 - 100.0 fL   MCH 30.8 26.0 - 34.0 pg   MCHC 31.7 30.0 - 36.0 g/dL   RDW 18.5 (H) 11.5 - 15.5 %   Platelets 230 150 - 400 K/uL   nRBC 0.0 0.0 - 0.2 %   Neutrophils Relative % 65 %   Neutro Abs 3.2 1.7 - 7.7 K/uL   Lymphocytes Relative 30 %   Lymphs Abs 1.5 0.7 - 4.0 K/uL   Monocytes Relative 4 %   Monocytes Absolute 0.2 0.1 - 1.0 K/uL   Eosinophils Relative 0 %   Eosinophils Absolute 0.0 0.0 - 0.5 K/uL   Basophils Relative 0 %   Basophils Absolute 0.0 0.0 - 0.1 K/uL   Immature Granulocytes 1 %   Abs Immature Granulocytes 0.07 0.00 - 0.07 K/uL    Comment: Performed at [facility] [address] [place], [place] [address]  Comprehensive metabolic panel     Status: Abnormal   Collection Time: 06/15/20  9:20 AM  Result Value Ref Range   Sodium 137 135 - 145 mmol/L   Potassium 3.6 3.5 - 5.1 mmol/L   Chloride 107 98 - 111 mmol/L   CO2 23 22 - 32 mmol/L   Glucose, Bld 109 (H) 70 - 99 mg/dL    Comment: Glucose reference range applies only to samples taken after fasting for at least 8 hours.   BUN 5 (L) 6 - 20 mg/dL   Creatinine, Ser 0.43 (L) 0.44 - 1.00 mg/dL   Calcium 8.2 (L) 8.9 - 10.3 mg/dL   Total Protein 7.3 6.5 - 8.1 g/dL   Albumin 2.2 (L) 3.5 - 5.0 g/dL   AST 150 (H) 15 - 41 U/L   ALT 99 (H) 0 - 44 U/L   Alkaline Phosphatase 178 (H) 38 - 126 U/L   Total Bilirubin 0.9 0.3 - 1.2 mg/dL   GFR, Estimated >60 >60 mL/min    Comment: (NOTE) Calculated using the CKD-EPI Creatinine Equation (2021)    Anion gap 7 5 - 15    Comment: Performed at [facility], [place] [address] [place], [place] [address]  CBC with  Differential/Platelet     Status: Abnormal   Collection Time: 06/15/20 10:21 AM  Result Value Ref Range   WBC 4.8 4.0 - 10.5 K/uL   RBC 2.91 (L) 3.87 - 5.11 MIL/uL   Hemoglobin 9.2 (L) 12.0 - 15.0 g/dL   HCT 28.6 (L) 36.0 - 46.0 %   MCV 98.3 80.0 - 100.0 fL   MCH 31.6 26.0 - 34.0 pg   MCHC 32.2 30.0 - 36.0 g/dL   RDW 18.6 (H) 11.5 - 15.5 %   Platelets 200 150 - 400 K/uL   nRBC 0.0 0.0 - 0.2 %   Neutrophils Relative % 78 %   Neutro Abs 3.7 1.7 - 7.7 K/uL   Lymphocytes Relative 17 %   Lymphs Abs 0.8 0.7 - 4.0 K/uL   Monocytes Relative 4 %   Monocytes Absolute 0.2 0.1 - 1.0 K/uL   Eosinophils Relative 0 %   Eosinophils Absolute 0.0  0.0 - 0.5 K/uL   Basophils Relative 0 %   Basophils Absolute 0.0 0.0 - 0.1 K/uL   Immature Granulocytes 1 %   Abs Immature Granulocytes 0.06 0.00 - 0.07 K/uL    Comment: Performed at [facility] [address] [place], [place] [address]  Protime-INR     Status: None   Collection Time: 06/16/20  6:05 AM  Result Value Ref Range   Prothrombin Time 14.4 11.4 - 15.2 seconds   INR 1.2 0.8 - 1.2    Comment: (NOTE) INR goal varies based on device and disease states. Performed at [facility], [place] [address], [place], [place] [address]   APTT     Status: None   Collection Time: 06/16/20  6:05 AM  Result Value Ref Range   aPTT 35 24 - 36 seconds    Comment: Performed at [facility] [address], [place],  [address]  Procalcitonin     Status: None   Collection Time: 06/16/20  6:05 AM  Result Value Ref Range   Procalcitonin 0.13 ng/mL    Comment:        Interpretation: PCT (Procalcitonin) <= 0.5 ng/mL: Systemic infection (sepsis) is not likely. Local bacterial infection is possible. (NOTE)       Sepsis PCT Algorithm           Lower Respiratory Tract                                      Infection PCT Algorithm    ----------------------------     ----------------------------         PCT < 0.25 ng/mL                PCT <  0.10 ng/mL          Strongly encourage             Strongly discourage   discontinuation of antibiotics    initiation of antibiotics    ----------------------------     -----------------------------       PCT 0.25 - 0.50 ng/mL            PCT 0.10 - 0.25 ng/mL               OR       >80% decrease in PCT            Discourage initiation of                                            antibiotics      Encourage discontinuation           of antibiotics    ----------------------------     -----------------------------         PCT >= 0.50 ng/mL              PCT 0.26 - 0.50 ng/mL               AND        <80% decrease in PCT             Encourage initiation of  antibiotics       Encourage continuation           of antibiotics    ----------------------------     -----------------------------        PCT >= 0.50 ng/mL                  PCT > 0.50 ng/mL               AND         increase in PCT                  Strongly encourage                                      initiation of antibiotics    Strongly encourage escalation           of antibiotics                                     -----------------------------                                           PCT <= 0.25 ng/mL                                                 OR                                        > 80% decrease in PCT                                      Discontinue / Do not initiate                                             antibiotics  Performed at [facility], [address], [place], [place] [address]   Comprehensive metabolic panel     Status: Abnormal   Collection Time: 06/16/20  6:05 AM  Result Value Ref Range   Sodium 139 135 - 145 mmol/L   Potassium 3.2 (L) 3.5 - 5.1 mmol/L   Chloride 104 98 - 111 mmol/L   CO2 27 22 - 32 mmol/L   Glucose, Bld 81 70 - 99 mg/dL    Comment: Glucose reference range applies only to samples taken after fasting for at least 8 hours.    BUN 7 6 - 20 mg/dL   Creatinine, Ser 0.37 (L) 0.44 - 1.00 mg/dL   Calcium 8.3 (L) 8.9 - 10.3 mg/dL   Total Protein 6.5 6.5 - 8.1 g/dL   Albumin 2.0 (L) 3.5 - 5.0 g/dL   AST 137 (H) 15 - 41 U/L   ALT 105 (H) 0 - 44 U/L   Alkaline Phosphatase 165 (H) 38 - 126  U/L   Total Bilirubin 0.7 0.3 - 1.2 mg/dL   GFR, Estimated >60 >60 mL/min    Comment: (NOTE) Calculated using the CKD-EPI Creatinine Equation (2021)    Anion gap 8 5 - 15    Comment: Performed at [facility] [address] [place], [place] [address]  CBC with Differential/Platelet     Status: Abnormal   Collection Time: 06/16/20  6:05 AM  Result Value Ref Range   WBC 4.2 4.0 - 10.5 K/uL   RBC 2.84 (L) 3.87 - 5.11 MIL/uL   Hemoglobin 8.9 (L) 12.0 - 15.0 g/dL   HCT 27.5 (L) 36.0 - 46.0 %   MCV 96.8 80.0 - 100.0 fL   MCH 31.3 26.0 - 34.0 pg   MCHC 32.4 30.0 - 36.0 g/dL   RDW 18.3 (H) 11.5 - 15.5 %   Platelets 217 150 - 400 K/uL   nRBC 0.0 0.0 - 0.2 %   Neutrophils Relative % 62 %   Neutro Abs 2.6 1.7 - 7.7 K/uL   Lymphocytes Relative 31 %   Lymphs Abs 1.3 0.7 - 4.0 K/uL   Monocytes Relative 5 %   Monocytes Absolute 0.2 0.1 - 1.0 K/uL   Eosinophils Relative 0 %   Eosinophils Absolute 0.0 0.0 - 0.5 K/uL   Basophils Relative 0 %   Basophils Absolute 0.0 0.0 - 0.1 K/uL   Immature Granulocytes 2 %   Abs Immature Granulocytes 0.10 (H) 0.00 - 0.07 K/uL    Comment: Performed at [facility] [address] [place], [place] [address]   No results found.     Medical Problem List and Plan: 1.  *** secondary to ***  -patient may *** shower  -ELOS/Goals: *** 2.  Antithrombotics: -DVT/anticoagulation:  Mechanical: Sequential compression devices, below knee Bilateral lower extremities  -antiplatelet therapy: N/A 3. Pain Management:  Tramadol prn. Denies abdominal pain today.  4. Mood: LCSW to follow for evaluation and support.   -antipsychotic agents: N/A 5. Neuropsych: This patient is capable of making  decisions on her own behalf. 6. Skin/Wound Care: Routine pressure relief measures.  7. Fluids/Electrolytes/Nutrition: Monitor I/O. Check lytes in am 8. TBI with SAH/SDH:  9. Anemia: Monitor H/H with serial checks.   --monitor for recurrent tarry stools.   ----H/H has varied form 7.5-->10.5-->8.9 in the past two days.  10. Severe fatty liver w/Abnormal LFTs and coagulopathy: Continue vitamin K thorough 06/23/20.   --to transfuse prn Hgb<8 or symptomatic   --Liver biopsy 03/31 to rule of AIH (ANA+ and IgG elevated)  --continues to have RLQ pain with activity/palpation.  11. Seronegative RA/Lupus?: On prednisone 40 mg day--> to continue to follow-up with rheumatology? 12.  Thrombocytopenia: Has resolved.   --Monitor and transfuse prn Plt<50 13. Adjustement disorder/depression?: On Remeron  At bedtime.  14.  Sick euthyroid: Continue Synthroid supplement. 15. Hypokalemia: Likely due to steroids and poor intake.  --Supplement started. Recheck in am.     ***  Bary Leriche, PA-C 06/16/2020

## 2020-06-13 NOTE — Progress Notes (Signed)
Inpatient Rehab Admissions Coordinator:   Consult received and discussed with rehab MDs this morning.  Met with patient at bedside and she is agreeable to CIR.  States she lives at home with her parents and her mom is home 24/7 and can provide any level of assist needed.  Pt does not present with any cognitive deficits from my perspective, though is flat.  Will open insurance for prior authorization for possible admit pending their approval.   Shann Medal, PT, DPT Admissions Coordinator [phone] 06/13/20  10:45 AM

## 2020-06-14 ENCOUNTER — Inpatient Hospital Stay (HOSPITAL_COMMUNITY): Payer: 59

## 2020-06-14 DIAGNOSIS — I609 Nontraumatic subarachnoid hemorrhage, unspecified: Secondary | ICD-10-CM | POA: Diagnosis not present

## 2020-06-14 LAB — LACTIC ACID, PLASMA
Lactic Acid, Venous: 2.7 mmol/L (ref 0.5–1.9)
Lactic Acid, Venous: 4.6 mmol/L (ref 0.5–1.9)

## 2020-06-14 LAB — PROTIME-INR
INR: 1.1 (ref 0.8–1.2)
Prothrombin Time: 13.7 seconds (ref 11.4–15.2)

## 2020-06-14 LAB — COMPREHENSIVE METABOLIC PANEL
ALT: 80 U/L — ABNORMAL HIGH (ref 0–44)
AST: 108 U/L — ABNORMAL HIGH (ref 15–41)
Albumin: 1.9 g/dL — ABNORMAL LOW (ref 3.5–5.0)
Alkaline Phosphatase: 142 U/L — ABNORMAL HIGH (ref 38–126)
Anion gap: 6 (ref 5–15)
BUN: 10 mg/dL (ref 6–20)
CO2: 24 mmol/L (ref 22–32)
Calcium: 7.8 mg/dL — ABNORMAL LOW (ref 8.9–10.3)
Chloride: 103 mmol/L (ref 98–111)
Creatinine, Ser: 0.38 mg/dL — ABNORMAL LOW (ref 0.44–1.00)
GFR, Estimated: >60 mL/min (ref >60)
Glucose, Bld: 84 mg/dL (ref 70–99)
Potassium: 3.3 mmol/L — ABNORMAL LOW (ref 3.5–5.1)
Sodium: 133 mmol/L — ABNORMAL LOW (ref 135–145)
Total Bilirubin: 0.8 mg/dL (ref 0.3–1.2)
Total Protein: 6 g/dL — ABNORMAL LOW (ref 6.5–8.1)

## 2020-06-14 LAB — OCCULT BLOOD X 1 CARD TO LAB, STOOL: Fecal Occult Bld: NEGATIVE

## 2020-06-14 LAB — CBC WITH DIFFERENTIAL/PLATELET
Abs Immature Granulocytes: 0.08 10*3/uL — ABNORMAL HIGH (ref 0.00–0.07)
Basophils Absolute: 0 10*3/uL (ref 0.0–0.1)
Basophils Relative: 0 %
Eosinophils Absolute: 0 10*3/uL (ref 0.0–0.5)
Eosinophils Relative: 0 %
HCT: 25.1 % — ABNORMAL LOW (ref 36.0–46.0)
Hemoglobin: 7.5 g/dL — ABNORMAL LOW (ref 12.0–15.0)
Immature Granulocytes: 2 %
Lymphocytes Relative: 20 %
Lymphs Abs: 0.9 10*3/uL (ref 0.7–4.0)
MCH: 29.2 pg (ref 26.0–34.0)
MCHC: 29.9 g/dL — ABNORMAL LOW (ref 30.0–36.0)
MCV: 97.7 fL (ref 80.0–100.0)
Monocytes Absolute: 0.2 10*3/uL (ref 0.1–1.0)
Monocytes Relative: 4 %
Neutro Abs: 3.4 10*3/uL (ref 1.7–7.7)
Neutrophils Relative %: 74 %
Platelets: 163 10*3/uL (ref 150–400)
RBC: 2.57 MIL/uL — ABNORMAL LOW (ref 3.87–5.11)
RDW: 18.4 % — ABNORMAL HIGH (ref 11.5–15.5)
WBC: 4.6 10*3/uL (ref 4.0–10.5)
nRBC: 0 % (ref 0.0–0.2)

## 2020-06-14 LAB — C DIFFICILE (CDIFF) QUICK SCRN (NO PCR REFLEX)
C Diff antigen: NEGATIVE
C Diff interpretation: NOT DETECTED
C Diff toxin: NEGATIVE

## 2020-06-14 LAB — URINALYSIS, ROUTINE W REFLEX MICROSCOPIC
Bilirubin Urine: NEGATIVE
Glucose, UA: NEGATIVE mg/dL
Hgb urine dipstick: NEGATIVE
Ketones, ur: NEGATIVE mg/dL
Leukocytes,Ua: NEGATIVE
Nitrite: NEGATIVE
Protein, ur: NEGATIVE mg/dL
Specific Gravity, Urine: 1.011 (ref 1.005–1.030)
pH: 8 (ref 5.0–8.0)

## 2020-06-14 LAB — SARS CORONAVIRUS 2 (TAT 6-24 HRS): SARS Coronavirus 2: NEGATIVE

## 2020-06-14 LAB — APTT: aPTT: 36 seconds (ref 24–36)

## 2020-06-14 LAB — PROCALCITONIN: Procalcitonin: 0.16 ng/mL

## 2020-06-14 MED ORDER — LACTATED RINGERS IV BOLUS
1000.0000 mL | Freq: Once | INTRAVENOUS | Status: AC
  Administered 2020-06-14: 1000 mL via INTRAVENOUS

## 2020-06-14 MED ORDER — LACTATED RINGERS IV BOLUS
500.0000 mL | Freq: Once | INTRAVENOUS | Status: AC
  Administered 2020-06-14: 500 mL via INTRAVENOUS

## 2020-06-14 MED ORDER — PIPERACILLIN-TAZOBACTAM 3.375 G IVPB
3.3750 g | Freq: Three times a day (TID) | INTRAVENOUS | Status: DC
  Administered 2020-06-14 – 2020-06-15 (×3): 3.375 g via INTRAVENOUS
  Filled 2020-06-14 (×3): qty 50

## 2020-06-14 MED ORDER — ACETAMINOPHEN 325 MG PO TABS
325.0000 mg | ORAL_TABLET | Freq: Once | ORAL | Status: AC
  Administered 2020-06-14: 325 mg via ORAL
  Filled 2020-06-14: qty 1

## 2020-06-14 MED ORDER — SODIUM CHLORIDE 0.9 % IV SOLN: INTRAVENOUS | Status: DC

## 2020-06-14 NOTE — Progress Notes (Signed)
PROGRESS NOTE   Jessica Frazier  HUD:[id number] DOB: [birth year] DOA: 06/02/2020 PCP: Pcp, No  Brief Narrative:   20 year old female Hypothyroidism followed by Dr. Amedeo Kinsman Bell's palsy 09/02/2018 Rheumatoid arthritis follows previously on Rinvoq with transaminitis secondary to this 04/09/2020 Readmitted 06/02/2020 platelets 61 hemoglobin 9.6 and black-colored stool CTA chest CT abdomen pelvis showed severe fatty liver possible steatohepatitis  In the emergency room mechanical fall with multifocal acute subarachnoid blood and large parietal hematoma INR was 2.6 patient given FFP-FFP stopped Repeat CT head = multifocal new intracranial hemorrhage Neurosurgery consulted Hematology consulted, GI consulted, psychiatry consulted  Hospital-Problem based course  Traumatic subarachnoid subdural hemorrhages in setting of coagulopathy thrombocytopenia Neurosurgery signing off-patient neuro status stable 3/31 minimize CNS meds ? SIRS response Low-grade fever multiple bouts of diarrhea 4/2 sore throat Covid still pending and not ruled out C. difficile negative (patient has not taken any laxatives since admission although they were ordered) fecal occult blood negative-GI pathogen panel is pending at this time I will start Zosyn for broad-spectrum coverage given lactic acidosis, diarrheal illness and narrow if no pathogen is elucidated Coagulopathy uncertain cause Pancytopenia now resolving?  Autoimmune disease-BM biopsy no blasts on final smear  Cytogenetics -normal blood karyotyping Vitamin K has been recommended by hematology as well as steroids ending 06/16/2020 or taper appreciate Dr. Irene Limbo OP f/u if PRN Fatty liver ?  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 Liver bx done 3/31 Psychiatry signed off and do not feel she has an eating disorder LFTs downward trending Continue steroids 40 mg prednisone Seronegative rheumatoid arthritis--more  likely LUPUS followed by Marella Chimes PA with [facility] Rheum  ANA positive LDH 341 factor VIII 180 factor V assay 152 factor X 104 anticardiolipin positive IgM of 115 IgG 10 Outpatient further rheumatological follow-up D/w Marella Chimes, PA at [facility] rheumatology =Pednisone 40 daily till OP f/u 06/16/20@ 1300 with Ms Pearline Cables Acute kidney injury Monitor trends Depression Continue Prozac started this admission Sick euthyroid Continue Synthroid   DVT prophylaxis: SCD Code Status: Full Family Communication: Long discussion with mother at the bedside 4/1 and 4/2 Disposition:  Status is: Inpatient  Remains inpatient appropriate because:Hemodynamically unstable, Persistent severe electrolyte disturbances and Unsafe d/c plan   Dispo: The patient is from: Home              Anticipated d/c is to: Unclear at this time              Patient currently is not medically stable to d/c.   Difficult to place patient No       Consultants:   Multiple as above  Procedures:   Significant Events: 06/03/2000 admitted to the ICU for subarachnoid, subdural hemorrhage.     Significant studies: 06/03/2020 CT scan of the abdomen and pelvis showed fatty liver with hepatic steatosis small ascites. 05/14/2020 CT of the head new multifocal intracranial hemorrhage with expanding left scalp hematoma and new left-sided subdural hematoma, mild subarachnoid hemorrhage, with small midline shift. 06/04/2020 bone marrow biopsy 06/12/20 random liver biopsy performed  Antimicrobials: None currently   Subjective:  3 bouts of diarrhea today per nursing No chest pain No blood in stool Low-grade fever and events overnight noted from night coverage   Objective: Vitals:   06/14/20 0500 06/14/20 0600 06/14/20 1302 06/14/20 1555  BP: 94/82 117/69 109/71 118/85  Pulse: (!) 106 96 70 (!) 108  Resp: _0 Temp: (!) 101 F (38.3 C) 98.9 F (37.2  C) (!) 97.3 F (36.3 C) (!) 97.5 F (36.4 C)  TempSrc: Oral Oral  Oral Oral  SpO2: 100% 100% 100% 100%  Weight: 46.4 kg     Height:        Intake/Output Summary (Last 24 hours) at 06/14/2020 1704 Last data filed at 06/14/2020 1338 Gross per 24 hour  Intake --  Output 1000 ml  Net -1000 ml   Filed Weights   06/11/20 0335 06/13/20 0500 06/14/20 0500  Weight: 53.9 kg 47.5 kg 46.4 kg    Examination:  eomi ncat no focal deficit, anicteric looks fair but is quite weak cannot even sit up on her own Does not however seem to be in distress S1-S2 no murmur no rub no gallop CTA B Mild hepatomegaly with slight tenderness in right upper quadrant No lower extremity edema Euthymic congruent no distress   Data Reviewed: personally reviewed   CBC    Component Value Date/Time   WBC 4.6 06/14/2020 0607   RBC 2.57 (L) 06/14/2020 0607   HGB 7.5 (L) 06/14/2020 0607   HCT 25.1 (L) 06/14/2020 0607   PLT 163 06/14/2020 0607   MCV 97.7 06/14/2020 0607   MCH 29.2 06/14/2020 0607   MCHC 29.9 (L) 06/14/2020 0607   RDW 18.4 (H) 06/14/2020 0607   LYMPHSABS 0.9 06/14/2020 0607   MONOABS 0.2 06/14/2020 0607   EOSABS 0.0 06/14/2020 0607   BASOSABS 0.0 06/14/2020 0607   CMP Latest Ref Rng & Units 06/14/2020 06/12/2020 06/11/2020  Glucose 70 - 99 mg/dL 84 80 85  BUN 6 - 20 mg/dL 10 8 5(L)  Creatinine 0.44 - 1.00 mg/dL 0.38(L) 0.37(L) 0.46  Sodium 135 - 145 mmol/L 133(L) 137 137  Potassium 3.5 - 5.1 mmol/L 3.3(L) 3.8 4.0  Chloride 98 - 111 mmol/L 103 103 103  CO2 22 - 32 mmol/L _0 Calcium 8.9 - 10.3 mg/dL 7.8(L) 8.3(L) 8.2(L)  Total Protein 6.5 - 8.1 g/dL 6.0(L) 6.7 6.6  Total Bilirubin 0.3 - 1.2 mg/dL 0.8 0.8 0.9  Alkaline Phos 38 - 126 U/L 142(H) 175(H) 161(H)  AST 15 - 41 U/L 108(H) 167(H) 147(H)  ALT 0 - 44 U/L 80(H) 117(H) 97(H)     Radiology Studies: DG CHEST PORT 1 VIEW  Result Date: 06/14/2020 CLINICAL DATA:  Fever, cough EXAM: PORTABLE CHEST 1 VIEW COMPARISON:  None. FINDINGS: Normal heart size. Normal mediastinal contour. No pneumothorax. No  pleural effusion. Lungs appear clear, with no acute consolidative airspace disease and no pulmonary edema. Visualized osseous structures appear intact. IMPRESSION: No active disease. Electronically Signed   By: Ilona Sorrel M.D.   On: 06/14/2020 05:35     Scheduled Meds: . B-complex with vitamin C  1 tablet Oral Daily  . Chlorhexidine Gluconate Cloth  6 each Topical Daily  . cholecalciferol  1,000 Units Oral Daily  . feeding supplement  237 mL Oral TID BM  . hydroxypropyl methylcellulose / hypromellose  1 drop Both Eyes TID  . levothyroxine  75 mcg Oral QAC breakfast  . mirtazapine  30 mg Oral QHS  . multivitamin with minerals  1 tablet Oral Daily  . phytonadione  5 mg Oral Daily  . predniSONE  40 mg Oral Q breakfast  . thiamine  100 mg Oral Daily   Continuous Infusions: . sodium chloride 10 mL/hr at 06/06/20 1600  . sodium chloride 75 mL/hr at 06/14/20 1515     LOS: 11 days   Time spent: [place], MD [facility]  To contact the attending provider between 7A-7P or the covering provider during after hours 7P-7A, please log into the web site [email or web address] and access using universal [place] password for that web site. If you do not have the password, please call the hospital operator.  06/14/2020, 5:04 PM

## 2020-06-14 NOTE — Progress Notes (Addendum)
TRH night shift.  The nursing staff reported that the patient had a temperature of 101.1 F with a heart rate of 115 bpm. Her respiratory rate was 19, BP 125/73 mmHg and O2 sat 99% on room air.  She endorses a frontal headache, new nonproductive cough and sore throat.  I was unable to visualize her throat.  Her lungs sound clear.  Heart S1-S2 tachycardic at 116 bpm with no murmurs or pericardial rub.  She continues to have RUQ tenderness.  Extremities with no edema, clubbing or cyanosis.  Musculoskeletal system showing moderate generalized weakness.  Assessment: Fever History of recent IC bleed.  However, the patient has a new cough and mild sore throat.  I will hydrate and obtain urinalysis, CBC, CMP, COVID-19 PCR, blood culture x2 and portable chest radiograph.  I will defer antibiotics at this time pending above work-up.  Tennis Must, MD.

## 2020-06-14 NOTE — Progress Notes (Signed)
Pt had a spike in temp, On call MD made aware and new orders given, will continue to monitor the pt and update MD of any changes, MD came to bedside to assess  the pt.

## 2020-06-15 DIAGNOSIS — I609 Nontraumatic subarachnoid hemorrhage, unspecified: Secondary | ICD-10-CM | POA: Diagnosis not present

## 2020-06-15 LAB — CBC WITH DIFFERENTIAL/PLATELET
Abs Immature Granulocytes: 0.07 10*3/uL (ref 0.00–0.07)
Abs Immature Granulocytes: 0.06 10*3/uL (ref 0.00–0.07)
Basophils Absolute: 0 10*3/uL (ref 0.0–0.1)
Basophils Absolute: 0 10*3/uL (ref 0.0–0.1)
Basophils Relative: 0 %
Basophils Relative: 0 %
Eosinophils Absolute: 0 10*3/uL (ref 0.0–0.5)
Eosinophils Absolute: 0 10*3/uL (ref 0.0–0.5)
Eosinophils Relative: 0 %
Eosinophils Relative: 0 %
HCT: 33.1 % — ABNORMAL LOW (ref 36.0–46.0)
HCT: 28.6 % — ABNORMAL LOW (ref 36.0–46.0)
Hemoglobin: 10.5 g/dL — ABNORMAL LOW (ref 12.0–15.0)
Hemoglobin: 9.2 g/dL — ABNORMAL LOW (ref 12.0–15.0)
Immature Granulocytes: 1 %
Immature Granulocytes: 1 %
Lymphocytes Relative: 30 %
Lymphocytes Relative: 17 %
Lymphs Abs: 1.5 10*3/uL (ref 0.7–4.0)
Lymphs Abs: 0.8 10*3/uL (ref 0.7–4.0)
MCH: 30.8 pg (ref 26.0–34.0)
MCH: 31.6 pg (ref 26.0–34.0)
MCHC: 31.7 g/dL (ref 30.0–36.0)
MCHC: 32.2 g/dL (ref 30.0–36.0)
MCV: 97.1 fL (ref 80.0–100.0)
MCV: 98.3 fL (ref 80.0–100.0)
Monocytes Absolute: 0.2 10*3/uL (ref 0.1–1.0)
Monocytes Absolute: 0.2 10*3/uL (ref 0.1–1.0)
Monocytes Relative: 4 %
Monocytes Relative: 4 %
Neutro Abs: 3.2 10*3/uL (ref 1.7–7.7)
Neutro Abs: 3.7 10*3/uL (ref 1.7–7.7)
Neutrophils Relative %: 65 %
Neutrophils Relative %: 78 %
Platelets: 230 10*3/uL (ref 150–400)
Platelets: 200 10*3/uL (ref 150–400)
RBC: 3.41 MIL/uL — ABNORMAL LOW (ref 3.87–5.11)
RBC: 2.91 MIL/uL — ABNORMAL LOW (ref 3.87–5.11)
RDW: 18.5 % — ABNORMAL HIGH (ref 11.5–15.5)
RDW: 18.6 % — ABNORMAL HIGH (ref 11.5–15.5)
WBC: 5 10*3/uL (ref 4.0–10.5)
WBC: 4.8 10*3/uL (ref 4.0–10.5)
nRBC: 0 % (ref 0.0–0.2)
nRBC: 0 % (ref 0.0–0.2)

## 2020-06-15 LAB — COMPREHENSIVE METABOLIC PANEL
ALT: 99 U/L — ABNORMAL HIGH (ref 0–44)
AST: 150 U/L — ABNORMAL HIGH (ref 15–41)
Albumin: 2.2 g/dL — ABNORMAL LOW (ref 3.5–5.0)
Alkaline Phosphatase: 178 U/L — ABNORMAL HIGH (ref 38–126)
Anion gap: 7 (ref 5–15)
BUN: 5 mg/dL — ABNORMAL LOW (ref 6–20)
CO2: 23 mmol/L (ref 22–32)
Calcium: 8.2 mg/dL — ABNORMAL LOW (ref 8.9–10.3)
Chloride: 107 mmol/L (ref 98–111)
Creatinine, Ser: 0.43 mg/dL — ABNORMAL LOW (ref 0.44–1.00)
GFR, Estimated: >60 mL/min (ref >60)
Glucose, Bld: 109 mg/dL — ABNORMAL HIGH (ref 70–99)
Potassium: 3.6 mmol/L (ref 3.5–5.1)
Sodium: 137 mmol/L (ref 135–145)
Total Bilirubin: 0.9 mg/dL (ref 0.3–1.2)
Total Protein: 7.3 g/dL (ref 6.5–8.1)

## 2020-06-15 LAB — PROTIME-INR
INR: 1.1 (ref 0.8–1.2)
Prothrombin Time: 14.2 seconds (ref 11.4–15.2)

## 2020-06-15 LAB — PROCALCITONIN: Procalcitonin: 0.19 ng/mL

## 2020-06-15 LAB — APTT: aPTT: 38 seconds — ABNORMAL HIGH (ref 24–36)

## 2020-06-15 NOTE — Progress Notes (Signed)
PROGRESS NOTE   Jessica Frazier  IBB:[id number] DOB: 2000/06/14 DOA: 06/02/2020 PCP: Pcp, No  Brief Narrative:   20 year old female Hypothyroidism followed by Dr. Amedeo Kinsman Bell's palsy 09/02/2018 Rheumatoid arthritis follows previously on Rinvoq with transaminitis secondary to this 04/09/2020 Readmitted 06/02/2020 platelets 61 hemoglobin 9.6 and black-colored stool CTA chest CT abdomen pelvis showed severe fatty liver possible steatohepatitis  In the emergency room mechanical fall with multifocal acute subarachnoid blood and large parietal hematoma INR was 2.6 patient given FFP-FFP stopped Repeat CT head = multifocal new intracranial hemorrhage Neurosurgery consulted Hematology consulted, GI consulted, psychiatry consulted  Hospital-Problem based course  Traumatic subarachnoid subdural hemorrhages in setting of coagulopathy thrombocytopenia Neurosurgery signing off-patient neuro status stable 3/31 minimize CNS meds ? SIRS response-DDx autoimmune flare Low-grade fever multiple bouts of diarrhea 4/2 sore throat Covid negative C. difficile negative blood culture X 2 - GI pathogen panel still pending Stop Zosyn/IVF and observe fever curve/response Coagulopathy uncertain cause Pancytopenia now resolving?  Autoimmune disease-BM biopsy no blasts on final smear  Cytogenetics -normal blood karyotyping Vitamin K has been recommended by hematology as well as steroids ending 06/16/2020 or taper appreciate Dr. Irene Limbo OP f/u if PRN Fatty liver ?  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-repeat LFT a.m. + INR Liver bx done 3/31-result is pending Psychiatry signed off and do not feel she has an eating disorder Continue steroids 40 mg prednisone Seronegative rheumatoid arthritis--more likely LUPUS followed by Marella Chimes PA with [facility] Rheum  ANA positive LDH 341 factor VIII 180 factor V assay 152 factor X 104 anticardiolipin positive IgM of 115 IgG  10 Outpatient further rheumatological follow-up D/w Marella Chimes, PA at [facility] rheumatology =Pednisone 40 daily till OP f/u 06/16/20@ 1300 with Ms Pearline Cables Acute kidney injury Monitor trends Depression Continue Prozac started this admission Sick euthyroid Continue Synthroid   DVT prophylaxis: SCD Code Status: Full Family Communication: Long discussion with mother at the bedside 4/1 and 4/2 Disposition:  Status is: Inpatient  Remains inpatient appropriate because:Hemodynamically unstable, Persistent severe electrolyte disturbances and Unsafe d/c plan   Dispo: The patient is from: Home              Anticipated d/c is to: Unclear at this time              Patient currently is medically stable to d/c.  If no fever or SIRS physiology can likely transfer to inpatient rehab in a.m.   Difficult to place patient No  \  Consultants:   Multiple as above  Procedures:   Significant Events: 06/03/2000 admitted to the ICU for subarachnoid, subdural hemorrhage.     Significant studies: 06/03/2020 CT scan of the abdomen and pelvis showed fatty liver with hepatic steatosis small ascites. 05/14/2020 CT of the head new multifocal intracranial hemorrhage with expanding left scalp hematoma and new left-sided subdural hematoma, mild subarachnoid hemorrhage, with small midline shift. 06/04/2020 bone marrow biopsy 06/12/20 random liver biopsy performed 4/2-fever observed worked up without source suspect autoimmune flare  Antimicrobials: None currently   Subjective:  No further diarrhea Tells me he was drinking milk which usually causes an immune response on No chest pain no chills Smiling and happy   Objective: Vitals:   06/15/20 0500 06/15/20 0724 06/15/20 1115 06/15/20 1532  BP:  130/89 114/79 113/76  Pulse:  73 86 89  Resp:  18 18 (!) 22  Temp:  98.2 F (36.8 C) 98.5 F (36.9 C) 98.1 F (36.7 C)  TempSrc:  Oral Oral Oral  SpO2:  99% 100% 100%  Weight: 48 kg     Height:         Intake/Output Summary (Last 24 hours) at 06/15/2020 1636 Last data filed at 06/15/2020 1407 Gross per 24 hour  Intake 1881.46 ml  Output 1575 ml  Net 306.46 ml   Filed Weights   06/13/20 0500 06/14/20 0500 06/15/20 0500  Weight: 47.5 kg 46.4 kg 48 kg    Examination:  Coherent no distress smiling Had shower S1-S2 no murmur Abdomen soft no rebound Neurologically intact moving all 4 limbs ROM intact  Data Reviewed: personally reviewed   CBC    Component Value Date/Time   WBC 4.8 06/15/2020 1021   RBC 2.91 (L) 06/15/2020 1021   HGB 9.2 (L) 06/15/2020 1021   HCT 28.6 (L) 06/15/2020 1021   PLT 200 06/15/2020 1021   MCV 98.3 06/15/2020 1021   MCH 31.6 06/15/2020 1021   MCHC 32.2 06/15/2020 1021   RDW 18.6 (H) 06/15/2020 1021   LYMPHSABS 0.8 06/15/2020 1021   MONOABS 0.2 06/15/2020 1021   EOSABS 0.0 06/15/2020 1021   BASOSABS 0.0 06/15/2020 1021   CMP Latest Ref Rng & Units 06/15/2020 06/14/2020 06/12/2020  Glucose 70 - 99 mg/dL 109(H) 84 80  BUN 6 - 20 mg/dL 5(L) 10 8  Creatinine 0.44 - 1.00 mg/dL 0.43(L) 0.38(L) 0.37(L)  Sodium 135 - 145 mmol/L 137 133(L) 137  Potassium 3.5 - 5.1 mmol/L 3.6 3.3(L) 3.8  Chloride 98 - 111 mmol/L 107 103 103  CO2 22 - 32 mmol/L 23 24 29   Calcium 8.9 - 10.3 mg/dL 8.2(L) 7.8(L) 8.3(L)  Total Protein 6.5 - 8.1 g/dL 7.3 6.0(L) 6.7  Total Bilirubin 0.3 - 1.2 mg/dL 0.9 0.8 0.8  Alkaline Phos 38 - 126 U/L 178(H) 142(H) 175(H)  AST 15 - 41 U/L 150(H) 108(H) 167(H)  ALT 0 - 44 U/L 99(H) 80(H) 117(H)     Radiology Studies: DG CHEST PORT 1 VIEW  Result Date: 06/14/2020 CLINICAL DATA:  Fever, cough EXAM: PORTABLE CHEST 1 VIEW COMPARISON:  None. FINDINGS: Normal heart size. Normal mediastinal contour. No pneumothorax. No pleural effusion. Lungs appear clear, with no acute consolidative airspace disease and no pulmonary edema. Visualized osseous structures appear intact. IMPRESSION: No active disease. Electronically Signed   By: Ilona Sorrel M.D.    On: 06/14/2020 05:35     Scheduled Meds: . B-complex with vitamin C  1 tablet Oral Daily  . Chlorhexidine Gluconate Cloth  6 each Topical Daily  . cholecalciferol  1,000 Units Oral Daily  . feeding supplement  237 mL Oral TID BM  . hydroxypropyl methylcellulose / hypromellose  1 drop Both Eyes TID  . levothyroxine  75 mcg Oral QAC breakfast  . mirtazapine  30 mg Oral QHS  . multivitamin with minerals  1 tablet Oral Daily  . phytonadione  5 mg Oral Daily  . predniSONE  40 mg Oral Q breakfast  . thiamine  100 mg Oral Daily   Continuous Infusions: . sodium chloride 10 mL/hr at 06/06/20 1600     LOS: 12 days   Time spent: 25  Nita Sells, MD [facility] To contact the attending provider between 7A-7P or the covering provider during after hours 7P-7A, please log into the web site [email or web address] and access using universal [place] password for that web site. If you do not have the password, please call the hospital operator.  06/15/2020, 4:36 PM

## 2020-06-16 ENCOUNTER — Inpatient Hospital Stay (HOSPITAL_COMMUNITY)
Admission: RE | Admit: 2020-06-16 | Discharge: 2020-06-25 | DRG: 092 | Disposition: A | Discharge status: Home / Self Care | Payer: 59 | Source: Intra-hospital | Attending: Physical Medicine & Rehabilitation | Admitting: Physical Medicine & Rehabilitation

## 2020-06-16 ENCOUNTER — Other Ambulatory Visit: Payer: Self-pay

## 2020-06-16 ENCOUNTER — Encounter (HOSPITAL_COMMUNITY): Payer: Self-pay | Admitting: Physical Medicine & Rehabilitation

## 2020-06-16 DIAGNOSIS — F4320 Adjustment disorder, unspecified: Secondary | ICD-10-CM | POA: Diagnosis present

## 2020-06-16 DIAGNOSIS — R7989 Other specified abnormal findings of blood chemistry: Secondary | ICD-10-CM | POA: Diagnosis present

## 2020-06-16 DIAGNOSIS — M069 Rheumatoid arthritis, unspecified: Secondary | ICD-10-CM | POA: Diagnosis present

## 2020-06-16 DIAGNOSIS — K7581 Nonalcoholic steatohepatitis (NASH): Secondary | ICD-10-CM | POA: Diagnosis present

## 2020-06-16 DIAGNOSIS — T380X5A Adverse effect of glucocorticoids and synthetic analogues, initial encounter: Secondary | ICD-10-CM | POA: Diagnosis present

## 2020-06-16 DIAGNOSIS — Z833 Family history of diabetes mellitus: Secondary | ICD-10-CM | POA: Diagnosis not present

## 2020-06-16 DIAGNOSIS — S069X0S Unspecified intracranial injury without loss of consciousness, sequela: Secondary | ICD-10-CM | POA: Diagnosis not present

## 2020-06-16 DIAGNOSIS — E871 Hypo-osmolality and hyponatremia: Secondary | ICD-10-CM | POA: Diagnosis present

## 2020-06-16 DIAGNOSIS — E0781 Sick-euthyroid syndrome: Secondary | ICD-10-CM | POA: Diagnosis present

## 2020-06-16 DIAGNOSIS — R000 Tachycardia, unspecified: Secondary | ICD-10-CM | POA: Diagnosis not present

## 2020-06-16 DIAGNOSIS — Z79899 Other long term (current) drug therapy: Secondary | ICD-10-CM

## 2020-06-16 DIAGNOSIS — D689 Coagulation defect, unspecified: Secondary | ICD-10-CM

## 2020-06-16 DIAGNOSIS — W19XXXS Unspecified fall, sequela: Secondary | ICD-10-CM | POA: Diagnosis present

## 2020-06-16 DIAGNOSIS — S069X1S Unspecified intracranial injury with loss of consciousness of 30 minutes or less, sequela: Secondary | ICD-10-CM | POA: Diagnosis not present

## 2020-06-16 DIAGNOSIS — Y92239 Unspecified place in hospital as the place of occurrence of the external cause: Secondary | ICD-10-CM | POA: Diagnosis present

## 2020-06-16 DIAGNOSIS — D62 Acute posthemorrhagic anemia: Secondary | ICD-10-CM | POA: Diagnosis present

## 2020-06-16 DIAGNOSIS — K760 Fatty (change of) liver, not elsewhere classified: Secondary | ICD-10-CM | POA: Diagnosis not present

## 2020-06-16 DIAGNOSIS — S069X0A Unspecified intracranial injury without loss of consciousness, initial encounter: Secondary | ICD-10-CM

## 2020-06-16 DIAGNOSIS — L89152 Pressure ulcer of sacral region, stage 2: Secondary | ICD-10-CM | POA: Diagnosis present

## 2020-06-16 DIAGNOSIS — E039 Hypothyroidism, unspecified: Secondary | ICD-10-CM | POA: Diagnosis present

## 2020-06-16 DIAGNOSIS — D61818 Other pancytopenia: Secondary | ICD-10-CM | POA: Diagnosis present

## 2020-06-16 DIAGNOSIS — E876 Hypokalemia: Secondary | ICD-10-CM | POA: Diagnosis present

## 2020-06-16 DIAGNOSIS — K759 Inflammatory liver disease, unspecified: Secondary | ICD-10-CM | POA: Diagnosis present

## 2020-06-16 DIAGNOSIS — M0609 Rheumatoid arthritis without rheumatoid factor, multiple sites: Secondary | ICD-10-CM | POA: Diagnosis not present

## 2020-06-16 DIAGNOSIS — D649 Anemia, unspecified: Secondary | ICD-10-CM

## 2020-06-16 DIAGNOSIS — S066X9S Traumatic subarachnoid hemorrhage with loss of consciousness of unspecified duration, sequela: Principal | ICD-10-CM

## 2020-06-16 DIAGNOSIS — S069XAA Unspecified intracranial injury with loss of consciousness status unknown, initial encounter: Secondary | ICD-10-CM | POA: Diagnosis present

## 2020-06-16 DIAGNOSIS — S069X9A Unspecified intracranial injury with loss of consciousness of unspecified duration, initial encounter: Secondary | ICD-10-CM | POA: Diagnosis present

## 2020-06-16 LAB — GASTROINTESTINAL PANEL BY PCR, STOOL (REPLACES STOOL CULTURE)

## 2020-06-16 LAB — COMPREHENSIVE METABOLIC PANEL
ALT: 105 U/L — ABNORMAL HIGH (ref 0–44)
AST: 137 U/L — ABNORMAL HIGH (ref 15–41)
Albumin: 2 g/dL — ABNORMAL LOW (ref 3.5–5.0)
Alkaline Phosphatase: 165 U/L — ABNORMAL HIGH (ref 38–126)
Anion gap: 8 (ref 5–15)
BUN: 7 mg/dL (ref 6–20)
CO2: 27 mmol/L (ref 22–32)
Calcium: 8.3 mg/dL — ABNORMAL LOW (ref 8.9–10.3)
Chloride: 104 mmol/L (ref 98–111)
Creatinine, Ser: 0.37 mg/dL — ABNORMAL LOW (ref 0.44–1.00)
GFR, Estimated: >60 mL/min (ref >60)
Glucose, Bld: 81 mg/dL (ref 70–99)
Potassium: 3.2 mmol/L — ABNORMAL LOW (ref 3.5–5.1)
Sodium: 139 mmol/L (ref 135–145)
Total Bilirubin: 0.7 mg/dL (ref 0.3–1.2)
Total Protein: 6.5 g/dL (ref 6.5–8.1)

## 2020-06-16 LAB — BASIC METABOLIC PANEL
Anion gap: 7 (ref 5–15)
BUN: 6 mg/dL (ref 6–20)
CO2: 27 mmol/L (ref 22–32)
Calcium: 8.8 mg/dL — ABNORMAL LOW (ref 8.9–10.3)
Chloride: 101 mmol/L (ref 98–111)
Creatinine, Ser: 0.47 mg/dL (ref 0.44–1.00)
GFR, Estimated: >60 mL/min (ref >60)
Glucose, Bld: 100 mg/dL — ABNORMAL HIGH (ref 70–99)
Potassium: 4.7 mmol/L (ref 3.5–5.1)
Sodium: 135 mmol/L (ref 135–145)

## 2020-06-16 LAB — CBC WITH DIFFERENTIAL/PLATELET
Abs Immature Granulocytes: 0.1 10*3/uL — ABNORMAL HIGH (ref 0.00–0.07)
Basophils Absolute: 0 10*3/uL (ref 0.0–0.1)
Basophils Relative: 0 %
Eosinophils Absolute: 0 10*3/uL (ref 0.0–0.5)
Eosinophils Relative: 0 %
HCT: 27.5 % — ABNORMAL LOW (ref 36.0–46.0)
Hemoglobin: 8.9 g/dL — ABNORMAL LOW (ref 12.0–15.0)
Immature Granulocytes: 2 %
Lymphocytes Relative: 31 %
Lymphs Abs: 1.3 10*3/uL (ref 0.7–4.0)
MCH: 31.3 pg (ref 26.0–34.0)
MCHC: 32.4 g/dL (ref 30.0–36.0)
MCV: 96.8 fL (ref 80.0–100.0)
Monocytes Absolute: 0.2 10*3/uL (ref 0.1–1.0)
Monocytes Relative: 5 %
Neutro Abs: 2.6 10*3/uL (ref 1.7–7.7)
Neutrophils Relative %: 62 %
Platelets: 217 10*3/uL (ref 150–400)
RBC: 2.84 MIL/uL — ABNORMAL LOW (ref 3.87–5.11)
RDW: 18.3 % — ABNORMAL HIGH (ref 11.5–15.5)
WBC: 4.2 10*3/uL (ref 4.0–10.5)
nRBC: 0 % (ref 0.0–0.2)

## 2020-06-16 LAB — PROCALCITONIN: Procalcitonin: 0.13 ng/mL

## 2020-06-16 LAB — PROTIME-INR
INR: 1.2 (ref 0.8–1.2)
Prothrombin Time: 14.4 seconds (ref 11.4–15.2)

## 2020-06-16 LAB — APTT: aPTT: 35 seconds (ref 24–36)

## 2020-06-16 LAB — SURGICAL PATHOLOGY

## 2020-06-16 MED ORDER — FLEET ENEMA 7-19 GM/118ML RE ENEM: 1.0000 | ENEMA | Freq: Once | RECTAL | Status: DC | PRN

## 2020-06-16 MED ORDER — PROCHLORPERAZINE MALEATE 5 MG PO TABS: 5.0000 mg | ORAL_TABLET | Freq: Four times a day (QID) | ORAL | Status: DC | PRN

## 2020-06-16 MED ORDER — THIAMINE HCL 100 MG PO TABS
100.0000 mg | ORAL_TABLET | Freq: Every day | ORAL | Status: DC
  Administered 2020-06-17 – 2020-06-25 (×9): 100 mg via ORAL
  Filled 2020-06-16 (×9): qty 1

## 2020-06-16 MED ORDER — VITAMIN D 25 MCG (1000 UNIT) PO TABS
1000.0000 [IU] | ORAL_TABLET | Freq: Every day | ORAL | Status: DC
  Administered 2020-06-17 – 2020-06-25 (×9): 1000 [IU] via ORAL
  Filled 2020-06-16 (×9): qty 1

## 2020-06-16 MED ORDER — LEVOTHYROXINE SODIUM 75 MCG PO TABS
75.0000 ug | ORAL_TABLET | Freq: Every day | ORAL | Status: DC
  Administered 2020-06-17 – 2020-06-25 (×9): 75 ug via ORAL
  Filled 2020-06-16 (×9): qty 1

## 2020-06-16 MED ORDER — ALUM & MAG HYDROXIDE-SIMETH 200-200-20 MG/5ML PO SUSP: 30.0000 mL | ORAL | Status: DC | PRN

## 2020-06-16 MED ORDER — PREDNISONE 20 MG PO TABS
40.0000 mg | ORAL_TABLET | Freq: Every day | ORAL | Status: DC
  Administered 2020-06-17 – 2020-06-24 (×8): 40 mg via ORAL
  Filled 2020-06-16 (×8): qty 2

## 2020-06-16 MED ORDER — TRAZODONE HCL 50 MG PO TABS: 25.0000 mg | ORAL_TABLET | Freq: Every evening | ORAL | Status: DC | PRN

## 2020-06-16 MED ORDER — ACETAMINOPHEN 325 MG PO TABS
325.0000 mg | ORAL_TABLET | ORAL | Status: DC | PRN
Start: 2020-06-16 — End: 2020-06-25
  Administered 2020-06-18 – 2020-06-24 (×6): 650 mg via ORAL
  Filled 2020-06-16 (×6): qty 2

## 2020-06-16 MED ORDER — PHENOL 1.4 % MT LIQD
1.0000 | OROMUCOSAL | Status: DC | PRN
  Filled 2020-06-16: qty 177

## 2020-06-16 MED ORDER — HYPROMELLOSE (GONIOSCOPIC) 2.5 % OP SOLN
1.0000 [drp] | Freq: Three times a day (TID) | OPHTHALMIC | Status: DC
  Administered 2020-06-16 – 2020-06-25 (×27): 1 [drp] via OPHTHALMIC
  Filled 2020-06-16: qty 15

## 2020-06-16 MED ORDER — ADULT MULTIVITAMIN W/MINERALS CH
1.0000 | ORAL_TABLET | Freq: Every day | ORAL | Status: DC
  Administered 2020-06-17 – 2020-06-25 (×9): 1 via ORAL
  Filled 2020-06-16 (×9): qty 1

## 2020-06-16 MED ORDER — GUAIFENESIN-DM 100-10 MG/5ML PO SYRP: 5.0000 mL | ORAL_SOLUTION | Freq: Four times a day (QID) | ORAL | Status: DC | PRN

## 2020-06-16 MED ORDER — DIPHENHYDRAMINE HCL 12.5 MG/5ML PO ELIX: 12.5000 mg | ORAL_SOLUTION | Freq: Four times a day (QID) | ORAL | Status: DC | PRN

## 2020-06-16 MED ORDER — MIRTAZAPINE 15 MG PO TABS
30.0000 mg | ORAL_TABLET | Freq: Every day | ORAL | Status: DC
  Administered 2020-06-16 – 2020-06-24 (×9): 30 mg via ORAL
  Filled 2020-06-16 (×9): qty 2

## 2020-06-16 MED ORDER — POLYETHYLENE GLYCOL 3350 17 G PO PACK: 17.0000 g | PACK | Freq: Every day | ORAL | Status: DC | PRN

## 2020-06-16 MED ORDER — BISACODYL 10 MG RE SUPP: 10.0000 mg | Freq: Every day | RECTAL | Status: DC | PRN

## 2020-06-16 MED ORDER — B COMPLEX-C PO TABS
1.0000 | ORAL_TABLET | Freq: Every day | ORAL | Status: DC
  Administered 2020-06-17 – 2020-06-25 (×9): 1 via ORAL
  Filled 2020-06-16 (×9): qty 1

## 2020-06-16 MED ORDER — POTASSIUM CHLORIDE CRYS ER 20 MEQ PO TBCR
40.0000 meq | EXTENDED_RELEASE_TABLET | Freq: Every day | ORAL | Status: DC
  Administered 2020-06-16: 40 meq via ORAL
  Filled 2020-06-16: qty 2

## 2020-06-16 MED ORDER — CHLORHEXIDINE GLUCONATE CLOTH 2 % EX PADS: 6.0000 | MEDICATED_PAD | Freq: Every day | CUTANEOUS | Status: DC

## 2020-06-16 MED ORDER — PHYTONADIONE 5 MG PO TABS
5.0000 mg | ORAL_TABLET | Freq: Every day | ORAL | Status: AC
  Administered 2020-06-17 – 2020-06-23 (×7): 5 mg via ORAL
  Filled 2020-06-16 (×7): qty 1

## 2020-06-16 MED ORDER — PROCHLORPERAZINE EDISYLATE 10 MG/2ML IJ SOLN: 5.0000 mg | Freq: Four times a day (QID) | INTRAMUSCULAR | Status: DC | PRN

## 2020-06-16 MED ORDER — PROCHLORPERAZINE 25 MG RE SUPP: 12.5000 mg | Freq: Four times a day (QID) | RECTAL | Status: DC | PRN

## 2020-06-16 MED ORDER — POTASSIUM CHLORIDE CRYS ER 20 MEQ PO TBCR
20.0000 meq | EXTENDED_RELEASE_TABLET | Freq: Two times a day (BID) | ORAL | Status: DC
  Administered 2020-06-17 – 2020-06-25 (×17): 20 meq via ORAL
  Filled 2020-06-16 (×17): qty 1

## 2020-06-16 MED ORDER — ENSURE ENLIVE PO LIQD
237.0000 mL | Freq: Three times a day (TID) | ORAL | Status: DC
  Administered 2020-06-16 – 2020-06-25 (×23): 237 mL via ORAL

## 2020-06-16 NOTE — Progress Notes (Signed)
Occupational Therapy Treatment Patient Details Name: Jessica Frazier MRN: [id number] DOB: 2001/01/04 Today's Date: 06/16/2020    History of present illness 20 y/o female presented to ED on 3/21 with chief complaint of abdominal pain and black stools. In ED, patient fell in bathroom and hit head. Head CT found multifocal acute subarachnoid hemmorhage and L parietal hematoma without skull fx. Repeat head CT showed L hypodense subdural hematoma with new midline shift. Evaluating for coagulopathy. PMH: hypothyroidism, RA   OT comments  Pt seen in conjunction with PT to progress functional mobility and maximize pts activity tolerance. Pt continues to present with decreased activity tolerance, generalized weakness and impaired balance. Pt currently requires MIN A +2 for functional mobility, min guard assist for standing grooming tasks, MIN A for UB ADLs and up to total A for LB ADLs. Pt requires increased time to follow one step commands needing step by step cues for sequencing tasks. Pt continues to be an excellent CIR candidate, will continue to follow acutely per POC.   Follow Up Recommendations  Supervision/Assistance - 24 hour;CIR    Equipment Recommendations  Other (comment) (TBD at next venue of care)    Recommendations for Other Services      Precautions / Restrictions Precautions Precautions: Fall Restrictions Weight Bearing Restrictions: No       Mobility Bed Mobility Overal bed mobility: Needs Assistance Bed Mobility: Supine to Sit     Supine to sit: Min assist     General bed mobility comments: minA for trunk elevation, scooting to EOB with use of bed pads.    Transfers Overall transfer level: Needs assistance Equipment used: 2 person hand held assist Transfers: Sit to/from Stand Sit to Stand: Min assist;+2 physical assistance         General transfer comment: min +2 for power up, rise, steadying especially from low surface. STS x5, from various surfaces  during session, struggles more with lower surfaces like toilet.    Balance Overall balance assessment: Needs assistance Sitting-balance support: Bilateral upper extremity supported Sitting balance-Leahy Scale: Poor Sitting balance - Comments: min guard for static sitting EOB   Standing balance support: Single extremity supported;During functional activity Standing balance-Leahy Scale: Poor Standing balance comment: at least one UE supported during ADLs at sink, x3 LOB during functional mobility                           ADL either performed or assessed with clinical judgement   ADL Overall ADL's : Needs assistance/impaired     Grooming: Wash/dry hands;Brushing hair;Standing;Min guard;Minimal assistance Grooming Details (indicate cue type and reason): standing at sink with min guard assist- MIN A for balance and safety     Lower Body Bathing: Total assistance;Sit to/from stand Lower Body Bathing Details (indicate cue type and reason): simulated via posterior pericare Upper Body Dressing : Minimal assistance;Sitting Upper Body Dressing Details (indicate cue type and reason): to don posterior gown     Toilet Transfer: Minimal assistance;+2 for physical assistance;+2 for safety/equipment;Ambulation;Regular Toilet;Grab bars Toilet Transfer Details (indicate cue type and reason): cues to reach back and for safety awareness when ascending<>descending onto sitting surface Toileting- Clothing Manipulation and Hygiene: Sit to/from stand;Total assistance Toileting - Clothing Manipulation Details (indicate cue type and reason): pt declined attempting to complete posterior pericare d/t fatigue, total A needed     Functional mobility during ADLs: Minimal assistance;+2 for physical assistance;+2 for safety/equipment;Cueing for safety General ADL Comments: pt continues to present  with impaired balance, decreased activity tolerance and generalized weakness. pt able to complete functional  mobiltiy, toileting tasks and grooming tasks at sink     Vision       Perception     Praxis      Cognition Arousal/Alertness: Awake/alert Behavior During Therapy: Flat affect Overall Cognitive Status: Impaired/Different from baseline Area of Impairment: Following commands;Problem solving;Safety/judgement;Awareness                       Following Commands: Follows one step commands with increased time Safety/Judgement: Decreased awareness of safety;Decreased awareness of deficits Awareness: Emergent Problem Solving: Slow processing;Difficulty sequencing;Requires verbal cues;Decreased initiation General Comments: pt very falt during session, requries increased time to follow commands and needs step by step cues to sequence ADLs and mobility tasks        Exercises     Shoulder Instructions       General Comments HR 130s with mobility tasks    Pertinent Vitals/ Pain       Pain Assessment: Faces Faces Pain Scale: Hurts little more Pain Location: wrists/hands, feet due to RA Pain Descriptors / Indicators: Grimacing;Discomfort;Aching Pain Intervention(s): Limited activity within patient's tolerance;Monitored during session;Repositioned  Home Living                                          Prior Functioning/Environment              Frequency  Min 2X/week        Progress Toward Goals  OT Goals(current goals can now be found in the care plan section)  Progress towards OT goals: Progressing toward goals  Acute Rehab OT Goals Patient Stated Goal: to get better OT Goal Formulation: With patient Time For Goal Achievement: 06/25/20 (OTR saw on 3/30) Potential to Achieve Goals: [place] Discharge plan remains appropriate;Frequency remains appropriate    Co-evaluation      Reason for Co-Treatment: For patient/therapist safety;To address functional/ADL transfers PT goals addressed during session: Mobility/safety with  mobility;Balance;Strengthening/ROM OT goals addressed during session: ADL's and self-care      AM-PAC OT "6 Clicks" Daily Activity     Outcome Measure   Help from another person eating meals?: A Little Help from another person taking care of personal grooming?: A Little Help from another person toileting, which includes using toliet, bedpan, or urinal?: A Lot Help from another person bathing (including washing, rinsing, drying)?: A Lot Help from another person to put on and taking off regular upper body clothing?: A Lot Help from another person to put on and taking off regular lower body clothing?: A Lot 6 Click Score: 14    End of Session Equipment Utilized During Treatment: Gait belt  OT Visit Diagnosis: Unsteadiness on feet (R26.81);Muscle weakness (generalized) (M62.81);Pain;Other symptoms and signs involving cognitive function;Hemiplegia and hemiparesis Hemiplegia - Right/Left: Right Hemiplegia - dominant/non-dominant: Dominant Hemiplegia - caused by: Unspecified Pain - part of body:  (both hands and feet from RA)   Activity Tolerance Patient tolerated treatment well   Patient Left in chair;with call bell/phone within reach;with chair alarm set   Nurse Communication Mobility status        Time: 7628-3151 OT Time Calculation (min): 33 min  Charges: OT General Charges $OT Visit: 1 Visit OT Treatments $Self Care/Home Management : 8-22 mins  Harley Alto., COTA/L Acute Rehabilitation Services [phone]  Corinne Ports [facility] 06/16/2020, 1:23 PM

## 2020-06-16 NOTE — Progress Notes (Addendum)
PMR Admission Coordinator Pre-Admission Assessment   Patient: Jessica Frazier is an 20 y.o., female MRN: [id number] DOB: 2000-04-21 Height: 5' 2"  (157.5 cm) Weight: 48.2 kg   Insurance Information HMO:     PPO:      PCP:      IPA:      80/20:      OTHER:  PRIMARY: [place]      Policy#: [id number]      Subscriber: pt CM Name: n/a      Phone#: n/a     Fax#: [phone] Pre-Cert#: [id number] auth for CIR provided by fax with updates due to fax listed above on 4/7.      Employer:  Benefits:  Phone #: [phone]     Name:  Eff. Date: 05/14/2019     Deduct: $0      Out of Pocket Max: $1600 ($0 met)      Life Max: n/a CIR: 90%      SNF: 90% Outpatient:      Co-Ins: 10% Home Health: 90%      Co-Ins: 10% DME: 90%     Co-Ins: 10% Providers:  SECONDARY:       Policy#:      Phone#:    Development worker, community:       Phone#:    The Therapist, art Information Summary" for patients in Inpatient Rehabilitation Facilities with attached "Privacy Act [place] Records" was provided and verbally reviewed with: N/A   Emergency Contact Information         Contact Information     Name Relation Home Work Mobile    Frazier,Jessica Mother     [phone]         Current Medical History  Patient Admitting Diagnosis: Cerulean    History of Present Illness: Jessica Frazier is a 20 y.o. female with history of RA, Bell's palsy, hypothyroidism, abnormal LFTs felt to be due to [place] who was admitted on 06/02/20 with poor po intake, weakness, muscle wasting, BLE edema and epigastric pain. She was found to have worsening of pancytopenia with black stools, and while in ED she sustained a fall with multifocal acute SAH predominantly over parietal lobes.    She was also found to have severe fatty liver with steatohepatitis,  coagulopathy with INR-2.6 and FFP ordered but d/c due to fever.  Dr. Arnoldo Morale consulted and recommended monitoring with serial CT head as patient neurologically  stable. DR. Irene Limbo recommended vitamin K with platelets to keep plt>50,000. Bone marrow biopsy done for work up and path pending.    She did have progression of ICH with increase in size, edema and mass effect as well as enlargement of large left parietal scalp hematoma. She was not felt to be a surgical candidate and medical management recommended as neurologically stable. Dr. Demaris Callander consulted for input due to concerns of depression and anorexia but felt that weight loss due to diarrhea and vomiting rather than a psychiatric disorder. Dr. Benson Norway following for input and recommended AIH work up as LFTs remained persistently elevated and plans for liver  biopsy. She continues to have impairments due to weakness with balance deficits as well as cognitive deficits with delay in processing, memory and ability to follow simple commands. CIR recommended due to functional decline.      Patient's medical record from [facility] has been reviewed by the rehabilitation admission coordinator and physician.   Past Medical History      Past Medical History:  Diagnosis Date  .  Bell's palsy    . Hypothyroidism    . Rheumatoid arthritis ([place])        Family History   family history includes Diabetes in her maternal grandfather; Berenice Primas' disease in her mother; Healthy in her father.   Prior Rehab/Hospitalizations Has the patient had prior rehab or hospitalizations prior to admission? No   Has the patient had major surgery during 100 days prior to admission? No               Current Medications   Current Facility-Administered Medications:  .  0.9 %  sodium chloride infusion, , Intravenous, PRN, Bowser, Laurel Dimmer, NP, Last Rate: 10 mL/hr at 06/06/20 1600, Infusion Verify at 06/06/20 1600 .  acetaminophen (TYLENOL) tablet 650 mg, 650 mg, Oral, Q6H PRN, Candee Furbish, MD, 650 mg at 06/14/20 0420 .  B-complex with vitamin C tablet 1 tablet, 1 tablet, Oral, Daily, Brunetta Genera, MD, 1 tablet at 06/16/20 0905 .   Chlorhexidine Gluconate Cloth 2 % PADS 6 each, 6 each, Topical, Daily, Candee Furbish, MD, 6 each at 06/16/20 [phone] .  cholecalciferol (VITAMIN D3) tablet 1,000 Units, 1,000 Units, Oral, Daily, Charlynne Cousins, MD, 1,000 Units at 06/16/20 0905 .  feeding supplement (ENSURE ENLIVE / ENSURE PLUS) liquid 237 mL, 237 mL, Oral, TID BM, Charlynne Cousins, MD, 237 mL at 06/16/20 0905 .  heparin lock flush 100 unit/mL, 500 Units, Intracatheter, Daily PRN, Brunetta Genera, MD .  heparin lock flush 100 unit/mL, 250 Units, Intracatheter, PRN, Irene Limbo, Cloria Spring, MD .  hydroxypropyl methylcellulose / hypromellose (ISOPTO TEARS / GONIOVISC) 2.5 % ophthalmic solution 1 drop, 1 drop, Both Eyes, TID, Nita Sells, MD, 1 drop at 06/16/20 0909 .  levothyroxine (SYNTHROID) tablet 75 mcg, 75 mcg, Oral, QAC breakfast, Candee Furbish, MD, 75 mcg at 06/16/20 0610 .  mirtazapine (REMERON) tablet 30 mg, 30 mg, Oral, QHS, Charlynne Cousins, MD, 30 mg at 06/15/20 2147 .  multivitamin with minerals tablet 1 tablet, 1 tablet, Oral, Daily, Charlynne Cousins, MD, 1 tablet at 06/16/20 0906 .  ondansetron (ZOFRAN) injection 4 mg, 4 mg, Intravenous, Q6H PRN, Ogan, [place]wo U, MD .  phenol (CHLORASEPTIC) mouth spray 1 spray, 1 spray, Mouth/Throat, PRN, Candee Furbish, MD, 1 spray at 06/09/20 0809 .  [COMPLETED] phytonadione (VITAMIN K) tablet 10 mg, 10 mg, Oral, Daily, 10 mg at 06/09/20 1046 **FOLLOWED BY** phytonadione (VITAMIN K) tablet 5 mg, 5 mg, Oral, Daily, Bowser, Grace E, NP, 5 mg at 06/16/20 0906 .  potassium chloride SA (KLOR-CON) CR tablet 40 mEq, 40 mEq, Oral, Daily, Verlon Au, Jai-Gurmukh, MD, 40 mEq at 06/16/20 0905 .  predniSONE (DELTASONE) tablet 40 mg, 40 mg, Oral, Q breakfast, Jennelle Human B, NP, 40 mg at 06/16/20 0906 .  sodium chloride flush (NS) 0.9 % injection 10 mL, 10 mL, Intracatheter, PRN, Brunetta Genera, MD .  thiamine tablet 100 mg, 100 mg, Oral, Daily, Charlynne Cousins, MD, 100 mg at 06/16/20 6948   Patients Current Diet:     Diet Order                      Diet regular Room service appropriate? Yes; Fluid consistency: Thin  Diet effective now                      Precautions / Restrictions Precautions Precautions: Fall Precaution Comments: cortrak Restrictions Weight Bearing Restrictions: No    Has  the patient had 2 or more falls or a fall with injury in the past year? Yes   Prior Activity Level Community (5-7x/wk): independent prior to admit with no DME, has been on disability since RA diagnosis, driving   Prior Functional Level Self Care: Did the patient need help bathing, dressing, using the toilet or eating? Independent   Indoor Mobility: Did the patient need assistance with walking from room to room (with or without device)? Independent   Stairs: Did the patient need assistance with internal or external stairs (with or without device)? Independent   Functional Cognition: Did the patient need help planning regular tasks such as shopping or remembering to take medications? Independent   Home Assistive Devices / Equipment   Prior Device Use: Indicate devices/aids used by the patient prior to current illness, exacerbation or injury? None of the above   Current Functional Level Cognition   Overall Cognitive Status: Impaired/Different from baseline Orientation Level: Oriented X4 Following Commands: Follows one step commands with increased time Safety/Judgement: Decreased awareness of safety,Decreased awareness of deficits General Comments: flat affect, increased time to complete mobility tasks when cued. Pt appears to struggle with sequencing tasks, requires step-by-step instruction for basic tasks (ie scooting back in recliner, STS from chair with armrests). Pt can be self-limiting, stating "I can't" before attempting tasks.    Extremity Assessment (includes Sensation/Coordination)   Upper Extremity Assessment: Generalized  weakness,RUE deficits/detail,LUE deficits/detail RUE Deficits / Details: 2-/5 MM grade; poor grip; very little AROM noted RUE: Unable to fully assess due to pain RUE Sensation: decreased light touch RUE Coordination: decreased fine motor,decreased gross motor LUE Deficits / Details: 2/5 MM grade; poor grip; very little AROM noted LUE: Unable to fully assess due to pain LUE Sensation: decreased light touch LUE Coordination: decreased fine motor,decreased gross motor  Lower Extremity Assessment: Generalized weakness RLE Deficits / Details: decreased AROM, very weak RLE Sensation: decreased light touch,decreased proprioception RLE Coordination: decreased fine motor,decreased gross motor LLE Deficits / Details: decreased AROM, very weak LLE Sensation: decreased light touch,decreased proprioception LLE Coordination: decreased fine motor,decreased gross motor     ADLs   Overall ADL's : Needs assistance/impaired Eating/Feeding: Total assistance Grooming: Set up,Sitting,Oral care Grooming Details (indicate cue type and reason): seated in recliner to brush teeth with supervision Upper Body Bathing: Total assistance,Bed level Lower Body Bathing: Total assistance,Bed level Upper Body Dressing : Moderate assistance,Sitting Lower Body Dressing: Total assistance,Bed level Toilet Transfer: Minimal assistance,+2 for physical assistance,+2 for safety/equipment,Ambulation Toilet Transfer Details (indicate cue type and reason): simulated to recliner Toileting- Clothing Manipulation and Hygiene: Total assistance Toileting - Clothing Manipulation Details (indicate cue type and reason): peeing on side of bed, aware, but did not make sure that purewick was in place Functional mobility during ADLs: Minimal assistance,+2 for physical assistance,+2 for safety/equipment,Cueing for safety General ADL Comments: Pt severely limited by decreased strength, decreased activity tolerance, decreased ability to care for  self and decreased cognition. Pt following 75% of commands with increased time; cues to open eyes and leave open upon arousal. Pt unable to grip a utensil to feed self.     Mobility   Overal bed mobility: Needs Assistance Bed Mobility: Supine to Sit Rolling: Total assist,+2 for physical assistance Supine to sit: Min assist Sit to supine: Total assist,+2 for physical assistance,+2 for safety/equipment General bed mobility comments: minA for trunk elevation, scooting to EOB with use of bed pads.     Transfers   Overall transfer level: Needs assistance Equipment used: 2 person  hand held assist Transfers: Sit to/from Stand Sit to Stand: Min assist,+2 physical assistance General transfer comment: min +2 for power up, rise, steadying especially from low surface. STS x5, from various surfaces during session, struggles more with lower surfaces like toilet.     Ambulation / Gait / Stairs / Wheelchair Mobility   Ambulation/Gait Ambulation/Gait assistance: Min assist,+2 physical assistance,+2 safety/equipment Gait Distance (Feet): 80 Feet (+10+10) Assistive device: 1 person hand held assist Gait Pattern/deviations: Decreased stride length,Ataxic,Drifts right/left,Narrow base of support,Decreased step length - right,Decreased step length - left,Step-through pattern General Gait Details: min assist to steady, LOB x3 requiring physical assist via gait belt to correct with changing directions both 90 and 180 degree turns. Pt with mild weaving of gait and alternating width BOS, cuing for increasing step length and upright posture. Gait velocity: decr     Posture / Balance Dynamic Sitting Balance Sitting balance - Comments: min guard for static sitting EOB Balance Overall balance assessment: Needs assistance Sitting-balance support: Bilateral upper extremity supported Sitting balance-Leahy Scale: Fair Sitting balance - Comments: min guard for static sitting EOB Postural control: Other (comment)  (varying directions) Standing balance support: Single extremity supported,During functional activity Standing balance-Leahy Scale: Poor Standing balance comment: short distance gait without UE support, intermittent HHA required to steady self.     Special needs/care consideration n/a    Previous Home Environment (from acute therapy documentation) Living Arrangements: Parent,Other relatives (siblings) Type of Home: House Additional Comments: little information given by patient   Discharge Living Setting Plans for Discharge Living Setting: Patient's home,Lives with (comment) (parents) Type of Home at Discharge: House Discharge Home Layout: One level,Laundry or work area in basement Discharge Home Access: Stairs to enter Entrance Stairs-Rails: None Technical brewer of Steps: 1 Discharge Bathroom Shower/Tub: Tub/shower unit Discharge Bathroom Toilet: Standard Discharge Bathroom Accessibility: Yes How Accessible: Accessible via walker Does the patient have any problems obtaining your medications?: No   Social/Family/Support Systems Anticipated Caregiver: mom, Jessica Anticipated Ambulance person Information: [phone] (needs spanish interpreter) Ability/Limitations of Caregiver: n/a Caregiver Availability: 24/7 Discharge Plan Discussed with Primary Caregiver: Yes Is Caregiver In Agreement with Plan?: Yes Does Caregiver/Family have Issues with Lodging/Transportation while Pt is in Rehab?: No   Goals Patient/Family Goal for Rehab: PT/OT supervision to mod I, SLP n/a Expected length of stay: 9-12 days Cultural Considerations: mom will need interpreter, but pt understands English Pt/Family Agrees to Admission and willing to participate: Yes Program Orientation Provided & Reviewed with Pt/Caregiver Including Roles  & Responsibilities: Yes  Barriers to Discharge: Insurance for SNF coverage   Decrease burden of Care through IP rehab admission:  N/a    Possible need for  SNF placement upon discharge:  Not anticipated.  Plan for home with mother to provide care as needed.    Patient Condition: I have reviewed medical records from [facility], spoken with CM, and patient. I met with patient at the bedside for inpatient rehabilitation assessment.  Patient will benefit from ongoing PT and OT, can actively participate in 3 hours of therapy a day 5 days of the week, and can make measurable gains during the admission.  Patient will also benefit from the coordinated team approach during an Inpatient Acute Rehabilitation admission.  The patient will receive intensive therapy as well as Rehabilitation physician, nursing, social worker, and care management interventions.  Due to safety, disease management, medication administration, pain management and patient education the patient requires 24 hour a day rehabilitation nursing.  The patient is currently min  assist with mobility and basic ADLs.  Discharge setting and therapy post discharge at home with home health is anticipated.  Patient has agreed to participate in the Acute Inpatient Rehabilitation Program and will admit today.   Preadmission Screen Completed By:  Michel Santee, 06/16/2020 12:52 PM ______________________________________________________________________   Discussed status with Dr. Letta Pate on 06/16/20  at 12:52 PM  and received approval for admission today.   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 Does the need for close, 24 hr/day Medical supervision in concert with the patient's rehab needs make it unreasonable for this patient to be served in a less intensive setting? Yes 2. Co-Morbidities requiring supervision/potential complications: Rheumatoid arthritis, hepatitis 3. Due to bladder management, bowel management, safety, skin/wound care, disease management, medication  administration, pain management and patient education, does the patient require 24 hr/day rehab nursing? Yes 4. Does the patient require coordinated care of a physician, rehab nurse, PT, OT, and SLP to address physical and functional deficits in the context of the above medical diagnosis(es)? Yes Addressing deficits in the following areas: balance, endurance, locomotion, strength, transferring, bowel/bladder control, bathing, dressing, feeding, cognition and psychosocial support 5. Can the patient actively participate in an intensive therapy program of at least 3 hrs of therapy 5 days a week? Yes 6. The potential for patient to make measurable gains while on inpatient rehab is good 7. Anticipated functional outcomes upon discharge from inpatient rehab: modified independent PT, modified independent OT, modified independent SLP 8. Estimated rehab length of stay to reach the above functional goals is: 9 to 12 days 9. Anticipated discharge destination: Home 10. Overall Rehab/Functional Prognosis: good     MD Signature: Charlett Blake M.D. [facility] Fellow Am Acad of [employer] Med and Rehab Diplomate Am Board of Electrodiagnostic Med Fellow Am Board of Interventional Pain

## 2020-06-16 NOTE — Progress Notes (Signed)
Inpatient Rehabilitation  Patient information reviewed and entered into eRehab system by Rebekah Zackery M. Jesusita Jocelyn, M.A., CCC/SLP, PPS Coordinator.  Information including medical coding, functional ability and quality indicators will be reviewed and updated through discharge.    

## 2020-06-16 NOTE — Progress Notes (Signed)
Patient arrived on unit, oriented to unit. Reviewed medications, therapy schedule, rehab routine and plan of care. States an understanding of information reviewed. No complications noted at this time. Patient reports no pain and is AX4 Jessica Frazier  

## 2020-06-16 NOTE — H&P (Signed)
Physical Medicine and Rehabilitation Admission H&P     CC: TBI with functional deficits     HPI: Jessica Frazier is a 20 year old female with history of seronegative RA, Bell's palsy, hypothyroid, abnormal LFTs felt to be due Rinvoq who was admitted on 06/02/20 with poor po intake, muscle wasting,  epigastric pain and BLE edema. She was found to have black stools with pancytopenia.  She sustained a fall in ED and was found to have multifocal acute SAH predominantly over parietal lobes.  She was also found to have severe fatty liver with steatohepatitis, coagulopathy with INR 2.6 and FFP was ordered but discontinued due to fever.  Dr. Arnoldo Morale was consulted for input and recommended monitoring with serial CT of head as patient was neurologically stable. Dr. Irene Limbo recommended continuing vitamin K for 2 weeks, to monitor PT/INR/Fibrinogen, transfuse platelets if less than 50, INR 1.5 or fibrinogen <200 as well as taper steroids over 2 weeks.     Bone biopsy was done for work-up and revealed pancytopenia with normocellular bone marrow with trilineage hematopoiesis including megakaryocytes--questioned to be secondary to RA/liver disease.  She did have progression of ICH with increase in size, edema and mass-effect with enlargement of large left parietal scalp hematoma.  She was not felt to be a surgical candidate and medical management recommended as she was neurologically stable.  Dr. Dagar/psychiatry was consulted for input due to concerns of depression and anorexia but felt that weight loss was due to diarrhea and vomiting rather than psychiatric disorder.  Dr. Allene Pyo  recommended I need to work-up his LFTs remain persistently elevated and she underwent ultrasound-guided biopsy of liver by Dr. Earleen Newport on 03/31.  She developed fever with lactic acidosis on 04/02 --question sepsis and treated with fluids and antibiotics briefly with improvement.  Antibiotics d/c 04/03 and she has been for the past 24  hours. She continues to be limited by abdominal pain with weakness, balance deficits as well as delayed processing.  CIR recommended due to functional decline.     Review of Systems  Constitutional: Negative for chills and fever.  HENT: Negative for hearing loss.   Eyes: Negative for blurred vision and double vision.  Respiratory: Positive for cough (mild ). Negative for shortness of breath.   Cardiovascular: Negative for chest pain and leg swelling.  Gastrointestinal: Negative for abdominal pain (better/almost resolved) and constipation.  Genitourinary: Negative for dysuria and frequency.  Musculoskeletal: Positive for joint pain and myalgias.  Skin: Negative for rash.  Neurological: Positive for weakness and headaches. Negative for dizziness and sensory change.  Psychiatric/Behavioral: Negative for memory loss. The patient is not nervous/anxious.           Past Medical History:  Diagnosis Date  . Bell's palsy    . Hypothyroidism    . Rheumatoid arthritis ([place])        History reviewed. No pertinent surgical history.           Family History  Problem Relation Age of Onset  . Graves' disease Mother    . Healthy Father    . Diabetes Maternal Grandfather        Social History: Lives with parents. Independent --"stays in bed all day" or goes out with mother. Sedentary for past 5 years due to "sickness". She  reports that she has never smoked. She has never used smokeless tobacco. She reports previous alcohol use. She reports that she does not use drugs.  Allergies  Allergen Reactions  . Other Diarrhea and Other (See Comments)      Omega XL  . Rinvoq [Upadacitinib] Other (See Comments)      Enlarged the patient's liver- had to come to the ED, 2022            Medications Prior to Admission  Medication Sig Dispense Refill  . levothyroxine (SYNTHROID) 75 MCG tablet Take 1 tablet (75 mcg total) by mouth daily. 90 tablet 3  . dicyclomine (BENTYL) 20 MG tablet Take 1  tablet (20 mg total) by mouth 2 (two) times daily for 5 days. (Patient not taking: Reported on 06/02/2020) 10 tablet 0      Drug Regimen Review  Drug regimen was reviewed and remains appropriate with no significant issues identified   Home: Home Living Family/patient expects to be discharged to:: Private residence Living Arrangements: Parent,Other relatives (siblings) Type of Home: House Additional Comments: little information given by patient   Functional History: Prior Function Level of Independence: Needs assistance Gait / Transfers Assistance Needed: Patient states she gets her mom's help when she needs to get up and walk ADL's / Homemaking Assistance Needed: Needs help with bathing and dressing due to weakness   Functional Status:  Mobility: Bed Mobility Overal bed mobility: Needs Assistance Bed Mobility: Supine to Sit Rolling: Total assist,+2 for physical assistance Supine to sit: Min assist Sit to supine: Total assist,+2 for physical assistance,+2 for safety/equipment General bed mobility comments: minA for trunk elevation, scooting to EOB with use of bed pads. Transfers Overall transfer level: Needs assistance Equipment used: 2 person hand held assist Transfers: Sit to/from Stand Sit to Stand: Min assist,+2 physical assistance General transfer comment: min +2 for power up, rise, steadying especially from low surface. STS x5, from various surfaces during session, struggles more with lower surfaces like toilet. Ambulation/Gait Ambulation/Gait assistance: Min assist,+2 physical assistance,+2 safety/equipment Gait Distance (Feet): 80 Feet (+10+10) Assistive device: 1 person hand held assist Gait Pattern/deviations: Decreased stride length,Ataxic,Drifts right/left,Narrow base of support,Decreased step length - right,Decreased step length - left,Step-through pattern General Gait Details: min assist to steady, LOB x3 requiring physical assist via gait belt to correct with  changing directions both 90 and 180 degree turns. Pt with mild weaving of gait and alternating width BOS, cuing for increasing step length and upright posture. Gait velocity: decr   ADL: ADL Overall ADL's : Needs assistance/impaired Eating/Feeding: Total assistance Grooming: Wash/dry hands,Brushing hair,Standing,Min guard,Minimal assistance Grooming Details (indicate cue type and reason): standing at sink with min guard assist- MIN A for balance and safety Upper Body Bathing: Total assistance,Bed level Lower Body Bathing: Total assistance,Sit to/from stand Lower Body Bathing Details (indicate cue type and reason): simulated via posterior pericare Upper Body Dressing : Minimal assistance,Sitting Upper Body Dressing Details (indicate cue type and reason): to don posterior gown Lower Body Dressing: Total assistance,Bed level Toilet Transfer: Minimal assistance,+2 for physical assistance,+2 for safety/equipment,Ambulation,Regular Toilet,Grab bars Toilet Transfer Details (indicate cue type and reason): cues to reach back and for safety awareness when ascending<>descending onto sitting surface Toileting- Clothing Manipulation and Hygiene: Sit to/from stand,Total assistance Toileting - Clothing Manipulation Details (indicate cue type and reason): pt declined attempting to complete posterior pericare d/t fatigue, total A needed Functional mobility during ADLs: Minimal assistance,+2 for physical assistance,+2 for safety/equipment,Cueing for safety General ADL Comments: pt continues to present with impaired balance, decreased activity tolerance and generalized weakness. pt able to complete functional mobiltiy, toileting tasks and grooming tasks at sink   Cognition: Cognition  Overall Cognitive Status: Impaired/Different from baseline Orientation Level: Oriented X4 Cognition Arousal/Alertness: Awake/alert Behavior During Therapy: Flat affect Overall Cognitive Status: Impaired/Different from  baseline Area of Impairment: Following commands,Problem solving,Safety/judgement,Awareness Memory: Decreased short-term memory Following Commands: Follows one step commands with increased time Safety/Judgement: Decreased awareness of safety,Decreased awareness of deficits Awareness: Emergent Problem Solving: Slow processing,Difficulty sequencing,Requires verbal cues,Decreased initiation General Comments: pt very falt during session, requries increased time to follow commands and needs step by step cues to sequence ADLs and mobility tasks     Blood pressure 131/90, pulse 99, temperature 99.7 F (37.6 C), temperature source Oral, resp. rate 18, height 5\' 2"  (1.575 m), weight 48.2 kg, SpO2 100 %. Physical Exam Constitutional:      Appearance: She is well-developed.     Comments: Thin female. Brighter and more appropriate today.   Abdominal:     General: There is no distension.     Tenderness: There is no abdominal tenderness.  Neurological:     Mental Status: She is alert and oriented to person, place, and time.     Comments: Speech clear. Able to follow commands without difficulty.        General: No acute distress Mood and affect are appropriate Heart: Regular rate and rhythm no rubs murmurs or extra sounds Lungs: Clear to auscultation, breathing unlabored, no rales or wheezes Abdomen: Positive bowel sounds, soft nontender to palpation, nondistended Extremities: No clubbing, cyanosis, or edema Skin: No evidence of breakdown, no evidence of rash Neurologic: Cranial nerves II through XII intact, motor strength is 4/5 in bilateral deltoid, bicep, tricep, grip, hip flexor, knee extensors, ankle dorsiflexor and plantar flexor Sensory exam normal sensation to light touch and proprioception in bilateral upper and lower extremities Cerebellar exam normal finger to nose to finger as well as heel to shin in bilateral upper and lower extremities Musculoskeletal: Reduced shoulder range of  motion, swan-neck deformities in digits 2 through 5 bilaterally with joint hyperlaxity. No joint swelling  Lab Results Last 48 Hours        Results for orders placed or performed during the hospital encounter of 06/02/20 (from the past 48 hour(s))  Protime-INR     Status: None    Collection Time: 06/15/20  1:23 AM  Result Value Ref Range    Prothrombin Time 14.2 11.4 - 15.2 seconds    INR 1.1 0.8 - 1.2      Comment: (NOTE) INR goal varies based on device and disease states. Performed at [facility], [place] [address], [place], [place] [address]    APTT     Status: Abnormal    Collection Time: 06/15/20  1:23 AM  Result Value Ref Range    aPTT 38 (H) 24 - 36 seconds      Comment:        IF BASELINE aPTT IS ELEVATED, SUGGEST PATIENT RISK ASSESSMENT BE USED TO DETERMINE APPROPRIATE ANTICOAGULANT THERAPY. Performed at [facility], [place] [address], [place], [place] [address]    Procalcitonin     Status: None    Collection Time: 06/15/20  1:23 AM  Result Value Ref Range    Procalcitonin 0.19 ng/mL      Comment:        Interpretation: PCT (Procalcitonin) <= 0.5 ng/mL: Systemic infection (sepsis) is not likely. Local bacterial infection is possible. (NOTE)       Sepsis PCT Algorithm           Lower Respiratory Tract  Infection PCT Algorithm    ----------------------------     ----------------------------         PCT < 0.25 ng/mL                PCT < 0.10 ng/mL           Strongly encourage             Strongly discourage   discontinuation of antibiotics    initiation of antibiotics    ----------------------------     -----------------------------       PCT 0.25 - 0.50 ng/mL            PCT 0.10 - 0.25 ng/mL               OR       >80% decrease in PCT            Discourage initiation of                                            antibiotics      Encourage discontinuation           of antibiotics    ----------------------------      -----------------------------         PCT >= 0.50 ng/mL              PCT 0.26 - 0.50 ng/mL               AND        <80% decrease in PCT             Encourage initiation of                                             antibiotics       Encourage continuation           of antibiotics    ----------------------------     -----------------------------        PCT >= 0.50 ng/mL                  PCT > 0.50 ng/mL               AND         increase in PCT                  Strongly encourage                                      initiation of antibiotics    Strongly encourage escalation           of antibiotics                                     -----------------------------                                           PCT <= 0.25 ng/mL  OR                                        > 80% decrease in PCT                                       Discontinue / Do not initiate                                             antibiotics   Performed at [facility], [place] [address] [place], [place] [address]    CBC with Differential/Platelet     Status: Abnormal    Collection Time: 06/15/20  9:20 AM  Result Value Ref Range    WBC 5.0 4.0 - 10.5 K/uL    RBC 3.41 (L) 3.87 - 5.11 MIL/uL    Hemoglobin 10.5 (L) 12.0 - 15.0 g/dL      Comment: QUESTIONABLE RESULTS, called to Cyndi Lennert RN [place] [phone] by Wendall Stade. Will recollect to confirm.    HCT 33.1 (L) 36.0 - 46.0 %    MCV 97.1 80.0 - 100.0 fL    MCH 30.8 26.0 - 34.0 pg    MCHC 31.7 30.0 - 36.0 g/dL    RDW 18.5 (H) 11.5 - 15.5 %    Platelets 230 150 - 400 K/uL    nRBC 0.0 0.0 - 0.2 %    Neutrophils Relative % 65 %    Neutro Abs 3.2 1.7 - 7.7 K/uL    Lymphocytes Relative 30 %    Lymphs Abs 1.5 0.7 - 4.0 K/uL    Monocytes Relative 4 %    Monocytes Absolute 0.2 0.1 - 1.0 K/uL    Eosinophils Relative 0 %    Eosinophils Absolute 0.0 0.0 - 0.5 K/uL    Basophils Relative 0 %    Basophils Absolute 0.0 0.0 -  0.1 K/uL    Immature Granulocytes 1 %    Abs Immature Granulocytes 0.07 0.00 - 0.07 K/uL      Comment: Performed at [facility] [address], [place], [place] [address]  Comprehensive metabolic panel     Status: Abnormal    Collection Time: 06/15/20  9:20 AM  Result Value Ref Range    Sodium 137 135 - 145 mmol/L    Potassium 3.6 3.5 - 5.1 mmol/L    Chloride 107 98 - 111 mmol/L    CO2 23 22 - 32 mmol/L    Glucose, Bld 109 (H) 70 - 99 mg/dL      Comment: Glucose reference range applies only to samples taken after fasting for at least 8 hours.    BUN 5 (L) 6 - 20 mg/dL    Creatinine, Ser 0.43 (L) 0.44 - 1.00 mg/dL    Calcium 8.2 (L) 8.9 - 10.3 mg/dL    Total Protein 7.3 6.5 - 8.1 g/dL    Albumin 2.2 (L) 3.5 - 5.0 g/dL    AST 150 (H) 15 - 41 U/L    ALT 99 (H) 0 - 44 U/L    Alkaline Phosphatase 178 (H) 38 - 126 U/L    Total Bilirubin 0.9 0.3 - 1.2 mg/dL  GFR, Estimated >60 >60 mL/min      Comment: (NOTE) Calculated using the CKD-EPI Creatinine Equation (2021)      Anion gap 7 5 - 15      Comment: Performed at [facility], [place] [address] [place], [place] [address]  CBC with Differential/Platelet     Status: Abnormal    Collection Time: 06/15/20 10:21 AM  Result Value Ref Range    WBC 4.8 4.0 - 10.5 K/uL    RBC 2.91 (L) 3.87 - 5.11 MIL/uL    Hemoglobin 9.2 (L) 12.0 - 15.0 g/dL    HCT 28.6 (L) 36.0 - 46.0 %    MCV 98.3 80.0 - 100.0 fL    MCH 31.6 26.0 - 34.0 pg    MCHC 32.2 30.0 - 36.0 g/dL    RDW 18.6 (H) 11.5 - 15.5 %    Platelets 200 150 - 400 K/uL    nRBC 0.0 0.0 - 0.2 %    Neutrophils Relative % 78 %    Neutro Abs 3.7 1.7 - 7.7 K/uL    Lymphocytes Relative 17 %    Lymphs Abs 0.8 0.7 - 4.0 K/uL    Monocytes Relative 4 %    Monocytes Absolute 0.2 0.1 - 1.0 K/uL    Eosinophils Relative 0 %    Eosinophils Absolute 0.0 0.0 - 0.5 K/uL    Basophils Relative 0 %    Basophils Absolute 0.0 0.0 - 0.1 K/uL    Immature Granulocytes 1 %    Abs Immature  Granulocytes 0.06 0.00 - 0.07 K/uL      Comment: Performed at [facility] [address], [place], [place] [address]  Protime-INR     Status: None    Collection Time: 06/16/20  6:05 AM  Result Value Ref Range    Prothrombin Time 14.4 11.4 - 15.2 seconds    INR 1.2 0.8 - 1.2      Comment: (NOTE) INR goal varies based on device and disease states. Performed at [facility], [place] [address], [place], [place] [address]    APTT     Status: None    Collection Time: 06/16/20  6:05 AM  Result Value Ref Range    aPTT 35 24 - 36 seconds      Comment: Performed at [facility] [address] [place], [place] [address]  Procalcitonin     Status: None    Collection Time: 06/16/20  6:05 AM  Result Value Ref Range    Procalcitonin 0.13 ng/mL      Comment:        Interpretation: PCT (Procalcitonin) <= 0.5 ng/mL: Systemic infection (sepsis) is not likely. Local bacterial infection is possible. (NOTE)       Sepsis PCT Algorithm           Lower Respiratory Tract                                      Infection PCT Algorithm    ----------------------------     ----------------------------         PCT < 0.25 ng/mL                PCT < 0.10 ng/mL           Strongly encourage             Strongly discourage  discontinuation of antibiotics    initiation of antibiotics    ----------------------------     -----------------------------       PCT 0.25 - 0.50 ng/mL            PCT 0.10 - 0.25 ng/mL               OR       >80% decrease in PCT            Discourage initiation of                                            antibiotics      Encourage discontinuation           of antibiotics    ----------------------------     -----------------------------         PCT >= 0.50 ng/mL              PCT 0.26 - 0.50 ng/mL               AND        <80% decrease in PCT             Encourage initiation of                                             antibiotics       Encourage continuation            of antibiotics    ----------------------------     -----------------------------        PCT >= 0.50 ng/mL                  PCT > 0.50 ng/mL               AND         increase in PCT                  Strongly encourage                                      initiation of antibiotics    Strongly encourage escalation           of antibiotics                                     -----------------------------                                           PCT <= 0.25 ng/mL                                                 OR                                        >  80% decrease in PCT                                       Discontinue / Do not initiate                                             antibiotics   Performed at [facility], [place] [address], [place], [place] [address]    Comprehensive metabolic panel     Status: Abnormal    Collection Time: 06/16/20  6:05 AM  Result Value Ref Range    Sodium 139 135 - 145 mmol/L    Potassium 3.2 (L) 3.5 - 5.1 mmol/L    Chloride 104 98 - 111 mmol/L    CO2 27 22 - 32 mmol/L    Glucose, Bld 81 70 - 99 mg/dL      Comment: Glucose reference range applies only to samples taken after fasting for at least 8 hours.    BUN 7 6 - 20 mg/dL    Creatinine, Ser 0.37 (L) 0.44 - 1.00 mg/dL    Calcium 8.3 (L) 8.9 - 10.3 mg/dL    Total Protein 6.5 6.5 - 8.1 g/dL    Albumin 2.0 (L) 3.5 - 5.0 g/dL    AST 137 (H) 15 - 41 U/L    ALT 105 (H) 0 - 44 U/L    Alkaline Phosphatase 165 (H) 38 - 126 U/L    Total Bilirubin 0.7 0.3 - 1.2 mg/dL    GFR, Estimated >60 >60 mL/min      Comment: (NOTE) Calculated using the CKD-EPI Creatinine Equation (2021)      Anion gap 8 5 - 15      Comment: Performed at [facility], [place] [address] [place], [place] [address]  CBC with Differential/Platelet     Status: Abnormal    Collection Time: 06/16/20  6:05 AM  Result Value Ref Range    WBC 4.2 4.0 - 10.5 K/uL    RBC 2.84 (L) 3.87 - 5.11 MIL/uL    Hemoglobin 8.9 (L) 12.0  - 15.0 g/dL    HCT 27.5 (L) 36.0 - 46.0 %    MCV 96.8 80.0 - 100.0 fL    MCH 31.3 26.0 - 34.0 pg    MCHC 32.4 30.0 - 36.0 g/dL    RDW 18.3 (H) 11.5 - 15.5 %    Platelets 217 150 - 400 K/uL    nRBC 0.0 0.0 - 0.2 %    Neutrophils Relative % 62 %    Neutro Abs 2.6 1.7 - 7.7 K/uL    Lymphocytes Relative 31 %    Lymphs Abs 1.3 0.7 - 4.0 K/uL    Monocytes Relative 5 %    Monocytes Absolute 0.2 0.1 - 1.0 K/uL    Eosinophils Relative 0 %    Eosinophils Absolute 0.0 0.0 - 0.5 K/uL    Basophils Relative 0 %    Basophils Absolute 0.0 0.0 - 0.1 K/uL    Immature Granulocytes 2 %    Abs Immature Granulocytes 0.10 (H) 0.00 - 0.07 K/uL      Comment: Performed at [facility] [address] [place], [place] [address]      Imaging Results (Last 48 hours)  No results  found.           Medical Problem List and Plan: 1.    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  Antithrombotics: -DVT/anticoagulation:  Mechanical: Sequential compression devices, below knee Bilateral lower extremities             -antiplatelet therapy: N/A 3. Pain Management:  Tramadol prn. Denies abdominal pain today.  4. Mood: LCSW to follow for evaluation and support.              -antipsychotic agents: N/A 5. Neuropsych: This patient is capable of making decisions on her own behalf. 6. Skin/Wound Care: Routine pressure relief measures.  7. Fluids/Electrolytes/Nutrition: Monitor I/O. Check lytes in am 8. TBI with SAH/IPH:  9. Anemia: Monitor H/H with serial checks.              --monitor for recurrent tarry stools.              ----H/H has varied form 7.5-->10.5-->8.9 in the past two days.  10. Severe fatty liver w/Abnormal LFTs and coagulopathy: Continue vitamin K thorough 06/23/20.              --to  transfuse prn Hgb<8 or symptomatic              --Liver biopsy 03/31 to rule of AIH (ANA+ and IgG elevated)             --continues to have RLQ pain with activity/palpation.  11. Seronegative RA/Lupus?: On prednisone 40 mg day--> to continue to follow-up with rheumatology? 12.  Thrombocytopenia: Has resolved.              --Monitor and transfuse prn Plt<50 13. Adjustement disorder/depression?: On Remeron  At bedtime.  14.  Sick euthyroid: Continue Synthroid supplement. 15. Hypokalemia: Likely due to steroids and poor intake.             --Supplement started. Recheck in am.                    Bary Leriche, PA-C 06/16/2020         "I have personally performed a face to face diagnostic evaluation of this patient.  Additionally, I have reviewed and concur with the physician assistant's documentation above." Charlett Blake M.D. [facility] Fellow Am [employer] of [employer] and Rehab Diplomate Am Board of Electrodiagnostic Med Fellow Am Board of Interventional Pain

## 2020-06-16 NOTE — Progress Notes (Signed)
Inpatient Rehabilitation Medication Review by a Pharmacist  A complete drug regimen review was completed for this patient to identify any potential clinically significant medication issues.  Clinically significant medication issues were identified:  no  Check AMION for pharmacist assigned to patient if future medication questions/issues arise during this admission.  Pharmacist comments:   Vitamin K 5 mg PO x 14 days begin on 3/29.  Stop date of 06/23/20 is in place.     Time spent performing this drug regimen review (minutes):  [place], [place], [place] 06/16/2020 7:11 PM

## 2020-06-16 NOTE — Progress Notes (Signed)
Inpatient Rehab Admissions Coordinator:   Awaiting determination from [facility], regarding prior auth request for CIR.  Will continue to follow.   Shann Medal, PT, DPT Admissions Coordinator [phone] 06/16/20  11:07 AM

## 2020-06-16 NOTE — Progress Notes (Signed)
Physical Therapy Treatment Patient Details Name: Jessica Frazier MRN: [id number] DOB: July 11, 2000 Today's Date: 06/16/2020    History of Present Illness 20 y/o female presented to ED on 3/21 with chief complaint of abdominal pain and black stools. In ED, patient fell in bathroom and hit head. Head CT found multifocal acute subarachnoid hemmorhage and L parietal hematoma without skull fx. Repeat head CT showed L hypodense subdural hematoma with new midline shift. Evaluating for coagulopathy. PMH: hypothyroidism, RA    PT Comments    Pt resting in bed upon arrival to room, endorses lack of sleep overnight. Pt continues to present with mildly ataxic gait, with x3 LOB requiring PT/OT intervention to correct. Pt struggles most with directional changes at this time, and fatigues quickly. Pt requiring step-by-step cues to sequence tasks today, unsure of pt baseline. HR 130s-140s during gait, recovers with rest. PT continuing to recommend CIR post-acutely.    Follow Up Recommendations  CIR     Equipment Recommendations  Rolling walker with 5" wheels    Recommendations for Other Services       Precautions / Restrictions Precautions Precautions: Fall Restrictions Weight Bearing Restrictions: No    Mobility  Bed Mobility Overal bed mobility: Needs Assistance Bed Mobility: Supine to Sit     Supine to sit: Min assist     General bed mobility comments: minA for trunk elevation, scooting to EOB with use of bed pads.    Transfers Overall transfer level: Needs assistance Equipment used: 2 person hand held assist Transfers: Sit to/from Stand Sit to Stand: Min assist;+2 physical assistance         General transfer comment: min +2 for power up, rise, steadying especially from low surface. STS x5, from various surfaces during session, struggles more with lower surfaces like toilet.  Ambulation/Gait Ambulation/Gait assistance: Min assist;+2 physical assistance;+2  safety/equipment Gait Distance (Feet): 80 Feet (+10+10) Assistive device: 1 person hand held assist Gait Pattern/deviations: Decreased stride length;Ataxic;Drifts right/left;Narrow base of support;Decreased step length - right;Decreased step length - left;Step-through pattern Gait velocity: decr   General Gait Details: min assist to steady, LOB x3 requiring physical assist via gait belt to correct with changing directions both 90 and 180 degree turns. Pt with mild weaving of gait and alternating width BOS, cuing for increasing step length and upright posture.   Stairs             Wheelchair Mobility    Modified Rankin (Stroke Patients Only) Modified Rankin (Stroke Patients Only) Pre-Morbid Rankin Score: No significant disability Modified Rankin: Moderately severe disability     Balance Overall balance assessment: Needs assistance Sitting-balance support: Bilateral upper extremity supported Sitting balance-Leahy Scale: Fair Sitting balance - Comments: min guard for static sitting EOB   Standing balance support: Single extremity supported;During functional activity Standing balance-Leahy Scale: Poor Standing balance comment: short distance gait without UE support, intermittent HHA required to steady self.                            Cognition Arousal/Alertness: Awake/alert Behavior During Therapy: Flat affect Overall Cognitive Status: Impaired/Different from baseline Area of Impairment: Memory;Following commands;Problem solving;Safety/judgement                       Following Commands: Follows one step commands with increased time Safety/Judgement: Decreased awareness of safety;Decreased awareness of deficits Awareness: Emergent Problem Solving: Slow processing;Difficulty sequencing;Requires verbal cues;Decreased initiation General Comments: flat affect, increased time  to complete mobility tasks when cued. Pt appears to struggle with sequencing tasks,  requires step-by-step instruction for basic tasks (ie scooting back in recliner, STS from chair with armrests). Pt can be self-limiting, stating "I can't" before attempting tasks.      Exercises      General Comments        Pertinent Vitals/Pain Pain Assessment: Faces Faces Pain Scale: Hurts little more Pain Location: wrists/hands, feet due to RA Pain Descriptors / Indicators: Grimacing;Discomfort;Aching Pain Intervention(s): Limited activity within patient's tolerance;Monitored during session;Repositioned    Home Living                      Prior Function            PT Goals (current goals can now be found in the care plan section) Acute Rehab PT Goals Patient Stated Goal: to get better PT Goal Formulation: With patient Time For Goal Achievement: 06/21/20 Potential to Achieve Goals: Fair Progress towards PT goals: Progressing toward goals    Frequency    Min 4X/week      PT Plan Current plan remains appropriate    Co-evaluation PT/OT/SLP Co-Evaluation/Treatment: Yes Reason for Co-Treatment: For patient/therapist safety;To address functional/ADL transfers PT goals addressed during session: Mobility/safety with mobility;Balance;Strengthening/ROM        AM-PAC PT "6 Clicks" Mobility   Outcome Measure  Help needed turning from your back to your side while in a flat bed without using bedrails?: A Little Help needed moving from lying on your back to sitting on the side of a flat bed without using bedrails?: A Little Help needed moving to and from a bed to a chair (including a wheelchair)?: A Little Help needed standing up from a chair using your arms (e.g., wheelchair or bedside chair)?: A Little Help needed to walk in hospital room?: A Little Help needed climbing 3-5 steps with a railing? : A Lot 6 Click Score: 17    End of Session Equipment Utilized During Treatment: Gait belt Activity Tolerance: Patient tolerated treatment well;Patient limited by  fatigue Patient left: in chair;with call bell/phone within reach;with chair alarm set;with family/visitor present Nurse Communication: Mobility status PT Visit Diagnosis: Unsteadiness on feet (R26.81);Other abnormalities of gait and mobility (R26.89);Muscle weakness (generalized) (M62.81);History of falling (Z91.81);Other symptoms and signs involving the nervous system (R29.898)     Time: 5397-6734 PT Time Calculation (min) (ACUTE ONLY): 32 min  Charges:  $Gait Training: 8-22 mins                    Stacie Glaze, PT Acute Rehabilitation Services Pager [phone]  Office [phone]   [place] 06/16/2020, 11:22 AM

## 2020-06-16 NOTE — Progress Notes (Signed)
Patient transferred to [facility] with RX for Tramadol, Multivitamin, Prednisone.

## 2020-06-16 NOTE — Progress Notes (Signed)
Inpatient Rehab Admissions Coordinator:    I have insurance approval and a bed available for pt to admit to CIR today. Dr. Verlon Au in agreement.  Will let pt/family and TOC team know.   Shann Medal, PT, DPT Admissions Coordinator [phone] 06/16/20  12:49 PM

## 2020-06-17 DIAGNOSIS — S069X1S Unspecified intracranial injury with loss of consciousness of 30 minutes or less, sequela: Secondary | ICD-10-CM

## 2020-06-17 DIAGNOSIS — E876 Hypokalemia: Secondary | ICD-10-CM

## 2020-06-17 DIAGNOSIS — M0609 Rheumatoid arthritis without rheumatoid factor, multiple sites: Secondary | ICD-10-CM

## 2020-06-17 DIAGNOSIS — K760 Fatty (change of) liver, not elsewhere classified: Secondary | ICD-10-CM

## 2020-06-17 LAB — BASIC METABOLIC PANEL
Anion gap: 7 (ref 5–15)
BUN: 10 mg/dL (ref 6–20)
CO2: 25 mmol/L (ref 22–32)
Calcium: 8.5 mg/dL — ABNORMAL LOW (ref 8.9–10.3)
Chloride: 104 mmol/L (ref 98–111)
Creatinine, Ser: 0.35 mg/dL — ABNORMAL LOW (ref 0.44–1.00)
GFR, Estimated: >60 mL/min (ref >60)
Glucose, Bld: 85 mg/dL (ref 70–99)
Potassium: 3.7 mmol/L (ref 3.5–5.1)
Sodium: 136 mmol/L (ref 135–145)

## 2020-06-17 LAB — CBC WITH DIFFERENTIAL/PLATELET
Abs Immature Granulocytes: 0.12 10*3/uL — ABNORMAL HIGH (ref 0.00–0.07)
Basophils Absolute: 0 10*3/uL (ref 0.0–0.1)
Basophils Relative: 0 %
Eosinophils Absolute: 0 10*3/uL (ref 0.0–0.5)
Eosinophils Relative: 0 %
HCT: 26.9 % — ABNORMAL LOW (ref 36.0–46.0)
Hemoglobin: 8.5 g/dL — ABNORMAL LOW (ref 12.0–15.0)
Immature Granulocytes: 2 %
Lymphocytes Relative: 22 %
Lymphs Abs: 1.1 10*3/uL (ref 0.7–4.0)
MCH: 30.8 pg (ref 26.0–34.0)
MCHC: 31.6 g/dL (ref 30.0–36.0)
MCV: 97.5 fL (ref 80.0–100.0)
Monocytes Absolute: 0.2 10*3/uL (ref 0.1–1.0)
Monocytes Relative: 4 %
Neutro Abs: 3.5 10*3/uL (ref 1.7–7.7)
Neutrophils Relative %: 72 %
Platelets: 246 10*3/uL (ref 150–400)
RBC: 2.76 MIL/uL — ABNORMAL LOW (ref 3.87–5.11)
RDW: 18.5 % — ABNORMAL HIGH (ref 11.5–15.5)
WBC: 4.9 10*3/uL (ref 4.0–10.5)
nRBC: 0 % (ref 0.0–0.2)

## 2020-06-17 LAB — COMPREHENSIVE METABOLIC PANEL
ALT: 106 U/L — ABNORMAL HIGH (ref 0–44)
AST: 126 U/L — ABNORMAL HIGH (ref 15–41)
Albumin: 2.2 g/dL — ABNORMAL LOW (ref 3.5–5.0)
Alkaline Phosphatase: 178 U/L — ABNORMAL HIGH (ref 38–126)
Anion gap: 4 — ABNORMAL LOW (ref 5–15)
BUN: 8 mg/dL (ref 6–20)
CO2: 26 mmol/L (ref 22–32)
Calcium: 8.2 mg/dL — ABNORMAL LOW (ref 8.9–10.3)
Chloride: 103 mmol/L (ref 98–111)
Creatinine, Ser: 0.37 mg/dL — ABNORMAL LOW (ref 0.44–1.00)
GFR, Estimated: >60 mL/min (ref >60)
Glucose, Bld: 105 mg/dL — ABNORMAL HIGH (ref 70–99)
Potassium: 3.3 mmol/L — ABNORMAL LOW (ref 3.5–5.1)
Sodium: 133 mmol/L — ABNORMAL LOW (ref 135–145)
Total Bilirubin: 0.7 mg/dL (ref 0.3–1.2)
Total Protein: 7.2 g/dL (ref 6.5–8.1)

## 2020-06-17 LAB — PROTIME-INR
INR: 1.1 (ref 0.8–1.2)
Prothrombin Time: 13.8 seconds (ref 11.4–15.2)

## 2020-06-17 LAB — APTT: aPTT: 35 seconds (ref 24–36)

## 2020-06-17 LAB — VITAMIN B1: Vitamin B1 (Thiamine): 82.9 nmol/L (ref 66.5–200.0)

## 2020-06-17 MED ORDER — ZINC SULFATE 220 (50 ZN) MG PO CAPS
220.0000 mg | ORAL_CAPSULE | Freq: Every day | ORAL | Status: DC
  Administered 2020-06-18 – 2020-06-25 (×8): 220 mg via ORAL
  Filled 2020-06-17 (×8): qty 1

## 2020-06-17 NOTE — Progress Notes (Addendum)
Initial Nutrition Assessment  DOCUMENTATION CODES:   Severe malnutrition in context of chronic illness  INTERVENTION:   - Continue Ensure Enlive po TID, each supplement provides 350 kcal and 20 grams of protein  - Continue vitamin and mineral supplementation  - Encourage adequate PO intake  NUTRITION DIAGNOSIS:   Severe Malnutrition related to chronic illness as evidenced by moderate fat depletion,severe fat depletion,moderate muscle depletion,severe muscle depletion,percent weight loss (18.9% weight loss in 10 months).  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 Admitted to CIR on 4/04.   Spoke with pt while she was eating lunch. Pt had completed ~25% of chicken sandwich at time of RD visit. Pt also drank some soda and some Ensure during RD visit.  Pt reports that she has not had any N/V since she was admitted to acute care. She reports that the N/V she experienced at home was related to coughing up phlegm.  Pt reports appetite is okay. She believes that it is improved compared to when she was admitted to acute care. Pt reports that PTA she consumed 2 meals and 2 snacks daily. She might wake up early or later but would typically eat for the first time around 1:00 pm. At this time, pt would have a snack (example: chips). Pt would then eat lunch around 2:30/3:00 pm. Pt not very forthcoming about what she might eat for lunch but reports that it would be something that her mother cooked. Pt would eat another snack then  have dinner "very late" around 8:00/9:00 pm. Dinner would also be something that pt's mother would prepare for her.  Noted pt with food and snacks from home in her room. Pt reports that she has adequate access to foods that she enjoys.  RD discussed with pt the importance of adequate PO intake in increasing lean muscle mass and promoting healing. Pt expresses understanding. She is willing to consume at least one Ensure supplement a day. Pt reports that these supplements "go right through me."  Discussed weight history with pt. Pt reports that she began losing weight "a while ago." When asked to clarify, pt reports that it was "years" ago. Pt reports that her weight at that time was 145 lbs. She states that she lost weight down to 125 lbs and maintained there for a while before losing additional weight.  Reviewed weight history in chart. Pt with a 10.5 kg weight loss since 08/07/19. This is an 18.9% weight loss in 10 months which is severe and significant for timeframe. Pt meets criteria for malnutrition.  Discussed zinc supplementation with MD given low lab value. MD agreed and orders placed.  Meal Completion: 0% x 1 documented meal  Medications reviewed and include: B-complex with vitamin C, cholecalciferol, Ensure Enlive TID, remeron, MVI with minerals, vitamin K, klor-con 20 mEq BID, prednisone, thiamine  Labs reviewed: sodium 133, potassium 3.3, elevated LFTs, hemoglobin 8.5  Vitamin/Mineral Profile:  Thiamine B1: 82.9 (WNL) Folate B9: 6.9 (WNL) Vitamin A: 21.9 (low normal) Vitamin D: 27.24 (low) Vitamin C: 0.4 (low normal) Zinc: 41 (low)  NUTRITION -  FOCUSED PHYSICAL EXAM:  Flowsheet Row Most Recent Value  Orbital Region Moderate depletion  Upper Arm Region Severe depletion  Thoracic and Lumbar Region Unable to assess  Buccal Region Moderate depletion  Temple Region Moderate depletion  Clavicle Bone Region Severe depletion  Clavicle and Acromion Bone Region Severe depletion   Scapular Bone Region Severe depletion  Dorsal Hand Severe depletion  Patellar Region Mild depletion  Anterior Thigh Region Mild depletion  Posterior Calf Region Mild depletion  Edema (RD Assessment) None  Hair Reviewed  [dry]  Eyes Reviewed  Mouth Reviewed  Skin Reviewed  Nails Reviewed       Diet Order:   Diet Order            Diet regular Room service appropriate? Yes; Fluid consistency: Thin  Diet effective now                 EDUCATION NEEDS:   Education needs have been addressed  Skin:  Skin Assessment: Skin Integrity Issues: Stage II: coccyx  Last BM:  06/17/20 type 2  Height:   Ht Readings from Last 1 Encounters:  06/16/20 5\' 2"  (1.575 m)    Weight:   Wt Readings from Last 1 Encounters:  06/17/20 45.1 kg    BMI:  Body mass index is 18.19 kg/m.  Estimated Nutritional Needs:   Kcal:  1900-2100  Protein:  80-95 grams  Fluid:  >/= 2.0 L    Gustavus Bryant, MS, RD, LDN Inpatient Clinical Dietitian Please see [facility] for contact information.

## 2020-06-17 NOTE — Progress Notes (Signed)
PROGRESS NOTE   Subjective/Complaints: Had a pretty good night. Was able to sleep. Denies any pain.   ROS: Patient denies fever, rash, sore throat, blurred vision, nausea, vomiting, diarrhea, cough, shortness of breath or chest pain, joint or back pain, headache, or mood change.    Objective:   No results found. Recent Labs    06/16/20 0605 06/17/20 0011  WBC 4.2 4.9  HGB 8.9* 8.5*  HCT 27.5* 26.9*  PLT 217 246   Recent Labs    06/17/20 0011 06/17/20 0912  NA 133* 136  K 3.3* 3.7  CL 103 104  CO2 26 25  GLUCOSE 105* 85  BUN 8 10  CREATININE 0.37* 0.35*  CALCIUM 8.2* 8.5*    Intake/Output Summary (Last 24 hours) at 06/17/2020 1054 Last data filed at 06/17/2020 1033 Gross per 24 hour  Intake 120 ml  Output --  Net 120 ml     Pressure Injury 06/16/20 Coccyx Medial Stage 2 -  Partial thickness loss of dermis presenting as a shallow open injury with a red, pink wound bed without slough. small area with broken skin (Active)  06/16/20 1530  Location: Coccyx  Location Orientation: Medial  Staging: Stage 2 -  Partial thickness loss of dermis presenting as a shallow open injury with a red, pink wound bed without slough.  Wound Description (Comments): small area with broken skin  Present on Admission: Yes    Physical Exam: Vital Signs Blood pressure 122/82, pulse 80, temperature 99.5 F (37.5 C), temperature source Oral, resp. rate 17, height 5\' 2"  (1.575 m), weight 45.1 kg, SpO2 100 %.  General: Alert and oriented   No apparent distress HEENT: Head is normocephalic, atraumatic, PERRLA, EOMI, sclera anicteric, oral mucosa pink and moist, dentition intact, ext ear canals clear,  Neck: Supple without JVD or lymphadenopathy Heart: Reg rate and rhythm. No murmurs rubs or gallops Chest: CTA bilaterally without wheezes, rales, or rhonchi; no distress Abdomen: Soft, non-tender, non-distended, bowel sounds  positive. Extremities: No clubbing, cyanosis, or edema. Pulses are 2+ Psych: Pt's affect is appropriate. Pt is cooperative Skin: Clean and intact without signs of breakdown Neuro: Reasonable insight, memory, and awareness. Cranial nerves 2-12 are intact. Sensory exam is normal. Reflexes are 1+ in all 4's. Fine motor coordination is intact. No tremors. Motor function is grossly 4/5 in all 4's.  Musculoskeletal: RA deformities in fingers     Assessment/Plan: 1. Functional deficits which require 3+ hours per day of interdisciplinary therapy in a comprehensive inpatient rehab setting.  Physiatrist is providing close team supervision and 24 hour management of active medical problems listed below.  Physiatrist and rehab team continue to assess barriers to discharge/monitor patient progress toward functional and medical goals  Care Tool:  Bathing              Bathing assist       Upper Body Dressing/Undressing Upper body dressing   What is the patient wearing?: Hospital gown only    Upper body assist Assist Level: Minimal Assistance - Patient > 75%    Lower Body Dressing/Undressing Lower body dressing            Lower body assist  Toileting Toileting    Toileting assist Assist for toileting: Independent with assistive device Assistive Device Comment: walker   Transfers Chair/bed transfer  Transfers assist           Locomotion Ambulation   Ambulation assist              Walk 10 feet activity   Assist           Walk 50 feet activity   Assist           Walk 150 feet activity   Assist           Walk 10 feet on uneven surface  activity   Assist           Wheelchair     Assist               Wheelchair 50 feet with 2 turns activity    Assist            Wheelchair 150 feet activity     Assist          Blood pressure 122/82, pulse 80, temperature 99.5 F (37.5 C), temperature source  Oral, resp. rate 17, height 5\' 2"  (1.575 m), weight 45.1 kg, SpO2 100 %.  Medical Problem List and Plan: 1.   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 Initial team conference discussions today 2. Antithrombotics: -DVT/anticoagulation:Mechanical:Sequential compression devices, below kneeBilateral lower extremities, INR 1.1, APTT 35 -vitamin k thru 4/11 -antiplatelet therapy: N/A 3. Pain Management:Tramadol prn. Denies abdominal pain today. 4. Mood:LCSW to follow for evaluation and support. -antipsychotic agents: N/A 5. Neuropsych: This patientiscapable of making decisions onherown behalf. 6. Skin/Wound Care:Routine pressure relief measures. 7. Fluids/Electrolytes/Nutrition:encourage appropriate PO 8. TBI with SAH/IPH:  9. Anemia: Monitor H/H with serial checks.  --monitor for recurrent tarry stools.  ----H/H variable, 8.5 4/5 10. Severe fatty liver w/Abnormal LFTs and coagulopathy:Continue vitamin K thorough 06/23/20.  --to transfuse prn Hgb<8 or symptomatic  --Liver biopsy 03/31 to rule of AIH (ANA+ and IgG elevated) --has demonstrated RLQ pain with activity/palpation.  11. Seronegative RA/Lupus?: On prednisone 40 mg day-->to continue to follow-up with rheumatology? 12.Thrombocytopenia: Has resolved. --Monitor and transfuse prn Plt<50 13. Adjustement disorder/depression?: On Remeron At bedtime.  14.Sick euthyroid: Continue Synthroid supplement. 15. Hypokalemia:Likely due to steroids and poor intake. --3.7 4/5, continue supplement.    LOS: 1 days A FACE TO FACE EVALUATION WAS PERFORMED  Meredith Staggers 06/17/2020, 10:54 AM

## 2020-06-17 NOTE — Progress Notes (Signed)
Patient ID: Jessica Frazier, female   DOB: 02/11/2001, 20 y.o.   MRN: [id number]  SW met with pt in room to provide updates from team conference, and ELOS 9-12 days. Pt states her mother would be best contact, and stated an interpreter will be needed when contacting her.    Loralee Pacas, MSW, [place] Office: [phone] Cell: [phone] Fax: [phone]

## 2020-06-17 NOTE — Evaluation (Signed)
Occupational Therapy Assessment and Plan  Patient Details  Name: Jessica Frazier MRN: [id number] Date of Birth: 06/02/2000  OT Diagnosis: cognitive deficits, muscle weakness (generalized) and decreased activity tolerance, dynamic standing balance Rehab Potential: Rehab Potential (ACUTE ONLY): Good ELOS: 9 to 12 days   Today's Date: 06/17/2020 OT Individual Time: 1000-1109 OT Individual Time Calculation (min): 69 min     Hospital Problem: Active Problems:   TBI (traumatic brain injury) [facility])   Past Medical History:  Past Medical History:  Diagnosis Date  . Bell's palsy   . Hypothyroidism   . Rheumatoid arthritis ([place])    Past Surgical History: History reviewed. No pertinent surgical history.  Assessment & Plan Clinical Impression: Patient is a 20 y.o. year old female with history of seronegative RA, Bell's palsy, hypothyroid, abnormal LFTs felt to be due Rinvoq who was admitted on 06/02/20 with poor po intake, muscle wasting, epigastric pain and BLE edema. She was found to have black stools with pancytopenia.She sustained a fall in ED and was found to have multifocal acute SAH predominantly over parietal lobes. She was also found to have severe fatty liver with steatohepatitis, coagulopathy with INR 2.6 and FFP was ordered but discontinued due to fever. Dr. Arnoldo Morale was consulted for input and recommended monitoring with serial CT of head as patient was neurologically stable. Dr. Irene Limbo recommended continuing vitamin K for 2 weeks, to monitor PT/INR/Fibrinogen, transfuse platelets if less than 50, INR 1.5 or fibrinogen <200 as well as taper steroids over 2 weeks.  Bone biopsy was done for work-up and revealed pancytopenia with normocellular bone marrow with trilineage hematopoiesis including megakaryocytes--questionedto be secondary to RA/liver disease. She did have progression of ICH with increase in size, edema and mass-effect with enlargement of large left parietal scalp  hematoma. She was not felt to be a surgical candidate and medical management recommended as she was neurologically stable. Dr. Dagar/psychiatry was consulted for input due to concerns of depression and anorexia but felt that weight loss was due to diarrhea and vomiting rather than psychiatric disorder.Dr. Hung/GIrecommended I need to work-up his LFTs remain persistently elevated and she underwent ultrasound-guided biopsy of liver by Dr. Earleen Newport on 03/31.She developed fever with lactic acidosis on 04/02 --question sepsis and treated with fluids and antibiotics briefly with improvement. Antibiotics d/c 04/03 and she has been for the past 24 hours. She continues to be limited by abdominal pain with weakness, balance deficits as well as delayed processing. CIR recommended due to functional decline.Patient transferred to CIR on 06/16/2020 .    Patient currently requires mod A for LB ADL, S for UB ADL, CGA for transfers with basic self-care skills secondary to muscle weakness, decreased cardiorespiratoy endurance, decreased motor planning, decreased initiation, decreased problem solving and flat affect and decreased standing balance and decreased balance strategies.  Prior to hospitalization, patient could complete ADL with overall min A 2/2 chronic illness.  Patient will benefit from skilled intervention to decrease level of assist with basic self-care skills, increase independence with basic self-care skills and increase level of independence with iADL prior to discharge home with care partner.  Anticipate patient will require intermittent supervision and follow up home health.  OT - End of Session Activity Tolerance: Tolerates 10 - 20 min activity with multiple rests Endurance Deficit: Yes Endurance Deficit Description: req increased time to complete ADL, req to remain seated for grooming OT Assessment Rehab Potential (ACUTE ONLY): Good OT Barriers to Discharge: Weight OT Patient demonstrates  impairments in the following area(s):  Balance;Cognition;Endurance;Motor OT Basic ADL's Functional Problem(s): Eating;Grooming;Bathing;Dressing;Toileting OT Advanced ADL's Functional Problem(s): Simple Meal Preparation OT Transfers Functional Problem(s): Toilet;Tub/Shower OT Additional Impairment(s): None OT Plan OT Intensity: Minimum of 1-2 x/day, 45 to 90 minutes OT Frequency: Total of 15 hours over 7 days of combined therapies OT Duration/Estimated Length of Stay: 9 to 12 days OT Treatment/Interventions: Balance/vestibular training;Cognitive remediation/compensation;Community reintegration;Discharge planning;DME/adaptive equipment instruction;Disease mangement/prevention;Functional mobility training;Pain management;Neuromuscular re-education;Patient/family education;Self Care/advanced ADL retraining;Therapeutic Exercise;UE/LE Coordination activities;Wheelchair propulsion/positioning;UE/LE Strength taining/ROM;Therapeutic Activities;Skin care/wound managment;Psychosocial support OT Self Feeding Anticipated Outcome(s): ind OT Basic Self-Care Anticipated Outcome(s): mod I OT Toileting Anticipated Outcome(s): mod I OT Bathroom Transfers Anticipated Outcome(s): S OT Recommendation Recommendations for Other Services: Speech consult;Neuropsych consult;Therapeutic Recreation consult Therapeutic Recreation Interventions: Pet therapy;Kitchen group;Stress management;Outing/community reintergration Patient destination: Home Follow Up Recommendations: Home health OT;Outpatient OT Equipment Recommended: None recommended by OT   OT Evaluation Precautions/Restrictions  Restrictions Weight Bearing Restrictions: No General Chart Reviewed: (P) Yes Pain Pain Assessment Pain Scale: 0-10 Pain Score: 0-No pain Home Living/Prior Functioning Home Living Family/patient expects to be discharged to:: Private residence Living Arrangements: Parent,Other relatives Available Help at Discharge: Family,Available  24 hours/day Type of Home: House Bathroom Shower/Tub: Chiropodist: Standard  Lives With: Family IADL History Occupation: Unemployed Leisure and Hobbies: watching movies Prior Function Level of Independence: Needs assistance with ADLs,Needs assistance with gait (need to verify) Bath: Moderate Dressing: Moderate Driving: Yes (need to verify) Vision Baseline Vision/History: Wears glasses Wears Glasses: At all times Patient Visual Report: No change from baseline Vision Assessment?: No apparent visual deficits Perception  Perception: Within Functional Limits Praxis Praxis: Intact Cognition Overall Cognitive Status: Impaired/Different from baseline Arousal/Alertness: Awake/alert Orientation Level: Person;Place;Situation Person: Oriented Place: Oriented Situation: Oriented Year: 2022 Month: April Day of Week: Incorrect Memory: Appears intact Immediate Memory Recall: Sock;Bed;Blue Memory Recall Sock: Without Cue Memory Recall Blue: Without Cue Memory Recall Bed: Without Cue Attention: Focused;Sustained Sustained Attention: Impaired Problem Solving: Impaired Problem Solving Impairment: Functional basic;Verbal basic Safety/Judgment: Appears intact Sensation Sensation Light Touch: Appears Intact Hot/Cold: Appears Intact Proprioception: Appears Intact Stereognosis: Appears Intact Additional Comments: Reports tingling in B feet, is able to appreciate light touch to  B toes. Coordination Gross Motor Movements are Fluid and Coordinated: No (slowed 2/2 generalized weakness) Fine Motor Movements are Fluid and Coordinated: Yes Motor  Motor Motor: Other (comment) Motor - Skilled Clinical Observations: overall slowed 2/2 generalized weakness  Trunk/Postural Assessment  Cervical Assessment Cervical Assessment: Within Functional Limits Thoracic Assessment Thoracic Assessment: Within Functional Limits Lumbar Assessment Lumbar Assessment: Within Functional  Limits Postural Control Postural Control: Within Functional Limits  Balance Balance Balance Assessed: Yes Static Sitting Balance Static Sitting - Balance Support: Feet supported Static Sitting - Level of Assistance: 5: Stand by assistance Dynamic Sitting Balance Dynamic Sitting - Balance Support: Feet supported;During functional activity;No upper extremity supported Dynamic Sitting - Level of Assistance: 5: Stand by assistance Static Standing Balance Static Standing - Balance Support: Bilateral upper extremity supported Static Standing - Level of Assistance: 5: Stand by assistance Dynamic Standing Balance Dynamic Standing - Balance Support: Bilateral upper extremity supported;During functional activity Dynamic Standing - Level of Assistance: 5: Stand by assistance Dynamic Standing - Balance Activities: Reaching for objects;Reaching across midline;Lateral lean/weight shifting;Forward lean/weight shifting Extremity/Trunk Assessment RUE Assessment RUE Assessment: Exceptions to [facility] Active Range of Motion (AROM) Comments: ~100 degrees shoulder flexion General Strength Comments: 3+/5 in shoulder flexion, weak grip -> req assist to open shampoo bottle LUE Assessment LUE Assessment: Exceptions to [facility] Active Range of Motion (  AROM) Comments: ~100 degrees shoulder flexion General Strength Comments: 3+/5 in shoulder flexion, weak grip -> req assist to open shampoo bottle  Care Tool Care Tool Self Care Eating   Eating Assist Level: Set up assist    Oral Care    Oral Care Assist Level: Set up assist    Bathing   Body parts bathed by patient: Right arm;Left arm;Abdomen;Front perineal area;Chest;Buttocks;Right upper leg;Left upper leg;Face   Body parts n/a: Left lower leg;Right lower leg Assist Level: Minimal Assistance - Patient > 75%    Upper Body Dressing(including orthotics)   What is the patient wearing?: Hospital gown only;Pull over shirt   Assist Level: Supervision/Verbal  cueing    Lower Body Dressing (excluding footwear)   What is the patient wearing?: [place] only;Underwear/pull up;Pants Assist for lower body dressing: Maximal Assistance - Patient 25 - 49%    Putting on/Taking off footwear   What is the patient wearing?: Non-skid slipper socks Assist for footwear: Total Assistance - Patient < 25%       Care Tool Toileting Toileting activity   Assist for toileting: Minimal Assistance - Patient > 75%     Care Tool Bed Mobility Roll left and right activity   Roll left and right assist level: Minimal Assistance - Patient > 75%    Sit to lying activity   Sit to lying assist level: Minimal Assistance - Patient > 75%    Lying to sitting edge of bed activity   Lying to sitting edge of bed assist level: Minimal Assistance - Patient > 75%     Care Tool Transfers Sit to stand transfer   Sit to stand assist level: Contact Guard/Touching assist    Chair/bed transfer   Chair/bed transfer assist level: Contact Guard/Touching assist     Toilet transfer   Assist Level: Contact Guard/Touching assist     Care Tool Cognition Expression of Ideas and Wants Expression of Ideas and Wants: Without difficulty (complex and basic) - expresses complex messages without difficulty and with speech that is clear and easy to understand   Understanding Verbal and Non-Verbal Content Understanding Verbal and Non-Verbal Content: Usually understands - understands most conversations, but misses some part/intent of message. Requires cues at times to understand   Memory/Recall Ability *first 3 days only Memory/Recall Ability *first 3 days only: Current season;Staff names and faces;That he or she is in a hospital/hospital unit    Refer to Care Plan for [place] 1 OT Short Term Goal 1 (Week 1): NA 2/2 ELOS  Recommendations for other services: Neuropsych and Therapeutic Recreation  Pet therapy, Kitchen group, Stress management and  Outing/community reintegration   Skilled Therapeutic Intervention ADL ADL Eating: Set up Where Assessed-Eating: Bed level Grooming: Setup Where Assessed-Grooming: Sitting at sink Upper Body Bathing: Supervision/safety Where Assessed-Upper Body Bathing: Shower Lower Body Bathing: Moderate assistance Where Assessed-Lower Body Bathing: Shower Upper Body Dressing: Supervision/safety Where Assessed-Upper Body Dressing: Chair Lower Body Dressing: Maximal assistance Where Assessed-Lower Body Dressing: Chair Toileting: Contact guard Where Assessed-Toileting: Glass blower/designer: Therapist, music Method: Counselling psychologist: Raised toilet seat;Grab bars Social research officer, government: Curator Method: Heritage manager: Shower seat with back Mobility  Bed Mobility Bed Mobility: Supine to Sit Supine to Sit: Moderate Assistance - Patient 50-74% Transfers Sit to Stand: Contact Guard/Touching assist Stand to Sit: Contact Guard/Touching assist  Session Note: Pt received returning to bed with NT, agreeable to OT eval,  denies pain. Reviewed role of CIR OT, evaluation process, ADL/func mobility retraining, goals for therapy, and safety plan. Evaluation assessments completed as documented above with session focus on showering, dressing, toileting, and functional mobility. Pt able to progress BLE off bed, but requires total A to lift trunk. Noted flat affect and impaired initiation and problem solving throughout session (asking questions on how to sequence shower transfer, req step by step cues for toileting, etc). However, pt was excited about being able to take shower. Amb to and from bathroom with use of RW and overall CGA. Completed seated pericare with close S and standing LB clothing management CGA. Req help to open shampoo bottle 2/2 weak grasp. Seated in chair at sink, completed oral care with set-up A and and dried hair  total A. Amb transfer to recliner, pt left in recliner with RN present, chair alarm engaged, call bell in reach, and all immediate needs met. States her primary deficit is her endurance and would like to be able to do as much as possible for herself upon DC.   Discharge Criteria: Patient will be discharged from OT if patient refuses treatment 3 consecutive times without medical reason, if treatment goals not met, if there is a change in medical status, if patient makes no progress towards goals or if patient is discharged from hospital.  The above assessment, treatment plan, treatment alternatives and goals were discussed and mutually agreed upon: by patient  Volanda Napoleon MS, OTR/L  06/17/2020, 11:16 AM

## 2020-06-17 NOTE — Plan of Care (Signed)
  Problem: RH Balance Goal: LTG Patient will maintain dynamic sitting balance (PT) Description: LTG:  Patient will maintain dynamic sitting balance with assistance during mobility activities (PT) Flowsheets (Taken 06/17/2020 1640) LTG: Pt will maintain dynamic sitting balance during mobility activities with:: Independent with assistive device  Goal: LTG Patient will maintain dynamic standing balance (PT) Description: LTG:  Patient will maintain dynamic standing balance with assistance during mobility activities (PT) Flowsheets (Taken 06/17/2020 1640) LTG: Pt will maintain dynamic standing balance during mobility activities with:: Supervision/Verbal cueing   Problem: Sit to Stand Goal: LTG:  Patient will perform sit to stand with assistance level (PT) Description: LTG:  Patient will perform sit to stand with assistance level (PT) Flowsheets (Taken 06/17/2020 1640) LTG: PT will perform sit to stand in preparation for functional mobility with assistance level: Independent with assistive device   Problem: RH Bed Mobility Goal: LTG Patient will perform bed mobility with assist (PT) Description: LTG: Patient will perform bed mobility with assistance, with/without cues (PT). Flowsheets (Taken 06/17/2020 1640) LTG: Pt will perform bed mobility with assistance level of: Independent with assistive device    Problem: RH Bed to Chair Transfers Goal: LTG Patient will perform bed/chair transfers w/assist (PT) Description: LTG: Patient will perform bed to chair transfers with assistance (PT). Flowsheets (Taken 06/17/2020 1640) LTG: Pt will perform Bed to Chair Transfers with assistance level: Independent with assistive device    Problem: RH Car Transfers Goal: LTG Patient will perform car transfers with assist (PT) Description: LTG: Patient will perform car transfers with assistance (PT). Flowsheets (Taken 06/17/2020 1640) LTG: Pt will perform car transfers with assist:: Set up assist    Problem: RH Furniture  Transfers Goal: LTG Patient will perform furniture transfers w/assist (OT/PT) Description: LTG: Patient will perform furniture transfers  with assistance (OT/PT). Flowsheets (Taken 06/17/2020 1640) LTG: Pt will perform furniture transfers with assist:: Independent with assistive device    Problem: RH Ambulation Goal: LTG Patient will ambulate in home environment (PT) Description: LTG: Patient will ambulate in home environment, # of feet with assistance (PT). Flowsheets (Taken 06/17/2020 1640) LTG: Pt will ambulate in home environ  assist needed:: Supervision/Verbal cueing LTG: Ambulation distance in home environment: at least 50 feet with no AD Goal: LTG Patient will ambulate in community environment (PT) Description: LTG: Patient will ambulate in community environment, # of feet with assistance (PT). Flowsheets (Taken 06/17/2020 1640) LTG: Ambulation distance in community environment: at least 200 feet with LRAD   Problem: RH Stairs Goal: LTG Patient will ambulate up and down stairs w/assist (PT) Description: LTG: Patient will ambulate up and down # of stairs with assistance (PT) Flowsheets (Taken 06/17/2020 1640) LTG: Pt will ambulate up/down stairs assist needed:: Supervision/Verbal cueing LTG: Pt will  ambulate up and down number of stairs: at least one 6" step with HR as per home setup

## 2020-06-17 NOTE — Plan of Care (Signed)
  Problem: RH Balance Goal: LTG: Patient will maintain dynamic sitting balance (OT) Description: LTG:  Patient will maintain dynamic sitting balance with assistance during activities of daily living (OT) Flowsheets (Taken 06/17/2020 1510) LTG: Pt will maintain dynamic sitting balance during ADLs with: Independent with assistive device Goal: LTG Patient will maintain dynamic standing with ADLs (OT) Description: LTG:  Patient will maintain dynamic standing balance with assist during activities of daily living (OT)  Flowsheets (Taken 06/17/2020 1510) LTG: Pt will maintain dynamic standing balance during ADLs with: Supervision/Verbal cueing   Problem: Sit to Stand Goal: LTG:  Patient will perform sit to stand in prep for activites of daily living with assistance level (OT) Description: LTG:  Patient will perform sit to stand in prep for activites of daily living with assistance level (OT) Flowsheets (Taken 06/17/2020 1510) LTG: PT will perform sit to stand in prep for activites of daily living with assistance level: Independent with assistive device   Problem: RH Eating Goal: LTG Patient will perform eating w/assist, cues/equip (OT) Description: LTG: Patient will perform eating with assist, with/without cues using equipment (OT) Flowsheets (Taken 06/17/2020 1510) LTG: Pt will perform eating with assistance level of: Independent   Problem: RH Grooming Goal: LTG Patient will perform grooming w/assist,cues/equip (OT) Description: LTG: Patient will perform grooming with assist, with/without cues using equipment (OT) Flowsheets (Taken 06/17/2020 1510) LTG: Pt will perform grooming with assistance level of: Independent with assistive device    Problem: RH Bathing Goal: LTG Patient will bathe all body parts with assist levels (OT) Description: LTG: Patient will bathe all body parts with assist levels (OT) Flowsheets (Taken 06/17/2020 1510) LTG: Pt will perform bathing with assistance level/cueing:  Supervision/Verbal cueing   Problem: RH Dressing Goal: LTG Patient will perform upper body dressing (OT) Description: LTG Patient will perform upper body dressing with assist, with/without cues (OT). Flowsheets (Taken 06/17/2020 1510) LTG: Pt will perform upper body dressing with assistance level of: Independent with assistive device Goal: LTG Patient will perform lower body dressing w/assist (OT) Description: LTG: Patient will perform lower body dressing with assist, with/without cues in positioning using equipment (OT) Flowsheets (Taken 06/17/2020 1510) LTG: Pt will perform lower body dressing with assistance level of: Supervision/Verbal cueing   Problem: RH Toileting Goal: LTG Patient will perform toileting task (3/3 steps) with assistance level (OT) Description: LTG: Patient will perform toileting task (3/3 steps) with assistance level (OT)  Flowsheets (Taken 06/17/2020 1510) LTG: Pt will perform toileting task (3/3 steps) with assistance level: Independent with assistive device   Problem: RH Simple Meal Prep Goal: LTG Patient will perform simple meal prep w/assist (OT) Description: LTG: Patient will perform simple meal prep with assistance, with/without cues (OT). Flowsheets (Taken 06/17/2020 1510) LTG: Pt will perform simple meal prep with assistance level of: Supervision/Verbal cueing   Problem: RH Toilet Transfers Goal: LTG Patient will perform toilet transfers w/assist (OT) Description: LTG: Patient will perform toilet transfers with assist, with/without cues using equipment (OT) Flowsheets (Taken 06/17/2020 1510) LTG: Pt will perform toilet transfers with assistance level of: Supervision/Verbal cueing   Problem: RH Tub/Shower Transfers Goal: LTG Patient will perform tub/shower transfers w/assist (OT) Description: LTG: Patient will perform tub/shower transfers with assist, with/without cues using equipment (OT) Flowsheets (Taken 06/17/2020 1510) LTG: Pt will perform tub/shower stall  transfers with assistance level of: Supervision/Verbal cueing

## 2020-06-17 NOTE — Evaluation (Signed)
Physical Therapy Assessment and Plan  Patient Details  Name: Jessica Frazier MRN: [id number] Date of Birth: 2000-09-24  PT Diagnosis: Difficulty walking and Muscle weakness Rehab Potential: Good ELOS: 10-14 days   Today's Date: 06/17/2020 PT Individual Time: 1300-1413 PT Individual Time Calculation (min): 73 min    Hospital Problem: Active Problems:   TBI (traumatic brain injury) [facility])   Past Medical History:  Past Medical History:  Diagnosis Date  . Bell's palsy   . Hypothyroidism   . Rheumatoid arthritis ([place])    Past Surgical History: History reviewed. No pertinent surgical history.  Assessment & Plan Clinical Impression: Patient is a 20 y.o.female with history of seronegative RA, Bell's palsy, hypothyroid, abnormal LFTs felt to be due Rinvoq who was admitted on 06/02/20 with poor po intake, muscle wasting, epigastric pain and BLE edema. She was found to have black stools with pancytopenia.She sustained a fall in ED and was found to have multifocal acute SAH predominantly over parietal lobes. She was also found to have severe fatty liver with steatohepatitis, coagulopathy with INR 2.6 and FFP was ordered but discontinued due to fever. Dr. Arnoldo Morale was consulted for input and recommended monitoring with serial CT of head as patient was neurologically stable. Dr. Irene Limbo recommended continuing vitamin K for 2 weeks, to monitor PT/INR/Fibrinogen, transfuse platelets if less than 50, INR 1.5 or fibrinogen <200 as well as taper steroids over 2 weeks.  Bone biopsy was done for work-up and revealed pancytopenia with normocellular bone marrow with trilineage hematopoiesis including megakaryocytes--questionedto be secondary to RA/liver disease. She did have progression of ICH with increase in size, edema and mass-effect with enlargement of large left parietal scalp hematoma. She was not felt to be a surgical candidate and medical management recommended as she was neurologically  stable. Dr. Dagar/psychiatry was consulted for input due to concerns of depression and anorexia but felt that weight loss was due to diarrhea and vomiting rather than psychiatric disorder.Dr. Hung/GIrecommended I need to work-up his LFTs remain persistently elevated and she underwent ultrasound-guided biopsy of liver by Dr. Earleen Newport on 03/31.She developed fever with lactic acidosis on 04/02 --question sepsis and treated with fluids and antibiotics briefly with improvement. Antibiotics d/c 04/03 and she has been for the past 24 hours. She continues to be limited by abdominal pain with weakness, balance deficits as well as delayed processing.  Patient transferred to CIR on 06/16/2020 .   Patient currently requires min A with mobility secondary to muscle weakness, decreased cardiorespiratoy endurance, decreased motor planning, ,, decreased initiation and delayed processing and decreased standing balance and decreased balance strategies.  Prior to hospitalization, patient was independent  with mobility and lived with Family (Parents) in a House home.  Home access is 1Stairs to enter.  Patient will benefit from skilled PT intervention to maximize safe functional mobility, minimize fall risk and decrease caregiver burden for planned discharge home with 24 hour assist.  Anticipate patient will benefit from follow up [facility] at discharge.  PT - End of Session Activity Tolerance: Tolerates 30+ min activity with multiple rests Endurance Deficit: Yes Endurance Deficit Description: requires time to complete all functional tasks, very limited by fatigue, some more limitations from generalized weakness. PT Assessment Rehab Potential (ACUTE/IP ONLY): Good PT Barriers to Discharge: Inaccessible home environment;Decreased caregiver support;Home environment access/layout;Incontinence;Neurogenic Bowel & Bladder;Insurance for SNF coverage;Weight;Medication compliance PT Barriers to Discharge Comments: potential for further  decline in health, pt very motivated to return to grosly independent level PT Patient demonstrates impairments in the  following area(s): Balance;Endurance;Motor;Nutrition;Pain;Safety;Sensory PT Transfers Functional Problem(s): Bed Mobility;Bed to Chair;Car;Furniture PT Locomotion Functional Problem(s): Ambulation;Stairs PT Plan PT Intensity: Minimum of 1-2 x/day ,45 to 90 minutes PT Frequency: 5 out of 7 days PT Duration Estimated Length of Stay: 10-14 days PT Treatment/Interventions: Ambulation/gait training;Balance/vestibular training;Cognitive remediation/compensation;Community reintegration;Discharge planning;Disease management/prevention;DME/adaptive equipment instruction;Functional mobility training;Neuromuscular re-education;Pain management;Patient/family education;Psychosocial support;Skin care/wound management;Splinting/orthotics;Stair training;Therapeutic Activities;Therapeutic Exercise;UE/LE Strength taining/ROM;UE/LE Coordination activities;Visual/perceptual remediation/compensation;Wheelchair propulsion/positioning PT Transfers Anticipated Outcome(s): Overall Supervision/ Mod I PT Locomotion Anticipated Outcome(s): Overall Supervision/ independent with no AD PT Recommendation Recommendations for Other Services: Therapeutic Recreation consult Therapeutic Recreation Interventions: Stress management;Outing/community reintergration Follow Up Recommendations: Home health PT Patient destination: Home Equipment Recommended: To be determined Equipment Details: TBD   PT Evaluation Precautions/Restrictions Precautions Precautions: Fall Restrictions Weight Bearing Restrictions: No General   Vital Signs Pain Pain Assessment Pain Scale: 0-10 Pain Score: 0-No pain Home Living/Prior Functioning Home Living Available Help at Discharge: Family;Available 24 hours/day Type of Home: House Home Access: Stairs to enter [employer] of Steps: 1 Entrance Stairs-Rails:  None Bathroom Shower/Tub: Government social research officer Accessibility: Yes  Lives With: Family (Parents) Prior Function Level of Independence: Independent with basic ADLs;Independent with homemaking with ambulation;Independent with gait;Independent with transfers (Pt related (I) level, however, this info differs from previously related info) Bath: Moderate Dressing: Moderate Driving:  (need to verify) Vision/Perception  Perception Perception: Within Functional Limits Praxis Praxis: Intact  Cognition Overall Cognitive Status: Within Functional Limits for tasks assessed Arousal/Alertness: Awake/alert Orientation Level: Oriented X4 Sensation Sensation Light Touch: Appears Intact Hot/Cold: Appears Intact Proprioception: Appears Intact Stereognosis: Appears Intact Additional Comments: Reports tingling in B feet, is able to appreciate light touch to  B toes. Coordination Gross Motor Movements are Fluid and Coordinated: No (slow to initiate and perform) Fine Motor Movements are Fluid and Coordinated:  (slow to initiate and complete) Heel Shin Test: slow but able to perform Motor  Motor Motor: Other (comment) Motor - Skilled Clinical Observations: slow to initiate and perform - most likely 2/2 generalized weakness   Trunk/Postural Assessment  Cervical Assessment Cervical Assessment: Exceptions to [facility] (forward head, consistently hanging head down) Thoracic Assessment Thoracic Assessment:  (forward rounded shoulders) Lumbar Assessment Lumbar Assessment: Exceptions to [facility] (posterior pelvic tilt/ sacral sitting) Postural Control Postural Control: Deficits on evaluation (sacral sitting until provided with vc)  Balance Balance Balance Assessed: Yes Standardized Balance Assessment Standardized Balance Assessment: Berg Balance Test Berg Balance Test Sit to Stand: Needs minimal aid to stand or to stabilize Standing Unsupported: Able to stand 2 minutes with  supervision Sitting with Back Unsupported but Feet Supported on Floor or Stool: Able to sit 2 minutes under supervision Stand to Sit: Uses backs of legs against chair to control descent Transfers: Able to transfer with verbal cueing and /or supervision Standing Unsupported with Eyes Closed: Able to stand 3 seconds Standing Ubsupported with Feet Together: Needs help to attain position but able to stand for 30 seconds with feet together From Standing, Reach Forward with Outstretched Arm: Can reach forward >5 cm safely (2") From Standing Position, Pick up Object from Floor: Unable to pick up and needs supervision From Standing Position, Turn to Look Behind Over each Shoulder: Turn sideways only but maintains balance Turn 360 Degrees: Able to turn 360 degrees safely but slowly Standing Unsupported, Alternately Place Feet on Step/Stool: Able to complete >2 steps/needs minimal assist Standing Unsupported, One Foot in Front: Needs help to step but can hold 15 seconds Standing on One Leg: Unable to try  or needs assist to prevent fall Total Score: 23 Static Sitting Balance Static Sitting - Balance Support: Bilateral upper extremity supported;Feet supported Static Sitting - Level of Assistance: 5: Stand by assistance Dynamic Sitting Balance Dynamic Sitting - Balance Support: Bilateral upper extremity supported;Feet unsupported;Feet supported;During functional activity Dynamic Sitting - Level of Assistance: 5: Stand by assistance Sitting balance - Comments: able to maintain with close supervision Static Standing Balance Static Standing - Balance Support: Bilateral upper extremity supported;During functional activity Static Standing - Level of Assistance: 5: Stand by assistance Dynamic Standing Balance Dynamic Standing - Balance Support: Bilateral upper extremity supported;During functional activity Dynamic Standing - Level of Assistance: 4: Min assist;5: Stand by assistance Dynamic Standing - Balance  Activities: Lateral lean/weight shifting;Forward lean/weight shifting;Reaching for objects;Reaching for weighted objects;Reaching across midline Extremity Assessment  RUE Assessment RUE Assessment: Exceptions to [facility] Active Range of Motion (AROM) Comments: ~100 degrees shoulder flexion General Strength Comments: 3+/5 in shoulder flexion, weak grip -> req assist to open shampoo bottle LUE Assessment LUE Assessment: Exceptions to [facility] Active Range of Motion (AROM) Comments: ~100 degrees shoulder flexion General Strength Comments: 3+/5 in shoulder flexion, weak grip -> req assist to open shampoo bottle RLE Assessment RLE Assessment: Exceptions to [facility] General Strength Comments: RLE grossly 3+/ 5  except for knee extension 4-/ 5 LLE Assessment LLE Assessment: Exceptions to [facility] General Strength Comments: RLE grossly 3+/ 5  except for knee extension 4-/ 5  Care Tool Care Tool Bed Mobility Roll left and right activity   Roll left and right assist level: Minimal Assistance - Patient > 75%    Sit to lying activity   Sit to lying assist level: Minimal Assistance - Patient > 75%    Lying to sitting edge of bed activity   Lying to sitting edge of bed assist level: Minimal Assistance - Patient > 75%     Care Tool Transfers Sit to stand transfer   Sit to stand assist level: Minimal Assistance - Patient > 75%    Chair/bed transfer   Chair/bed transfer assist level: Contact Guard/Touching assist     Toilet transfer   Assist Level: Minimal Assistance - Patient > 75% Assistive Device Comment: walker  Geneticist, molecular transfer assist level: Minimal Assistance - Patient > 75%      Care Tool Locomotion Ambulation   Assist level: Minimal Assistance - Patient > 75% Assistive device: No Device Max distance: 110 feet  Walk 10 feet activity   Assist level: Contact Guard/Touching assist Assistive device: Walker-rolling   Walk 50 feet with 2 turns activity   Assist level: Minimal Assistance -  Patient > 75% Assistive device: No Device  Walk 150 feet activity Walk 150 feet activity did not occur: Safety/medical concerns      Walk 10 feet on uneven surfaces activity Walk 10 feet on uneven surfaces activity did not occur: Safety/medical concerns      Stairs   Assist level: Contact Guard/Touching assist Stairs assistive device: 2 hand rails Max number of stairs: 1  Walk up/down 1 step activity   Walk up/down 1 step (curb) assist level: Contact Guard/Touching assist Walk up/down 1 step or curb assistive device: 2 hand rails Walk up/down 4 steps activity did not occuR: Safety/medical concerns  Walk up/down 4 steps activity      Walk up/down 12 steps activity Walk up/down 12 steps activity did not occur: Safety/medical concerns      Pick up small objects from floor Pick up small object  from the floor (from standing position) activity did not occur: Safety/medical concerns      Wheelchair Will patient use wheelchair at discharge?: No   Wheelchair activity did not occur: N/A      Wheel 50 feet with 2 turns activity Wheelchair 50 feet with 2 turns activity did not occur: N/A    Wheel 150 feet activity Wheelchair 150 feet activity did not occur: N/A      Refer to Care Plan for [place] 1    Recommendations for other services: Neuropsych and Therapeutic Recreation  Stress management and Outing/community reintegration  Skilled Therapeutic Intervention Mobility Bed Mobility Bed Mobility: Rolling Right;Supine to Sit;Sit to Supine Rolling Right: Contact Guard/Touching assist Supine to Sit: Minimal Assistance - Patient > 75% Sit to Supine: Minimal Assistance - Patient > 75% Transfers Transfers: Sit to Stand;Stand to Sit;Stand Pivot Transfers Sit to Stand: Contact Guard/Touching assist;Minimal Assistance - Patient > 75% Stand to Sit: Contact Guard/Touching assist Stand Pivot Transfers: Minimal Assistance - Patient > 75% Stand Pivot Transfer  Details: Tactile cues for weight shifting;Tactile cues for posture;Tactile cues for weight beaing;Verbal cues for precautions/safety;Verbal cues for gait pattern;Verbal cues for safe use of DME/AE Transfer (Assistive device): Rolling walker (also performed without AD) Locomotion  Gait Ambulation: Yes Gait Assistance: Minimal Assistance - Patient > 75% Gait Distance (Feet): 110 Feet Assistive device: None Gait Assistance Details: Tactile cues for placement;Verbal cues for precautions/safety;Verbal cues for gait pattern;Verbal cues for safe use of DME/AE Gait Assistance Details: intermittent Min A for fatigue/ weakness and increase in sway to R Gait Gait: Yes Gait Pattern: Impaired Gait Pattern: Decreased step length - left;Decreased step length - right;Decreased stance time - right;Decreased stance time - left;Decreased hip/knee flexion - right;Decreased hip/knee flexion - left;Narrow base of support Gait velocity: decreased Stairs / Additional Locomotion Stairs: Yes Stairs Assistance: Minimal Assistance - Patient > 75%;Contact Guard/Touching assist Stair Management Technique: Two rails;Step to pattern Number of Stairs: 1 Height of Stairs: 6 Wheelchair Mobility Wheelchair Mobility: No  Therapy Session:  Patient supine in bed upon PT arrival. Patient alert and agreeable to PT session. Patient denied pain during session. Provided with 16x16" w/c for ease of transport for therapy sessions.   Therapeutic Activity: Bed Mobility: Patient performed supine --> sit first questioning how to perform. Pt informed just to perform as she would normally perform. Brings BLE to EOB, then uses bed rail to pull with UE with Min A d/t weakness in abdominals. Provided verbal cues for effort. At end of session, bed raised to pt stated home bed height and pt able to scoot posteriorly requiring Min A to bring BLE to bed surface. Light Min A for final positioning. Transfers: Patient performed STS, SPVT, and  toilet transfers with CGA/ Min A and definite need of BUE for push-to-stand. Quad weakness noted. Provided verbal cues for technique/ safety.  Gait Training:  Pt guided in short distance amb in room using RW with CGA for balance/ weakness. Pt states desire to attempt ambulation with no AD. Amb bout in hall using no AD and able to reach 110' x1 requesting seated rest break d/t fatigue/ weakness. Pt ambulated with very slow and almost hesitant reciprocal step through gait pattern. Intermittent minor LOB to pt's R side with pt using step strategy to correct and maintain balance. Provided verbal cues for straight path and level gaze as pt tends to look down and hangs head d/t weakness.  Performs one 6" step using  BHR and is able to turn on step to descend with CGA.    Neuromuscular Re-ed: NMR facilitated during session with focus on standing balance. Pt guided in [place] test with score of 23/ 56 demonstrating difficulty with more dynamic aspects of test. Sitting balance fair to good with ability to withstand up to moderate perturbations for short periods.  NMR performed for improvements in motor control and coordination, balance, sequencing, judgement, and self confidence/ efficacy in performing all aspects of mobility at highest level of independence.   Patient supine in bed at end of session with brakes locked, bed alarm set, and all needs within reach either in bed or on tray table to pt's R side.    Discharge Criteria: Patient will be discharged from PT if patient refuses treatment 3 consecutive times without medical reason, if treatment goals not met, if there is a change in medical status, if patient makes no progress towards goals or if patient is discharged from hospital.  The above assessment, treatment plan, treatment alternatives and goals were discussed and mutually agreed upon: by patient  Alger Simons PT, DPT 06/17/2020, 6:11 PM

## 2020-06-17 NOTE — Plan of Care (Signed)
  Problem: Consults Goal: RH BRAIN INJURY PATIENT EDUCATION Description: Description: See Patient Education module for eduction specifics Outcome: Progressing Goal: Nutrition Consult-if indicated Outcome: Progressing   Problem: RH SKIN INTEGRITY Goal: RH STG MAINTAIN SKIN INTEGRITY WITH ASSISTANCE Description: STG Maintain Skin Integrity With Mod I Assistance. Outcome: Progressing   Problem: RH SAFETY Goal: RH STG ADHERE TO SAFETY PRECAUTIONS W/ASSISTANCE/DEVICE Description: STG Adhere to Safety Precautions With Mod I Assistance/Device. Outcome: Progressing   Problem: RH COGNITION-NURSING Goal: RH STG ANTICIPATES NEEDS/CALLS FOR ASSIST W/ASSIST/CUES Description: STG Anticipates Needs/Calls for Assist With Cues and reminders. Outcome: Progressing   Problem: RH PAIN MANAGEMENT Goal: RH STG PAIN MANAGED AT OR BELOW PT'S PAIN GOAL Description: <3 on a 0-10 pain scale. Outcome: Progressing   Problem: RH KNOWLEDGE DEFICIT BRAIN INJURY Goal: RH STG INCREASE KNOWLEDGE OF SELF CARE AFTER BRAIN INJURY Description: Patient will be able to demonstrate medication management, dietary management, skin/wound care with educational materials and handouts provided by staff. Outcome: Progressing

## 2020-06-17 NOTE — Progress Notes (Signed)
Physical Therapy TBI Note  Patient Details  Name: Jessica Frazier MRN: [id number] Date of Birth: 07/04/2000  Today's Date: 06/17/2020 PT Individual Time: 1500-1545 PT Individual Time Calculation (min): 45 min   Short Term Goals: Week 1:  PT Short Term Goal 1 (Week 1): STG = LTG d/t ELOS  Skilled Therapeutic Interventions/Progress Updates:     Patient in bed upon PT arrival. Patient alert and agreeable to PT session. Patient denied pain during session. Reports intermittent headaches, fatigue, and trouble concentrating compared to baseline upon questioning. Provided education for TBI symptoms and symptom management. Also instructed patient to communicate any signs or symptoms different than PTA to determine additional symptoms of TBI, patient in agreement, denies any other symptoms at this time.   Therapeutic Activity: Bed Mobility: Patient performed supine to/from sit with min A for trunk support to sit up, otherwise supervision.  Transfers: Patient performed stand pivot to w/c with CGA. Reported mild "light headedness" in standing, see vitals below. She performed sit to/from stand x8 with CGA progressing to close supervision without AD. Provided verbal cues for forward weight shift, increased lateral hip muscle activation for improved knee alignment and stability, and controlled descent for safety.  Orthostatic Vitals: Sitting: BP 118/72, HR 103 (asymptomatic) Standing: BP 120/67, HR 115 (asymptomatic) Standing x3 min: BP 116/77, HR 116 (mild symptoms)  Gait Training:  6 Min Walk Test:  Instructed patient to ambulate as quickling and as safely as possible for 6 minutes using LRAD. Patient was allowed to take standing rest breaks without stopping the test, but if he required a sitting rest break the clock would be stopped and the test would be over.  Results: 176 feet with CGA-min A without AD, stopped at 3 min 35 sec due to fatigue, RPE 5/10, HR 105, asymptomatic  Patient  ambulated 15 feet forwards and backwards x2 in front of a mirror to promote increased step length, gait speed, and arm swing. She then performed alternating arm swing with visual target and rhythmic cues at 80 bpm using BITS x1 min 30 sec in standing. She then ambulated >150 feet x2 focused on exaggerated arm swing and increased gait speed without an AD with CGA and intermittent min A. Patient requested seated rest break between trials due to lower extremity fatigue and fear of falling with increased fatigue. Provided verbal cues for increased gait speed, increased arm swing, and looking ahead with visual scanning.  Patient in bed at end of session with breaks locked, bed alarm set, and all needs within reach.    Therapy Documentation Precautions:  Precautions Precautions: Fall Restrictions Weight Bearing Restrictions: No Agitated Behavior Scale: TBI Observation Details Observation Environment: CIR Start of observation period - Date: 06/17/20 Start of observation period - Time: 1500 End of observation period - Date: 06/17/20 End of observation period - Time: 1545 Agitated Behavior Scale (DO NOT LEAVE BLANKS) Short attention span, easy distractibility, inability to concentrate: Absent Impulsive, impatient, low tolerance for pain or frustration: Absent Uncooperative, resistant to care, demanding: Absent Violent and/or threatening violence toward people or property: Absent Explosive and/or unpredictable anger: Absent Rocking, rubbing, moaning, or other self-stimulating behavior: Absent Pulling at tubes, restraints, etc.: Absent Wandering from treatment areas: Absent Restlessness, pacing, excessive movement: Absent Repetitive behaviors, motor, and/or verbal: Absent Rapid, loud, or excessive talking: Absent Sudden changes of mood: Absent Easily initiated or excessive crying and/or laughter: Absent Self-abusiveness, physical and/or verbal: Absent Agitated behavior scale total score:  14    Therapy/Group: Individual Therapy  Graeson Nouri L Akoni Parton 06/17/2020, 4:35 PM

## 2020-06-17 NOTE — Patient Care Conference (Signed)
Inpatient RehabilitationTeam Conference and Plan of Care Update Date: 06/17/2020   Time: 10:10 AM    Patient Name: Jessica Frazier      Medical Record Number: [id number]  Date of Birth: 01/01/2001 Sex: Female         Room/Bed: 4W04C/4W04C-01 Payor Info: Payor: [place]  / Plan: [employer] / Product Type: *No Product type* /    Admit Date/Time:  06/16/2020  2:17 PM  Primary Diagnosis:  <principal problem not specified>  Hospital Problems: Active Problems:   TBI (traumatic brain injury) [facility])    Expected Discharge Date: Expected Discharge Date:  (9-12 days)  Team Members Present: Physician leading conference: Dr. Alger Simons Care Coodinator Present: Loralee Pacas, LCSWA;Brysan Mcevoy Creig Hines, RN, BSN, [place] Nurse Present: Dorthula Nettles, RN PT Present: Apolinar Junes, PT OT Present: Laverle Hobby, OT PPS Coordinator present : Gunnar Fusi, SLP     Current Status/Progress Goal Weekly Team Focus  Bowel/Bladder   Continent of B/B LBM 04/04  remain continent with normal bowel pattern  toilet prn   Swallow/Nutrition/ Hydration             ADL's   Eval pending         Mobility   Pending PT evaluation         Communication             Safety/Cognition/ Behavioral Observations            Pain   denies pain  pt will remain free of pain  assess pain qshift and prn   Skin   abdominal bruises bilaterally  pt free of skin breakdown or infection  assess skin qshift and prn     Discharge Planning:  To be assessed.   Team Discussion: Had a fall in the ED, cognitive seems appropriate, keeping an eye on liver function, mom is primary caregiver. Incontinent at times of urine, no complaints of pain or sleep issues. Stage 2 on the coccyx, covered with a foam dressing. Education on daily weights, nutritional supplements, and safety awareness. Barriers are eating support and help with walking. Patient on target to meet rehab goals: PT and OT evals pending.  *See  Care Plan and progress notes for long and short-term goals.   Revisions to Treatment Plan:  Not at this time.  Teaching Needs: Family education, medication management, pain management, bowel/bladder management, skin/wound care, transfer training, gait training, ADL re-training, balance training, endurance training, stair training, safety awareness.  Current Barriers to Discharge: Decreased caregiver support, Medical stability, Home enviroment access/layout, Incontinence, Wound care, Lack of/limited family support, Weight, Medication compliance, Behavior and Nutritional means  Possible Resolutions to Barriers: Continue current medications, offer nutritional support, provide emotional support.     Medical Summary Current Status: traumatic SAH after fall, bleeding disorder, liver dysfunction, seronegative RA  Barriers to Discharge: Medical stability   Possible Resolutions to [employer] Focus: daily assessment of labs, pt data. skin mgt. bp control   Continued Need for Acute Rehabilitation Level of Care: The patient requires daily medical management by a physician with specialized training in physical medicine and rehabilitation for the following reasons: Direction of a multidisciplinary physical rehabilitation program to maximize functional independence : Yes Medical management of patient stability for increased activity during participation in an intensive rehabilitation regime.: Yes Analysis of laboratory values and/or radiology reports with any subsequent need for medication adjustment and/or medical intervention. : Yes   I attest that I was present, lead the team conference, and concur with  the assessment and plan of the team.   Cristi Loron 06/17/2020, 1:58 PM

## 2020-06-18 LAB — CBC WITH DIFFERENTIAL/PLATELET
Abs Immature Granulocytes: 0.12 10*3/uL — ABNORMAL HIGH (ref 0.00–0.07)
Basophils Absolute: 0 10*3/uL (ref 0.0–0.1)
Basophils Relative: 0 %
Eosinophils Absolute: 0 10*3/uL (ref 0.0–0.5)
Eosinophils Relative: 0 %
HCT: 27.5 % — ABNORMAL LOW (ref 36.0–46.0)
Hemoglobin: 8.8 g/dL — ABNORMAL LOW (ref 12.0–15.0)
Immature Granulocytes: 3 %
Lymphocytes Relative: 21 %
Lymphs Abs: 1 10*3/uL (ref 0.7–4.0)
MCH: 31.3 pg (ref 26.0–34.0)
MCHC: 32 g/dL (ref 30.0–36.0)
MCV: 97.9 fL (ref 80.0–100.0)
Monocytes Absolute: 0.1 10*3/uL (ref 0.1–1.0)
Monocytes Relative: 3 %
Neutro Abs: 3.5 10*3/uL (ref 1.7–7.7)
Neutrophils Relative %: 73 %
Platelets: 240 10*3/uL (ref 150–400)
RBC: 2.81 MIL/uL — ABNORMAL LOW (ref 3.87–5.11)
RDW: 18.8 % — ABNORMAL HIGH (ref 11.5–15.5)
WBC: 4.8 10*3/uL (ref 4.0–10.5)
nRBC: 0 % (ref 0.0–0.2)

## 2020-06-18 LAB — APTT: aPTT: 35 seconds (ref 24–36)

## 2020-06-18 LAB — VITAMIN B6: Vitamin B6: 7.9 ug/L (ref 2.0–32.8)

## 2020-06-18 LAB — PROTIME-INR
INR: 1.2 (ref 0.8–1.2)
Prothrombin Time: 14.4 seconds (ref 11.4–15.2)

## 2020-06-18 NOTE — Progress Notes (Signed)
Inpatient Rehabilitation Care Coordinator Assessment and Plan Patient Details  Name: Jessica Frazier MRN: [id number] Date of Birth: November 06, 2000  Today's Date: 06/18/2020  Hospital Problems: Active Problems:   TBI (traumatic brain injury) [facility])  Past Medical History:  Past Medical History:  Diagnosis Date  . Bell's palsy   . Hypothyroidism   . Rheumatoid arthritis ([place])    Past Surgical History: History reviewed. No pertinent surgical history. Social History:  reports that she has never smoked. She has never used smokeless tobacco. She reports previous alcohol use. She reports that she does not use drugs.  Family / Support Systems Marital Status: Single Spouse/Significant Other: N/A Children: N/A Other Supports: parents Anticipated Caregiver: mother Ability/Limitations of Caregiver: cares for younger siblings Caregiver Availability: 24/7 Family Dynamics: Pt lives in the home with her parents, and three younger siblings (9, 27, 3)  Social History Preferred language: English Religion:  Cultural Background: Pt has not worked Education: high school Read: Yes Write: Yes Employment Status: Unemployed Public relations account executive Issues: Denies Guardian/Conservator: N/A   Abuse/Neglect Abuse/Neglect Assessment Can Be Completed: Yes Physical Abuse: Denies Verbal Abuse: Denies Sexual Abuse: Denies Exploitation of patient/patient's resources: Denies Self-Neglect: Denies  Emotional Status Pt's affect, behavior and adjustment status: Pt in good spirits at time of visit; flat affect. Recent Psychosocial Issues: Denies Psychiatric History: Denies Substance Abuse History: Denies  Patient / Family Perceptions, Expectations & Goals Pt/Family understanding of illness & functional limitations: Pt has general understanding of care needs Premorbid pt/family roles/activities: Independent Anticipated changes in roles/activities/participation: Some assistance with  ADLs/IADLs  Community Resources Express Scripts: None Premorbid Home Care/DME Agencies: None Transportation available at discharge: mother Resource referrals recommended: Neuropsychology  Discharge Planning Living Arrangements: Parent,Other relatives Support Systems: Parent,Other relatives Type of Residence: Private residence Insurance Resources: Multimedia programmer (specify) ([place]) Financial Resources: Family Support Financial Screen Referred: No Living Expenses: Medical laboratory scientific officer Management: Family Does the patient have Jessica problems obtaining your medications?: No Home Management: mother managed all home care needs (cooking, cleaning) Patient/Family Preliminary Plans: TBD Care Coordinator Barriers to Discharge: Decreased caregiver support,Lack of/limited family support Care Coordinator Anticipated Follow Up Needs: HH/OP  Clinical Impression SW met with pt in room to introduce self, explain role, and discuss discharge process. Pt is no a veteran. No DME. Pt would like a PCP.  Jessica Frazier A Jessica Frazier 06/18/2020, 3:22 PM

## 2020-06-18 NOTE — Progress Notes (Signed)
Physical Therapy TBI Note  Patient Details  Name: Jessica Frazier MRN: [id number] Date of Birth: [birth year]  Today's Date: 06/18/2020 PT Individual Time: 1302-1350 PT Individual Time Calculation (min): 48 min   Short Term Goals: Week 1:  PT Short Term Goal 1 (Week 1): STG = LTG d/t ELOS  Skilled Therapeutic Interventions/Progress Updates:   Pt received sitting in recliner and agreeable to PT. Ambulatory transfer to [facility] with supervision assist and Ue support on bed rail.   Pt transported to rehab gym in [facility]. Gait training with CGA-SUpervision assist x 4f with min cues for increased step length intermittently.   Dynamic gait training to step over 1 and 4 inch obstacles on floor. 3 x 8 with cues for increased step height on the RLE. Supervision assist fading to min assist with 4" obstacles.   dynmaic balance training to performed lateral reach to obtain bean bag then toss to target x 10 Bil with supervision assist. Pt then picked up 10 bean bags from target with CGA-min assist for safety.   Pt returned to room and performed stand pivot transfer to bed with supervision assist. Sit>supine completed without assist, and left supine in bed with call bell in reach and all needs met.          Therapy Documentation Precautions:  Precautions Precautions: Fall Restrictions Weight Bearing Restrictions: No Pain:  denies Agitated Behavior Scale: TBI   Agitated Behavior Scale (DO NOT LEAVE BLANKS) Short attention span, easy distractibility, inability to concentrate: Absent Impulsive, impatient, low tolerance for pain or frustration: Absent Uncooperative, resistant to care, demanding: Absent Violent and/or threatening violence toward people or property: Absent Explosive and/or unpredictable anger: Absent Rocking, rubbing, moaning, or other self-stimulating behavior: Absent Pulling at tubes, restraints, etc.: Absent Wandering from treatment areas: Absent Restlessness, pacing,  excessive movement: Absent Repetitive behaviors, motor, and/or verbal: Absent Rapid, loud, or excessive talking: Absent Sudden changes of mood: Absent Easily initiated or excessive crying and/or laughter: Absent Self-abusiveness, physical and/or verbal: Absent Agitated behavior scale total score: 14     Therapy/Group: Individual Therapy  ALorie Phenix4/08/2020, 2:04 PM

## 2020-06-18 NOTE — Care Management (Addendum)
Inpatient Rehabilitation Center Individual Statement of Services  Patient Name:  Jessica Frazier  Date:  06/18/2020  Welcome to the [facility].  Our goal is to provide you with an individualized program based on your diagnosis and situation, designed to meet your specific needs.  With this comprehensive rehabilitation program, you will be expected to participate in at least 3 hours of rehabilitation therapies Monday-Friday, with modified therapy programming on the weekends.  Your rehabilitation program will include the following services:  Physical Therapy (PT), Occupational Therapy (OT), 24 hour per day rehabilitation nursing, Therapeutic Recreaction (TR), Psychology, Neuropsychology, Care Coordinator, Rehabilitation Medicine, Nutrition Services, Pharmacy Services and Other  Weekly team conferences will be held on Tuesdays to discuss your progress.  Your Inpatient Rehabilitation Care Coordinator will talk with you frequently to get your input and to update you on team discussions.  Team conferences with you and your family in attendance may also be held.  Expected length of stay: 9-12 days  Overall anticipated outcome: Independent with an Assistive Device  Depending on your progress and recovery, your program may change. Your Inpatient Rehabilitation Care Coordinator will coordinate services and will keep you informed of any changes. Your Inpatient Rehabilitation Care Coordinator's name and contact numbers are listed  below.  The following services may also be recommended but are not provided by the [facility] will be made to provide these services after discharge if needed.  Arrangements include referral to agencies that provide these services.  Your insurance has been verified to be:  [place] primary doctor is:  No PCP  Pertinent information will be shared with your doctor and your insurance company.  Inpatient Rehabilitation Care Coordinator:  Cathleen Corti [phone] or ([phone]  Information discussed with and copy given to patient by: Rana Snare, 06/18/2020, 3:33 PM

## 2020-06-18 NOTE — Progress Notes (Signed)
Physical Therapy TBI Note  Patient Details  Name: Jessica Frazier MRN: [id number] Date of Birth: Jan 08, 2001  Today's Date: 06/18/2020 PT Individual Time: 1015-1130 PT Individual Time Calculation (min): 75 min   Short Term Goals: Week 1:  PT Short Term Goal 1 (Week 1): STG = LTG d/t ELOS  Skilled Therapeutic Interventions/Progress Updates:     Patient in bed upon PT arrival. Patient alert and agreeable to PT session. Patient denied pain during session. Reported persistent headache last night limiting quality of sleep, headache is resolved at this time.   Therapeutic Activity: Bed Mobility: Patient performed supine to/from sit with min A to for trunk support to sit up. Provided verbal cues for rolling to side-lying then using bottom elbow and top and to push up to sitting. Patient did require assist with this prior to admission, but reports that she would like to be able to get OOB on her own. Transfers: Patient performed sit to/from stand x9 with supervision without AD. Provided verbal cues for forward weight shift and controlled descent. Patient performed ambulatory transfer to/from the bathroom and toileting with CGA for safety. Performed peri-care and lower body clothing management independently. Patient was continent of bladder during toileting.   Gait Training:  Patient ambulated 232 feet x2 without AD with CGA and min A x2 for lateral LOB on turns. Ambulated with decreased gait speed, decreased step length and height, increased B hip and knee flexion in stance, forward trunk lean, and downward head gaze. Provided verbal cues for erect posture, looking ahead, increased arm swing for increased gait speed and improved balance during gait, and increased step length.  Neuromuscular Re-ed: Patient performed the following activities in circuit training style: -supine to/from sit 3x2 on mat table with min A as above, progressed from min A throughout sitting up to only at initiation for  coming to her elbow to push up -Five time sit to stand: Trial 1: 22.06 sec, trial 2: 23 sec, trial 3: 16 sec with supervision without AD -side stepping R/L 2x15 ft each direction with close supervision for safety and cues for technique to target glut medius throughout activity -weaving between 8 cones placed 1.5 ft apart down and back  Trial 1: 47.7 sec, hit 3 cones, min A x1  Trial 2: 47.8 sec, hit 1 cone, CGA throughout  Trial 3: 37.2 sec, hit 2 cones, CGA throughout  Trial 4: 34.2 sec, hit 1 cone, min A x1  Trial 5: 35.3 sec, hit 0 cones, CGA throughout  Patient required frequent rest breaks during session. Educated on use of modified BORG scale, limiting activity to 6/10 for energy conservation. Educated on TBI and RA management with fatigue and discussed energy conservation techniques on rest breaks.   Patient in recliner in the room at end of session with breaks locked, chair alarm set, and all needs within reach.    Therapy Documentation Precautions:  Precautions Precautions: Fall Restrictions Weight Bearing Restrictions: No Agitated Behavior Scale: TBI Observation Details Observation Environment: [facility] Start of observation period - Date: 06/18/20 Start of observation period - Time: 1015 End of observation period - Date: 06/18/20 End of observation period - Time: 1130 Agitated Behavior Scale (DO NOT LEAVE BLANKS) Short attention span, easy distractibility, inability to concentrate: Absent Impulsive, impatient, low tolerance for pain or frustration: Absent Uncooperative, resistant to care, demanding: Absent Violent and/or threatening violence toward people or property: Absent Explosive and/or unpredictable anger: Absent Rocking, rubbing, moaning, or other self-stimulating behavior: Absent Pulling at tubes, restraints,  etc.: Absent Wandering from treatment areas: Absent Restlessness, pacing, excessive movement: Absent Repetitive behaviors, motor, and/or verbal:  Absent Rapid, loud, or excessive talking: Absent Sudden changes of mood: Absent Easily initiated or excessive crying and/or laughter: Absent Self-abusiveness, physical and/or verbal: Absent Agitated behavior scale total score: 14    Therapy/Group: Individual Therapy  Zian Mohamed L Lynzie Cliburn PT, DPT  06/18/2020, 3:16 PM

## 2020-06-18 NOTE — Progress Notes (Signed)
Occupational Therapy TBI Note  Patient Details  Name: Jessica Frazier MRN: [id number] Date of Birth: 2000-12-03  Today's Date: 06/18/2020 OT Individual Time: 1400-1500 OT Individual Time Calculation (min): 60 min    Short Term Goals: Week 1:  OT Short Term Goal 1 (Week 1): NA 2/2 ELOS  Skilled Therapeutic Interventions/Progress Updates:    Pt received in bathroom with NT in room. OT supervises toilteing. Pt avle to complete with S-CGA for all components. Pt grooms for hand hygeine and oral care at sink. Pt completes ambulation with no AD in hallway with CGA and VC for forward gaze/arm swing. Pt stands at Wii fit board for 5 balance/weight shifting games to improve functional balance required for BADLs/functional mobility. Pt completes obstacle course tapping cones, weaving though cones, stepping over objects and up/down from a 6" step with CGA to work on dynamic balance to simulate home environment of small pet running around and close quarters environment 2x with setaed rest break in between. Exited session with pt seated in recliner, exit alarm on and call light in reach    Therapy Documentation Precautions:  Precautions Precautions: Fall Restrictions Weight Bearing Restrictions: No General:   Vital Signs:   Pain: Pain Assessment Pain Scale: 0-10 Pain Score: 0-No pain Pain Type: Acute pain Pain Location: Head Pain Descriptors / Indicators: Aching Pain Frequency: Constant Pain Onset: On-going Agitated Behavior Scale: TBI  Observation Details Observation Environment: [facility] Start of observation period - Date: 06/18/20 Start of observation period - Time: 1400 End of observation period - Date: 06/18/20 End of observation period - Time: 1500 Agitated Behavior Scale (DO NOT LEAVE BLANKS) Short attention span, easy distractibility, inability to concentrate: Absent Impulsive, impatient, low tolerance for pain or frustration: Absent Uncooperative, resistant to care,  demanding: Absent Violent and/or threatening violence toward people or property: Absent Explosive and/or unpredictable anger: Absent Rocking, rubbing, moaning, or other self-stimulating behavior: Absent Pulling at tubes, restraints, etc.: Absent Wandering from treatment areas: Absent Restlessness, pacing, excessive movement: Absent Repetitive behaviors, motor, and/or verbal: Absent Rapid, loud, or excessive talking: Absent Sudden changes of mood: Absent Easily initiated or excessive crying and/or laughter: Absent Self-abusiveness, physical and/or verbal: Absent Agitated behavior scale total score: 14  ADL: ADL Eating: Set up Where Assessed-Eating: Bed level Grooming: Setup Where Assessed-Grooming: Sitting at sink Upper Body Bathing: Supervision/safety Where Assessed-Upper Body Bathing: Shower Lower Body Bathing: Moderate assistance Where Assessed-Lower Body Bathing: Shower Upper Body Dressing: Supervision/safety Where Assessed-Upper Body Dressing: Chair Lower Body Dressing: Maximal assistance Where Assessed-Lower Body Dressing: Chair Toileting: Contact guard Where Assessed-Toileting: Glass blower/designer: Therapist, music Method: Counselling psychologist: Raised toilet seat,Grab bars Social research officer, government: Curator Method: Heritage manager: Civil engineer, contracting with back Glass blower/designer   Exercises:   Other Treatments:     Therapy/Group: Individual Therapy  Tonny Branch 06/18/2020, 12:53 PM

## 2020-06-18 NOTE — Progress Notes (Deleted)
Inpatient Rehabilitation Care Coordinator Assessment and Plan Patient Details  Name: Jessica Frazier MRN: [id number] Date of Birth: Aug 07, 2000  Today's Date: 06/18/2020  Hospital Problems: Active Problems:   TBI (traumatic brain injury) [facility])  Past Medical History:  Past Medical History:  Diagnosis Date  . Bell's palsy   . Hypothyroidism   . Rheumatoid arthritis ([place])    Past Surgical History: History reviewed. No pertinent surgical history. Social History:  reports that she has never smoked. She has never used smokeless tobacco. She reports previous alcohol use. She reports that she does not use drugs.  Family / Support Systems Marital Status: Single Spouse/Significant Other: N/A Children: N/A Other Supports: parents Anticipated Caregiver: mother Ability/Limitations of Caregiver: cares for younger siblings Caregiver Availability: 24/7 Family Dynamics: Pt lives in the home with her parents, and three younger siblings (19, 74, 3)  Social History Preferred language: English Religion:  Cultural Background: Pt has not worked Education: high school Read: Yes Write: Yes Employment Status: Unemployed Public relations account executive Issues: Denies Guardian/Conservator: N/A   Abuse/Neglect Abuse/Neglect Assessment Can Be Completed: Yes Physical Abuse: Denies Verbal Abuse: Denies Sexual Abuse: Denies Exploitation of patient/patient's resources: Denies Self-Neglect: Denies  Emotional Status Pt's affect, behavior and adjustment status: Pt in good spirits at time of visit; flat affect. Recent Psychosocial Issues: Denies Psychiatric History: Denies Substance Abuse History: Denies  Patient / Family Perceptions, Expectations & Goals Pt/Family understanding of illness & functional limitations: Pt has general understanding of care needs Premorbid pt/family roles/activities: Independent Anticipated changes in roles/activities/participation: Some assistance with  ADLs/IADLs  Community Resources Express Scripts: None Premorbid Home Care/DME Agencies: None Transportation available at discharge: mother Resource referrals recommended: Neuropsychology  Discharge Planning Living Arrangements: Parent,Other relatives Support Systems: Parent,Other relatives Type of Residence: Private residence Insurance Resources: Teacher, adult education Resources: Family Support Financial Screen Referred: No Living Expenses: Medical laboratory scientific officer Management: Family Does the patient have any problems obtaining your medications?: No Home Management: mother managed all home care needs (cooking, cleaning) Patient/Family Preliminary Plans: TBD Care Coordinator Barriers to Discharge: Decreased caregiver support,Lack of/limited family support Care Coordinator Anticipated Follow Up Needs: HH/OP  Clinical Impression SW met with pt in room to introduce self, explain role, and discuss discharge process. Pt is no a veteran. No DME. Pt would like a PCP.  Henryk Ursin A Melo Stauber 06/18/2020, 3:17 PM

## 2020-06-18 NOTE — Progress Notes (Signed)
PROGRESS NOTE   Subjective/Complaints: No problems last night. Up with therapy, tolerated well  ROS: Patient denies fever, rash, sore throat, blurred vision, nausea, vomiting, diarrhea, cough, shortness of breath or chest pain, joint or back pain, headache, or mood change.   Objective:   No results found. Recent Labs    06/17/20 0011 06/18/20 0619  WBC 4.9 4.8  HGB 8.5* 8.8*  HCT 26.9* 27.5*  PLT 246 240   Recent Labs    06/17/20 0011 06/17/20 0912  NA 133* 136  K 3.3* 3.7  CL 103 104  CO2 26 25  GLUCOSE 105* 85  BUN 8 10  CREATININE 0.37* 0.35*  CALCIUM 8.2* 8.5*    Intake/Output Summary (Last 24 hours) at 06/18/2020 0849 Last data filed at 06/17/2020 1830 Gross per 24 hour  Intake 597 ml  Output --  Net 597 ml     Pressure Injury 06/16/20 Coccyx Medial Stage 2 -  Partial thickness loss of dermis presenting as a shallow open injury with a red, pink wound bed without slough. small area with broken skin (Active)  06/16/20 1530  Location: Coccyx  Location Orientation: Medial  Staging: Stage 2 -  Partial thickness loss of dermis presenting as a shallow open injury with a red, pink wound bed without slough.  Wound Description (Comments): small area with broken skin  Present on Admission: Yes    Physical Exam: Vital Signs Blood pressure 117/73, pulse 89, temperature 97.8 F (36.6 C), temperature source Oral, resp. rate 16, height 5\' 2"  (1.575 m), weight 45 kg, SpO2 100 %.  Constitutional: No distress . Vital signs reviewed. HEENT: EOMI, oral membranes moist Neck: supple Cardiovascular: RRR without murmur. No JVD    Respiratory/Chest: CTA Bilaterally without wheezes or rales. Normal effort    GI/Abdomen: BS +, non-tender, non-distended Ext: no clubbing, cyanosis, or edema Psych: pleasant and cooperative Skin: small coccygeal tear Neuro: Reasonable insight, memory, and awareness. Cranial nerves 2-12 are  intact. Sensory exam is normal. Reflexes are 1+ in all 4's. Fine motor coordination is intact. No tremors. Motor function is grossly 4/5 in all 4's--no changes.  Musculoskeletal: RA deformities in fingers remain present     Assessment/Plan: 1. Functional deficits which require 3+ hours per day of interdisciplinary therapy in a comprehensive inpatient rehab setting.  Physiatrist is providing close team supervision and 24 hour management of active medical problems listed below.  Physiatrist and rehab team continue to assess barriers to discharge/monitor patient progress toward functional and medical goals  Care Tool:  Bathing    Body parts bathed by patient: Right arm,Left arm,Abdomen,Front perineal area,Chest,Buttocks,Right upper leg,Left upper leg,Face     Body parts n/a: Left lower leg,Right lower leg   Bathing assist Assist Level: Minimal Assistance - Patient > 75%     Upper Body Dressing/Undressing Upper body dressing   What is the patient wearing?: Hospital gown only,Pull over shirt    Upper body assist Assist Level: Supervision/Verbal cueing    Lower Body Dressing/Undressing Lower body dressing      What is the patient wearing?: Hospital gown only,Underwear/pull up,Pants     Lower body assist Assist for lower body dressing: Maximal Assistance -  Patient 25 - 49%     Risk analyst Assist for toileting: Independent Assistive Device Comment: walker   Transfers Chair/bed transfer  Transfers assist     Chair/bed transfer assist level: Contact Guard/Touching assist     Locomotion Ambulation   Ambulation assist      Assist level: Minimal Assistance - Patient > 75% Assistive device: No Device Max distance: 110 feet   Walk 10 feet activity   Assist     Assist level: Contact Guard/Touching assist Assistive device: Walker-rolling   Walk 50 feet activity   Assist    Assist level: Minimal Assistance - Patient >  75% Assistive device: No Device    Walk 150 feet activity   Assist Walk 150 feet activity did not occur: Safety/medical concerns         Walk 10 feet on uneven surface  activity   Assist Walk 10 feet on uneven surfaces activity did not occur: Safety/medical concerns         Wheelchair     Assist Will patient use wheelchair at discharge?: No   Wheelchair activity did not occur: N/A         Wheelchair 50 feet with 2 turns activity    Assist    Wheelchair 50 feet with 2 turns activity did not occur: N/A       Wheelchair 150 feet activity     Assist  Wheelchair 150 feet activity did not occur: N/A       Blood pressure 117/73, pulse 89, temperature 97.8 F (36.6 C), temperature source Oral, resp. rate 16, height 5\' 2"  (1.575 m), weight 45 kg, SpO2 100 %.  Medical Problem List and Plan: 1.   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    --Continue CIR therapies including PT, OT  2. Antithrombotics: -DVT/anticoagulation:Mechanical:Sequential compression devices, below kneeBilateral lower extremities, INR 1.2, APTT 35 -vitamin k thru 4/11 -antiplatelet therapy: N/A 3. Pain Management:Tramadol prn. Denies abdominal pain today. 4. Mood:LCSW to follow for evaluation and support. -antipsychotic agents: N/A 5. Neuropsych: This patientiscapable of making decisions onherown behalf. 6. Skin/Wound Care:Routine pressure relief measures. 7. Fluids/Electrolytes/Nutrition:encourage appropriate PO 8. TBI with SAH/IPH:  9. Anemia: Monitor H/H with serial checks.  --no gross blood clinically.  ----H/H variable, 8.5 4/5--8.8 4/6 10. Severe fatty liver w/Abnormal LFTs and coagulopathy:Continue  vitamin K thorough 06/23/20.  --to transfuse prn Hgb<8 or symptomatic  --Liver biopsy 03/31 to rule of AIH (ANA+ and IgG elevated)   11. Seronegative RA/Lupus?: On prednisone 40 mg day-->to continue to follow-up with rheumatology? 12.Thrombocytopenia: Has resolved. --Monitor and transfuse prn Plt<50 13. Adjustement disorder/depression?: On Remeron At bedtime.  14.Sick euthyroid: Continue Synthroid supplement. 15. Hypokalemia:Likely due to steroids and poor intake. --3.7 4/5, continue supplement.    LOS: 2 days A FACE TO FACE EVALUATION WAS PERFORMED  Meredith Staggers 06/18/2020, 8:49 AM

## 2020-06-18 NOTE — Progress Notes (Signed)
Patient ID: Jessica Frazier, female   DOB: [birth year], 20 y.o.   MRN: [id number]  SW called pt mother [name] [phone]) with [facility] [name] ID #[id number] [phone]); she left message, SW waiting on follow-up.   Loralee Pacas, MSW, [place] Office: [phone] Cell: [phone] Fax: [phone]

## 2020-06-19 LAB — CBC WITH DIFFERENTIAL/PLATELET
Abs Immature Granulocytes: 0.13 10*3/uL — ABNORMAL HIGH (ref 0.00–0.07)
Basophils Absolute: 0 10*3/uL (ref 0.0–0.1)
Basophils Relative: 1 %
Eosinophils Absolute: 0 10*3/uL (ref 0.0–0.5)
Eosinophils Relative: 1 %
HCT: 28 % — ABNORMAL LOW (ref 36.0–46.0)
Hemoglobin: 9 g/dL — ABNORMAL LOW (ref 12.0–15.0)
Immature Granulocytes: 3 %
Lymphocytes Relative: 30 %
Lymphs Abs: 1.2 10*3/uL (ref 0.7–4.0)
MCH: 31.3 pg (ref 26.0–34.0)
MCHC: 32.1 g/dL (ref 30.0–36.0)
MCV: 97.2 fL (ref 80.0–100.0)
Monocytes Absolute: 0.2 10*3/uL (ref 0.1–1.0)
Monocytes Relative: 4 %
Neutro Abs: 2.4 10*3/uL (ref 1.7–7.7)
Neutrophils Relative %: 61 %
Platelets: 273 10*3/uL (ref 150–400)
RBC: 2.88 MIL/uL — ABNORMAL LOW (ref 3.87–5.11)
RDW: 18.9 % — ABNORMAL HIGH (ref 11.5–15.5)
WBC: 3.9 10*3/uL — ABNORMAL LOW (ref 4.0–10.5)
nRBC: 0 % (ref 0.0–0.2)

## 2020-06-19 LAB — CULTURE, BLOOD (ROUTINE X 2)
Culture: NO GROWTH
Culture: NO GROWTH

## 2020-06-19 LAB — PROTIME-INR
INR: 1.1 (ref 0.8–1.2)
Prothrombin Time: 14.1 seconds (ref 11.4–15.2)

## 2020-06-19 LAB — APTT: aPTT: 35 seconds (ref 24–36)

## 2020-06-19 MED ORDER — DEXTROSE 50 % IV SOLN
INTRAVENOUS | Status: AC
  Filled 2020-06-19: qty 50

## 2020-06-19 MED ORDER — CHLORHEXIDINE GLUCONATE 0.12 % MT SOLN
OROMUCOSAL | Status: AC
  Filled 2020-06-19: qty 15

## 2020-06-19 NOTE — Progress Notes (Signed)
Occupational Therapy TBI Note  Patient Details  Name: Jessica Frazier MRN: [id number] Date of Birth: October 16, 2000  Today's Date: 06/19/2020 OT Individual Time: 7673-4193 OT Individual Time Calculation (min): 27 min    Short Term Goals: Week 1:  OT Short Term Goal 1 (Week 1): NA 2/2 ELOS  Skilled Therapeutic Interventions/Progress Updates:    Pt completed functional mobility from her room to the therapy gym with min guard assist without use of an assistive device.  Once in the gym had her work on balance reactions with use of the Biodex.  She completed two intervals of Limits of Stability with scores of 31% and 35%.  She also completed Random Control program for 2 mins at 50% accuracy.  Finished session with completion of functional mobility back to her room.  She was oriented to place and situation and also could tell therapist her room number while using external cues to locate it with only min questioning cueing.  Safety alarm in place with call button and phone in reach.    Therapy Documentation Precautions:  Precautions Precautions: Fall Restrictions Weight Bearing Restrictions: No  Pain: Pain Assessment Pain Scale: Faces Faces Pain Scale: Hurts a little bit Pain Type: Acute pain Pain Location: Leg Pain Orientation: Right;Left Pain Descriptors / Indicators: Discomfort Pain Onset: With Activity Pain Intervention(s): Repositioned Agitated Behavior Scale: TBI Observation Details Observation Environment: CIR Start of observation period - Date: 06/19/20 Start of observation period - Time: 1505 End of observation period - Date: 06/19/20 End of observation period - Time: 1535 Agitated Behavior Scale (DO NOT LEAVE BLANKS) Short attention span, easy distractibility, inability to concentrate: Absent Impulsive, impatient, low tolerance for pain or frustration: Absent Uncooperative, resistant to care, demanding: Absent Violent and/or threatening violence toward people or  property: Absent Explosive and/or unpredictable anger: Absent Rocking, rubbing, moaning, or other self-stimulating behavior: Absent Pulling at tubes, restraints, etc.: Absent Wandering from treatment areas: Absent Restlessness, pacing, excessive movement: Absent Repetitive behaviors, motor, and/or verbal: Absent Rapid, loud, or excessive talking: Absent Sudden changes of mood: Absent Easily initiated or excessive crying and/or laughter: Absent Self-abusiveness, physical and/or verbal: Absent Agitated behavior scale total score: 14  ADL: See Care Tool Section for some details of mobility and selfcare  Therapy/Group: Individual Therapy  Lilymarie Scroggins OTR/L 06/19/2020, 5:06 PM

## 2020-06-19 NOTE — IPOC Note (Signed)
Overall Plan of Care [facility]) Patient Details Name: Jessica Frazier MRN: [id number] DOB: [birth year]  Admitting Diagnosis: TBI (traumatic brain injury) [facility])  Hospital Problems: Principal Problem:   TBI (traumatic brain injury) ([place])     Functional Problem List: Nursing Behavior,Edema,Endurance,Medication Management,Nutrition,Pain,Safety,Skin Integrity  PT Balance,Endurance,Motor,Nutrition,Pain,Safety,Sensory  OT Balance,Cognition,Endurance,Motor  SLP    TR         Basic ADL's: OT Eating,Grooming,Bathing,Dressing,Toileting     Advanced  ADL's: OT Simple Meal Preparation     Transfers: PT Bed Mobility,Bed to Chair,Car,Furniture  OT Toilet,Tub/Shower     Locomotion: PT Ambulation,Stairs     Additional Impairments: OT None  SLP        TR      Anticipated Outcomes Item Anticipated Outcome  Self Feeding ind  Swallowing      Basic self-care  mod I  Toileting  mod I   Bathroom Transfers S  Bowel/Bladder  Mod I  Transfers  Overall Supervision/ Mod I  Locomotion  Overall Supervision/ independent with no AD  Communication     Cognition     Pain  <3  Safety/Judgment  Mod I with cues and reminders   Therapy Plan: PT Intensity: Minimum of 1-2 x/day ,45 to 90 minutes PT Frequency: 5 out of 7 days PT Duration Estimated Length of Stay: 10-14 days OT Intensity: Minimum of 1-2 x/day, 45 to 90 minutes OT Frequency: Total of 15 hours over 7 days of combined therapies OT Duration/Estimated Length of Stay: 9 to 12 days     Due to the current state of emergency, patients may not be receiving their 3-hours of Medicare-mandated therapy.   Team Interventions: Nursing Interventions Patient/Family Education,Disease Management/Prevention,Pain Management,Medication Management,Skin Care/Wound Management,Discharge Planning,Psychosocial Support  PT interventions Ambulation/gait training,Balance/vestibular training,Cognitive remediation/compensation,Community  reintegration,Discharge planning,Disease management/prevention,DME/adaptive equipment instruction,Functional mobility training,Neuromuscular re-education,Pain management,Patient/family education,Psychosocial support,Skin care/wound management,Splinting/orthotics,Stair training,Therapeutic Activities,Therapeutic Exercise,UE/LE Strength taining/ROM,UE/LE Coordination activities,Visual/perceptual remediation/compensation,Wheelchair propulsion/positioning  OT Interventions Balance/vestibular training,Cognitive remediation/compensation,Community reintegration,Discharge planning,DME/adaptive equipment instruction,Disease mangement/prevention,Functional mobility training,Pain management,Neuromuscular re-education,Patient/family education,Self Care/advanced ADL retraining,Therapeutic Exercise,UE/LE Museum/gallery conservator propulsion/positioning,UE/LE Strength taining/ROM,Therapeutic Activities,Skin care/wound managment,Psychosocial support  SLP Interventions    TR Interventions    SW/CM Interventions Psychosocial Support,Patient/Family Education,Discharge Planning   Barriers to Discharge MD  Medical stability  Nursing Decreased caregiver support,Home environment access/layout,Wound Care,Lack of/limited family support,Weight,Medication compliance,Behavior,Nutrition means    PT Inaccessible home environment,Decreased caregiver support,Home environment access/layout,Incontinence,Neurogenic Bowel & Bladder,Insurance for SNF coverage,Weight,Medication compliance potential for further decline in health, pt very motivated to return to grosly independent level  OT Weight    SLP      SW Decreased caregiver support,Lack of/limited family support     Team Discharge Planning: Destination: PT-Home ,OT- Home , SLP-  Projected Follow-up: PT-Home health PT, OT-  Home health OT,Outpatient OT, SLP-  Projected Equipment Needs: PT-To be determined, OT- None recommended by OT, SLP-  Equipment Details: PT-TBD,  OT-  Patient/family involved in discharge planning: PT- Patient,  OT-Patient, SLP-   MD ELOS: 8-12 days Medical Rehab Prognosis:  Excellent Assessment: The patient has been admitted for CIR therapies with the diagnosis of TBI, GI with debility, RA. The team will be addressing functional mobility, strength, stamina, balance, safety, adaptive techniques and equipment, self-care, bowel and bladder mgt, patient and caregiver education, NMR, community reentr. Goals have been set at mod I to supervision with basic mobility and self-care tasks.   Due to the current state of emergency, patients may not be receiving their 3 hours per day of Medicare-mandated therapy.    Meredith Staggers,  MD, [facility]      See Team Conference Notes for weekly updates to the plan of care

## 2020-06-19 NOTE — Progress Notes (Signed)
Physical Therapy Session Note  Patient Details  Name: Jessica Frazier MRN: [id number] Date of Birth: 02-11-2001  Today's Date: 06/19/2020 PT Individual Time: 7276-1848 PT Individual Time Calculation (min): 30 min   Short Term Goals: Week 1:  PT Short Term Goal 1 (Week 1): STG = LTG d/t ELOS  Skilled Therapeutic Interventions/Progress Updates:     Pt received supine in bed and agrees to therapy. NO complaint of pain. Pt performs supine to sit with bed features and verbal cues for logrolling technique. Sit to stand and stand step transfer to [facility] with CGA. WC transport to gym for time management. Pt ambulates x100' with CGA and cues for upright gaze to improve posture and balance. Pt then ambulates 2x100' with 4lb ankle weights on each leg for strengthening and endurance training. Pt verbalizes need to urinate. Stand step transfer to toilet with CGA. Pt ambulates to sink to wash hands with close supervision for safety, then ambulates back to [facility] with CGA. Pt encouraged to sit up in [facility] for cardiovascular benefits and endurance. Pt left seated in WC with alarm intact and all needs within reach.  Therapy Documentation Precautions:  Precautions Precautions: Fall Restrictions Weight Bearing Restrictions: No   Therapy/Group: Individual Therapy  Breck Coons, PT, DPT 06/19/2020, 4:05 PM

## 2020-06-19 NOTE — Progress Notes (Signed)
Physical Therapy TBI Note  Patient Details  Name: Jessica Frazier MRN: [id number] Date of Birth: 2000/07/31  Today's Date: 06/19/2020 PT Individual Time: 6712-4580 PT Individual Time Calculation (min): 69 min   Short Term Goals: Week 1:  PT Short Term Goal 1 (Week 1): STG = LTG d/t ELOS  Skilled Therapeutic Interventions/Progress Updates:    Pt received sitting in recliner. Did not report pain and agreeable to therapy. Oriented x4. Transferred to standing CGA. Gait training CGA with no AD to therapy gym ~258ft with observation of slow gait speed, decreased step height, decreased step length, minimal arm swing. Superset of the following: sit to stands for increased leg strength performed CGA 3x (increasing reps from 10, 15, to 20), standing static balance with eyes closed 3x (increasing time from 30sec, 45sec, to 1 min). Pt able to perform with minimal difficulty, but rest breaks frequently given with this intervention (and throughout session) due to shortness of breath. Alternating step tapping 3x10 each leg. Verbal cuing to "look up;" pt able to perform but stepping speed decreased when pt did not look at feet. Pt reported this as "very tiring." Romberg standing for static balance CGA 1x30sec. Pt performed relatively easily with exception of isolated instance of L LOB (stepping strategy used with R foot to prevent fall), so challenge added of eyes closed and performed 2 additional times (45 sec and 1 min). Pt demonstrated increased swaying with flexed knees but able to complete entire sets (even after verbalizing she could not reach one min). Dynamic balance via lateral stepping CGA over 2 gloves for increasing stepping height. Performed 1x3 rounds and pt kicked gloves on each rep, verbalizing after "I don't think I'm doing this right." Verbal cuing to "get close to gloves before stepping and take large steps. Pt able to achieve greater success with occasional LOB, but still requiring rest breaks  after 3 rounds back and forth due to shortness of breath. Modified Borg score inquired, but pt just verbalized "really tired." Education on breathing techniques to recover in between sets. Similar activity performed with forward stepping CGA; pt still demonstrated difficulty but achieved greater success than lateral stepping. Gait training back to room ~200 ft CGA with no AD. Greater arm swing observed along with more upright posture. Pt left sitting in recliner; needs within reach and seat alarm turned on.  Therapy Documentation Precautions:  Precautions Precautions: Fall Restrictions Weight Bearing Restrictions: No Agitated Behavior Scale: TBI Observation Details Observation Environment: CIR Start of observation period - Date: 06/19/20 Start of observation period - Time: 1056 End of observation period - Date: 06/19/20 End of observation period - Time: 1205 Agitated Behavior Scale (DO NOT LEAVE BLANKS) Short attention span, easy distractibility, inability to concentrate: Absent Impulsive, impatient, low tolerance for pain or frustration: Absent Uncooperative, resistant to care, demanding: Absent Violent and/or threatening violence toward people or property: Absent Explosive and/or unpredictable anger: Absent Rocking, rubbing, moaning, or other self-stimulating behavior: Absent Pulling at tubes, restraints, etc.: Absent Wandering from treatment areas: Absent Restlessness, pacing, excessive movement: Absent Repetitive behaviors, motor, and/or verbal: Absent Rapid, loud, or excessive talking: Absent Sudden changes of mood: Absent Easily initiated or excessive crying and/or laughter: Absent Self-abusiveness, physical and/or verbal: Absent Agitated behavior scale total score: 14     Therapy/Group: Individual Therapy  Eleonore Chiquito, SPT 06/19/2020, 9:26 AM

## 2020-06-19 NOTE — Progress Notes (Signed)
No open area seen on pt head from fall. Soft lump noted. 6.0x7.0 lump measured. Sheela Stack, LPN

## 2020-06-19 NOTE — Progress Notes (Signed)
Discussed safety plan and safety precautions with pt. Explained the importance of waiting for staff assist. Explained further options to ensure safety including use of telesitter. Pt is reluctant on the use of the telesitter. Pt in agreement to wait for staff assist before transfers.Questions answered. Pt resting in bed, call light in reach, s/o at bedside.  Sheela Stack, LPN

## 2020-06-19 NOTE — Progress Notes (Signed)
Occupational Therapy Session Note  Patient Details  Name: Jessica Frazier MRN: [id number] Date of Birth: 18-Aug-2000  Today's Date: 06/19/2020 OT Individual Time: 2297-9892 OT Individual Time Calculation (min): 55 min    Short Term Goals: Week 1:  OT Short Term Goal 1 (Week 1): NA 2/2 ELOS  Skilled Therapeutic Interventions/Progress Updates:    pt supine with 3/10 HA, requesting medication- LPN alerted. Pt completed bed mobility with use of bed rails, supervision. Ambulatory transfer into the bathroom with CGA. Min cueing for energy conservation strategies, to doff clothing seated. Pt required min A to doff pants and socks. She transferred into the shower with CGA. All bathing completed sit > stand from shower chair with CGA for standing level bathing. Pt returned to EOB and donned UB clothing with assist only to untangle bra in the back. She donned underwear with CGA, requiring mod A to don pants. Hair care performed seated with mod A from OT d/t progressing fatigue. Pt ended session with 100 ft of functional mobility at CGA level to increase functional activity tolerance. Pt was left sitting up in the recliner with all needs met, chair alarm set.   Therapy Documentation Precautions:  Precautions Precautions: Fall Restrictions Weight Bearing Restrictions: No   Therapy/Group: Individual Therapy  Curtis Sites 06/19/2020, 11:09 AM

## 2020-06-19 NOTE — Progress Notes (Signed)
PROGRESS NOTE   Subjective/Complaints: Up in bed. No new issues. Denies pain. Therapy going well. Asked when she might be able to go home  ROS: Patient denies fever, rash, sore throat, blurred vision, nausea, vomiting, diarrhea, cough, shortness of breath or chest pain, joint or back pain, headache, or mood change.    Objective:   No results found. Recent Labs    06/18/20 0619 06/19/20 0617  WBC 4.8 3.9*  HGB 8.8* 9.0*  HCT 27.5* 28.0*  PLT 240 273   Recent Labs    06/17/20 0011 06/17/20 0912  NA 133* 136  K 3.3* 3.7  CL 103 104  CO2 26 25  GLUCOSE 105* 85  BUN 8 10  CREATININE 0.37* 0.35*  CALCIUM 8.2* 8.5*    Intake/Output Summary (Last 24 hours) at 06/19/2020 1029 Last data filed at 06/19/2020 0743 Gross per 24 hour  Intake 480 ml  Output --  Net 480 ml     Pressure Injury 06/16/20 Coccyx Medial Stage 2 -  Partial thickness loss of dermis presenting as a shallow open injury with a red, pink wound bed without slough. small area with broken skin (Active)  06/16/20 1530  Location: Coccyx  Location Orientation: Medial  Staging: Stage 2 -  Partial thickness loss of dermis presenting as a shallow open injury with a red, pink wound bed without slough.  Wound Description (Comments): small area with broken skin  Present on Admission: Yes    Physical Exam: Vital Signs Blood pressure 116/71, pulse 89, temperature 98.2 F (36.8 C), temperature source Oral, resp. rate 18, height 5\' 2"  (1.575 m), weight 43 kg, SpO2 98 %.  Constitutional: No distress . Vital signs reviewed. HEENT: EOMI, oral membranes moist Neck: supple Cardiovascular: RRR without murmur. No JVD    Respiratory/Chest: CTA Bilaterally without wheezes or rales. Normal effort    GI/Abdomen: BS +, non-tender, non-distended Ext: no clubbing, cyanosis, or edema Psych: pleasant and cooperative Skin: coccyx area dressed Neuro: Reasonable insight,  memory, and awareness. Cranial nerves 2-12 are intact. Sensory exam is normal. Reflexes are 1+ in all 4's. Fine motor coordination is intact. No tremors. Motor function is grossly 4/5 in all 4's.  Musculoskeletal: RA deformities in fingers remain present     Assessment/Plan: 1. Functional deficits which require 3+ hours per day of interdisciplinary therapy in a comprehensive inpatient rehab setting.  Physiatrist is providing close team supervision and 24 hour management of active medical problems listed below.  Physiatrist and rehab team continue to assess barriers to discharge/monitor patient progress toward functional and medical goals  Care Tool:  Bathing    Body parts bathed by patient: Right arm,Left arm,Abdomen,Front perineal area,Chest,Buttocks,Right upper leg,Left upper leg,Face     Body parts n/a: Left lower leg,Right lower leg   Bathing assist Assist Level: Minimal Assistance - Patient > 75%     Upper Body Dressing/Undressing Upper body dressing   What is the patient wearing?: Hospital gown only,Pull over shirt    Upper body assist Assist Level: Supervision/Verbal cueing    Lower Body Dressing/Undressing Lower body dressing      What is the patient wearing?: Pants,Underwear/pull up     Lower  body assist Assist for lower body dressing: Moderate Assistance - Patient 50 - 74%     Toileting Toileting    Toileting assist Assist for toileting: Independent Assistive Device Comment: walker   Transfers Chair/bed transfer  Transfers assist     Chair/bed transfer assist level: Contact Guard/Touching assist     Locomotion Ambulation   Ambulation assist      Assist level: Minimal Assistance - Patient > 75% Assistive device: No Device Max distance: 110 feet   Walk 10 feet activity   Assist     Assist level: Contact Guard/Touching assist Assistive device: Walker-rolling   Walk 50 feet activity   Assist    Assist level: Minimal Assistance -  Patient > 75% Assistive device: No Device    Walk 150 feet activity   Assist Walk 150 feet activity did not occur: Safety/medical concerns         Walk 10 feet on uneven surface  activity   Assist Walk 10 feet on uneven surfaces activity did not occur: Safety/medical concerns         Wheelchair     Assist Will patient use wheelchair at discharge?: No   Wheelchair activity did not occur: N/A         Wheelchair 50 feet with 2 turns activity    Assist    Wheelchair 50 feet with 2 turns activity did not occur: N/A       Wheelchair 150 feet activity     Assist  Wheelchair 150 feet activity did not occur: N/A       Blood pressure 116/71, pulse 89, temperature 98.2 F (36.8 C), temperature source Oral, resp. rate 18, height 5\' 2"  (1.575 m), weight 43 kg, SpO2 98 %.  Medical Problem List and Plan: 1.   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    --Continue CIR therapies including PT, OT  2. Antithrombotics: -DVT/anticoagulation:Mechanical:Sequential compression devices, below kneeBilateral lower extremities, INR 1.2, APTT 35 -vitamin k thru 4/11 -antiplatelet therapy: N/A 3. Pain Management:Tramadol prn. Denies abdominal pain today. 4. Mood:LCSW to follow for evaluation and support. -antipsychotic agents: N/A 5. Neuropsych: This patientiscapable of making decisions onherown behalf. 6. Skin/Wound Care:Routine pressure relief measures. 7. Fluids/Electrolytes/Nutrition:encourage appropriate PO 8. TBI with SAH/IPH:  9. Anemia: Monitor H/H with serial checks.  --no gross blood clinically.  ----H/H variable, 8.5 4/5--8.8 4/6-->9.0 4/7 10. Severe fatty liver w/Abnormal LFTs and  coagulopathy:Continue vitamin K thorough 06/23/20.  --to transfuse prn Hgb<8 or symptomatic  --Liver biopsy 03/31 to rule of AIH (ANA+ and IgG elevated)   -minimally active steatohepatitis 11. Seronegative RA/Lupus?: On prednisone 40 mg day-->to continue to follow-up with rheumatology? 12.Thrombocytopenia: Has resolved. --Monitor and transfuse prn Plt<50  4/7 counts stable 13. Adjustement disorder/depression?: On Remeron At bedtime.  14.Sick euthyroid: Continue Synthroid supplement. 15. Hypokalemia:Likely due to steroids and poor intake. --3.7 4/5, continue supplement.    LOS: 3 days A FACE TO FACE EVALUATION WAS PERFORMED  Meredith Staggers 06/19/2020, 10:29 AM

## 2020-06-20 LAB — PROTIME-INR
INR: 1.1 (ref 0.8–1.2)
Prothrombin Time: 13.4 seconds (ref 11.4–15.2)

## 2020-06-20 LAB — APTT: aPTT: 34 seconds (ref 24–36)

## 2020-06-20 NOTE — Progress Notes (Signed)
Physical Therapy Session Note  Patient Details  Name: Jessica Frazier MRN: [id number] Date of Birth: 03-Dec-2000  Today's Date: 06/20/2020 PT Group Time: 0915-1015 PT Group Time Calculation (min): 60 min  Short Term Goals: Week 1:  PT Short Term Goal 1 (Week 1): STG = LTG d/t ELOS  Skilled Therapeutic Interventions/Progress Updates:   Pt received sitting in WC and agreeable to group PT treatment  Gait training through obstacle course. To weave through 8 cones and step over 6, 1 inch bars on floor. CGA for safety with cues for improved step length with navigating over bars on floor pt reports orthostatic s/s. PT assessed VS.   Seated: 108/89 116, Standing 122/84, HR 124, only mild s/s of dizzines noted that dissipated after 30 sec    Seated dynamic balance training to toss beach ball and performed trunk rotation as well as tap ball back with 3# bar weight. Performed x 5 minutes with distant supervision assist from RT for safety.   Nustep level 4, x 5 min with instruction to take breaks as needed with steps per min >40.   Kinetron 30cm/sec x 5 minutes. Cues for improved ROM and increased speed as tolerated as well as improved posture intermittently.   Prolonged therapeutic rest break between bouts due to fatigue and to sanitize equipment for pt safety. Patient returned to room and left sitting in [facility] with call bell in reach and all needs met.         Therapy Documentation Precautions:  Precautions Precautions: Fall Restrictions Weight Bearing Restrictions: No   Pain: Pain Assessment Pain Scale: 0-10 Pain Score: 5  Pain Type: Acute pain Pain Location: Leg Pain Orientation: Right;Lower Pain Descriptors / Indicators: Discomfort Pain Onset: With Activity Patients Stated Pain Goal: 0 Pain Intervention(s): Medication (See eMAR) Multiple Pain Sites: No   Therapy/Group: Group Therapy  Lorie Phenix 06/20/2020, 1:01 PM

## 2020-06-20 NOTE — Plan of Care (Signed)
  Problem: Consults Goal: RH BRAIN INJURY PATIENT EDUCATION Description: Description: See Patient Education module for eduction specifics Outcome: Progressing Goal: Nutrition Consult-if indicated Outcome: Progressing   Problem: RH SKIN INTEGRITY Goal: RH STG MAINTAIN SKIN INTEGRITY WITH ASSISTANCE Description: STG Maintain Skin Integrity With Mod I Assistance. Outcome: Progressing   Problem: RH SAFETY Goal: RH STG ADHERE TO SAFETY PRECAUTIONS W/ASSISTANCE/DEVICE Description: STG Adhere to Safety Precautions With Mod I Assistance/Device. Outcome: Progressing   Problem: RH COGNITION-NURSING Goal: RH STG ANTICIPATES NEEDS/CALLS FOR ASSIST W/ASSIST/CUES Description: STG Anticipates Needs/Calls for Assist With Cues and reminders. Outcome: Progressing   Problem: RH PAIN MANAGEMENT Goal: RH STG PAIN MANAGED AT OR BELOW PT'S PAIN GOAL Description: <3 on a 0-10 pain scale. Outcome: Progressing   Problem: RH KNOWLEDGE DEFICIT BRAIN INJURY Goal: RH STG INCREASE KNOWLEDGE OF SELF CARE AFTER BRAIN INJURY Description: Patient will be able to demonstrate medication management, dietary management, skin/wound care with educational materials and handouts provided by staff. Outcome: Progressing

## 2020-06-20 NOTE — Progress Notes (Signed)
Occupational Therapy TBI Note  Patient Details  Name: Jessica Frazier MRN: [id number] Date of Birth: 11-Nov-2000  Today's Date: 06/20/2020 OT Individual Time: 1400-1430 OT Individual Time Calculation (min): 30 min    Short Term Goals: Week 1:  OT Short Term Goal 1 (Week 1): NA 2/2 ELOS  Skilled Therapeutic Interventions/Progress Updates:    1;1. Pt recievd in bed agreeable to OT and shower. Focus of session on beginning of ADL/self organizing/gathering of items with increased time and min question cues to recall to geher linens for shower. Pt able to self correct error with grabbing 2 shirts. Pt able to correct safety issue of standing to doff LB clothing sitting on shower chair without cuing. Pt stands for majority of shower except to wash lower legs. MOD A for dressing d/t time constraints. Recommend reacher use to improve I for doffing clothing and pciking up items. Exited session with pt seated in recliner, exit alarm on and call light in reach   Therapy Documentation Precautions:  Precautions Precautions: Fall Restrictions Weight Bearing Restrictions: No General:   Vital Signs: Therapy Vitals Temp: 98.2 F (36.8 C) Temp Source: Oral Pulse Rate: 82 Resp: 20 BP: (!) 110/55 Patient Position (if appropriate): Lying Oxygen Therapy SpO2: 99 % O2 Device: Room Air Pain:   Agitated Behavior Scale: TBI  Observation Details Observation Environment: CIR Start of observation period - Date: 06/20/20 Start of observation period - Time: 1400 End of observation period - Date: 06/20/20 End of observation period - Time: 1551 Agitated Behavior Scale (DO NOT LEAVE BLANKS) Short attention span, easy distractibility, inability to concentrate: Absent Impulsive, impatient, low tolerance for pain or frustration: Absent Uncooperative, resistant to care, demanding: Absent Violent and/or threatening violence toward people or property: Absent Explosive and/or unpredictable anger:  Absent Rocking, rubbing, moaning, or other self-stimulating behavior: Absent Pulling at tubes, restraints, etc.: Absent Wandering from treatment areas: Absent Restlessness, pacing, excessive movement: Absent Repetitive behaviors, motor, and/or verbal: Absent Rapid, loud, or excessive talking: Absent Sudden changes of mood: Absent Easily initiated or excessive crying and/or laughter: Absent Self-abusiveness, physical and/or verbal: Absent Agitated behavior scale total score: 14  ADL: ADL Eating: Set up Where Assessed-Eating: Bed level Grooming: Setup Where Assessed-Grooming: Sitting at sink Upper Body Bathing: Supervision/safety Where Assessed-Upper Body Bathing: Shower Lower Body Bathing: Moderate assistance Where Assessed-Lower Body Bathing: Shower Upper Body Dressing: Supervision/safety Where Assessed-Upper Body Dressing: Chair Lower Body Dressing: Maximal assistance Where Assessed-Lower Body Dressing: Chair Toileting: Contact guard Where Assessed-Toileting: Glass blower/designer: Therapist, music Method: Counselling psychologist: Raised toilet seat,Grab bars Social research officer, government: Curator Method: Heritage manager: Civil engineer, contracting with back Glass blower/designer   Exercises:   Other Treatments:     Therapy/Group: Individual Therapy  Tonny Branch 06/20/2020, 6:56 AM

## 2020-06-20 NOTE — Progress Notes (Signed)
PROGRESS NOTE   Subjective/Complaints: Up walking with therapy. No new complaints. Asked about her dc date. Denies any pain.   ROS: Patient denies fever, rash, sore throat, blurred vision, nausea, vomiting, diarrhea, cough, shortness of breath or chest pain, joint or back pain, headache, or mood change.   Objective:   No results found. Recent Labs    06/18/20 0619 06/19/20 0617  WBC 4.8 3.9*  HGB 8.8* 9.0*  HCT 27.5* 28.0*  PLT 240 273   No results for input(s): NA, K, CL, CO2, GLUCOSE, BUN, CREATININE, CALCIUM in the last 72 hours.  Intake/Output Summary (Last 24 hours) at 06/20/2020 1114 Last data filed at 06/20/2020 0853 Gross per 24 hour  Intake 780 ml  Output --  Net 780 ml     Pressure Injury 06/16/20 Coccyx Medial Stage 2 -  Partial thickness loss of dermis presenting as a shallow open injury with a red, pink wound bed without slough. small area with broken skin (Active)  06/16/20 1530  Location: Coccyx  Location Orientation: Medial  Staging: Stage 2 -  Partial thickness loss of dermis presenting as a shallow open injury with a red, pink wound bed without slough.  Wound Description (Comments): small area with broken skin  Present on Admission: Yes    Physical Exam: Vital Signs Blood pressure (!) 110/55, pulse 82, temperature 98.2 F (36.8 C), temperature source Oral, resp. rate 20, height 5\' 2"  (1.575 m), weight 41 kg, SpO2 99 %.  Constitutional: No distress . Vital signs reviewed. HEENT: EOMI, oral membranes moist Neck: supple Cardiovascular: RRR without murmur. No JVD    Respiratory/Chest: CTA Bilaterally without wheezes or rales. Normal effort    GI/Abdomen: BS +, non-tender, non-distended Ext: no clubbing, cyanosis, or edema Psych: pleasant and cooperative Skin: coccyx area dressed Neuro: Reasonable insight, memory, and awareness. Cranial nerves 2-12 are intact. Sensory exam is normal. Reflexes are 1+  in all 4's. Fine motor coordination is intact. No tremors. Motor function is grossly 4/5 in all 4's. Slightly wide based gait. Musculoskeletal: RA deformities in fingers remain present     Assessment/Plan: 1. Functional deficits which require 3+ hours per day of interdisciplinary therapy in a comprehensive inpatient rehab setting.  Physiatrist is providing close team supervision and 24 hour management of active medical problems listed below.  Physiatrist and rehab team continue to assess barriers to discharge/monitor patient progress toward functional and medical goals  Care Tool:  Bathing    Body parts bathed by patient: Right arm,Left arm,Abdomen,Front perineal area,Chest,Buttocks,Right upper leg,Left upper leg,Face     Body parts n/a: Left lower leg,Right lower leg   Bathing assist Assist Level: Minimal Assistance - Patient > 75%     Upper Body Dressing/Undressing Upper body dressing   What is the patient wearing?: Hospital gown only,Pull over shirt    Upper body assist Assist Level: Supervision/Verbal cueing    Lower Body Dressing/Undressing Lower body dressing      What is the patient wearing?: Pants,Underwear/pull up     Lower body assist Assist for lower body dressing: Moderate Assistance - Patient 50 - 74%     Toileting Toileting    Toileting assist Assist for  toileting: Independent Assistive Device Comment: walker   Transfers Chair/bed transfer  Transfers assist     Chair/bed transfer assist level: Contact Guard/Touching assist     Locomotion Ambulation   Ambulation assist      Assist level: Contact Guard/Touching assist Assistive device: No Device Max distance: 150'   Walk 10 feet activity   Assist     Assist level: Contact Guard/Touching assist Assistive device: No Device   Walk 50 feet activity   Assist    Assist level: Contact Guard/Touching assist Assistive device: No Device    Walk 150 feet activity   Assist Walk  150 feet activity did not occur: Safety/medical concerns  Assist level: Contact Guard/Touching assist Assistive device: No Device    Walk 10 feet on uneven surface  activity   Assist Walk 10 feet on uneven surfaces activity did not occur: Safety/medical concerns         Wheelchair     Assist Will patient use wheelchair at discharge?: No   Wheelchair activity did not occur: N/A         Wheelchair 50 feet with 2 turns activity    Assist    Wheelchair 50 feet with 2 turns activity did not occur: N/A       Wheelchair 150 feet activity     Assist  Wheelchair 150 feet activity did not occur: N/A       Blood pressure (!) 110/55, pulse 82, temperature 98.2 F (36.8 C), temperature source Oral, resp. rate 20, height 5\' 2"  (1.575 m), weight 41 kg, SpO2 99 %.  Medical Problem List and Plan: 1.   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    -Continue CIR therapies including PT, OT  2. Antithrombotics: -DVT/anticoagulation:Mechanical:Sequential compression devices, below kneeBilateral lower extremities, INR 1.1  APTT 34--can dc daily checks -vitamin k thru 4/11 -antiplatelet therapy: N/A 3. Pain Management:Tramadol prn. Denies abdominal pain today. 4. Mood:LCSW to follow for evaluation and support. -antipsychotic agents: N/A 5. Neuropsych: This patientiscapable of making decisions onherown behalf. 6. Skin/Wound Care:Routine pressure relief measures. 7. Fluids/Electrolytes/Nutrition:encourage appropriate PO 8. TBI with SAH/IPH:  9. Anemia:  .  --no gross blood clinically.  ----H/H variable, 8.5 4/5--8.8 4/6-->9.0 4/7--recheck Monday 4/11 10. Severe fatty liver w/Abnormal LFTs and  coagulopathy:Continue vitamin K thorough 06/23/20.  --to transfuse prn Hgb<8 or symptomatic  --Liver biopsy 03/31 to rule of AIH (ANA+ and IgG elevated)   -bx with minimally active steatohepatitis 11. Seronegative RA/Lupus?: On prednisone 40 mg day-->to continue to follow-up with rheumatology? 12.Thrombocytopenia: Has resolved. --Monitor and transfuse prn Plt<50  4/7 counts stable--recheck Monday 13. Adjustement disorder/depression?: On Remeron At bedtime.  14.Sick euthyroid: Continue Synthroid supplement. 15. Hypokalemia:Likely due to steroids and poor intake. --3.7 4/5, continue supplement.    LOS: 4 days A FACE TO FACE EVALUATION WAS PERFORMED  Jessica Frazier 06/20/2020, 11:14 AM

## 2020-06-20 NOTE — Progress Notes (Signed)
Physical Therapy Session Note  Patient Details  Name: Jessica Frazier MRN: [id number] Date of Birth: 09/16/2000  Today's Date: 06/20/2020 PT Individual Time: 1740-8144 PT Individual Time Calculation (min): 44 min   Short Term Goals: Week 1:  PT Short Term Goal 1 (Week 1): STG = LTG d/t ELOS  Skilled Therapeutic Interventions/Progress Updates: Pt presents sitting EOB and finishing breakfast.  Pt agreeable to therapy.  Pt states "feet feel different", but unable to state pain.  Pt amb multiple trials w/o AD up to 150', verbal cues for increased step length, arm swing and upright posture.  Pt performed quick stops/starts, stepping over, sidestepping, toe-taps to 4" platform and standing eyes-closed x 30 secs.  Pt required multiple seated rest breaks 2/2 fatigue.  Pt amb to BR at conclusion and handed off to NT.     Therapy Documentation Precautions:  Precautions Precautions: Fall Restrictions Weight Bearing Restrictions: No General:   Vital Signs: Therapy Vitals Temp: 98.2 F (36.8 C) Temp Source: Oral Pulse Rate: 82 Resp: 20 BP: (!) 110/55 Patient Position (if appropriate): Lying Oxygen Therapy SpO2: 99 % O2 Device: Room Air Pain: no c/o pain      Therapy/Group: Individual Therapy  Jessica Frazier 06/20/2020, 8:45 AM

## 2020-06-20 NOTE — Progress Notes (Addendum)
Patient ID: Jessica Frazier, female   DOB: 01/07/2001, 20 y.o.   MRN: [id number]  SW returned phone call/left message for pt mother [name] [phone]) with [facility] TC#[id number] [phone]); she left message, SW waiting on follow-up.   SW spoke with receptionist with [facility] [phone]) who will fax a new patient appointment application that must be completed and faxed back along with [facility] ID and insurance card.   SW provided pt with application for completion and SW informed will fax once completed. *Pt later called and asked for an application in [place].  SW provided pt with application in [place].  SW returned phone call to pt mother [name] [phone]) with [facility] [phone]) to discuss ELOS, and above about establishing a provider. States she will complete application with pt.   Loralee Pacas, MSW, [place] Office: [phone] Cell: [phone] Fax: [phone]

## 2020-06-20 NOTE — Progress Notes (Signed)
Occupational Therapy Session Note  Patient Details  Name: Jessica Frazier MRN: [id number] Date of Birth: 2000/05/30  Today's Date: 06/20/2020 OT Individual Time: 1530-1600 OT Individual Time Calculation (min): 30 min    Short Term Goals: Week 1:  OT Short Term Goal 1 (Week 1): NA 2/2 ELOS  Skilled Therapeutic Interventions/Progress Updates:    Pt seen this session to work on dynamic balance with standing at [employer] to work on organizing her clothing drawers,  Reaching for items off floor using a reacher, toileting with S, ambulation in room with close S.  Attempted 1 squat to practice picking up an item off the floor, but it was too painful on her R hip.  Discussed how she can be more active and involved at home to increase her general activity level. At end of session, pt opted to rest in bed.  Alarm set and all needs met.   Therapy Documentation Precautions:  Precautions Precautions: Fall Restrictions Weight Bearing Restrictions: No    Vital Signs: Therapy Vitals Temp: 97.8 F (36.6 C) Temp Source: Oral Pulse Rate: (!) 105 Resp: 15 BP: 111/71 Patient Position (if appropriate): Sitting Oxygen Therapy SpO2: 100 % O2 Device: Room Air Pain: Pain Assessment Pain Score: 0-No pain ADL: ADL Eating: Set up Where Assessed-Eating: Bed level Grooming: Setup Where Assessed-Grooming: Sitting at sink Upper Body Bathing: Supervision/safety Where Assessed-Upper Body Bathing: Shower Lower Body Bathing: Moderate assistance Where Assessed-Lower Body Bathing: Shower Upper Body Dressing: Supervision/safety Where Assessed-Upper Body Dressing: Chair Lower Body Dressing: Maximal assistance Where Assessed-Lower Body Dressing: Chair Toileting: Contact guard Where Assessed-Toileting: Glass blower/designer: Therapist, music Method: Counselling psychologist: Raised toilet seat,Grab bars Social research officer, government: Research scientist (life sciences) Method: Heritage manager: Shower seat with back    Therapy/Group: Individual Therapy  [place] 06/20/2020, 4:39 PM

## 2020-06-21 DIAGNOSIS — S069X0S Unspecified intracranial injury without loss of consciousness, sequela: Secondary | ICD-10-CM

## 2020-06-21 NOTE — Plan of Care (Signed)
  Problem: Consults Goal: RH BRAIN INJURY PATIENT EDUCATION Description: Description: See Patient Education module for eduction specifics Outcome: Progressing Goal: Nutrition Consult-if indicated Outcome: Progressing   Problem: RH SKIN INTEGRITY Goal: RH STG MAINTAIN SKIN INTEGRITY WITH ASSISTANCE Description: STG Maintain Skin Integrity With Mod I Assistance. Outcome: Progressing   Problem: RH SAFETY Goal: RH STG ADHERE TO SAFETY PRECAUTIONS W/ASSISTANCE/DEVICE Description: STG Adhere to Safety Precautions With Mod I Assistance/Device. Outcome: Progressing   Problem: RH COGNITION-NURSING Goal: RH STG ANTICIPATES NEEDS/CALLS FOR ASSIST W/ASSIST/CUES Description: STG Anticipates Needs/Calls for Assist With Cues and reminders. Outcome: Progressing   Problem: RH PAIN MANAGEMENT Goal: RH STG PAIN MANAGED AT OR BELOW PT'S PAIN GOAL Description: <3 on a 0-10 pain scale. Outcome: Progressing   Problem: RH KNOWLEDGE DEFICIT BRAIN INJURY Goal: RH STG INCREASE KNOWLEDGE OF SELF CARE AFTER BRAIN INJURY Description: Patient will be able to demonstrate medication management, dietary management, skin/wound care with educational materials and handouts provided by staff. Outcome: Progressing

## 2020-06-21 NOTE — Progress Notes (Signed)
Physical Therapy Session Note  Patient Details  Name: Jessica Frazier MRN: [id number] Date of Birth: 04-02-2000  Today's Date: 06/21/2020 PT Individual Time: 0911-1007 PT Individual Time Calculation (min): 56 min   Short Term Goals: Week 1:  PT Short Term Goal 1 (Week 1): STG = LTG d/t ELOS  Skilled Therapeutic Interventions/Progress Updates:    Pt received sitting up in recliner. Reported leg soreness from therapy sessions, but no pain. Agreeable to therapy. STS supervision and gait training to therapy gym supervision no AD ~252ft; VC to look up. Circuit training 3 rounds with ~24min rest between rounds for improved activity tolerance.  Circuit 1 (all CGA): -Alternating step ups onto 6" box to improve LE extensor strength; 5x per leg. VC for arm swing but pt was confused by this cue. -Ladder side stepping both ways to improve step length. Stepped on line ~3-4 times each round -Tandem stance hold for ~45 sec to improve static balance. 1st round with R foot in front; relatively easy. 2nd round with L foot in front; LOB occurred. 3rd round with L foot in front and cog dual task to sequentially subtract 3 from 50; pt had significantly difficult time performing simple subtraction even after tandem stance hold was finished and pt was sitting.  Pt requested to use bathroom. Ambulation to ADL apartment bathroom supervision. Therapist ensured pt was safely on toilet. Pt able to donn/doff pants and perform pericare independently. Continent of urine. STS CGA. Ambulation back to therapy gym supervision.  Circuit 2 (all CGA): -Stair stepping up and down 4 6" stairs for LE extensor strength. 2x up and down on 2nd and 3rd round. Pt able to perform CGA, when therapist providing knee blocking for safety she said she "got confused." R HR used; therapist asked pt if she could try without Hrs and pt said no. -Ladder stepping forward and backwards with 1 foot per block. Forward relatively easy; backwards  moderately challenging. -Single leg tapping with R leg towards colored numbers on ground. VC to tap color or number called out and maintain balance on L leg. Cross midline stepping was most difficult. Pt demonstrated ~3 bouts of LOB each round and verbalized "this is really hard."  Gait training back to room supervision ~253ft no AD. Pt demonstrated upright posture, increased arm swing, increased step length. Sitting EOB to supine supervision. Bed alarm turned on and needs within reach.  Therapy Documentation Precautions:  Precautions Precautions: Fall Restrictions Weight Bearing Restrictions: No    Therapy/Group: Individual Therapy  Eleonore Chiquito, SPT 06/21/2020, 2:48 PM

## 2020-06-21 NOTE — Progress Notes (Signed)
PROGRESS NOTE   Subjective/Complaints:  Patient states that she is going to the bathroom okay, she has been doing some therapy this morning and has another session this afternoon. ROS: Patient denies CP, N/V , Abd pain.   Objective:   No results found. Recent Labs    06/19/20 0617  WBC 3.9*  HGB 9.0*  HCT 28.0*  PLT 273   No results for input(s): NA, K, CL, CO2, GLUCOSE, BUN, CREATININE, CALCIUM in the last 72 hours.  Intake/Output Summary (Last 24 hours) at 06/21/2020 1119 Last data filed at 06/21/2020 0900 Gross per 24 hour  Intake 600 ml  Output --  Net 600 ml     Pressure Injury 06/16/20 Coccyx Medial Stage 2 -  Partial thickness loss of dermis presenting as a shallow open injury with a red, pink wound bed without slough. small area with broken skin (Active)  06/16/20 1530  Location: Coccyx  Location Orientation: Medial  Staging: Stage 2 -  Partial thickness loss of dermis presenting as a shallow open injury with a red, pink wound bed without slough.  Wound Description (Comments): small area with broken skin  Present on Admission: Yes    Physical Exam: Vital Signs Blood pressure (!) 110/57, pulse 82, temperature 98.7 F (37.1 C), temperature source Oral, resp. rate 20, height 5\' 2"  (1.575 m), weight 38.1 kg, SpO2 99 %.  General: No acute distress Mood and affect are appropriate Heart: Regular rate and rhythm no rubs murmurs or extra sounds Lungs: Clear to auscultation, breathing unlabored, no rales or wheezes Abdomen: Positive bowel sounds, soft nontender to palpation, nondistended Extremities: No clubbing, cyanosis, or edema Skin: No evidence of breakdown, no evidence of rash   Skin: coccyx area dressed Neuro: Reasonable insight, memory, and awareness. Cranial nerves 2-12 are intact. Sensory exam is normal. Reflexes are 1+ in all 4's. Fine motor coordination is intact. No tremors. Motor function is grossly  4/5 in all 4's. Slightly wide based gait. Musculoskeletal: RA deformities in fingers remain present     Assessment/Plan: 1. Functional deficits which require 3+ hours per day of interdisciplinary therapy in a comprehensive inpatient rehab setting.  Physiatrist is providing close team supervision and 24 hour management of active medical problems listed below.  Physiatrist and rehab team continue to assess barriers to discharge/monitor patient progress toward functional and medical goals  Care Tool:  Bathing    Body parts bathed by patient: Right arm,Left arm,Abdomen,Front perineal area,Chest,Buttocks,Right upper leg,Left upper leg,Face     Body parts n/a: Left lower leg,Right lower leg   Bathing assist Assist Level: Minimal Assistance - Patient > 75%     Upper Body Dressing/Undressing Upper body dressing   What is the patient wearing?: Hospital gown only,Pull over shirt    Upper body assist Assist Level: Supervision/Verbal cueing    Lower Body Dressing/Undressing Lower body dressing      What is the patient wearing?: Pants,Underwear/pull up     Lower body assist Assist for lower body dressing: Moderate Assistance - Patient 50 - 74%     Toileting Toileting    Toileting assist Assist for toileting: Independent Assistive Device Comment: walker   Transfers Chair/bed transfer  Transfers assist     Chair/bed transfer assist level: Contact Guard/Touching assist     Locomotion Ambulation   Ambulation assist      Assist level: Contact Guard/Touching assist Assistive device: No Device Max distance: 150'   Walk 10 feet activity   Assist     Assist level: Contact Guard/Touching assist Assistive device: No Device   Walk 50 feet activity   Assist    Assist level: Contact Guard/Touching assist Assistive device: No Device    Walk 150 feet activity   Assist Walk 150 feet activity did not occur: Safety/medical concerns  Assist level: Contact  Guard/Touching assist Assistive device: No Device    Walk 10 feet on uneven surface  activity   Assist Walk 10 feet on uneven surfaces activity did not occur: Safety/medical concerns         Wheelchair     Assist Will patient use wheelchair at discharge?: No   Wheelchair activity did not occur: N/A         Wheelchair 50 feet with 2 turns activity    Assist    Wheelchair 50 feet with 2 turns activity did not occur: N/A       Wheelchair 150 feet activity     Assist  Wheelchair 150 feet activity did not occur: N/A       Blood pressure (!) 110/57, pulse 82, temperature 98.7 F (37.1 C), temperature source Oral, resp. rate 20, height 5\' 2"  (1.575 m), weight 38.1 kg, SpO2 99 %.  Medical Problem List and Plan: 1.   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    -Continue CIR therapies including PT, OT  2. Antithrombotics: -DVT/anticoagulation:Mechanical:Sequential compression devices, below kneeBilateral lower extremities, INR 1.1  APTT 34--can dc daily checks -vitamin k thru 4/11 -antiplatelet therapy: N/A 3. Pain Management:Tramadol prn. Denies abdominal pain today. 4. Mood:LCSW to follow for evaluation and support. -antipsychotic agents: N/A 5. Neuropsych: This patientiscapable of making decisions onherown behalf. 6. Skin/Wound Care:Routine pressure relief measures. 7. Fluids/Electrolytes/Nutrition:encourage appropriate PO 8. TBI with SAH/IPH:  9. Anemia:  .  --no gross blood clinically.  ----H/H variable, 8.5 4/5--8.8 4/6-->9.0 4/7--recheck Monday 4/11 10. Severe fatty liver w/Abnormal LFTs and coagulopathy:Continue vitamin K thorough 06/23/20.  --to transfuse prn Hgb<8  or symptomatic  --Liver biopsy 03/31 to rule of AIH (ANA+ and IgG elevated)   -bx with minimally active steatohepatitis No abd pain or hepatomegaly 11. Seronegative RA/Lupus: On prednisone 40 mg day-->to continue to follow-up with rheumatology? 12.Thrombocytopenia: Has resolved. --Monitor and transfuse prn Plt<50  4/7 counts stable--recheck Monday 13. Adjustement disorder/depression?: On Remeron At bedtime.  14.Sick euthyroid: Continue Synthroid supplement. 15. Hypokalemia:Likely due to steroids and poor intake. --3.7 4/5, continue supplement.    LOS: 5 days A FACE TO FACE EVALUATION WAS PERFORMED  Charlett Blake 06/21/2020, 11:19 AM

## 2020-06-21 NOTE — Progress Notes (Signed)
Physical Therapy TBI Note  Patient Details  Name: Jessica Frazier MRN: [id number] Date of Birth: 07-31-2000  Today's Date: 06/21/2020 PT Individual Time: 1535-1605 PT Individual Time Calculation (min): 30 min   Short Term Goals: Week 1:  PT Short Term Goal 1 (Week 1): STG = LTG d/t ELOS  Skilled Therapeutic Interventions/Progress Updates:  Pt in BR on toilet, with Josh, Therapist, sports.  Continent of bladder; independent for peri care.  Pt denied pain.  Supervision for toilet transfer and hand washing in standing.  Gait training on level tile, no AD, close supervision x 100' .  Gait training kicking a small cone with L/R foot x 100' with CGA, no LOB.  Continual cues for knee flexion before kicking, for improved power.  neuromuscular re-education via demo and multimodal cues for seated: bil scapular retraction, pelvic tilts, trunk extension/flexion, reciprocal scooting forward/backward without use of UEs for recruitment of L pelvis/trunk muscles,  with improvement with practice. Standing: mini squats, toe/heel lifts with R or LUE elevation.  At end of session, pt moved sit> supine on bed with supervision.  PT set bed alarm and left needs at hand.  Family present.  Therapy Documentation Precautions:  Precautions Precautions: Fall Restrictions Weight Bearing Restrictions: No     Agitated Behavior Scale: TBI Observation Details Observation Environment: CIR Start of observation period - Date: 06/21/20 Start of observation period - Time: 1535 End of observation period - Date: 06/21/20 End of observation period - Time: 1605 Agitated Behavior Scale (DO NOT LEAVE BLANKS) Short attention span, easy distractibility, inability to concentrate: Absent Impulsive, impatient, low tolerance for pain or frustration: Absent Uncooperative, resistant to care, demanding: Absent Violent and/or threatening violence toward people or property: Absent Explosive and/or unpredictable anger: Absent Rocking,  rubbing, moaning, or other self-stimulating behavior: Absent Pulling at tubes, restraints, etc.: Absent Wandering from treatment areas: Absent Restlessness, pacing, excessive movement: Absent Repetitive behaviors, motor, and/or verbal: Absent Rapid, loud, or excessive talking: Absent Sudden changes of mood: Absent Easily initiated or excessive crying and/or laughter: Absent Self-abusiveness, physical and/or verbal: Absent Agitated behavior scale total score: 14       Therapy/Group: Individual Therapy  Omar Gayden 06/21/2020, 4:23 PM

## 2020-06-21 NOTE — Progress Notes (Signed)
Occupational Therapy Session Note  Patient Details  Name: Jessica Frazier MRN: [id number] Date of Birth: May 21, 2000  Today's Date: 06/21/2020 OT Individual Time: 0700-0800 OT Individual Time Calculation (min): 60 min    Short Term Goals: Week 1:  OT Short Term Goal 1 (Week 1): NA 2/2 ELOS  Skilled Therapeutic Interventions/Progress Updates:    1:1. Pt received in bed agreeable to OT. Pt and OT agree to shower in tub room to simulate home environment. Pt gathers all needed items with mod question cues to recall needing to gather towels, wash cloths, shampoo and conditioner. Pt continues to demo deficits in self organizing, gathering, reaching, and planning ahead. W/ transport with total A to ADL apartment d/t time constraints and pt requires question cues to recall doffing strategy to sit down for LB clothing. Pt able to be coached through using BLE to doff clothing over feet as figure 4 painful on hips. Pt able to locate hooks to hand towels in prep for shower. Pt fearful of falling and requires MAX VC for not using grab bars to step into/over tub ledge. Pt completes step over with supervision steadying self with tub wall laterally stepping over tub ledge with water running as this is pts habit at home. Educated pt can wait to turn water on until in tub to improve safety and comfort with transfer. Overall set up for bathing with pt self iniating sittin gonto shower seat in tub. Pt completes dressing with supervision for underwear/pants. Pt dons slippers for footwear and requires set up A for bra/shirt. Grooming standing with set up. Pt gathers all items to take back to room with no cuing A. Exited session with pt seated in recliner, exit alarm on and call light in reach   Therapy Documentation Precautions:  Precautions Precautions: Fall Restrictions Weight Bearing Restrictions: No General:   Vital Signs: Therapy Vitals Temp: 98.7 F (37.1 C) Temp Source: Oral Pulse Rate: 82 Resp:  20 BP: (!) 110/57 Patient Position (if appropriate): Lying Oxygen Therapy SpO2: 99 % O2 Device: Room Air Pain:   ADL: ADL Eating: Set up Where Assessed-Eating: Bed level Grooming: Setup Where Assessed-Grooming: Sitting at sink Upper Body Bathing: Supervision/safety Where Assessed-Upper Body Bathing: Shower Lower Body Bathing: Moderate assistance Where Assessed-Lower Body Bathing: Shower Upper Body Dressing: Supervision/safety Where Assessed-Upper Body Dressing: Chair Lower Body Dressing: Maximal assistance Where Assessed-Lower Body Dressing: Chair Toileting: Contact guard Where Assessed-Toileting: Glass blower/designer: Therapist, music Method: Counselling psychologist: Raised toilet seat,Grab bars Social research officer, government: Curator Method: Heritage manager: Civil engineer, contracting with back Glass blower/designer   Exercises:   Other Treatments:     Therapy/Group: Individual Therapy  Tonny Branch 06/21/2020, 6:47 AM

## 2020-06-23 DIAGNOSIS — L8990 Pressure ulcer of unspecified site, unspecified stage: Secondary | ICD-10-CM | POA: Insufficient documentation

## 2020-06-23 LAB — CBC
HCT: 31.8 % — ABNORMAL LOW (ref 36.0–46.0)
Hemoglobin: 9.9 g/dL — ABNORMAL LOW (ref 12.0–15.0)
MCH: 30.7 pg (ref 26.0–34.0)
MCHC: 31.1 g/dL (ref 30.0–36.0)
MCV: 98.8 fL (ref 80.0–100.0)
Platelets: 391 10*3/uL (ref 150–400)
RBC: 3.22 MIL/uL — ABNORMAL LOW (ref 3.87–5.11)
RDW: 18.3 % — ABNORMAL HIGH (ref 11.5–15.5)
WBC: 4.5 10*3/uL (ref 4.0–10.5)
nRBC: 0 % (ref 0.0–0.2)

## 2020-06-23 LAB — BASIC METABOLIC PANEL
Anion gap: 9 (ref 5–15)
BUN: 14 mg/dL (ref 6–20)
CO2: 25 mmol/L (ref 22–32)
Calcium: 9.1 mg/dL (ref 8.9–10.3)
Chloride: 99 mmol/L (ref 98–111)
Creatinine, Ser: 0.52 mg/dL (ref 0.44–1.00)
GFR, Estimated: >60 mL/min (ref >60)
Glucose, Bld: 114 mg/dL — ABNORMAL HIGH (ref 70–99)
Potassium: 4.2 mmol/L (ref 3.5–5.1)
Sodium: 133 mmol/L — ABNORMAL LOW (ref 135–145)

## 2020-06-23 LAB — PROTIME-INR
INR: 1.1 (ref 0.8–1.2)
Prothrombin Time: 13.5 seconds (ref 11.4–15.2)

## 2020-06-23 LAB — APTT: aPTT: 33 seconds (ref 24–36)

## 2020-06-23 NOTE — Progress Notes (Signed)
PROGRESS NOTE   Subjective/Complaints:  No new issues. Mild headache this morning but she thinks it's because she didn't eat enough for breakfast  ROS: Patient denies fever, rash, sore throat, blurred vision, nausea, vomiting, diarrhea, cough, shortness of breath or chest pain, joint or back pain,  or mood change.    Objective:   No results found. Recent Labs    06/23/20 0609  WBC 4.5  HGB 9.9*  HCT 31.8*  PLT 391   No results for input(s): NA, K, CL, CO2, GLUCOSE, BUN, CREATININE, CALCIUM in the last 72 hours.  Intake/Output Summary (Last 24 hours) at 06/23/2020 1128 Last data filed at 06/23/2020 0729 Gross per 24 hour  Intake 600 ml  Output --  Net 600 ml     Pressure Injury 06/16/20 Coccyx Medial Stage 2 -  Partial thickness loss of dermis presenting as a shallow open injury with a red, pink wound bed without slough. small area with broken skin (Active)  06/16/20 1530  Location: Coccyx  Location Orientation: Medial  Staging: Stage 2 -  Partial thickness loss of dermis presenting as a shallow open injury with a red, pink wound bed without slough.  Wound Description (Comments): small area with broken skin  Present on Admission: Yes    Physical Exam: Vital Signs Blood pressure 114/65, pulse 81, temperature 98.9 F (37.2 C), resp. rate 16, height 5\' 2"  (1.575 m), weight 40.2 kg, SpO2 100 %.  Constitutional: No distress . Vital signs reviewed. HEENT: EOMI, oral membranes moist Neck: supple Cardiovascular: RRR without murmur. No JVD    Respiratory/Chest: CTA Bilaterally without wheezes or rales. Normal effort    GI/Abdomen: BS +, non-tender, non-distended Ext: no clubbing, cyanosis, or edema Psych: pleasant and cooperative, quiet Skin: coccyx area dressed Neuro: Reasonable insight, memory, and awareness. Cranial nerves 2-12 are intact. Sensory exam is normal. Reflexes are 1+ in all 4's. Fine motor coordination  is intact. No tremors. Motor function is grossly 4+/5 in all 4's. Musculoskeletal: RA deformities in fingers remain present     Assessment/Plan: 1. Functional deficits which require 3+ hours per day of interdisciplinary therapy in a comprehensive inpatient rehab setting.  Physiatrist is providing close team supervision and 24 hour management of active medical problems listed below.  Physiatrist and rehab team continue to assess barriers to discharge/monitor patient progress toward functional and medical goals  Care Tool:  Bathing    Body parts bathed by patient: Right arm,Left arm,Abdomen,Front perineal area,Chest,Buttocks,Right upper leg,Left upper leg,Face     Body parts n/a: Left lower leg,Right lower leg   Bathing assist Assist Level: Minimal Assistance - Patient > 75%     Upper Body Dressing/Undressing Upper body dressing   What is the patient wearing?: Hospital gown only,Pull over shirt    Upper body assist Assist Level: Supervision/Verbal cueing    Lower Body Dressing/Undressing Lower body dressing      What is the patient wearing?: Pants,Underwear/pull up     Lower body assist Assist for lower body dressing: Moderate Assistance - Patient 50 - 74%     Toileting Toileting    Toileting assist Assist for toileting: Independent Assistive Device Comment: walker   Transfers  Chair/bed transfer  Transfers assist     Chair/bed transfer assist level: Contact Guard/Touching assist     Locomotion Ambulation   Ambulation assist      Assist level: Supervision/Verbal cueing Assistive device: No Device Max distance: 289ft   Walk 10 feet activity   Assist     Assist level: Supervision/Verbal cueing Assistive device: No Device   Walk 50 feet activity   Assist    Assist level: Supervision/Verbal cueing Assistive device: No Device    Walk 150 feet activity   Assist Walk 150 feet activity did not occur: Safety/medical concerns  Assist level:  Supervision/Verbal cueing Assistive device: No Device    Walk 10 feet on uneven surface  activity   Assist Walk 10 feet on uneven surfaces activity did not occur: Safety/medical concerns         Wheelchair     Assist Will patient use wheelchair at discharge?: No   Wheelchair activity did not occur: N/A         Wheelchair 50 feet with 2 turns activity    Assist    Wheelchair 50 feet with 2 turns activity did not occur: N/A       Wheelchair 150 feet activity     Assist  Wheelchair 150 feet activity did not occur: N/A       Blood pressure 114/65, pulse 81, temperature 98.9 F (37.2 C), resp. rate 16, height 5\' 2"  (1.575 m), weight 40.2 kg, SpO2 100 %.  Medical Problem List and Plan: 1.   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 goals of supervision to modified independent.    -Continue CIR therapies including PT, OT  2. Antithrombotics: -DVT/anticoagulation:Mechanical:Sequential compression devices, below kneeBilateral lower extremities, INR 1.1  APTT 34--can dc daily checks -vitamin k thru 4/11 today -antiplatelet therapy: N/A 3. Pain Management:Tramadol prn. Denies abdominal pain today. 4. Mood:LCSW to follow for evaluation and support. -antipsychotic agents: N/A 5. Neuropsych: This patientiscapable of making decisions onherown behalf. 6. Skin/Wound Care:Routine pressure relief measures. 7. Fluids/Electrolytes/Nutrition:encourage appropriate PO 8. TBI with SAH/IPH:  9. Anemia:  .  --no gross blood clinically.  ----H/H trending up---9.9 4/11 10. Severe fatty liver w/Abnormal LFTs and coagulopathy:Continue vitamin K thorough 06/23/20.  --to transfuse prn Hgb<8 or symptomatic  --Liver biopsy 03/31 to rule of  AIH (ANA+ and IgG elevated)   -bx with minimally active steatohepatitis No abd pain or hepatomegaly 11. Seronegative RA/Lupus: On prednisone 40 mg day-->to continue to follow-up with rheumatology? 12.Thrombocytopenia: Has resolved. --Monitor and transfuse prn Plt<50  4/11 plt counts climbing---391k today 13. Adjustement disorder/depression?: On Remeron At bedtime.  14.Sick euthyroid: Continue Synthroid supplement. 15. Hypokalemia:Likely due to steroids and poor intake. --3.7 4/5, continue supplement.    LOS: 7 days A FACE TO FACE EVALUATION WAS PERFORMED  Meredith Staggers 06/23/2020, 11:28 AM

## 2020-06-23 NOTE — Progress Notes (Signed)
Patient ID: Jessica Frazier, female   DOB: 04-22-2000, 20 y.o.   MRN: [id number]  Per medical team, pt will doing really well, and would like family education with patient mother, and possible Wednesday discharge.   SW spoke with pt mother Jessica Frazier [phone] with [facility]#[id number] [phone]) to discuss family edu. Pt mother will be here tomorrow 1pm-3pm.  SW received new patient application from pt for [facility] ([phone] or [phone]/f:[phone]). SW faxed application.   SW provided pt with HHA list and encouraged her to pick an agency.  Loralee Pacas, MSW, [place] Office: [phone] Cell: [phone] Fax: [phone]

## 2020-06-23 NOTE — Progress Notes (Signed)
Occupational Therapy Session Note  Patient Details  Name: Jessica Frazier MRN: [id number] Date of Birth: 08-26-2000  Today's Date: 06/23/2020 OT Individual Time: 1105-1200 OT Individual Time Calculation (min): 55 min   Session 2: OT Individual Time: 2707-8675 OT Individual Time Calculation (min): 54 min    Short Term Goals: Week 1:  OT Short Term Goal 1 (Week 1): NA 2/2 ELOS  Skilled Therapeutic Interventions/Progress Updates:    Pt sitting in recliner with no c/o pain, agreeable to OT session. Pt requesting to shower at afternoon session. She completed functional mobility at distant supervision level, occasional postural sway but no LOB in controlled, hospital environment. Pt completed functional activity with focus on path finding, using external cueing/aids independently, problem solving, and short term memory of going down to giftshop and locating several items identified by OT. She required min cueing for problem solving use of hospital map- at times appropriate/expected level of confusion but still obviously limited by practical intelligence deficits and working memory. She required a seated rest break after 500 ft of functional mobility with her HR elevated to 125 bpm. During rest breaks and mobility pt and OT discussed home routine, future planning, and realistic goal setting. Pt was able to complete mental math appropriately during budgeting activity with min cueing overall. She ended session with a core stabilization activity, seated on a large therapy ball and reaching outside BOS with touching assist provided just for safety. Pt returned to her room and was left sitting up in the recliner with all needs met.    Session 2:  Pt sitting in recliner with no c/o pain. Pt requesting to take shower and use bathroom. Ambulatory transfer into the bathroom with distant supervision. All toileting tasks completed at mod I level. Pt's mother arrived during session as well. Pt stating she  feels "much better" and is very encouraged by her progress. She completed shower transfer with supervision, doffing all clothing in standing. She opted to try and not use shower chair this session to challenge functional activity tolerance. Pt completed all bathing standing with min cueing for safety when washing distally. Pt HR following shower 144 bpm. With prolonged rest break she was able to come down to 120 bpm. Discussed weight loss during hospitalization. Pt ended session with 300 ft of functional mobility at supervision level. HR remained stable at 120 bpm. Pt was left supine with all needs met, bed alarm set.   Therapy Documentation Precautions:  Precautions Precautions: Fall Restrictions Weight Bearing Restrictions: No  Therapy/Group: Individual Therapy  Curtis Sites 06/23/2020, 6:20 AM

## 2020-06-23 NOTE — Progress Notes (Signed)
Physical Therapy Session Note  Patient Details  Name: Jessica Frazier MRN: [id number] Date of Birth: 03/02/2001  Today's Date: 06/23/2020 PT Individual Time: 0910-1010 PT Individual Time Calculation (min): 60 min   Short Term Goals: Week 1:  PT Short Term Goal 1 (Week 1): STG = LTG d/t ELOS  Skilled Therapeutic Interventions/Progress Updates:     Patient in bed upon PT arrival. Patient alert and agreeable to PT session. Patient denied pain during session.  Therapeutic Activity: Bed Mobility: Patient performed supine to/from sit with min A to sit up x1 and supervision on second trial. Patient with upper extremity and core weakness to sit up in a flat bed without use of bed rail. Problem solved different techniques, patient able to sit up using B elbows pushing into the bed then walking elbows back to 1/2 sitting then push up to her hands with min cues following PT demonstration. Educated patient on using this technique without assistance in a flat bed every time she gets OOB during remainder of stay, patient in agreement. Transfers: Patient performed sit to/from stand from various household surfaces with mod I.  Patient performed ambulatory transfer to/from the bathroom and toileting with distant supervision without LOB during session. Patient continent of bladder during toileting.  Patient performed a simulated SUV height car transfer with running board with CGA without AD. Provided cues for safe technique. Performed step-in transfer on first trial and sit>turn on second with improved balance during second trial following therapist's cues.  Gait Training:  Patient ambulated >300 feet, and >150 feet x2 without and AD with supervision. Ambulated with decreased gait speed, decreased arm swing and trunk rotation, and intermittent downward head gaze. Provided verbal cues for looking ahead with visual scanning and increased arm swing for improved gait speed and balance. Patient  ascended/descended 12-6" steps using L rail with close supervision for safety. Performed reciprocal gait pattern.   Neuromuscular Re-ed: Patient performed the following balance assessments: Patient demonstrates reduced fall risk as noted by score of 52/56 on Berg Balance Scale.  (<36= high risk for falls, close to 100%; 37-45 significant >80%; 46-51 moderate >50%; 52-55 lower >25%) Improved from 23/56 at evaluation. Patient demonstrates reduced fall risk as noted by score of 21/30 on Functional Gait Assessment.  (<19= high risk for falls with dynamic gait) Improved from 0/30 on evaluation.  Discussed d/c planning, setting family education, and POC during rest breaks between activities throughout session.   Patient in recliner in the room at end of session with breaks locked, chair alarm set, and all needs within reach.    Therapy Documentation Precautions:  Precautions Precautions: Fall Restrictions Weight Bearing Restrictions: No  Balance: Standardized Balance Assessment Standardized Balance Assessment: Functional Gait Assessment Berg Balance Test Sit to Stand: Able to stand without using hands and stabilize independently Standing Unsupported: Able to stand safely 2 minutes Sitting with Back Unsupported but Feet Supported on Floor or Stool: Able to sit safely and securely 2 minutes Stand to Sit: Sits safely with minimal use of hands Transfers: Able to transfer safely, minor use of hands Standing Unsupported with Eyes Closed: Able to stand 10 seconds safely Standing Ubsupported with Feet Together: Able to place feet together independently and stand 1 minute safely From Standing, Reach Forward with Outstretched Arm: Can reach confidently >25 cm (10") From Standing Position, Pick up Object from Floor: Able to pick up shoe safely and easily From Standing Position, Turn to Look Behind Over each Shoulder: Looks behind from both sides and  weight shifts well Turn 360 Degrees: Able to turn  360 degrees safely in 4 seconds or less Standing Unsupported, Alternately Place Feet on Step/Stool: Able to stand independently and safely and complete 8 steps in 20 seconds Standing Unsupported, One Foot in Front: Able to plae foot ahead of the other independently and hold 30 seconds Standing on One Leg: Tries to lift leg/unable to hold 3 seconds but remains standing independently Total Score: 52/56 Functional Gait  Assessment Gait Level Surface: Walks 20 ft in less than 7 sec but greater than 5.5 sec, uses assistive device, slower speed, mild gait deviations, or deviates 6-10 in outside of the 12 in walkway width. Change in Gait Speed: Makes only minor adjustments to walking speed, or accomplishes a change in speed with significant gait deviations, deviates 10-15 in outside the 12 in walkway width, or changes speed but loses balance but is able to recover and continue walking. Gait with Horizontal Head Turns: Performs head turns smoothly with slight change in gait velocity (eg, minor disruption to smooth gait path), deviates 6-10 in outside 12 in walkway width, or uses an assistive device. Gait with Vertical Head Turns: Performs task with slight change in gait velocity (eg, minor disruption to smooth gait path), deviates 6 - 10 in outside 12 in walkway width or uses assistive device Gait and Pivot Turn: Pivot turns safely within 3 sec and stops quickly with no loss of balance. Step Over Obstacle: Is able to step over 2 stacked shoe boxes taped together (9 in total height) without changing gait speed. No evidence of imbalance. Gait with Narrow Base of Support: Ambulates 7-9 steps. Gait with Eyes Closed: Walks 20 ft, uses assistive device, slower speed, mild gait deviations, deviates 6-10 in outside 12 in walkway width. Ambulates 20 ft in less than 9 sec but greater than 7 sec. Ambulating Backwards: Walks 20 ft, uses assistive device, slower speed, mild gait deviations, deviates 6-10 in outside 12 in  walkway width. Steps: Alternating feet, must use rail. Total Score: 21/30   Therapy/Group: Individual Therapy  Chantry Headen L Finlee Milo PT, DPT  06/23/2020, 4:28 PM

## 2020-06-23 NOTE — Progress Notes (Signed)
Nutrition Follow-up  DOCUMENTATION CODES:   Severe malnutrition in context of chronic illness  INTERVENTION:   - Continue Ensure Enlive po TID, each supplement provides 350 kcal and 20 grams of protein  - Provided "High Calorie, High Protein Nutrition Therapy" handout and diet education (provided [place] and [place] versions)  - Continue vitamin and mineral supplementation  - Encourage adequate PO intake  NUTRITION DIAGNOSIS:   Severe Malnutrition related to chronic illness as evidenced by moderate fat depletion,severe fat depletion,moderate muscle depletion,severe muscle depletion,percent weight loss (18.9% weight loss in 10 months).  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 Admitted to CIR on 4/04.  Spoke with pt at bedside. She reports that appetite and PO intake are improving. She is drinking 1-2 Ensure supplements daily. Pt reports consuming 100% of an Ensure supplement earlier this morning. RD encouraged pt to drink at least 2 daily. RD to add extra items to meal trays for pt to snack on in between meals. Pt also with snack items in room.  Pt is concerned about her low weight. Explained that pt has increased energy needs related to prolonged period of inadequate nutrition. Encouraged pt to consume supplements and eat well at mealtimes.  RD provided "High Calorie, High Protein Nutrition Therapy" handout and diet education. RD also provided Spanish  version of handouts for pt's mother to review.  CIR admit weight: 48.2 kg Current weight: 40.2 kg  Meal Completion: 10-100%  Medications reviewed and include: B-complex with vitamin C, cholecalciferol, Ensure Enlive TID, remeron, MVI with minerals, klor-con, prednisone, thiamine, zinc sulfate  Labs reviewed: sodium 133, hemoglobin 9.9  Vitamin/Mineral Profile:  Thiamine B1: 82.9 (WNL) Folate B9: 6.9 (WNL) Vitamin A: 21.9 (low normal) Vitamin D: 27.24 (low) Vitamin C: 0.4 (low normal) Zinc: 41 (low)  Diet Order:   Diet Order            Diet regular Room service appropriate? Yes; Fluid consistency: Thin  Diet effective now                 EDUCATION NEEDS:   Education needs have been addressed  Skin:  Skin Assessment: Skin Integrity Issues: Stage II: coccyx  Last BM:  06/24/20 large type 4  Height:   Ht Readings from Last 1 Encounters:  06/16/20 5\' 2"  (1.575 m)    Weight:   Wt Readings from Last 1 Encounters:  06/22/20 40.2 kg    BMI:  Body mass index is 16.21 kg/m.  Estimated Nutritional Needs:   Kcal:  1900-2100  Protein:  80-95 grams  Fluid:  >/= 2.0 L    Gustavus Bryant, MS, RD, LDN Inpatient Clinical Dietitian Please see AMiON for contact information.

## 2020-06-23 NOTE — Progress Notes (Signed)
Inpatient Rehabilitation Care Coordinator Discharge Note  The overall goal for the admission was met for:   Discharge location: Yes. Dc to home with 24/7 care from mother.   Length of Stay: Yes. 8 days.  Discharge activity level: Yes. Mod I household mobility; supervision community mobility.  Home/community participation: Yes. Limited.   Services provided included: MD, RD, PT, OT, RN, CM, TR, Pharmacy, Neuropsych and SW  Financial Services: Private Insurance: [place] offered to/list presented to:Yes  Follow-up services arranged: Outpatient: [facility] for outpatient PT/OT  Comments (or additional information): Pt will have new patient appt with [facility]. The office will call to schedule appointment.   Patient/Family verbalized understanding of follow-up arrangements: Yes  Individual responsible for coordination of the follow-up plan: contact pt mother Lahoma Crocker [phone] (spanish interpreter for mother required).   Confirmed correct DME delivered: Rana Snare 06/23/2020    Rana Snare

## 2020-06-24 MED ORDER — PREDNISONE 5 MG PO TABS
30.0000 mg | ORAL_TABLET | Freq: Every day | ORAL | Status: DC
  Administered 2020-06-25: 30 mg via ORAL
  Filled 2020-06-24: qty 1

## 2020-06-24 NOTE — Discharge Summary (Signed)
Physician Discharge Summary  Patient ID: Jessica Frazier MRN: [id number] DOB/AGE: March 09, 2001 20 y.o.  Admit date: 06/16/2020 Discharge date: 06/24/2020  Discharge Diagnoses:  Principal Problem:   TBI (traumatic brain injury) [facility]) Active Problems:   Acquired hypothyroidism   Elevated LFTs   Anemia   Hepatic steatosis   Discharged Condition: stable   Significant Diagnostic Studies: N/A   Labs:  Basic Metabolic Panel: BMP Latest Ref Rng & Units 06/23/2020 06/17/2020 06/17/2020  Glucose 70 - 99 mg/dL 114(H) 85 105(H)  BUN 6 - 20 mg/dL 14 10 8   Creatinine 0.44 - 1.00 mg/dL 0.52 0.35(L) 0.37(L)  Sodium 135 - 145 mmol/L 133(L) 136 133(L)  Potassium 3.5 - 5.1 mmol/L 4.2 3.7 3.3(L)  Chloride 98 - 111 mmol/L 99 104 103  CO2 22 - 32 mmol/L 25 25 26   Calcium 8.9 - 10.3 mg/dL 9.1 8.5(L) 8.2(L)    CBC: CBC Latest Ref Rng & Units 06/25/2020 06/23/2020 06/19/2020  WBC 4.0 - 10.5 K/uL 7.1 4.5 3.9(L)  Hemoglobin 12.0 - 15.0 g/dL 11.1(L) 9.9(L) 9.0(L)  Hematocrit 36.0 - 46.0 % 34.2(L) 31.8(L) 28.0(L)  Platelets 150 - 400 K/uL 464(H) 391 273    CBG: No results for input(s): GLUCAP in the last 168 hours.  Brief HPI:   Jessica Frazier is a 20 y.o. female with history of seronegative RA, Bell's palsy, hypothyroidism, abnormal LFTs felt to be due to Rinvoq and was admitted on 06/02/2020 with weakness, black stools and pancytopenia.  She was found to have severe fatty liver with steatohepatitis, coagulopathy with INR 2.6 and fresh frozen plasma ordered however was discontinued to fever.  She did sustain a fall in ED and was found to have multifocal acute SAH predominantly over parietal lobes.  Dr. Arnoldo Morale recommended conservative care with serial CT for monitoring as neurologically stable.  Dr. Claiborne Billings was consulted for input and recommended vitamin K x2 weeks as well as monitoring of PT/INR/fibrinogen and transfusing patient as needed if platelets are less than 50, INR 1.5 or fibrinogen less  than 200 and bone biopsy ordered for work-up.  She was started on steroids with improvement.  GI panel ordered due to diarrhea and was negative for infectious source.  Dr. Allene Pyo questioned AIH as cause of persistently elevated LFTs and liver biopsy performed by radiology on 03/30.  This revealed minimally active steatohepatitis without fibrosis.  Thrombocytopenia had resolved and patient had decrease in abdominal pain and improvement in mentation.  Therapy was ongoing and she continued to be limited by weakness, balance deficits as well as delayed processing.  CIR was recommended due to functional decline.     Hospital Course: Jessica Frazier was admitted to rehab 06/16/2020 for inpatient therapies to consist of PT  and OT at least three hours five days a week. Past admission physiatrist, therapy team and rehab RN have worked together to provide customized collaborative inpatient rehab. Her blood pressures were monitored on TID basis and is relatively controlled but she continues to have tachycardia with increased activity. Follow up BMET showed hyponatremia which is stable. She was started on Kdur bid due to hypokalemia and follow up labs showed adequate supplementation. Recommend follow up BMET in 1-2 weeks to monitor potassium levels.  She was maintained on vitamin K thorough 04/11. CBC showed that ABLA is resolving and platelets are stable. Her po intake has been good and she is continent of B/B. Prednisone was tapered to 30 mg daily on 04/12 per discussion with Marella Chimes PA/ rheum.  Mood has been stable on Remeron. Her balance has improved and abdominal pain has resolved.  She has made steady progress during her stay and is currently modified independent in supervised setting.  She will continue to receive follow-up outpatient PT and OT [facility] after [facility].   Rehab course: During patient's stay in rehab weekly team conference was held to monitor patient's progress, set goals  and discuss barriers to discharge. At admission, patient required min assist with mobility and supervision to mod assist with ADL tasks. She has had improvement in activity tolerance, balance, postural control as well as ability to compensate for deficits.  She is able to complete ADL tasks at modified independent level. She is modified independent for transfers and to ambulate 100-200' without AD. Supervision is recommended when fatigued.  Family can provide assistance as needed.  Disposition:  Home  Diet: Regular  Special Instructions: 1. No driving till cleared by MD. 2. Repeat BMET in 1-2 weeks to monitor potassium levels.    Discharge Instructions    Ambulatory referral to Hematology / Oncology   Complete by: As directed    Follow up with Dr. Irene Limbo for coagulopathy   Ambulatory referral to Physical Medicine Rehab   Complete by: As directed    1-2 weeks transitional care appt     Allergies as of 06/25/2020      Reactions   Other Diarrhea, Other (See Comments)   Omega XL   Rinvoq [upadacitinib] Other (See Comments)   Enlarged the patient's liver- had to come to the ED, 2022      Medication List    STOP taking these medications   FLUoxetine 10 MG capsule Commonly known as: PROZAC   pantoprazole 40 MG tablet Commonly known as: PROTONIX   phytonadione 5 MG tablet Commonly known as: VITAMIN K   traMADol 50 MG tablet Commonly known as: ULTRAM     TAKE these medications   B-complex with vitamin C tablet Take 1 tablet by mouth daily.   hydroxypropyl methylcellulose / hypromellose 2.5 % ophthalmic solution Commonly known as: ISOPTO TEARS / GONIOVISC Place 1 drop into both eyes 3 (three) times daily.   levothyroxine 75 MCG tablet Commonly known as: SYNTHROID Take 1 tablet (75 mcg total) by mouth daily.   mirtazapine 30 MG tablet Commonly known as: REMERON Take 1 tablet (30 mg total) by mouth at bedtime.   multivitamin with minerals Tabs tablet Take 1 tablet by  mouth daily.   potassium chloride SA 20 MEQ tablet Commonly known as: KLOR-CON Take 1 tablet (20 mEq total) by mouth 2 (two) times daily.   predniSONE 10 MG tablet Commonly known as: DELTASONE Take 3 tablets (30 mg total) by mouth daily with breakfast. What changed:   medication strength  how much to take   thiamine 100 MG tablet Take 1 tablet (100 mg total) by mouth daily.   Vitamin D3 25 MCG tablet Commonly known as: Vitamin D Take 1 tablet (1,000 Units total) by mouth daily.   zinc sulfate 220 (50 Zn) MG capsule Take 1 capsule (220 mg total) by mouth daily.       Follow-up Information    Meredith Staggers, MD Follow up.   Specialty: Physical Medicine and Rehabilitation Why: office will call you with follow up appointment Contact information: [address] [place] [address] [phone]        Rosita Kea, PA-C Follow up on 07/08/2020.   Specialty: Family Medicine Why:  Appointment at 11:45 am/be there at 11:30 Contact information: [place] [address] [phone]        Brunetta Genera, MD. Call.   Specialties: Hematology, Oncology Why: for follow up appointment Contact information: [place] [address] [phone]        Carol Ada, MD Follow up.   Specialty: Gastroenterology Why: for follow up appointment/liver issues Contact information: [address] [place] [address] [phone]               Signed: Bary Leriche 06/27/2020, 8:46 AM

## 2020-06-24 NOTE — Patient Care Conference (Signed)
Inpatient RehabilitationTeam Conference and Plan of Care Update Date: 06/24/2020   Time: 10:05 AM    Patient Name: Jessica Frazier      Medical Record Number: [id number]  Date of Birth: 25-Sep-2000 Sex: Female         Room/Bed: 4W04C/4W04C-01 Payor Info: Payor: [place]  / Plan: [facility] / Product Type: *No Product type* /    Admit Date/Time:  06/16/2020  2:17 PM  Primary Diagnosis:  TBI (traumatic brain injury) [facility])  Hospital Problems: Principal Problem:   TBI (traumatic brain injury) [facility])    Expected Discharge Date: Expected Discharge Date: 06/25/20  Team Members Present: Physician leading conference: Dr. Alger Simons Care Coodinator Present: Loralee Pacas, LCSWA;Romaine Maciolek Creig Hines, RN, BSN, CRRN Nurse Present: Dorthula Nettles, RN PT Present: Apolinar Junes, PT OT Present: Laverle Hobby, OT PPS Coordinator present : Gunnar Fusi, SLP     Current Status/Progress Goal Weekly Team Focus  Bowel/Bladder   continentx2. last BM 4/11  Remain continent of both bowel and bladder.  Remain  continent with regular bm's.   Swallow/Nutrition/ Hydration             ADL's   Mod I overall, occasional supervision  Supervision to mod I  Cognitive retraining, ADL transfers, ADL retraining, functional activity tolerance   Mobility   Supervision-mod I overall  Supervision-mod I overall  d/c planning, HEP, patient/family education   Communication             Safety/Cognition/ Behavioral Observations  RW to bathroom with 1 assist.  Mod. I  Assess for changes in balance and coordination.   Pain   Denies pain.  Free from pain.  Assess for pain q shift prn.   Skin   Skin is clean dry and intact.  No skin breakdown.  Assess skin q shift and prn.     Discharge Planning:  Pt to discharge to home with her mother who will provide 24/7. Fam edu tomorrow (4/12) 1pm-3pm with her mother.   Team Discussion: Continuously check CBC's and bleeding times, no further GI  bleeding. Continent B/B, no complaints of pain or sleep issues. Stage 2 to the coccyx. Educating on proper nutrition. Mom to come in for family education. Patient on target to meet rehab goals: yes, occasional supervision with activity, mod I overall. HR gets up to 145 bpm with activity, and at rest goes back to 125 bpm. MD is aware. She should discharge at Mod I for household.   *See Care Plan and progress notes for long and short-term goals.   Revisions to Treatment Plan:  Not at this time.  Teaching Needs: Family education, medication management, pain management, skin/wound care, dressing changes, nutrition education, transfer training, gait training, balance training, endurance training, safety training.  Current Barriers to Discharge: Decreased caregiver support, Medical stability, Home enviroment access/layout, Wound care, Lack of/limited family support, Medication compliance, Behavior and Nutritional means  Possible Resolutions to Barriers: Continue current medications, offer nutritional supplements, provide emotional support.     Medical Summary Current Status: GIB with coauglopathy, fall in ED with [facility]. counts stable, no further gi bleeding  Barriers to Discharge: Medical stability   Possible Resolutions to [employer] Focus: daily assessment of labs, serial CBC's and bleeding times. skin care   Continued Need for Acute Rehabilitation Level of Care: The patient requires daily medical management by a physician with specialized training in physical medicine and rehabilitation for the following reasons: Direction of a multidisciplinary physical rehabilitation program to maximize functional independence :  Yes Medical management of patient stability for increased activity during participation in an intensive rehabilitation regime.: Yes Analysis of laboratory values and/or radiology reports with any subsequent need for medication adjustment and/or medical intervention. : Yes   I  attest that I was present, lead the team conference, and concur with the assessment and plan of the team.   Cristi Loron 06/24/2020, 2:45 PM

## 2020-06-24 NOTE — Progress Notes (Signed)
PROGRESS NOTE   Subjective/Complaints:  Overall doing ok. Happy to be going home soon. Therapy reports tachycardia with ambulation sometimes up to 140. Pt asymptomatic  ROS: Patient denies fever, rash, sore throat, blurred vision, nausea, vomiting, diarrhea, cough, shortness of breath or chest pain, joint or back pain, headache, or mood change.    Objective:   No results found. Recent Labs    06/23/20 0609  WBC 4.5  HGB 9.9*  HCT 31.8*  PLT 391   Recent Labs    06/23/20 1513  NA 133*  K 4.2  CL 99  CO2 25  GLUCOSE 114*  BUN 14  CREATININE 0.52  CALCIUM 9.1    Intake/Output Summary (Last 24 hours) at 06/24/2020 1138 Last data filed at 06/24/2020 0748 Gross per 24 hour  Intake 580 ml  Output --  Net 580 ml     Pressure Injury 06/16/20 Coccyx Medial Stage 2 -  Partial thickness loss of dermis presenting as a shallow open injury with a red, pink wound bed without slough. small area with broken skin (Active)  06/16/20 1530  Location: Coccyx  Location Orientation: Medial  Staging: Stage 2 -  Partial thickness loss of dermis presenting as a shallow open injury with a red, pink wound bed without slough.  Wound Description (Comments): small area with broken skin  Present on Admission: Yes    Physical Exam: Vital Signs Blood pressure 109/66, pulse 94, temperature 98.3 F (36.8 C), temperature source Oral, resp. rate 16, height 5\' 2"  (1.575 m), weight 40.2 kg, SpO2 99 %.  Constitutional: No distress . Vital signs reviewed. HEENT: EOMI, oral membranes moist Neck: supple Cardiovascular: RRR without murmur. No JVD    Respiratory/Chest: CTA Bilaterally without wheezes or rales. Normal effort    GI/Abdomen: BS +, non-tender, non-distended Ext: no clubbing, cyanosis, or edema Psych: pleasant, flat Skin: coccyx area dressed Neuro: Reasonable insight, memory, and awareness. Cranial nerves 2-12 are intact. Sensory  exam is normal. Reflexes are 1+ in all 4's. Fine motor coordination is intact. No tremors. Motor function is grossly 4+/5 in all 4's. Musculoskeletal: non-tender jts, RA jts     Assessment/Plan: 1. Functional deficits which require 3+ hours per day of interdisciplinary therapy in a comprehensive inpatient rehab setting.  Physiatrist is providing close team supervision and 24 hour management of active medical problems listed below.  Physiatrist and rehab team continue to assess barriers to discharge/monitor patient progress toward functional and medical goals  Care Tool:  Bathing    Body parts bathed by patient: Right arm,Left arm,Abdomen,Front perineal area,Chest,Buttocks,Right upper leg,Left upper leg,Face     Body parts n/a: Left lower leg,Right lower leg   Bathing assist Assist Level: Minimal Assistance - Patient > 75%     Upper Body Dressing/Undressing Upper body dressing   What is the patient wearing?: Hospital gown only,Pull over shirt    Upper body assist Assist Level: Supervision/Verbal cueing    Lower Body Dressing/Undressing Lower body dressing      What is the patient wearing?: Pants,Underwear/pull up     Lower body assist Assist for lower body dressing: Moderate Assistance - Patient 50 - 74%     Toileting  Toileting    Toileting assist Assist for toileting: Independent Assistive Device Comment: walker   Transfers Chair/bed transfer  Transfers assist     Chair/bed transfer assist level: Contact Guard/Touching assist     Locomotion Ambulation   Ambulation assist      Assist level: Supervision/Verbal cueing Assistive device: No Device Max distance: 252ft   Walk 10 feet activity   Assist     Assist level: Supervision/Verbal cueing Assistive device: No Device   Walk 50 feet activity   Assist    Assist level: Supervision/Verbal cueing Assistive device: No Device    Walk 150 feet activity   Assist Walk 150 feet activity did  not occur: Safety/medical concerns  Assist level: Supervision/Verbal cueing Assistive device: No Device    Walk 10 feet on uneven surface  activity   Assist Walk 10 feet on uneven surfaces activity did not occur: Safety/medical concerns         Wheelchair     Assist Will patient use wheelchair at discharge?: No   Wheelchair activity did not occur: N/A         Wheelchair 50 feet with 2 turns activity    Assist    Wheelchair 50 feet with 2 turns activity did not occur: N/A       Wheelchair 150 feet activity     Assist  Wheelchair 150 feet activity did not occur: N/A       Blood pressure 109/66, pulse 94, temperature 98.3 F (36.8 C), temperature source Oral, resp. rate 16, height 5\' 2"  (1.575 m), weight 40.2 kg, SpO2 99 %.  Medical Problem List and Plan: 1.   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 goals of supervision to modified independent.    -Continue CIR therapies including PT, OT  2. Antithrombotics: -DVT/anticoagulation:Mechanical:Sequential compression devices, below kneeBilateral lower extremities, INR 1.1  APTT 34--can dc daily checks -vitamin k completed 4/11 -antiplatelet therapy: N/A 3. Pain Management:Tramadol prn. Denies abdominal pain today. 4. Mood:LCSW to follow for evaluation and support. -antipsychotic agents: N/A 5. Neuropsych: This patientiscapable of making decisions onherown behalf. 6. Skin/Wound Care:Routine pressure relief measures. 7. Fluids/Electrolytes/Nutrition:encourage appropriate PO 8. TBI with SAH/IPH:  9. Anemia/pancytopenia:  .  --no gross blood clinically.  ----H/H trending up---9.9 4/11  -f/u as outpt with Dr. Irene Limbo 10. Severe fatty liver w/Abnormal LFTs and coagulopathy:Continue vitamin K  thorough 06/23/20.  --to transfuse prn Hgb<8 or symptomatic  --Liver biopsy 03/31 to rule of AIH (ANA+ and IgG elevated)   -bx with minimally active steatohepatitis  -outpt f/u wth Dr. Adriana Mccallum No abd pain or hepatomegaly 11. 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 Thrombocytopenia: Has resolved. --Monitor and transfuse prn Plt<50  4/11 plt counts climbing---391k today (see #9) 13. Adjustement disorder/depression?: On Remeron At bedtime.  14.Sick euthyroid: Continue Synthroid supplement. 15. Hypokalemia:Likely due to steroids and poor intake. --improved 4.2 4/11   Twenty  LOS: 8 days A FACE TO FACE EVALUATION WAS PERFORMED  Meredith Staggers 06/24/2020, 11:38 AM

## 2020-06-24 NOTE — Progress Notes (Signed)
Occupational Therapy Session Note  Patient Details  Name: Jessica Frazier MRN: [id number] Date of Birth: 2001/02/07  Today's Date: 06/24/2020 OT Individual Time: 0930-1000 OT Individual Time Calculation (min): 30 min    Short Term Goals: Week 1:  OT Short Term Goal 1 (Week 1): NA 2/2 ELOS  Skilled Therapeutic Interventions/Progress Updates:    Pt received supine with bed alarm going off, reporting she needs to use the bathroom. She completed ambulatory transfer and toileting tasks with mod I overall. She completed 125 ft of functional mobility to the therapy gym with distant supervision. Upon sitting for a rest break pt HR was 147 bpm. No associated s/s other than feeling like her "heart is racing". She took an extended rest break seated and her HR returned to 125 bpm. She completed 2 sets of endurance based activity with functional reaching. HR elevated with activity to 145 bpm. Consulted with MD during conference re tachycardia. Pt returned to her room and was left sitting up with all needs met.   Therapy Documentation Precautions:  Precautions Precautions: Fall Restrictions Weight Bearing Restrictions: No  Therapy/Group: Individual Therapy  Curtis Sites 06/24/2020, 12:11 PM

## 2020-06-24 NOTE — Progress Notes (Addendum)
Patient ID: Jessica Frazier, female   DOB: 08/06/2000, 19 y.o.   MRN: [id number]  SW met with pt and pt mother in room to provide updates from team conference on gains made, and d/c date 4/13. When discussing HHPT/OT recommendation, pt preferred HH is [facility].  SW called [facility] ([phone]) to discuss referral but they do not accept pt insurance.   Per notes, SW waiting on updates on if HH or outpatient will be recommended.   SW spoke with Will with [facility] (p:[phone] or [phone]/f:[phone]) to confirm if application received. Reports did not see, and asked for it be faxed again. SW faxed application.  *SW confirmed application received, but wrong application was sent, and pt would need to complete a new application before application can be processed. SW provided pt with application and encouraged to complete, and SW will fax tomorrow.   Auria Chamberlain, MSW, LCSWA Office: [phone] Cell: [phone] Fax: [phone] 

## 2020-06-24 NOTE — Progress Notes (Signed)
Occupational Therapy Session Note  Patient Details  Name: Jessica Frazier MRN: [id number] Date of Birth: [birth year]  Today's Date: 06/24/2020 OT Individual Time: 1130-1206 OT Individual Time Calculation (min): 36 min    Short Term Goals: Week 1:  OT Short Term Goal 1 (Week 1): NA 2/2 ELOS  Skilled Therapeutic Interventions/Progress Updates:    Pt received seated in recliner, denies pain, agreeable to therapy. Session focus on dynamic standing balance, functional cognition, and activity tolerance in prep for improved ADL/IADL performance. STS with ind throughout session. Amb to and from gym with distant S and no AD. Donned B shoes with set-up A. At [facility], completed 2 Memory tasks and trail making A and B with the following accuracies respectively: 100%, 85.7%, 2 errors, and 0 errors. Seated on yoga ball, completed 10 min of Wii sports game with good balance and problem solving of game instructions.   Pt left seated in recliner with chair alarm engaged, call bell in reach, and all immediate needs met.    Therapy Documentation Precautions:  Precautions Precautions: Fall Restrictions Weight Bearing Restrictions: No Pain: denies   ADL: See Care Tool for more details.   Therapy/Group: Individual Therapy  Volanda Napoleon MS, OTR/L  06/24/2020, 6:45 AM

## 2020-06-24 NOTE — Progress Notes (Signed)
Occupational Therapy Discharge Summary  Patient Details  Name: Jessica Frazier MRN: [id number] Date of Birth: 23-Apr-2000  Today's Date: 06/24/2020 OT Individual Time: 1400-1455 OT Individual Time Calculation (min): 55 min    Patient has met 12 of 12 long term goals due to improved activity tolerance, improved balance, postural control, ability to compensate for deficits, improved attention and improved awareness.  Patient to discharge at overall Modified Independent level.  Patient's care partner is independent to provide the necessary cognitive assistance at discharge. Pt's mother has completed family education. Pt has made great progress in CIR overall.   Reasons goals not met: All treatment goals met.   Recommendation:  Patient will benefit from ongoing skilled OT services in home health setting to continue to advance functional skills in the area of BADL, School/Education and Vocation.  Equipment: No equipment provided  Reasons for discharge: treatment goals met and discharge from hospital  Patient/family agrees with progress made and goals achieved: Yes   Skilled OT Intervention: Family education session completed with pt's mother present and actively involved. Pt completed toileting tasks with mod I. Shower transfer with mod I, edu and cueing for safety strategies and reduction of fall risk. HR before activity was 113 seated. Following shower HR 145 bpm. All dressing completed mod I. Cueing was required for appropriate use of rest breaks. Education provided re monitoring, follow up with MD, and need for rest breaks. Pt asking appropriate questions re health. Pt completed 200 ft of functional mobility to the ADL apt. She completed simulated tub transfer with shower chair with supervision. Pt returned to the therapy gym and completed brief BUE strengthening circuit with a 4lb dowel to challenge functional activity tolerance and overall strengthening. Pt returned to her room and  was left sitting EOB with her mother present.   OT Discharge Precautions/Restrictions  Precautions Precautions: Fall Restrictions Weight Bearing Restrictions: No   Vital Signs Therapy Vitals Temp: 98.3 F (36.8 C) Temp Source: Oral Pulse Rate: 94 Resp: 16 BP: 109/66 Patient Position (if appropriate): Lying Oxygen Therapy SpO2: 99 % ADL ADL Eating: Independent Where Assessed-Eating: Edge of bed Grooming: Independent Where Assessed-Grooming: Standing at sink Upper Body Bathing: Modified independent Where Assessed-Upper Body Bathing: Shower Lower Body Bathing: Modified independent Where Assessed-Lower Body Bathing: Shower Upper Body Dressing: Modified independent (Device) Where Assessed-Upper Body Dressing: Edge of bed Lower Body Dressing: Modified independent Where Assessed-Lower Body Dressing: Edge of bed Toileting: Modified independent Where Assessed-Toileting: Glass blower/designer: Diplomatic Services operational officer Method: Counselling psychologist: Raised toilet seat,Grab bars Social research officer, government: Modified independent Social research officer, government Method: Heritage manager: Civil engineer, contracting with back Vision Baseline Vision/History: Wears glasses Wears Glasses: At all times Patient Visual Report: No change from baseline Vision Assessment?: No apparent visual deficits Perception  Perception: Within Functional Limits Praxis Praxis: Intact Cognition Overall Cognitive Status: Within Functional Limits for tasks assessed Arousal/Alertness: Awake/alert Orientation Level: Oriented X4 Attention: Focused;Sustained Sustained Attention: Appears intact Memory: Appears intact Problem Solving: Impaired Problem Solving Impairment: Verbal complex;Functional complex Safety/Judgment: Appears intact Comments: Higher level deficits in complex planning and problem solving. Pt had little to no cognitive demands/challenges PTA so question  baseline Sensation Sensation Light Touch: Appears Intact Hot/Cold: Appears Intact Proprioception: Appears Intact Stereognosis: Appears Intact Coordination Gross Motor Movements are Fluid and Coordinated: Yes Fine Motor Movements are Fluid and Coordinated: Yes Finger Nose Finger Test: [facility] Motor  Motor Motor: Within Functional Limits Motor - Discharge Observations: Still limited by reduced endurance but overall  WFL Mobility  Bed Mobility Bed Mobility: Rolling Right;Supine to Sit;Sit to Supine Rolling Right: Independent Supine to Sit: Independent Sit to Supine: Independent Transfers Sit to Stand: Independent Stand to Sit: Independent  Trunk/Postural Assessment  Cervical Assessment Cervical Assessment: Within Functional Limits Thoracic Assessment Thoracic Assessment: Within Functional Limits Lumbar Assessment Lumbar Assessment: Within Functional Limits Postural Control Postural Control: Deficits on evaluation (righting reactions delayed)  Balance Balance Balance Assessed: Yes Static Sitting Balance Static Sitting - Balance Support: Bilateral upper extremity supported;Feet supported Static Sitting - Level of Assistance: 7: Independent Dynamic Sitting Balance Dynamic Sitting - Balance Support: Feet supported Dynamic Sitting - Level of Assistance: 7: Independent Static Standing Balance Static Standing - Balance Support: During functional activity;No upper extremity supported Static Standing - Level of Assistance: 7: Independent Dynamic Standing Balance Dynamic Standing - Balance Support: During functional activity Dynamic Standing - Level of Assistance: 6: Modified independent (Device/Increase time) Extremity/Trunk Assessment RUE Assessment RUE Assessment: Within Functional Limits General Strength Comments: Still limited by generalized weakness but overall 4-/5 LUE Assessment LUE Assessment: Within Functional Limits General Strength Comments: Still limited by  generalized weakness but overall 4-/5   Curtis Sites 06/24/2020, 6:22 AM

## 2020-06-24 NOTE — Progress Notes (Signed)
Physical Therapy Discharge Summary  Patient Details  Name: Jessica Frazier MRN: [id number] Date of Birth: Aug 07, 2000  Today's Date: 06/24/2020 PT Individual Time: 1300-1350 PT Individual Time Calculation (min): 50 min  Patient has met 10 of 10 long term goals due to improved activity tolerance, improved balance, improved postural control, increased strength, decreased pain and ability to compensate for deficits.  Patient to discharge at an ambulatory level Modified Independent for household mobility and supervision for community mobility.   Patient's care partner is independent to provide the necessary physical assistance at discharge.  Reasons goals not met: n/a  Recommendation:  Patient will benefit from ongoing skilled PT services in outpatient setting to continue to advance safe functional mobility, address ongoing impairments in strength, activity tolerance, balance, community mobility and integration, energy conservation strategies, patient/caregiver education, and minimize fall risk.  Equipment: No equipment provided  Reasons for discharge: treatment goals met  Patient/family agrees with progress made and goals achieved: Yes   Skilled Therapeutic Intervention: Patient in recliner in the room with her mother present for family education upon PT arrival. Patient alert and agreeable to PT session. Patient denied pain during session. Patient reported feeling confident and excited for d/c tomorrow. Patient's mother spoke limited [place], however, declined virtual interpretation and stated that the patient could provide interpretation during session.   Therapeutic Activity: Bed Mobility: Patient performed rolling R/L and supine to/from sit independently with increased time/effort in ADL bed to simulate home set-up. Patient demonstrated good recall of use of B elbows to push up to long sitting when performing supine>sit.   Transfers: Patient performed sit to/from stand from  various household surfaces (standard chair, arm chair, ADL couch, ADL bed, recliner) independnetly.  Discussed car transfer as performed yesterday, due to car simulator being occupied during session. Patient and mother denied questions or concerns based on discussion.   Gait Training:  Patient ambulated 100-200 feet x3 without an AD with supervision-mod I, supervision with fatigue. Ambulated as described below. Provided verbal cues for pacing and monitoring fatigue and HR. HR 91-111 with ambulation throughout session. Patient ascended/descended 12-6" steps using 1 rail with supervision. Performed reciprocal gait pattern. Provided cues for guarding technique.  6 Min Walk Test:  Instructed patient to ambulate as quickling and as safely as possible for 6 minutes using LRAD. Patient was allowed to take standing rest breaks without stopping the test, but if he required a sitting rest break the clock would be stopped and the test would be over.  Results: 847 feet, 258.2 meters, avg speed 0.72 m/s, RPE 5.5/10 and HR 111 bpm after Results indicate patient has decreased endurance with gait based on age matched norms.    Therapeutic Exercise: Patient performed the following exercises with verbal and tactile cues for proper technique. Access Code: [email or web address] Heel Raise - 2 x daily - 7 x weekly - 2 sets - 10 reps Standing Toe Raises at Chair - 2 x daily - 7 x weekly - 2 sets - 10 reps Single Leg Stance - 2 x daily - 7 x weekly - 1 sets - 5 reps - up to 30 sec hold Sit to Stand with Arms Crossed - 2 x daily - 7 x weekly - 2 sets - 10 reps -Patient Education: walking program 5 min/day progressing to 2x/day based on activity tolerance, then progressing by 1 min every 3 days.   Educated patient and her mother on symptoms of TBI and provided handouts in Spanish on headache, balance, and cognitive deficits  related to TBI. Reviewed handouts and instructed patient to write down symptoms occurrence and inform medical  team of changes and/or frequency of symptoms. Patient reporting intermittent headaches and increased mental fatigue and distractibility with daily tasks and predominant symptoms at this time. Also, educated on patient's progress and potential for increased activity level at d/c with continued therapy. Educated on benefits of exercise with use of energy conservation strategies with RA. Patient and her mother state that the patient is doing better than her baseline level and eager for her to continue making progress toward improved mobility and activity levels.   Patient performed a simulated ambulatory bathroom transfer and ambulated around the room independently during session. Cleared patient to be independent in the room. Educated on safety, use of non-skid socks or shoes and calling for assist when needed, patient stated understanding. LPN made aware after session.  Patient in recliner with her mother in the room at end of session with breaks locked and all needs within reach.   PT Discharge Precautions/Restrictions Precautions Precautions: Fall Restrictions Weight Bearing Restrictions: No Vision/Perception  Perception Perception: Within Functional Limits Praxis Praxis: Intact  Cognition Overall Cognitive Status: Within Functional Limits for tasks assessed Arousal/Alertness: Awake/alert Orientation Level: Oriented X4 Attention: Focused;Sustained Sustained Attention: Appears intact Memory: Appears intact Problem Solving: Impaired Problem Solving Impairment: Verbal complex;Functional complex Safety/Judgment: Appears intact Comments: Higher level deficits in complex planning and problem solving. Pt had little to no cognitive demands/challenges PTA so question baseline [employer].es Scales of Cognitive Functioning: Purposeful/appropriate Sensation Sensation Light Touch: Appears Intact Hot/Cold: Appears Intact Proprioception: Appears Intact Stereognosis: Appears  Intact Coordination Gross Motor Movements are Fluid and Coordinated: Yes Fine Motor Movements are Fluid and Coordinated: Yes Finger Nose Finger Test: [facility] Heel Shin Test: [facility] Motor  Motor Motor: Other (comment) Motor - Discharge Observations: Limited by reduced endurance and decreased postural cotrol with dynamic gait  Mobility Bed Mobility Bed Mobility: Rolling Right;Supine to Sit;Sit to Supine Rolling Right: Independent Supine to Sit: Independent Sit to Supine: Independent Transfers Transfers: Sit to Stand;Stand to Sit;Stand Pivot Transfers Sit to Stand: Independent Stand to Sit: Independent Stand Pivot Transfers: Independent Transfer (Assistive device): None Locomotion  Gait Ambulation: Yes Gait Assistance: Supervision/Verbal cueing;Independent (independent with distances up to 50 ft, supervision for distances >50 ft due to decreased endurance) Assistive device: None Gait Gait: Yes Gait Pattern: Impaired Gait Pattern: Decreased stride length;Decreased hip/knee flexion - right;Decreased hip/knee flexion - left;Decreased trunk rotation;Narrow base of support (decreased propulsion from terminal stance bilaterally) Gait velocity: decreased Stairs / Additional Locomotion Stairs: Yes Stairs Assistance: Supervision/Verbal cueing Stair Management Technique: One rail Left Number of Stairs: 12 Height of Stairs: 6 Ramp: Supervision/Verbal cueing Curb: Supervision/Verbal cueing Wheelchair Mobility Wheelchair Mobility: No  Trunk/Postural Assessment  Cervical Assessment Cervical Assessment: Within Functional Limits Thoracic Assessment Thoracic Assessment: Within Functional Limits Lumbar Assessment Lumbar Assessment: Within Functional Limits Postural Control Postural Control: Deficits on evaluation (righting reactions delayed)  Balance Balance Balance Assessed: Yes Standardized Balance Assessment Standardized Balance Assessment: Functional Gait Assessment Berg Balance  Test Sit to Stand: Able to stand without using hands and stabilize independently Standing Unsupported: Able to stand safely 2 minutes Sitting with Back Unsupported but Feet Supported on Floor or Stool: Able to sit safely and securely 2 minutes Stand to Sit: Sits safely with minimal use of hands Transfers: Able to transfer safely, minor use of hands Standing Unsupported with Eyes Closed: Able to stand 10 seconds safely Standing Ubsupported with Feet Together: Able to place feet  together independently and stand 1 minute safely From Standing, Reach Forward with Outstretched Arm: Can reach confidently >25 cm (10") From Standing Position, Pick up Object from Floor: Able to pick up shoe safely and easily From Standing Position, Turn to Look Behind Over each Shoulder: Looks behind from both sides and weight shifts well Turn 360 Degrees: Able to turn 360 degrees safely in 4 seconds or less Standing Unsupported, Alternately Place Feet on Step/Stool: Able to stand independently and safely and complete 8 steps in 20 seconds Standing Unsupported, One Foot in Front: Able to plae foot ahead of the other independently and hold 30 seconds Standing on One Leg: Tries to lift leg/unable to hold 3 seconds but remains standing independently Total Score: 52 Static Sitting Balance Static Sitting - Balance Support: Bilateral upper extremity supported;Feet supported Static Sitting - Level of Assistance: 7: Independent Dynamic Sitting Balance Dynamic Sitting - Balance Support: Feet supported Dynamic Sitting - Level of Assistance: 7: Independent Static Standing Balance Static Standing - Balance Support: During functional activity;No upper extremity supported Static Standing - Level of Assistance: 7: Independent Dynamic Standing Balance Dynamic Standing - Balance Support: During functional activity;No upper extremity supported Dynamic Standing - Level of Assistance: 7: Independent Functional Gait  Assessment Gait  Level Surface: Walks 20 ft in less than 7 sec but greater than 5.5 sec, uses assistive device, slower speed, mild gait deviations, or deviates 6-10 in outside of the 12 in walkway width. Change in Gait Speed: Makes only minor adjustments to walking speed, or accomplishes a change in speed with significant gait deviations, deviates 10-15 in outside the 12 in walkway width, or changes speed but loses balance but is able to recover and continue walking. Gait with Horizontal Head Turns: Performs head turns smoothly with slight change in gait velocity (eg, minor disruption to smooth gait path), deviates 6-10 in outside 12 in walkway width, or uses an assistive device. Gait with Vertical Head Turns: Performs task with slight change in gait velocity (eg, minor disruption to smooth gait path), deviates 6 - 10 in outside 12 in walkway width or uses assistive device Gait and Pivot Turn: Pivot turns safely within 3 sec and stops quickly with no loss of balance. Step Over Obstacle: Is able to step over 2 stacked shoe boxes taped together (9 in total height) without changing gait speed. No evidence of imbalance. Gait with Narrow Base of Support: Ambulates 7-9 steps. Gait with Eyes Closed: Walks 20 ft, uses assistive device, slower speed, mild gait deviations, deviates 6-10 in outside 12 in walkway width. Ambulates 20 ft in less than 9 sec but greater than 7 sec. Ambulating Backwards: Walks 20 ft, uses assistive device, slower speed, mild gait deviations, deviates 6-10 in outside 12 in walkway width. Steps: Alternating feet, must use rail. Total Score: 21 Extremity Assessment  RUE Assessment RUE Assessment: Within Functional Limits General Strength Comments: Still limited by generalized weakness but overall 4-/5 LUE Assessment LUE Assessment: Within Functional Limits General Strength Comments: Still limited by generalized weakness but overall 4-/5        Jaymien Landin L Lennon Richins PT, DPT  06/24/2020, 9:33  PM

## 2020-06-25 LAB — CBC
HCT: 34.2 % — ABNORMAL LOW (ref 36.0–46.0)
Hemoglobin: 11.1 g/dL — ABNORMAL LOW (ref 12.0–15.0)
MCH: 32.3 pg (ref 26.0–34.0)
MCHC: 32.5 g/dL (ref 30.0–36.0)
MCV: 99.4 fL (ref 80.0–100.0)
Platelets: 464 10*3/uL — ABNORMAL HIGH (ref 150–400)
RBC: 3.44 MIL/uL — ABNORMAL LOW (ref 3.87–5.11)
RDW: 18.4 % — ABNORMAL HIGH (ref 11.5–15.5)
WBC: 7.1 10*3/uL (ref 4.0–10.5)
nRBC: 0 % (ref 0.0–0.2)

## 2020-06-25 MED ORDER — VITAMIN D3 25 MCG PO TABS: 1000.0000 [IU] | ORAL_TABLET | Freq: Every day | ORAL | 0 refills | Status: DC

## 2020-06-25 MED ORDER — MIRTAZAPINE 30 MG PO TABS: 30.0000 mg | ORAL_TABLET | Freq: Every day | ORAL | 0 refills | Status: DC

## 2020-06-25 MED ORDER — POTASSIUM CHLORIDE CRYS ER 20 MEQ PO TBCR: 20.0000 meq | EXTENDED_RELEASE_TABLET | Freq: Two times a day (BID) | ORAL | 0 refills | Status: DC

## 2020-06-25 MED ORDER — PREDNISONE 10 MG PO TABS: 30.0000 mg | ORAL_TABLET | Freq: Every day | ORAL | 0 refills | Status: DC

## 2020-06-25 MED ORDER — LEVOTHYROXINE SODIUM 75 MCG PO TABS: 75.0000 ug | ORAL_TABLET | Freq: Every day | ORAL | 0 refills | Status: DC

## 2020-06-25 MED ORDER — THIAMINE HCL 100 MG PO TABS: 100.0000 mg | ORAL_TABLET | Freq: Every day | ORAL | 0 refills | Status: DC

## 2020-06-25 MED ORDER — ZINC SULFATE 220 (50 ZN) MG PO CAPS: 220.0000 mg | ORAL_CAPSULE | Freq: Every day | ORAL | 0 refills | Status: DC

## 2020-06-25 NOTE — Progress Notes (Signed)
Patient ID: Jessica Frazier, female   DOB: May 24, 2000, 20 y.o.   MRN: [id number]  SW met with pt and pt mother to discuss outpatient therapy since pt progression here in rehab. Amenable to any outpatient location in [facility]. No preference. SW received completed application for   SW faxed outpatient PT/OT referral to [facility] ([phone]/f:[phone]).  SW faxed new patient application to [facility] (p:[phone] or [phone]/[phone]).  *SW confirms application was received.   Loralee Pacas, MSW, [place] Office: [phone] Cell: [phone] Fax: [phone]

## 2020-06-25 NOTE — Progress Notes (Signed)
PROGRESS NOTE   Subjective/Complaints:  No problems overnight. Anxious to get home!   ROS: Patient denies fever, rash, sore throat, blurred vision, nausea, vomiting, diarrhea, cough, shortness of breath or chest pain, joint or back pain, headache, or mood change.    Objective:   No results found. Recent Labs    06/23/20 0609  WBC 4.5  HGB 9.9*  HCT 31.8*  PLT 391   Recent Labs    06/23/20 1513  NA 133*  K 4.2  CL 99  CO2 25  GLUCOSE 114*  BUN 14  CREATININE 0.52  CALCIUM 9.1    Intake/Output Summary (Last 24 hours) at 06/25/2020 6387 Last data filed at 06/24/2020 1901 Gross per 24 hour  Intake 480 ml  Output --  Net 480 ml     Pressure Injury 06/16/20 Coccyx Medial Stage 2 -  Partial thickness loss of dermis presenting as a shallow open injury with a red, pink wound bed without slough. small area with broken skin (Active)  06/16/20 1530  Location: Coccyx  Location Orientation: Medial  Staging: Stage 2 -  Partial thickness loss of dermis presenting as a shallow open injury with a red, pink wound bed without slough.  Wound Description (Comments): small area with broken skin  Present on Admission: Yes    Physical Exam: Vital Signs Blood pressure 125/81, pulse 85, temperature 98.2 F (36.8 C), temperature source Oral, resp. rate 16, height 5\' 2"  (1.575 m), weight 43.3 kg, SpO2 100 %.  Constitutional: No distress . Vital signs reviewed. HEENT: EOMI, oral membranes moist Neck: supple Cardiovascular: RRR without murmur. No JVD    Respiratory/Chest: CTA Bilaterally without wheezes or rales. Normal effort    GI/Abdomen: BS +, non-tender, non-distended Ext: no clubbing, cyanosis, or edema Psych: pleasant and cooperative Skin: coccyx area healing Neuro: Reasonable insight, memory, and awareness. Cranial nerves 2-12 are intact. Sensory exam is normal. Reflexes are 1+ in all 4's. Fine motor coordination is  intact. No tremors. Motor function is grossly 4+/5 in all 4's. Musculoskeletal: non-tender jts, RA jts     Assessment/Plan: 1. Functional deficits which require 3+ hours per day of interdisciplinary therapy in a comprehensive inpatient rehab setting.  Physiatrist is providing close team supervision and 24 hour management of active medical problems listed below.  Physiatrist and rehab team continue to assess barriers to discharge/monitor patient progress toward functional and medical goals  Care Tool:  Bathing    Body parts bathed by patient: Right arm,Left arm,Abdomen,Front perineal area,Chest,Buttocks,Right upper leg,Left upper leg,Face,Right lower leg,Left lower leg     Body parts n/a: Left lower leg,Right lower leg   Bathing assist Assist Level: Independent with assistive device     Upper Body Dressing/Undressing Upper body dressing   What is the patient wearing?: Pull over shirt,Bra    Upper body assist Assist Level: Independent    Lower Body Dressing/Undressing Lower body dressing      What is the patient wearing?: Pants,Underwear/pull up     Lower body assist Assist for lower body dressing: Independent     Toileting Toileting    Toileting assist Assist for toileting: Independent with assistive device Assistive Device Comment: walker  Transfers Chair/bed transfer  Transfers assist     Chair/bed transfer assist level: Independent     Locomotion Ambulation   Ambulation assist      Assist level: Supervision/Verbal cueing Assistive device: No Device Max distance: 847 ft   Walk 10 feet activity   Assist     Assist level: Independent Assistive device: No Device   Walk 50 feet activity   Assist    Assist level: Independent Assistive device: No Device    Walk 150 feet activity   Assist Walk 150 feet activity did not occur: Safety/medical concerns  Assist level: Supervision/Verbal cueing Assistive device: No Device    Walk 10  feet on uneven surface  activity   Assist Walk 10 feet on uneven surfaces activity did not occur: Safety/medical concerns   Assist level: Supervision/Verbal cueing     Wheelchair     Assist Will patient use wheelchair at discharge?: No   Wheelchair activity did not occur: N/A         Wheelchair 50 feet with 2 turns activity    Assist    Wheelchair 50 feet with 2 turns activity did not occur: N/A       Wheelchair 150 feet activity     Assist  Wheelchair 150 feet activity did not occur: N/A       Blood pressure 125/81, pulse 85, temperature 98.2 F (36.8 C), temperature source Oral, resp. rate 16, height 5\' 2"  (1.575 m), weight 43.3 kg, SpO2 100 %.  Medical Problem List and Plan: 1.   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  2. Antithrombotics: -DVT/anticoagulation:Mechanical:Sequential compression devices, below kneeBilateral lower extremities, INR 1.1  APTT 34--can dc daily checks -vitamin k completed 4/11 -antiplatelet therapy: N/A 3. Pain Management:Tramadol prn. Denies abdominal pain today. 4. Mood:LCSW to follow for evaluation and support. -antipsychotic agents: N/A 5. Neuropsych: This patientiscapable of making decisions onherown behalf. 6. Skin/Wound Care:Routine pressure relief measures. 7. Fluids/Electrolytes/Nutrition:encourage appropriate PO 8. TBI with SAH/IPH:  9. Anemia/pancytopenia:  .  --no gross blood clinically.  ----H/H trending up---9.9 4/11  -f/u as outpt with Dr. Irene Limbo  -labs pending for 4/13 10. Severe fatty liver w/Abnormal LFTs and coagulopathy:Continue vitamin K thorough 06/23/20.  --to transfuse prn Hgb<8 or symptomatic  --Liver biopsy 03/31 to  rule of AIH (ANA+ and IgG elevated)   -bx with minimally active steatohepatitis  -outpt f/u wth Dr. Adriana Mccallum No abd pain or hepatomegaly 11. Seronegative RA/Lupus: On prednisone 40 mg day---  -I spoke with Marella Chimes [facility] with [place] [facility] who recommended reducing prednisone to 30mg  daily until she sees pt on Tuesday 07/08/20 at 11:45AM 12. Thrombocytopenia: Has resolved. --Monitor and transfuse prn Plt<50  4/11 plt counts climbing---391k today (see #9) 13. Adjustement disorder/depression?: On Remeron At bedtime.  14.Sick euthyroid: Continue Synthroid supplement. 15. Hypokalemia:Likely due to steroids and poor intake. --improved 4.2 4/11   Twenty  LOS: 9 days A FACE TO FACE EVALUATION WAS PERFORMED  Jessica Frazier 06/25/2020, 8:19 AM

## 2020-06-25 NOTE — Discharge Instructions (Signed)
   Inpatient Rehab Discharge Instructions  Jessica Frazier Discharge date and time: 06/25/20    Activities/Precautions/ Functional Status: Activity: no lifting, driving, or strenuous exercise  till cleared by MD Diet: regular diet Wound Care: none needed    Functional status:  ___ No restrictions     ___ Walk up steps independently ___ 24/7 supervision/assistance   ___ Walk up steps with assistance _X_ Intermittent supervision/assistance  ___ Bathe/dress independently ___ Walk with walker     ___ Bathe/dress with assistance ___ Walk Independently    ___ Shower independently ___ Walk with assistance    ___ Shower with assistance _X__ No alcohol     ___ Return to work/school ________  Special Instructions: 1. Needs supervision when out in community setting. No driving'   My questions have been answered and I understand these instructions. I will adhere to these goals and the provided educational materials after my discharge from the hospital.  Patient/Caregiver Signature _______________________________ Date __________  Clinician Signature _______________________________________ Date __________  Please bring this form and your medication list with you to all your follow-up doctor's appointments. COMMUNITY REFERRALS UPON DISCHARGE:     Outpatient: PT     OT             Agency: [facility]  Phone: [phone]             Appointment Date/Time:*Please expect follow-up within 7-10 business days to schedule the appointment. If you have not received follow-up, be sure to contact the site directly.   Medical Equipment/Items Ordered:None recommended                                                 Agency/Supplier:N/A  GENERAL COMMUNITY RESOURCES FOR PATIENT/FAMILY: Be sure to follow-up with  [facility] (p:[phone] or [phone]/R:[phone]) about your application status and scheduling an appointment.

## 2020-06-25 NOTE — Progress Notes (Signed)
Patient discharged home in private car with family member (mom) with no issues noted. Noon medications administered prior to discharge. Patient anxious to get home. Provider went over discharge orders and instructions with patient. No complains of pain or discomfort. All questions answered + belongings sent home with patient.

## 2020-06-27 ENCOUNTER — Telehealth: Payer: Self-pay | Admitting: Hematology

## 2020-06-27 NOTE — Telephone Encounter (Signed)
Received a referral from the hospital for [name] to f/u w/dr. Irene Limbo. Pt has been cld and scheduled to see Dr. Irene Limbo on 5/2 at 1pm. Pt aware to arrive 20 minutes early.

## 2020-07-09 ENCOUNTER — Encounter: Payer: 59 | Admitting: Registered Nurse

## 2020-07-10 ENCOUNTER — Encounter: Payer: 59 | Attending: Registered Nurse | Admitting: Registered Nurse

## 2020-07-10 ENCOUNTER — Encounter: Payer: Self-pay | Admitting: Registered Nurse

## 2020-07-10 ENCOUNTER — Other Ambulatory Visit: Payer: Self-pay

## 2020-07-10 VITALS — BP 117/80 | HR 92 | Temp 98.5°F | Ht 62.0 in | Wt 100.6 lb

## 2020-07-10 DIAGNOSIS — E039 Hypothyroidism, unspecified: Secondary | ICD-10-CM | POA: Diagnosis present

## 2020-07-10 DIAGNOSIS — I609 Nontraumatic subarachnoid hemorrhage, unspecified: Secondary | ICD-10-CM | POA: Diagnosis present

## 2020-07-10 NOTE — Progress Notes (Signed)
Subjective:    Patient ID: [name], female    DOB: [birth year], 20 y.o.   MRN: [id number]  HPI: Ravneet Spilker is a 20 y.o. female who is here for hospital follow up appointment of her [facility] and Acquired Hypothyroidism. She presented to [facility] on 06/02/2020 with complaints of abdominal pain and dark stools for 4 days. She also yustain a fall in the emergency room and was found to have a Acute SAH. Neurosurgery was consulted. Dr Arnoldo Morale recommend conservative care.  CT Head WO Contrast:  IMPRESSION: 1. Multifocal acute subarachnoid blood, greatest over the superior parietal lobes. 2. Large left parietal scalp hematoma without skull fracture.  Gastroenterology and oncology  was also consulted: see [name] Pa-C discharge summary for details.   Ms. Pershing Cox was admitted to inpatient rehabilitation on 06/16/2020 and discharged home on 06/24/2020. She is receiving outpatient therapy at [facility]. She denies any pain. She rated her pain 3 on Health and History. Also reports she has a good appetite.    Pain Inventory Average Pain 0 Pain Right Now 3 My pain is intermittent and sharp  LOCATION OF PAIN  Headache BOWEL Number of stools per week: 14 or more Oral laxative use No  Type of laxative  none Enema or suppository use No  History of colostomy No  Incontinent No   BLADDER Normal In and out cath, frequency N/A Able to self cath No  Bladder incontinence No  Frequent urination No  Leakage with coughing No  Difficulty starting stream No  Incomplete bladder emptying No    Mobility how many minutes can you walk? Unsure. Patient has balance issues. ability to climb steps?  yes do you drive?  no  Function employed # of hrs/week Currently not working.  Neuro/Psych No problems in this area confusion  Prior Studies Any changes since last visit?  no New Patient  Physicians involved in your care Any changes since last visit?   no New Patient   Family History  Problem Relation Age of Onset  . Graves' disease Mother   . Healthy Father   . Diabetes Maternal Grandfather    Social History   Socioeconomic History  . Marital status: Single    Spouse name: Not on file  . Number of children: Not on file  . Years of education: Not on file  . Highest education level: Not on file  Occupational History  . Not on file  Tobacco Use  . Smoking status: Never Smoker  . Smokeless tobacco: Never Used  Vaping Use  . Vaping Use: Never used  Substance and Sexual Activity  . Alcohol use: Not Currently  . Drug use: Never  . Sexual activity: Not on file  Other Topics Concern  . Not on file  Social History Narrative  . Not on file   Social Determinants of Health   Financial Resource Strain: Not on file  Food Insecurity: Not on file  Transportation Needs: Not on file  Physical Activity: Not on file  Stress: Not on file  Social Connections: Not on file   History reviewed. No pertinent surgical history. Past Medical History:  Diagnosis Date  . Bell's palsy   . Hypothyroidism   . Rheumatoid arthritis (HCC)    BP 117/80   Pulse 92   Temp 98.5 F (36.9 C)   Ht 5\' 2"  (1.575 m)   Wt 100 lb 9.6 oz (45.6 kg)   LMP 06/13/2020   SpO2 99%  BMI 18.40 kg/m   Opioid Risk Score:   Fall Risk Score:  `1  Depression screen PHQ 2/9  Depression screen PHQ 2/9 07/10/2020  Decreased Interest 0  Down, Depressed, Hopeless 0  PHQ - 2 Score 0  Altered sleeping 1  Tired, decreased energy 0  Change in appetite 0  Feeling bad or failure about yourself  0  Trouble concentrating 0  Moving slowly or fidgety/restless 0  Suicidal thoughts 0  PHQ-9 Score 1   Review of Systems  Neurological: Positive for headaches.  Psychiatric/Behavioral: Positive for confusion.  All other systems reviewed and are negative.      Objective:   Physical Exam Vitals and nursing note reviewed.  Constitutional:      Appearance: Normal  appearance.  Cardiovascular:     Rate and Rhythm: Normal rate and regular rhythm.     Pulses: Normal pulses.     Heart sounds: Normal heart sounds.  Pulmonary:     Effort: Pulmonary effort is normal.     Breath sounds: Normal breath sounds.  Musculoskeletal:     Cervical back: Normal range of motion and neck supple.     Comments: Normal Muscle Bulk and Muscle Testing Reveals:  Upper Extremities: Full ROM and Muscle Strength 5/5 Lower Extremities: Full ROM and Muscle Strength 5/5 Arises from chair with ease Narrow Based  Gait   Skin:    General: Skin is warm and dry.  Neurological:     Mental Status: She is alert and oriented to person, place, and time.  Psychiatric:        Mood and Affect: Mood normal.           Assessment & Plan:  1. SAH : Continue [facility] Therapy. PCP Following. Continue to Monitor.  2.  Acquired Hypothyroidism.Continue current medication regimen. PCP following. Continue to Monitor.   F/U with Dr Naaman Plummer in 4-6 weeks

## 2020-07-12 NOTE — Progress Notes (Signed)
HEMATOLOGY/ONCOLOGY CONSULTATION NOTE  Date of Service: 07/14/2020  Patient Care Team: Pcp, No as PCP - General  CHIEF COMPLAINTS/PURPOSE OF CONSULTATION:  Coagulopathy  HISTORY OF PRESENTING ILLNESS:   Jessica Frazier is a wonderful 20 y.o. female who was seen by [place] in the hospital is here for f/u for evaluation and management of coagulopathy. The pt reports that she is doing well overall.  The pt reports that she hs been doing better since being seen at the hospital. The pt notes that she still does not have a PCP and denies any history of having a PCP. The pt notes that she has seen her Rheumatologist around two weeks ago since being hospitalized. The pt notes that she is currently on 20 mg Prednisone in a taper where she will decrease 5 mg weekly. The pt believes that her Rheumatologist now believes that it is no longer RA and it is Lupus. The pt has a f/u on May 25th and the pt will be getting fluids once monthly. The pt will be started on new medications then, but the pt is unaware of what she will be started on. The pt notes that she is taking her thyroid medications regularly.  On review of systems, pt reports consistent lump on head due to fall and denies abdominal pain, headaches, weight loss,  and any other symptoms.  MEDICAL HISTORY:  Past Medical History:  Diagnosis Date  . Bell's palsy   . Hypothyroidism   . Rheumatoid arthritis ([place])     SURGICAL HISTORY: No past surgical history on file.  SOCIAL HISTORY: Social History   Socioeconomic History  . Marital status: Single    Spouse name: Not on file  . Number of children: Not on file  . Years of education: Not on file  . Highest education level: Not on file  Occupational History  . Not on file  Tobacco Use  . Smoking status: Never Smoker  . Smokeless tobacco: Never Used  Vaping Use  . Vaping Use: Never used  Substance and Sexual Activity  . Alcohol use: Not Currently  . Drug use: Never  . Sexual  activity: Not on file  Other Topics Concern  . Not on file  Social History Narrative  . Not on file   Social Determinants of Health   Financial Resource Strain: Not on file  Food Insecurity: Not on file  Transportation Needs: Not on file  Physical Activity: Not on file  Stress: Not on file  Social Connections: Not on file  Intimate Partner Violence: Not on file    FAMILY HISTORY: Family History  Problem Relation Age of Onset  . Graves' disease Mother   . Healthy Father   . Diabetes Maternal Grandfather     ALLERGIES:  is allergic to other and rinvoq [upadacitinib].  MEDICATIONS:  Current Outpatient Medications  Medication Sig Dispense Refill  . B Complex-C (B-COMPLEX WITH VITAMIN C) tablet Take 1 tablet by mouth daily. 30 tablet 11  . cholecalciferol (VITAMIN D) 25 MCG tablet Take 1 tablet (1,000 Units total) by mouth daily. 30 tablet 0  . CVS D3 25 MCG (1000 UT) capsule Take 1,000 Units by mouth daily.    . hydroxypropyl methylcellulose / hypromellose (ISOPTO TEARS / GONIOVISC) 2.5 % ophthalmic solution Place 1 drop into both eyes 3 (three) times daily. 15 mL 12  . levothyroxine (SYNTHROID) 75 MCG tablet Take 1 tablet (75 mcg total) by mouth daily. 30 tablet 0  . mirtazapine (REMERON) 30  MG tablet Take 1 tablet (30 mg total) by mouth at bedtime. 30 tablet 0  . Multiple Vitamin (MULTIVITAMIN WITH MINERALS) TABS tablet Take 1 tablet by mouth daily. 30 tablet 11  . potassium chloride SA (KLOR-CON) 20 MEQ tablet Take 1 tablet (20 mEq total) by mouth 2 (two) times daily. 60 tablet 0  . predniSONE (DELTASONE) 10 MG tablet Take 3 tablets (30 mg total) by mouth daily with breakfast. 90 tablet 0  . thiamine (VITAMIN B-1) 100 MG tablet Take 100 mg by mouth daily.    Marland Kitchen zinc sulfate 220 (50 Zn) MG capsule Take 1 capsule (220 mg total) by mouth daily. 30 capsule 0   No current facility-administered medications for this visit.    REVIEW OF SYSTEMS:   10 Point review of Systems was  done is negative except as noted above.  PHYSICAL EXAMINATION: ECOG PERFORMANCE STATUS: 1 - Symptomatic but completely ambulatory  . Vitals:   07/14/20 1300  BP: 122/73  Pulse: 77  Resp: 16  Temp: 97.9 F (36.6 C)  SpO2: 100%   Filed Weights   07/14/20 1300  Weight: 101 lb 14.4 oz (46.2 kg)   .Body mass index is 18.64 kg/m.   GENERAL:alert, in no acute distress and comfortable SKIN: no acute rashes, no significant lesions EYES: conjunctiva are pink and non-injected, sclera anicteric OROPHARYNX: MMM, no exudates, no oropharyngeal erythema or ulceration NECK: supple, no JVD LYMPH:  no palpable lymphadenopathy in the cervical, axillary or inguinal regions LUNGS: clear to auscultation b/l with normal respiratory effort HEART: regular rate & rhythm ABDOMEN:  normoactive bowel sounds , non tender, not distended. Extremity: no pedal edema PSYCH: alert & oriented x 3 with fluent speech NEURO: no focal motor/sensory deficits  LABORATORY DATA:  I have reviewed the data as listed  . CBC Latest Ref Rng & Units 07/14/2020 06/25/2020 06/23/2020  WBC 4.0 - 10.5 K/uL 5.5 7.1 4.5  Hemoglobin 12.0 - 15.0 g/dL 10.8(L) 11.1(L) 9.9(L)  Hematocrit 36.0 - 46.0 % 34.1(L) 34.2(L) 31.8(L)  Platelets 150 - 400 K/uL 319 464(H) 391    . CMP Latest Ref Rng & Units 07/14/2020 06/23/2020 06/17/2020  Glucose 70 - 99 mg/dL 78 114(H) 85  BUN 6 - 20 mg/dL 12 14 10   Creatinine 0.44 - 1.00 mg/dL 0.60 0.52 0.35(L)  Sodium 135 - 145 mmol/L 142 133(L) 136  Potassium 3.5 - 5.1 mmol/L 3.7 4.2 3.7  Chloride 98 - 111 mmol/L 104 99 104  CO2 22 - 32 mmol/L 27 25 25   Calcium 8.9 - 10.3 mg/dL 9.4 9.1 8.5(L)  Total Protein 6.5 - 8.1 g/dL 8.4(H) - -  Total Bilirubin 0.3 - 1.2 mg/dL 0.5 - -  Alkaline Phos 38 - 126 U/L 102 - -  AST 15 - 41 U/L 56(H) - -  ALT 0 - 44 U/L 58(H) - -   . Lab Results  Component Value Date   IRON 28 06/08/2020   TIBC 192 (L) 06/08/2020   IRONPCTSAT 15 06/08/2020   (Iron and  TIBC)  Lab Results  Component Value Date   FERRITIN 541 (H) 06/08/2020   Component     Latest Ref Rng & Units 07/14/2020  Prothrombin Time     11.4 - 15.2 seconds 13.1  INR     0.8 - 1.2 1.0  Fibrinogen     210 - 475 mg/dL 322  APTT     24 - 36 seconds 31     RADIOGRAPHIC STUDIES: I have personally reviewed  the radiological images as listed and agreed with the findings in the report. No results found.  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 PT, fibriongen and aPTT wnl today.  2) Thrombocytopenia - due to liver disease and ?ITP- now resolved.  PLAN: -Advised pt that when she was in the hospital she was very Vitamin K deficient. -Advised pt her primary needs at this time are monitoring the rheumatoid issues with Dr. Pearline Cables. -Continue to f/u w Endocrinology and GI as scheduled. -Continue to f/u w Dr. Pearline Cables for RA/Lupus as scheduled. -Advised pt she may want to get a PCP to monitor all cares needed. Recommended  group.  -Recommended pt continue to eat and drink well. Increase caloric intake to continue to gain weight. -Will get labs today. -Will see back as needed based on lab results.  FOLLOW UP: Labs today Clinic f/u based on lab results   All of the patients questions were answered with apparent satisfaction. The patient knows to call the clinic with any problems, questions or concerns.  I spent 30 minutes counseling the patient face to face. The total time spent in the appointment was 40 minutes and more than 50% was on counseling and direct patient cares.    Sullivan Lone MD [place] AAHIVMS [facility] Hematology/Oncology Physician [facility]  (Office):       [phone] (Work cell):  [phone] (Fax):           [phone]  07/14/2020 1:22 PM  I, Reinaldo Raddle, am acting as scribe for Dr. Sullivan Lone, MD.  .I have reviewed the above documentation for accuracy  and completeness, and I agree with the above. Brunetta Genera MD   ADDENDUM  Component     Latest Ref Rng & Units 07/14/2020  Prothrombin Time     11.4 - 15.2 seconds 13.1  INR     0.8 - 1.2 1.0  Fibrinogen     210 - 475 mg/dL 322  APTT     24 - 36 seconds 31   -coags- wnl  . CBC    Component Value Date/Time   WBC 5.5 07/14/2020 1335   RBC 3.46 (L) 07/14/2020 1335   HGB 10.8 (L) 07/14/2020 1335   HCT 34.1 (L) 07/14/2020 1335   PLT 319 07/14/2020 1335   MCV 98.6 07/14/2020 1335   MCH 31.2 07/14/2020 1335   MCHC 31.7 07/14/2020 1335   RDW 16.7 (H) 07/14/2020 1335   LYMPHSABS 2.1 07/14/2020 1335   MONOABS 0.3 07/14/2020 1335   EOSABS 0.0 07/14/2020 1335   BASOSABS 0.0 07/14/2020 1335   -PLT WNL  Plan -continue f/u with rheumatology, GI and setup PCP -no further hematology evaluation recommended at this time.  Brunetta Genera MD

## 2020-07-14 ENCOUNTER — Encounter: Payer: Self-pay | Admitting: Registered Nurse

## 2020-07-14 ENCOUNTER — Other Ambulatory Visit: Payer: Self-pay

## 2020-07-14 ENCOUNTER — Inpatient Hospital Stay: Payer: 59 | Attending: Hematology | Admitting: Hematology

## 2020-07-14 ENCOUNTER — Inpatient Hospital Stay: Payer: 59

## 2020-07-14 VITALS — BP 122/73 | HR 77 | Temp 97.9°F | Resp 16 | Ht 62.0 in | Wt 101.9 lb

## 2020-07-14 DIAGNOSIS — Z79899 Other long term (current) drug therapy: Secondary | ICD-10-CM | POA: Diagnosis not present

## 2020-07-14 DIAGNOSIS — D689 Coagulation defect, unspecified: Secondary | ICD-10-CM

## 2020-07-14 DIAGNOSIS — E561 Deficiency of vitamin K: Secondary | ICD-10-CM | POA: Insufficient documentation

## 2020-07-14 DIAGNOSIS — D6959 Other secondary thrombocytopenia: Secondary | ICD-10-CM | POA: Insufficient documentation

## 2020-07-14 DIAGNOSIS — D696 Thrombocytopenia, unspecified: Secondary | ICD-10-CM

## 2020-07-14 DIAGNOSIS — E876 Hypokalemia: Secondary | ICD-10-CM | POA: Insufficient documentation

## 2020-07-14 LAB — CBC WITH DIFFERENTIAL/PLATELET
Abs Immature Granulocytes: 0.06 10*3/uL (ref 0.00–0.07)
Basophils Absolute: 0 10*3/uL (ref 0.0–0.1)
Basophils Relative: 0 %
Eosinophils Absolute: 0 10*3/uL (ref 0.0–0.5)
Eosinophils Relative: 1 %
HCT: 34.1 % — ABNORMAL LOW (ref 36.0–46.0)
Hemoglobin: 10.8 g/dL — ABNORMAL LOW (ref 12.0–15.0)
Immature Granulocytes: 1 %
Lymphocytes Relative: 38 %
Lymphs Abs: 2.1 10*3/uL (ref 0.7–4.0)
MCH: 31.2 pg (ref 26.0–34.0)
MCHC: 31.7 g/dL (ref 30.0–36.0)
MCV: 98.6 fL (ref 80.0–100.0)
Monocytes Absolute: 0.3 10*3/uL (ref 0.1–1.0)
Monocytes Relative: 5 %
Neutro Abs: 3 10*3/uL (ref 1.7–7.7)
Neutrophils Relative %: 55 %
Platelets: 319 10*3/uL (ref 150–400)
RBC: 3.46 MIL/uL — ABNORMAL LOW (ref 3.87–5.11)
RDW: 16.7 % — ABNORMAL HIGH (ref 11.5–15.5)
WBC: 5.5 10*3/uL (ref 4.0–10.5)
nRBC: 0 % (ref 0.0–0.2)

## 2020-07-14 LAB — CMP (CANCER CENTER ONLY)
ALT: 58 U/L — ABNORMAL HIGH (ref 0–44)
AST: 56 U/L — ABNORMAL HIGH (ref 15–41)
Albumin: 3.4 g/dL — ABNORMAL LOW (ref 3.5–5.0)
Alkaline Phosphatase: 102 U/L (ref 38–126)
Anion gap: 11 (ref 5–15)
BUN: 12 mg/dL (ref 6–20)
CO2: 27 mmol/L (ref 22–32)
Calcium: 9.4 mg/dL (ref 8.9–10.3)
Chloride: 104 mmol/L (ref 98–111)
Creatinine: 0.6 mg/dL (ref 0.44–1.00)
GFR, Estimated: >60 mL/min (ref >60)
Glucose, Bld: 78 mg/dL (ref 70–99)
Potassium: 3.7 mmol/L (ref 3.5–5.1)
Sodium: 142 mmol/L (ref 135–145)
Total Bilirubin: 0.5 mg/dL (ref 0.3–1.2)
Total Protein: 8.4 g/dL — ABNORMAL HIGH (ref 6.5–8.1)

## 2020-07-14 LAB — FIBRINOGEN: Fibrinogen: 322 mg/dL (ref 210–475)

## 2020-07-14 LAB — PROTIME-INR
INR: 1 (ref 0.8–1.2)
Prothrombin Time: 13.1 seconds (ref 11.4–15.2)

## 2020-07-14 LAB — APTT: aPTT: 31 seconds (ref 24–36)

## 2020-07-17 ENCOUNTER — Other Ambulatory Visit: Payer: Self-pay | Admitting: Physical Medicine and Rehabilitation

## 2020-07-21 NOTE — Progress Notes (Signed)
Virtual Visit via Telephone Note  I connected with Jessica Frazier, on 07/22/2020 at 2:35 PM by telephone due to the COVID-19 pandemic and verified that I am speaking with the correct person using two identifiers.  Due to current restrictions/limitations of in-office visits due to the COVID-19 pandemic, this scheduled clinical appointment was converted to a telehealth visit.   Consent: I discussed the limitations, risks, security and privacy concerns of performing an evaluation and management service by telephone and the availability of in person appointments. I also discussed with the patient that there may be a patient responsible charge related to this service. The patient expressed understanding and agreed to proceed.   Location of Patient: Home  Location of Provider: [facility] [place] participating in Telemedicine visit: [place], NP Elmon Else, [place]  History of Present Illness: Jessica Frazier. Jessica Frazier is a 20 year-old female who presents to establish care. PMH significant for hepatic steatosis, acquired hypothyroidism, Hashimoto's thyroiditis, intracerebral hemorrhage, subarachnoid hemorrhage, traumatic brain injury, arthropathy, rheumatoid arthritis, coagulopathy, hypofibrinogenemia, anemia, elevated LFT's, and thrombocytopenia.   Current issues and/or concerns: None   Past Medical History:  Diagnosis Date  . Anemia    Phreesia 07/22/2020  . Arthritis    Phreesia 07/22/2020  . Bell's palsy   . Hypothyroidism   . Rheumatoid arthritis ([place])   . Thyroid disease    Phreesia 07/22/2020   Allergies  Allergen Reactions  . Other Diarrhea and Other (See Comments)    Omega XL  . Rinvoq [Upadacitinib] Other (See Comments)    Enlarged the patient's liver- had to come to the ED, 2022    Current Outpatient Medications on File Prior to Visit  Medication Sig Dispense Refill  . B Complex-C (B-COMPLEX WITH  VITAMIN C) tablet Take 1 tablet by mouth daily. 30 tablet 11  . cholecalciferol (VITAMIN D) 25 MCG tablet Take 1 tablet (1,000 Units total) by mouth daily. 30 tablet 0  . CVS D3 25 MCG (1000 UT) capsule Take 1,000 Units by mouth daily.    . hydroxypropyl methylcellulose / hypromellose (ISOPTO TEARS / GONIOVISC) 2.5 % ophthalmic solution Place 1 drop into both eyes 3 (three) times daily. 15 mL 12  . levothyroxine (SYNTHROID) 75 MCG tablet Take 1 tablet (75 mcg total) by mouth daily. 30 tablet 0  . mirtazapine (REMERON) 30 MG tablet Take 1 tablet (30 mg total) by mouth at bedtime. 30 tablet 0  . Multiple Vitamin (MULTIVITAMIN WITH MINERALS) TABS tablet Take 1 tablet by mouth daily. 30 tablet 11  . potassium chloride SA (KLOR-CON) 20 MEQ tablet Take 1 tablet (20 mEq total) by mouth 2 (two) times daily. 60 tablet 0  . thiamine (VITAMIN B-1) 100 MG tablet Take 100 mg by mouth daily.    Marland Kitchen zinc sulfate 220 (50 Zn) MG capsule Take 1 capsule (220 mg total) by mouth daily. 30 capsule 0   No current facility-administered medications on file prior to visit.    Observations/Objective: Alert and oriented x 3. Not in acute distress. Physical examination not completed as this is a telemedicine visit.  Assessment and Plan: 1. Encounter to establish care: - Patient presents today to establish care.  - Return for annual physical examination, labs, and health maintenance. Arrive fasting meaning having no food for at least 8 hours prior to appointment. You may have only water or black coffee. Please take scheduled medications as normal.   Follow Up Instructions: Return for annual  physical exam.    Patient was given clear instructions to go to Emergency Department or return to medical center if symptoms don't improve, worsen, or new problems develop.The patient verbalized understanding.  I discussed the assessment and treatment plan with the patient. The patient was provided an opportunity to ask questions and  all were answered. The patient agreed with the plan and demonstrated an understanding of the instructions.   The patient was advised to call back or seek an in-person evaluation if the symptoms worsen or if the condition fails to improve as anticipated.   I provided 5 minutes total of non-face-to-face time during this encounter.   Camillia Herter, NP  [facility] [place], [place] 07/22/2020, 2:35 PM

## 2020-07-22 ENCOUNTER — Telehealth (INDEPENDENT_AMBULATORY_CARE_PROVIDER_SITE_OTHER): Payer: 59 | Admitting: Family

## 2020-07-22 ENCOUNTER — Other Ambulatory Visit: Payer: Self-pay | Admitting: Physical Medicine and Rehabilitation

## 2020-07-22 ENCOUNTER — Other Ambulatory Visit: Payer: Self-pay

## 2020-07-22 DIAGNOSIS — Z7689 Persons encountering health services in other specified circumstances: Secondary | ICD-10-CM

## 2020-07-22 NOTE — Progress Notes (Signed)
Establish care

## 2020-07-23 ENCOUNTER — Other Ambulatory Visit: Payer: Self-pay | Admitting: Physical Medicine and Rehabilitation

## 2020-07-24 ENCOUNTER — Telehealth: Payer: Self-pay | Admitting: *Deleted

## 2020-07-24 NOTE — Telephone Encounter (Signed)
Potassium normal 07/14/2020. From primary care standpoint Potassium tablets not needed at this time.   However, the labs on 07/14/2020 were collected by Oncology provider. Patient encouraged to confirm with Oncology if they would like her to continue Potassium tablets so that they may refill for her.

## 2020-07-24 NOTE — Telephone Encounter (Signed)
Ms Pershing Cox called with a request for Pam Love to refill her potassium.  I have sent a request to her PCP.

## 2020-07-27 ENCOUNTER — Other Ambulatory Visit: Payer: Self-pay | Admitting: Physical Medicine and Rehabilitation

## 2020-08-08 ENCOUNTER — Ambulatory Visit (INDEPENDENT_AMBULATORY_CARE_PROVIDER_SITE_OTHER): Payer: 59 | Admitting: Family

## 2020-08-08 ENCOUNTER — Encounter: Payer: Self-pay | Admitting: Family

## 2020-08-08 ENCOUNTER — Other Ambulatory Visit: Payer: Self-pay

## 2020-08-08 VITALS — BP 110/78 | HR 87 | Temp 98.4°F | Resp 14 | Ht 62.01 in | Wt 106.4 lb

## 2020-08-08 DIAGNOSIS — Z1329 Encounter for screening for other suspected endocrine disorder: Secondary | ICD-10-CM

## 2020-08-08 DIAGNOSIS — M329 Systemic lupus erythematosus, unspecified: Secondary | ICD-10-CM | POA: Insufficient documentation

## 2020-08-08 DIAGNOSIS — Z124 Encounter for screening for malignant neoplasm of cervix: Secondary | ICD-10-CM

## 2020-08-08 DIAGNOSIS — Z130 Encounter for screening for diseases of the blood and blood-forming organs and certain disorders involving the immune mechanism: Secondary | ICD-10-CM

## 2020-08-08 DIAGNOSIS — Z0000 Encounter for general adult medical examination without abnormal findings: Secondary | ICD-10-CM

## 2020-08-08 DIAGNOSIS — Z131 Encounter for screening for diabetes mellitus: Secondary | ICD-10-CM | POA: Diagnosis not present

## 2020-08-08 DIAGNOSIS — Z13228 Encounter for screening for other metabolic disorders: Secondary | ICD-10-CM

## 2020-08-08 DIAGNOSIS — Z13220 Encounter for screening for lipoid disorders: Secondary | ICD-10-CM

## 2020-08-08 NOTE — Patient Instructions (Signed)
Preventive Care 21-21 Years Old, Female Preventive care refers to lifestyle choices and visits with your health care provider that can promote health and wellness. This includes:  A yearly physical exam. This is also called an annual wellness visit.  Regular dental and eye exams.  Immunizations.  Screening for certain conditions.  Healthy lifestyle choices, such as: ? Eating a healthy diet. ? Getting regular exercise. ? Not using drugs or products that contain nicotine and tobacco. ? Limiting alcohol use. What can I expect for my preventive care visit? Physical exam Your health care provider may check your:  Height and weight. These may be used to calculate your BMI (body mass index). BMI is a measurement that tells if you are at a healthy weight.  Heart rate and blood pressure.  Body temperature.  Skin for abnormal spots. Counseling Your health care provider may ask you questions about your:  Past medical problems.  Family's medical history.  Alcohol, tobacco, and drug use.  Emotional well-being.  Home life and relationship well-being.  Sexual activity.  Diet, exercise, and sleep habits.  Work and work environment.  Access to firearms.  Method of birth control.  Menstrual cycle.  Pregnancy history. What immunizations do I need? Vaccines are usually given at various ages, according to a schedule. Your health care provider will recommend vaccines for you based on your age, medical history, and lifestyle or other factors, such as travel or where you work.   What tests do I need? Blood tests  Lipid and cholesterol levels. These may be checked every 5 years starting at age 20.  Hepatitis C test.  Hepatitis B test. Screening  Diabetes screening. This is done by checking your blood sugar (glucose) after you have not eaten for a while (fasting).  STD (sexually transmitted disease) testing, if you are at risk.  BRCA-related cancer screening. This may be  done if you have a family history of breast, ovarian, tubal, or peritoneal cancers.  Pelvic exam and Pap test. This may be done every 3 years starting at age 21. Starting at age 30, this may be done every 5 years if you have a Pap test in combination with an HPV test. Talk with your health care provider about your test results, treatment options, and if necessary, the need for more tests.   Follow these instructions at home: Eating and drinking  Eat a healthy diet that includes fresh fruits and vegetables, whole grains, lean protein, and low-fat dairy products.  Take vitamin and mineral supplements as recommended by your health care provider.  Do not drink alcohol if: ? Your health care provider tells you not to drink. ? You are pregnant, may be pregnant, or are planning to become pregnant.  If you drink alcohol: ? Limit how much you have to 0-1 drink a day. ? Be aware of how much alcohol is in your drink. In the [place] one drink equals one 12 oz bottle of beer (355 mL), one 5 oz glass of wine (148 mL), or one 1 oz glass of hard liquor (44 mL).   Lifestyle  Take daily care of your teeth and gums. Brush your teeth every morning and night with fluoride toothpaste. Floss one time each day.  Stay active. Exercise for at least 30 minutes 5 or more days each week.  Do not use any products that contain nicotine or tobacco, such as cigarettes, e-cigarettes, and chewing tobacco. If you need help quitting, ask your health care provider.  Do not   use drugs.  If you are sexually active, practice safe sex. Use a condom or other form of protection to prevent STIs (sexually transmitted infections).  If you do not wish to become pregnant, use a form of birth control. If you plan to become pregnant, see your health care provider for a prepregnancy visit.  Find healthy ways to cope with stress, such as: ? Meditation, yoga, or listening to music. ? Journaling. ? Talking to a trusted  person. ? Spending time with friends and family. Safety  Always wear your seat belt while driving or riding in a vehicle.  Do not drive: ? If you have been drinking alcohol. Do not ride with someone who has been drinking. ? When you are tired or distracted. ? While texting.  Wear a helmet and other protective equipment during sports activities.  If you have firearms in your house, make sure you follow all gun safety procedures.  Seek help if you have been physically or sexually abused. What's next?  Go to your health care provider once a year for an annual wellness visit.  Ask your health care provider how often you should have your eyes and teeth checked.  Stay up to date on all vaccines. This information is not intended to replace advice given to you by your health care provider. Make sure you discuss any questions you have with your health care provider. Document Revised: 10/28/2019 Document Reviewed: 11/10/2017 Elsevier Patient Education  2021 [employer]vier Inc.  

## 2020-08-08 NOTE — Progress Notes (Signed)
Patient ID: Jessica Frazier, female    DOB: 11-28-2000  MRN: [id number]  CC: Annual Physical Exam  Subjective: Jessica Frazier is a 20 y.o. female who presents for annual physical exam. She is accompanied by her Mother.   Her concerns today include: none.   Patient Active Problem List   Diagnosis Date Noted  . Systemic lupus erythematosus ([place]) 08/08/2020  . Pressure injury of skin 06/23/2020  . TBI (traumatic brain injury) ([place]) 06/16/2020  . Protein-calorie malnutrition, severe 06/07/2020  . Evaluation by psychiatric service required   . Subarachnoid hemorrhage ([place]) 06/03/2020  . Seronegative rheumatoid arthritis ([place]) 06/03/2020  . Other cirrhosis of liver ([place]) 06/03/2020  . Thrombocytopenia ([place]) 06/03/2020  . Pancytopenia ([place]) 06/03/2020  . ICH (intracerebral hemorrhage) ([place]) 06/03/2020  . Hepatitis   . Coagulopathy ([place])   . 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 Elevated LFTs 04/09/2020  . Hashimoto's thyroiditis 08/07/2019  . Discoloration of skin 08/07/2019  . Polyarthritis 12/12/2018  . Acquired hypothyroidism 12/12/2018     Current Outpatient Medications on File Prior to Visit  Medication Sig Dispense Refill  . B Complex-C (B-COMPLEX WITH VITAMIN C) tablet Take 1 tablet by mouth daily. 30 tablet 11  . belimumab (BENLYSTA) 400 MG SOLR injection See admin instructions.    . cholecalciferol (VITAMIN D) 25 MCG tablet Take 1 tablet (1,000 Units total) by mouth daily. 30 tablet 0  . CVS D3 25 MCG (1000 UT) capsule Take 1,000 Units by mouth daily.    Marland Kitchen FLUoxetine (PROZAC) 10 MG capsule     . levothyroxine (SYNTHROID) 75 MCG tablet Take 1 tablet (75 mcg total) by mouth daily. 30 tablet 0  . Multiple Vitamin (MULTIVITAMIN WITH MINERALS) TABS tablet Take 1 tablet by mouth daily. 30 tablet 11  . potassium chloride SA (KLOR-CON) 20 MEQ tablet Take 1 tablet (20 mEq total) by mouth 2 (two) times daily. 60 tablet 0  . thiamine (VITAMIN B-1) 100 MG tablet Take 100  mg by mouth daily.    Marland Kitchen zinc sulfate 220 (50 Zn) MG capsule Take 1 capsule (220 mg total) by mouth daily. 30 capsule 0  . hydroxypropyl methylcellulose / hypromellose (ISOPTO TEARS / GONIOVISC) 2.5 % ophthalmic solution Place 1 drop into both eyes 3 (three) times daily. 15 mL 12  . mirtazapine (REMERON) 30 MG tablet Take 1 tablet (30 mg total) by mouth at bedtime. (Patient not taking: No sig reported) 30 tablet 0  . pantoprazole (PROTONIX) 40 MG tablet TAKE 1 TABLET BY MOUTH EVERYDAY AT BEDTIME (Patient not taking: Reported on 08/08/2020)     No current facility-administered medications on file prior to visit.    Allergies  Allergen Reactions  . Other Diarrhea and Other (See Comments)    Omega XL  . Rinvoq [Upadacitinib] Other (See Comments)    Enlarged the patient's liver- had to come to the ED, 2022    Social History   Socioeconomic History  . Marital status: Single    Spouse name: Not on file  . Number of children: Not on file  . Years of education: Not on file  . Highest education level: Not on file  Occupational History  . Not on file  Tobacco Use  . Smoking status: Never Smoker  . Smokeless tobacco: Never Used  Vaping Use  . Vaping Use: Never used  Substance and Sexual Activity  . Alcohol use: Not Currently  . Drug use: Never  . Sexual activity: Not on file  Other Topics  Concern  . Not on file  Social History Narrative  . Not on file   Social Determinants of Health   Financial Resource Strain: Not on file  Food Insecurity: Not on file  Transportation Needs: Not on file  Physical Activity: Not on file  Stress: Not on file  Social Connections: Not on file  Intimate Partner Violence: Not on file    Family History  Problem Relation Age of Onset  . Graves' disease Mother   . Healthy Father   . Diabetes Maternal Grandfather     History reviewed. No pertinent surgical history.  ROS: Review of Systems Negative except as stated above  PHYSICAL EXAM: BP  110/78 (BP Location: Left Arm, Patient Position: Sitting, Cuff Size: Normal)   Pulse 87   Temp 98.4 F (36.9 C)   Resp 14   Ht 5' 2.01" (1.575 m)   Wt 106 lb 6.4 oz (48.3 kg)   SpO2 99%   BMI 19.46 kg/m   Physical Exam HENT:     Head: Normocephalic and atraumatic.     Right Ear: Tympanic membrane, ear canal and external ear normal.     Left Ear: Tympanic membrane, ear canal and external ear normal.  Eyes:     Extraocular Movements: Extraocular movements intact.     Conjunctiva/sclera: Conjunctivae normal.     Pupils: Pupils are equal, round, and reactive to light.  Cardiovascular:     Rate and Rhythm: Normal rate and regular rhythm.     Pulses: Normal pulses.     Heart sounds: Normal heart sounds.  Pulmonary:     Effort: Pulmonary effort is normal.     Breath sounds: Normal breath sounds.  Chest:     Comments: Patient declined examination.  Abdominal:     General: Abdomen is flat. Bowel sounds are normal.     Palpations: Abdomen is soft.  Genitourinary:    Comments: Patient declined examination.  Musculoskeletal:        General: Normal range of motion.     Cervical back: Normal range of motion and neck supple.  Skin:    General: Skin is warm and dry.     Capillary Refill: Capillary refill takes less than 2 seconds.  Neurological:     General: No focal deficit present.     Mental Status: She is alert and oriented to person, place, and time.  Psychiatric:        Mood and Affect: Mood normal.        Behavior: Behavior normal.    ASSESSMENT AND PLAN: 1. Annual physical exam: - Counseled on 150 minutes of exercise per week as tolerated, healthy eating (including decreased daily intake of saturated fats, cholesterol, added sugars, sodium), STI prevention, and routine healthcare maintenance.  2. Screening for metabolic disorder: - CMP last obtained 07/14/2020 per [facility].   3. Screening for deficiency anemia: - CBC last obtained   07/14/2020 per [facility].   4. Diabetes mellitus screening: - Last hemoglobin A1c 4.8% on 06/03/2020.  5. Screening cholesterol level: - Lipid panel to screen for high cholesterol.  - Lipid panel  6. Thyroid disorder screen: - TSH to check thyroid function.  - TSH  7. Cervical cancer screening: - Declined referral for cervical cancer screening.     Patient was given the opportunity to ask questions.  Patient verbalized understanding of the plan and was able to repeat key elements of the plan. Patient was given  clear instructions to go to Emergency Department or return to medical center if symptoms don't improve, worsen, or new problems develop.The patient verbalized understanding.   Orders Placed This Encounter  Procedures  . Lipid panel  . TSH    Follow-up with primary provider as scheduled.   Camillia Herter, NP

## 2020-08-08 NOTE — Progress Notes (Signed)
Annual physical exam

## 2020-08-09 LAB — LIPID PANEL
Chol/HDL Ratio: 2.8 ratio (ref 0.0–4.4)
Cholesterol, Total: 132 mg/dL (ref 100–199)
HDL: 48 mg/dL (ref >39)
LDL Chol Calc (NIH): 68 mg/dL (ref 0–99)
Triglycerides: 82 mg/dL (ref 0–149)
VLDL Cholesterol Cal: 16 mg/dL (ref 5–40)

## 2020-08-09 LAB — TSH: TSH: 1.75 u[IU]/mL (ref 0.450–4.500)

## 2020-08-10 NOTE — Progress Notes (Signed)
Cholesterol normal.   Thyroid function normal. Continue Levothyroxine as prescribed.

## 2020-08-18 ENCOUNTER — Other Ambulatory Visit: Payer: Self-pay | Admitting: Internal Medicine

## 2020-08-27 ENCOUNTER — Encounter: Payer: Self-pay | Admitting: Physical Medicine & Rehabilitation

## 2020-08-27 ENCOUNTER — Other Ambulatory Visit: Payer: Self-pay

## 2020-08-27 ENCOUNTER — Encounter: Payer: 59 | Attending: Registered Nurse | Admitting: Physical Medicine & Rehabilitation

## 2020-08-27 VITALS — BP 114/81 | HR 76 | Temp 98.3°F | Ht 62.0 in | Wt 112.0 lb

## 2020-08-27 DIAGNOSIS — M130 Polyarthritis, unspecified: Secondary | ICD-10-CM | POA: Insufficient documentation

## 2020-08-27 DIAGNOSIS — S069X2S Unspecified intracranial injury with loss of consciousness of 31 minutes to 59 minutes, sequela: Secondary | ICD-10-CM | POA: Insufficient documentation

## 2020-08-27 MED ORDER — PREDNISONE 5 MG PO TABS: 15.0000 mg | ORAL_TABLET | Freq: Every day | ORAL | 3 refills | Status: DC

## 2020-08-27 NOTE — Patient Instructions (Signed)
WAYS TO BOOST YOUR MEMORY!  QUIET ENVIRONMENT  AVOID MULT-TASKING. APPROACH TASKS ONE AT A TIME UNTIL THEY ARE COMPLETE!  TAKE NOTES ON YOUR PHONE, NOTE PAD, WHATEVER!  PUT THINGS THAT YOU NEED TO DO ON YOUR CALENDAR, EVEN THE THINGS MIGHT NOT SEEM OVERLY IMPORTANT. IN THIS WAY YOU CAN BUILD STRUCTURE AND ROUTINE.

## 2020-08-27 NOTE — Progress Notes (Signed)
Subjective:    Patient ID: Jessica Frazier, female    DOB: 10-01-2000, 20 y.o.   MRN: [id number]  HPI  Jessica Frazier is here in follow up of her [place]. She suffered a fall right when she left rehab but has doing quite well since. She has completed her outpt therapies. She is now working on [employer] which includes, balance, strength and ROM exercises. She's not having any pain. Sleep is good. Appetite is normal. There are no problems with balance. She walks without a device.    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   She wants to go to school to be a paralegal.   She does feel that her memory is a problem at times.  She can forget some day-to-day information.  She may occasionally forget something like her car keys or phone and have to find where she placed them.  She does take notes which helps.  She denies any problems with attention.   Pain Inventory Average Pain 0 Pain Right Now 0 My pain is tingling  In the last 24 hours, has pain interfered with the following? General activity 1 Relation with others 0 Enjoyment of life 3 What TIME of day is your pain at its worst? None  Sleep (in general) Good  Pain is worse with: some activites Pain improves with: rest, heat/ice, and therapy/exercise Relief from Meds: 4  Family History  Problem Relation Age of Onset   Graves' disease Mother    Healthy Father    Diabetes Maternal Grandfather    Social History   Socioeconomic History   Marital status: Single    Spouse name: Not on file   Number of children: Not on file   Years of education: Not on file   Highest education level: Not on file  Occupational History   Not on file  Tobacco Use   Smoking status: Never   Smokeless tobacco: Never  Vaping Use   Vaping Use: Never used  Substance and Sexual Activity   Alcohol use: Not Currently   Drug use: Never   Sexual activity: Not on file  Other Topics Concern   Not on file  Social History  Narrative   Not on file   Social Determinants of Health   Financial Resource Strain: Not on file  Food Insecurity: Not on file  Transportation Needs: Not on file  Physical Activity: Not on file  Stress: Not on file  Social Connections: Not on file   History reviewed. No pertinent surgical history. History reviewed. No pertinent surgical history. Past Medical History:  Diagnosis Date   Anemia    Phreesia 07/22/2020   Arthritis    Phreesia 07/22/2020   Bell's palsy    Hypothyroidism    Rheumatoid arthritis (HCC)    Thyroid disease    Phreesia 07/22/2020   BP 114/81   Pulse 76   Temp 98.3 F (36.8 C)   Ht 5\' 2"  (1.575 m)   Wt 112 lb (50.8 kg)   SpO2 98%   BMI 20.49 kg/m   Opioid Risk Score:   Fall Risk Score:  `1  Depression screen PHQ 2/9  Depression screen [facility] 2/9 08/08/2020 07/22/2020 07/10/2020  Decreased Interest 0 0 0  Down, Depressed, Hopeless 0 0 0  PHQ - 2 Score 0 0 0  Altered sleeping 0 0 1  Tired, decreased energy 1 0 0  Change in appetite 1 0 0  Feeling bad or failure about yourself  0 0 0  Trouble concentrating 0 0 0  Moving slowly or fidgety/restless 0 0 0  Suicidal thoughts 0 0 0  PHQ-9 Score 2 0 1    Review of Systems  Constitutional: Negative.   HENT: Negative.    Eyes: Negative.   Respiratory: Negative.    Cardiovascular: Negative.   Gastrointestinal: Negative.   Endocrine: Negative.   Genitourinary: Negative.   Musculoskeletal: Negative.   Skin: Negative.   Allergic/Immunologic: Negative.   Neurological: Negative.   Hematological: Negative.   Psychiatric/Behavioral: Negative.    All other systems reviewed and are negative.     Objective:   Physical Exam Gen: no distress, normal appearing HEENT: oral mucosa pink and moist, NCAT Cardio: Reg rate Chest: normal effort, normal rate of breathing Abd: soft, non-distended Ext: no edema Psych: pleasant, normal affect Skin: intact Neuro: Patient is alert and oriented.  She knew the  date except for the number which she missed by 2 days.  Sometimes slow to process but I believe a lot of this was language related.  We performed some basic testing and she was able to spell the word world forward and backwards without mistake.  She is able to perform serial sevens without mistake.  She remembered 3 out of 3 words after 5 minutes.  She could not complete abstract thinking but this was more cultural based.  Strength 5 out of 5.  Sensory is normal.  She had normal balance and walked without a device today. Musculoskeletal: Patient without pain and had normal range of motion throughout       Assessment & Plan:  1.    Decline in self-care and mobility secondary to traumatic brain injury with subarachnoid as well as intraparenchymal hemorrhage             -Doing quite well.  We discussed her memory deficits which are really mild.  I suspect these are probably related to concentration deficits and distracted environments.  We discussed ways to maximize her memory which include adequate sleep each night, quiet environment to work and listen, avoidance of multitasking, regular notetaking and building up of a routine and schedule.  I think she will do quite well overall. 2. . Pain Management: No pain at present.  4. Mood: Seems very upbeat and has supportive family.   5. Anemia/pancytopenia:  .             -Outpatient hematology/oncology follow-up  10. Severe fatty liver w/Abnormal LFTs and coagulopathy: Outpatient GI follow-up  11. Seronegative RA/Lupus:               -15 mg prednisone per rheumatology  Fifteen minutes of face to face patient care time were spent during this visit. All questions were encouraged and answered.  Follow up with me as needed

## 2020-12-24 ENCOUNTER — Other Ambulatory Visit: Payer: Self-pay | Admitting: Internal Medicine

## 2021-01-08 ENCOUNTER — Other Ambulatory Visit: Payer: Self-pay | Admitting: Internal Medicine

## 2021-01-22 LAB — FLOW CYTOMETRY

## 2021-02-02 ENCOUNTER — Encounter: Payer: Self-pay | Admitting: Internal Medicine

## 2021-02-02 ENCOUNTER — Other Ambulatory Visit: Payer: Self-pay

## 2021-02-02 ENCOUNTER — Ambulatory Visit (INDEPENDENT_AMBULATORY_CARE_PROVIDER_SITE_OTHER): Payer: 59 | Admitting: Internal Medicine

## 2021-02-02 VITALS — BP 114/70 | HR 70 | Ht 62.0 in | Wt 119.8 lb

## 2021-02-02 DIAGNOSIS — E063 Autoimmune thyroiditis: Secondary | ICD-10-CM | POA: Diagnosis not present

## 2021-02-02 LAB — TSH: TSH: 5.97 u[IU]/mL — ABNORMAL HIGH (ref 0.35–5.50)

## 2021-02-02 NOTE — Progress Notes (Signed)
Name: Jessica Frazier  MRN/ DOB: [id number], [birth year]    Age/ Sex: 20 y.o., female     PCP: Camillia Herter, NP   Reason for Endocrinology Evaluation: Hypothyroidism     Initial Endocrinology Clinic Visit: 12/12/2019    PATIENT IDENTIFIER: Jessica Frazier is a 20 y.o., female with a past medical history of hypothyroidism and Lupus . She has followed with [facility] since 12/12/2019 for consultative assistance with management of her hypothyroidism.   HISTORICAL SUMMARY:  Pt was noted to have an elevated TSH at 9.7 uIU/mL during an evaluation for severe joint pains and aches in 11/2018 She was started on Lt-4 replacement at the time.   Mother with hyperthyroidism  SUBJECTIVE:    Today (02/02/2021):  Jessica Frazier is here for a follow up on hypothyroidism.  She has been compliant with LT-4 replacement.  Weight has been trending up  Denies constipation Denies local neck symptoms  Denies palpitations     Denies local neck symptoms   Since her last visit here she was hospitalized for tansaminitis on Rinvoq . Her diagnosis was changed from RA to SLE currently on Benlysta  She was also diagnosed with sub arachnoid hemorrhage as well -conservative therapy     LMP last month- regular      HISTORY:  Past Medical History:  Past Medical History:  Diagnosis Date   Anemia    Phreesia 07/22/2020   Arthritis    Phreesia 07/22/2020   Bell's palsy    Hypothyroidism    Rheumatoid arthritis ([place])    Thyroid disease    Phreesia 07/22/2020   Past Surgical History: No past surgical history on file. Social History:  reports that she has never smoked. She has never used smokeless tobacco. She reports that she does not currently use alcohol. She reports that she does not use drugs. Family History:  Family History  Problem Relation Age of Onset   Graves' disease Mother    Healthy Father    Diabetes Maternal Grandfather      HOME  MEDICATIONS: Allergies as of 02/02/2021       Reactions   Other Diarrhea, Other (See Comments)   Omega XL   Rinvoq [upadacitinib] Other (See Comments)   Enlarged the patient's liver- had to come to the ED, 2022        Medication List        Accurate as of February 02, 2021  1:39 PM. If you have any questions, ask your nurse or doctor.          STOP taking these medications    B-complex with vitamin C tablet Stopped by: Dorita Sciara, MD   CVS D3 25 MCG (1000 UT) capsule Generic drug: Cholecalciferol Stopped by: Dorita Sciara, MD   FLUoxetine 10 MG capsule Commonly known as: PROZAC Stopped by: Dorita Sciara, MD   hydroxypropyl methylcellulose / hypromellose 2.5 % ophthalmic solution Commonly known as: ISOPTO TEARS / GONIOVISC Stopped by: Dorita Sciara, MD   mirtazapine 30 MG tablet Commonly known as: REMERON Stopped by: Dorita Sciara, MD   multivitamin with minerals Tabs tablet Stopped by: Dorita Sciara, MD   pantoprazole 40 MG tablet Commonly known as: PROTONIX Stopped by: Dorita Sciara, MD   potassium chloride SA 20 MEQ tablet Commonly known as: KLOR-CON Stopped by: Dorita Sciara, MD   predniSONE 5 MG tablet Commonly known as: DELTASONE Stopped by: Dorita Sciara, MD  thiamine 100 MG tablet Commonly known as: VITAMIN B-1 Stopped by: Dorita Sciara, MD   Vitamin D3 25 MCG tablet Commonly known as: Vitamin D Stopped by: Dorita Sciara, MD   zinc sulfate 220 (50 Zn) MG capsule Stopped by: Dorita Sciara, MD       TAKE these medications    Benlysta 400 MG Solr injection Generic drug: belimumab See admin instructions.   levothyroxine 75 MCG tablet Commonly known as: SYNTHROID TAKE 1 TABLET BY MOUTH EVERY DAY          OBJECTIVE:   PHYSICAL EXAM: VS: BP 114/70 (BP Location: Left Arm, Patient Position: Sitting, Cuff Size: Small)   Pulse 70   Ht 5\' 2"   (1.575 m)   Wt 119 lb 12.8 oz (54.3 kg)   SpO2 99%   BMI 21.91 kg/m    EXAM: General: Pt appears well and is in NAD  Neck: General: Supple without adenopathy. Thyroid: Thyroid size is prominent.   Lungs: Clear with good BS bilat with no rales, rhonchi, or wheezes  Heart: Auscultation: RRR.  Abdomen: Normoactive bowel sounds, soft, nontender, without masses or organomegaly palpable  Extremities:  BL LE: No pretibial edema normal ROM and strength.  Mental Status: Judgment, insight: Intact Orientation: Oriented to time, place, and person Mood and affect: No depression, anxiety, or agitation     DATA REVIEWED:    Latest Reference Range & Units 02/02/21 14:04  TSH 0.35 - 5.50 uIU/mL 5.97 (H)       ASSESSMENT / PLAN / RECOMMENDATIONS:   Hypothyroidism Secondary to Hashimoto's Thyroiditis :  - Pt is clinically euthyroid  - No local neck symptoms - Pre-conception counseling done  to increase LT-4 replacement dose by 20% a week  - TSH elevated, will increase the dose as below  Medications   Stop Levothyroxine 75 mcg daily  Start Levothyroxine 88 mcg daily      F/U in 1 yr  Labs in 8 weeks   Signed electronically by: Mack Guise, MD  [facility]  [facility] [address] [place], [place] [address] Phone: [phone] FAX: [phone]      CC: Camillia Herter, NP [place] [address] Phone: [phone]  Fax: [phone]   Return to Endocrinology clinic as below: No future appointments.

## 2021-02-02 NOTE — Patient Instructions (Signed)

## 2021-02-03 MED ORDER — LEVOTHYROXINE SODIUM 88 MCG PO TABS: 88.0000 ug | ORAL_TABLET | Freq: Every day | ORAL | 3 refills | Status: DC

## 2021-05-02 NOTE — Progress Notes (Signed)
Erroneous encounter

## 2021-05-06 ENCOUNTER — Encounter: Payer: 59 | Admitting: Family

## 2021-07-21 NOTE — Progress Notes (Signed)
? ?Synopsis: Referred for DOE by Franchot Mimes, MD ? ?Subjective:  ? ?PATIENT ID: Jessica Frazier GENDER: female DOB: [birth year], MRN: [id number] ? ?Chief Complaint  ?Patient presents with  ? Pulmonary Consult  ?  Referred by Dr. Claudie Leach. Pt c/o SOB over the past 2-3 months. She sometimes gets SOB just at rest and also with minimal exertion. Symptoms started after she tried switching from [facility] to [facility] for her Lupus.   ? ?21yF with history of anemia, seronegative RA/ ANA+ (1:2560) SLE on belimumab - follows with Dr. Trudie Reed, ?cirrhosis, TBI, recent traumatic [place] in 2022 ? ?Since her hospitalization where it was eventually felt she had SLE, she has tried the following medications for inflammatory arthritis/SLE: ? ?Benlysta - had been doing well on benlysta but had to change from this due to insurance ?Saphnelo - had worse arthritis on this, increased DOE. ? ?She says after workup for increased DOE, pleuritic CP she was found to have something wrong with right side of her heart on echo. This has since gradually improved since resuming benlysta a month ago.  ? ?Still occasionally gets DOE but pleuritic CP gone. No arthritis, rashes, ulcers in mouth or nose. No trouble swallowing, raynaud's. No miscarriages, blood clots. No bleeding issues since last hospitalization. No longstanding cough.  ? ?No orthopnea, BLE swelling, weight gain.  ? ?Otherwise pertinent review of systems is negative. ? ?No family history autoimmune disease, pulmonary hypertension ? 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 She has never lived outside of [place]. Has never taken weight loss medication/ADD/ADHD medication, no drugs. No smoking.  ? ? ? ?Past Medical History:  ?Diagnosis Date  ? Anemia   ? Phreesia 07/22/2020  ? Arthritis   ? Phreesia 07/22/2020  ? Bell's palsy   ? Hypothyroidism   ? Rheumatoid arthritis ([place])   ? Thyroid disease   ? Phreesia 07/22/2020  ?  ? ?Family  History  ?Problem Relation Age of Onset  ? Graves' disease Mother   ? Healthy Father   ? Diabetes Maternal Grandfather   ?  ? ?No past surgical history on file. ? ?Social History  ? ?Socioeconomic History  ? Marital status: Single  ?  Spouse name: Not on file  ? Number of children: Not on file  ? Years of education: Not on file  ? Highest education level: Not on file  ?Occupational History  ? Not on file  ?Tobacco Use  ? Smoking status: Never  ? Smokeless tobacco: Never  ?Vaping Use  ? Vaping Use: Never used  ?Substance and Sexual Activity  ? Alcohol use: Not Currently  ? Drug use: Never  ? Sexual activity: Not on file  ?Other Topics Concern  ? Not on file  ?Social History Narrative  ? Not on file  ? ?Social Determinants of Health  ? ?Financial Resource Strain: Not on file  ?Food Insecurity: Not on file  ?Transportation Needs: Not on file  ?Physical Activity: Not on file  ?Stress: Not on file  ?Social Connections: Not on file  ?Intimate Partner Violence: Not on file  ?  ? ?Allergies  ?Allergen Reactions  ? Other Diarrhea and Other (See Comments)  ?  Omega XL  ? Rinvoq [Upadacitinib] Other (See Comments)  ?  Enlarged the patient's liver- had to come to the ED, 2022  ?  ? ?Outpatient Medications Prior to Visit  ?Medication Sig Dispense Refill  ? Belimumab (BENLYSTA) 200 MG/ML SOAJ 200 mg  once per wk    ? levothyroxine (SYNTHROID) 100 MCG tablet Take 100 mcg by mouth daily.    ? belimumab (BENLYSTA) 400 MG SOLR injection See admin instructions.    ? levothyroxine (SYNTHROID) 88 MCG tablet Take 1 tablet (88 mcg total) by mouth daily. 90 tablet 3  ? ?No facility-administered medications prior to visit.  ? ? ? ? ? ?Objective:  ? ?Physical Exam: ? ?General appearance: 21 y.o., female, NAD, conversant  ?Eyes: anicteric sclerae; PERRL, tracking appropriately ?HENT: NCAT; MMM ?Neck: Trachea midline; no lymphadenopathy, no JVD ?Lungs: CTAB, no crackles, no wheeze, with normal respiratory effort ?CV: RRR, no murmur  ?Abdomen:  Soft, non-tender; non-distended, BS present  ?Extremities: No peripheral edema, warm ?Skin: Normal turgor and texture; no rash ?Psych: Appropriate affect ?Neuro: Alert and oriented to person and place, no focal deficit  ? ? ? ?Vitals:  ? 07/22/21 1415  ?BP: 112/70  ?Pulse: 92  ?Temp: 98.2 ?F (36.8 ?C)  ?TempSrc: Oral  ?SpO2: 100%  ?Weight: 119 lb (54 kg)  ?Height: '5\' 2"'$  (1.575 m)  ? ?100% on RA ?BMI Readings from Last 3 Encounters:  ?07/22/21 21.77 kg/m?  ?02/02/21 21.91 kg/m?  ?08/27/20 20.49 kg/m?  ? ?Wt Readings from Last 3 Encounters:  ?07/22/21 119 lb (54 kg)  ?02/02/21 119 lb 12.8 oz (54.3 kg)  ?08/27/20 112 lb (50.8 kg)  ? ? ? ?CBC ?   ?Component Value Date/Time  ? WBC 5.5 07/14/2020 1335  ? RBC 3.46 (L) 07/14/2020 1335  ? HGB 10.8 (L) 07/14/2020 1335  ? HCT 34.1 (L) 07/14/2020 1335  ? PLT 319 07/14/2020 1335  ? MCV 98.6 07/14/2020 1335  ? MCH 31.2 07/14/2020 1335  ? MCHC 31.7 07/14/2020 1335  ? RDW 16.7 (H) 07/14/2020 1335  ? LYMPHSABS 2.1 07/14/2020 1335  ? MONOABS 0.3 07/14/2020 1335  ? EOSABS 0.0 07/14/2020 1335  ? BASOSABS 0.0 07/14/2020 1335  ? ? ? ?Chest Imaging: ?CTA Chest 06/02/21 reviewed by me with a couple foci of band like scarring in bases, no PE ? ?Dr. Verdon Cummins summary of [place] TTE 2023: ?Mild RAE, Mildly dilated, nonthickened RV with moderately impaired RV systolic function, RVSP of 86 with moderate TR, no intracardiac shunts ? ?BNP 200  ? ?Pulmonary Functions Testing Results: ?   ? View : No data to display.  ?  ?  ?  ? ? ?   ?Assessment & Plan:  ? 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 Did have steatohepatitis as reaction to upadacitinib, but no evidence of liver fibrosis on liver biopsy 05/2020. Would have WHO Class I to II symptoms. HIV negative last year. Very low suspicion for sleep disordered breathing. No desaturation on walking oximetry today. ? ? ?Plan: ?- PFTs  ?- V/Q scan ?- right heart cath ? ?RTC after trip to [place] to discuss  results ? ? ?Maryjane Hurter, MD ?[facility] ?07/22/2021 2:21 PM  ? ?

## 2021-07-22 ENCOUNTER — Other Ambulatory Visit: Payer: Self-pay | Admitting: Student

## 2021-07-22 ENCOUNTER — Encounter: Payer: Self-pay | Admitting: Student

## 2021-07-22 ENCOUNTER — Ambulatory Visit (INDEPENDENT_AMBULATORY_CARE_PROVIDER_SITE_OTHER): Payer: 59 | Admitting: Student

## 2021-07-22 VITALS — BP 112/70 | HR 92 | Temp 98.2°F | Ht 62.0 in | Wt 119.0 lb

## 2021-07-22 DIAGNOSIS — I272 Pulmonary hypertension, unspecified: Secondary | ICD-10-CM

## 2021-07-22 DIAGNOSIS — R0609 Other forms of dyspnea: Secondary | ICD-10-CM | POA: Diagnosis not present

## 2021-07-22 NOTE — Patient Instructions (Addendum)
-   I have ordered echo but if you get copy of echo from [place] let us know - send my chart message with picture of results or just call to let us know, bring copy of it by clinic. If you definitely had echo at [facility] there's no need for one here now.  ?- breathing tests and clinic visit when you get back from [place] ?

## 2021-07-22 NOTE — Addendum Note (Signed)
Addended by: Maryjane Hurter on: 07/22/2021 06:54 PM ? ? Modules accepted: Orders ? ?

## 2021-07-28 ENCOUNTER — Other Ambulatory Visit: Payer: Self-pay | Admitting: Student

## 2021-07-28 DIAGNOSIS — R0609 Other forms of dyspnea: Secondary | ICD-10-CM

## 2021-07-28 DIAGNOSIS — I272 Pulmonary hypertension, unspecified: Secondary | ICD-10-CM

## 2021-07-30 ENCOUNTER — Other Ambulatory Visit: Payer: Self-pay | Admitting: Student

## 2021-07-30 DIAGNOSIS — I272 Pulmonary hypertension, unspecified: Secondary | ICD-10-CM

## 2021-08-04 ENCOUNTER — Ambulatory Visit (HOSPITAL_COMMUNITY)
Admission: RE | Admit: 2021-08-04 | Discharge: 2021-08-04 | Disposition: A | Discharge status: Home / Self Care | Payer: 59 | Source: Ambulatory Visit | Attending: Student | Admitting: Student

## 2021-08-04 ENCOUNTER — Encounter (HOSPITAL_COMMUNITY)
Admission: RE | Admit: 2021-08-04 | Discharge: 2021-08-04 | Disposition: A | Discharge status: Home / Self Care | Payer: 59 | Source: Ambulatory Visit | Attending: Student | Admitting: Student

## 2021-08-04 DIAGNOSIS — R0609 Other forms of dyspnea: Secondary | ICD-10-CM | POA: Insufficient documentation

## 2021-08-04 DIAGNOSIS — I272 Pulmonary hypertension, unspecified: Secondary | ICD-10-CM

## 2021-08-04 MED ORDER — TECHNETIUM TO 99M ALBUMIN AGGREGATED
4.3000 | Freq: Once | INTRAVENOUS | Status: AC
  Administered 2021-08-04: 4.3 via INTRAVENOUS

## 2021-08-12 IMAGING — US US ABDOMEN LIMITED RUQ/ASCITES
1 series · 14 of 25 positions shown · non-contrast
Comparison: 04/09/2020

CLINICAL DATA: Right upper quadrant pain and vomiting

EXAM:
ULTRASOUND ABDOMEN LIMITED RIGHT UPPER QUADRANT

[Series 1: us abdomen limited ruq (liver/gb) · 14 of 26 slices shown]
[im 1/26]
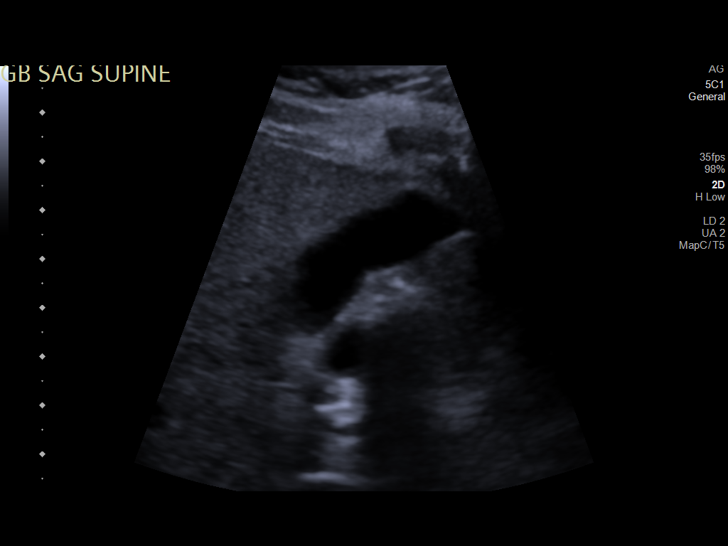
[im 3/26]
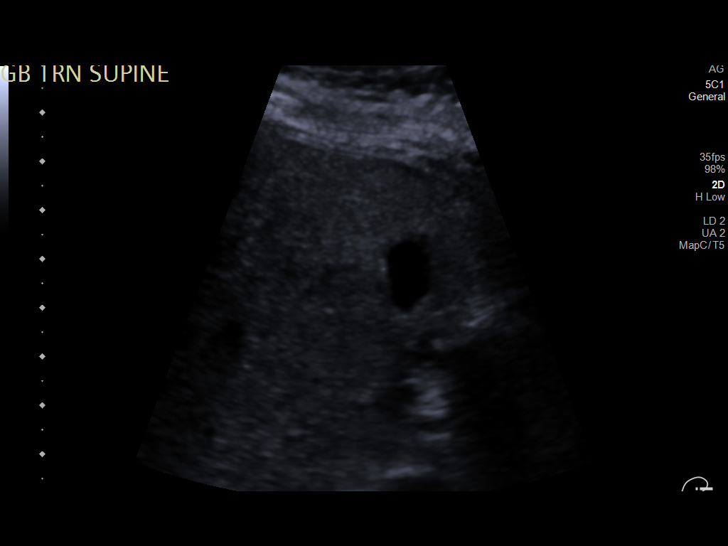
[im 5/26]
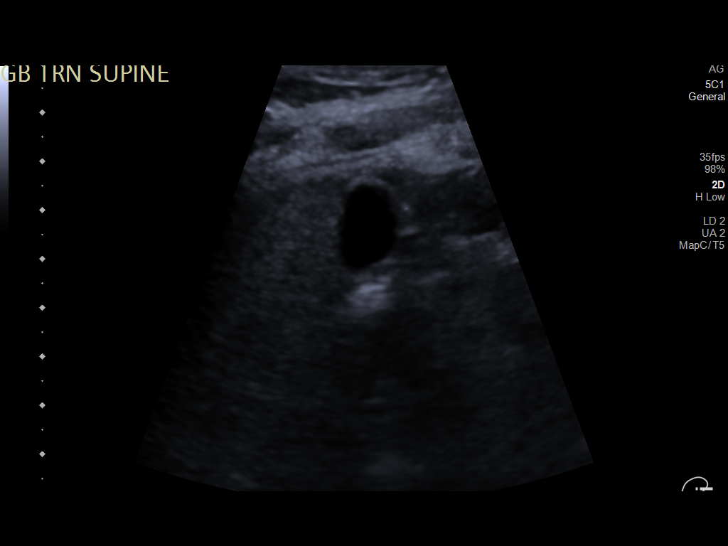
[im 7/26]
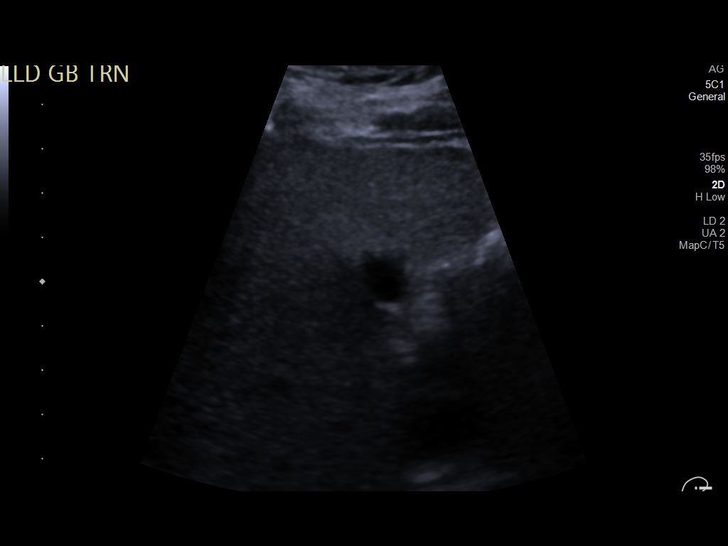
[im 9/26]
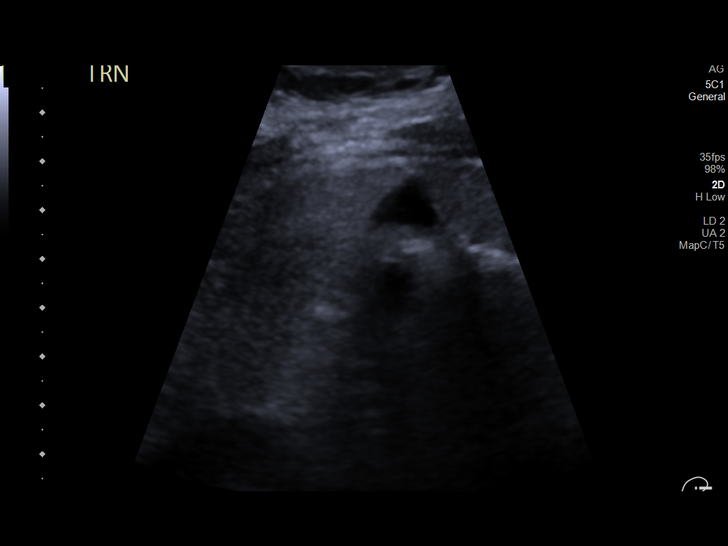
[im 10/26]
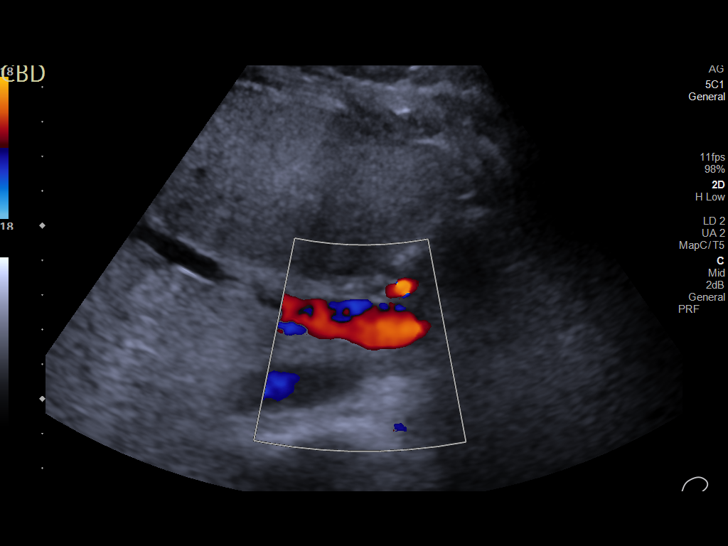
[im 12/26]
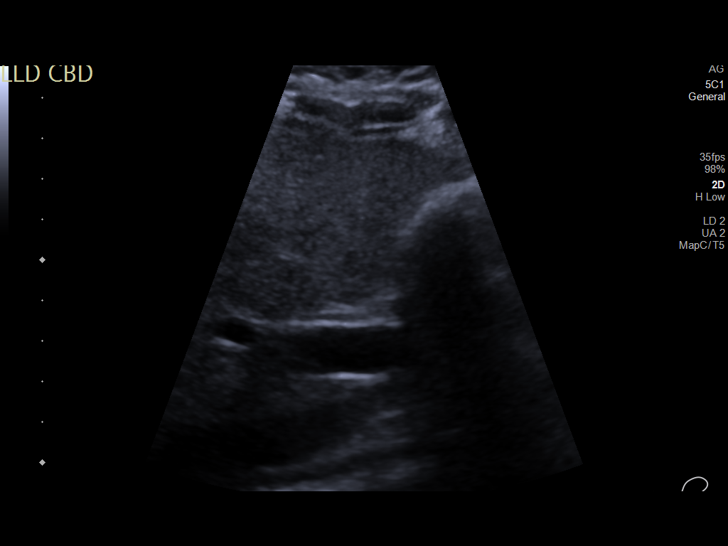
[im 14/26]
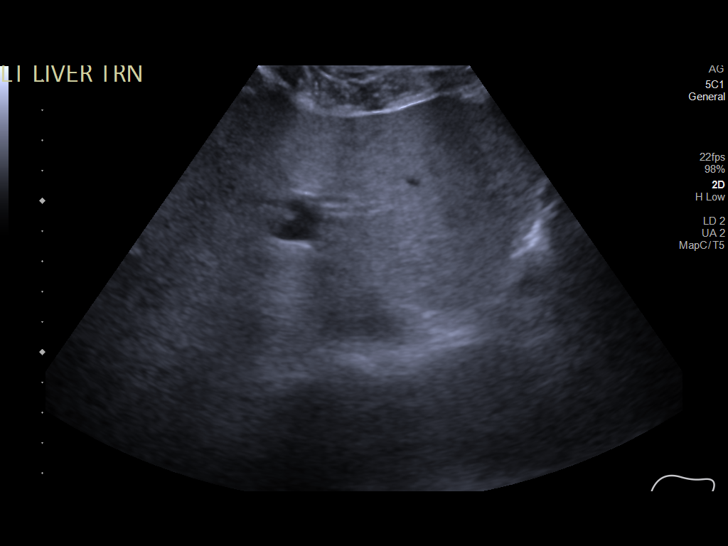
[im 16/26]
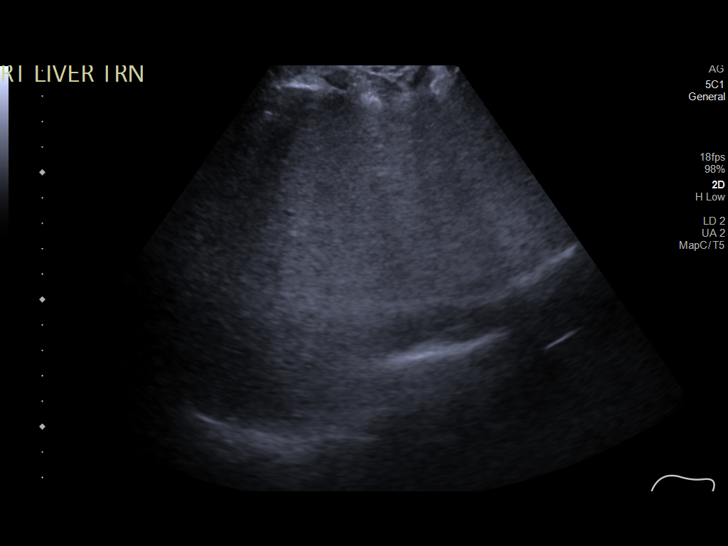
[im 17/26]
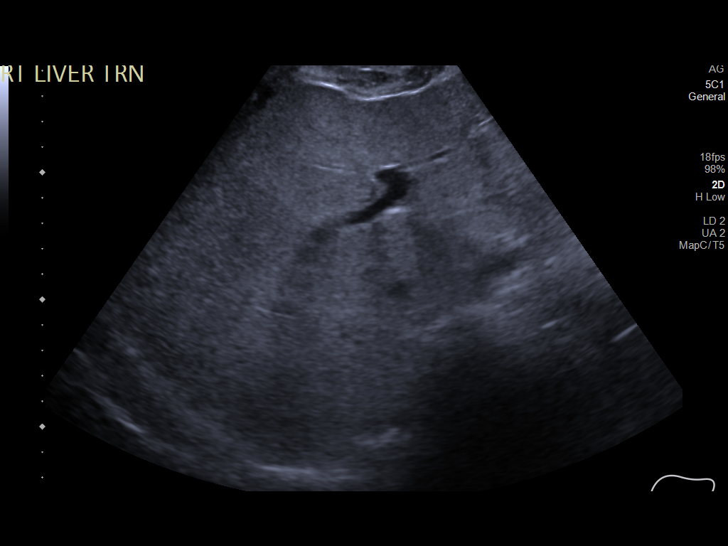
[im 19/26]
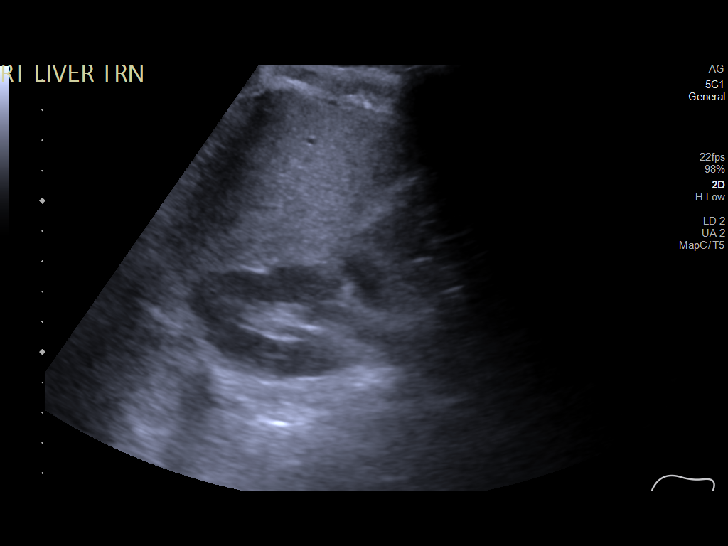
[im 21/26]
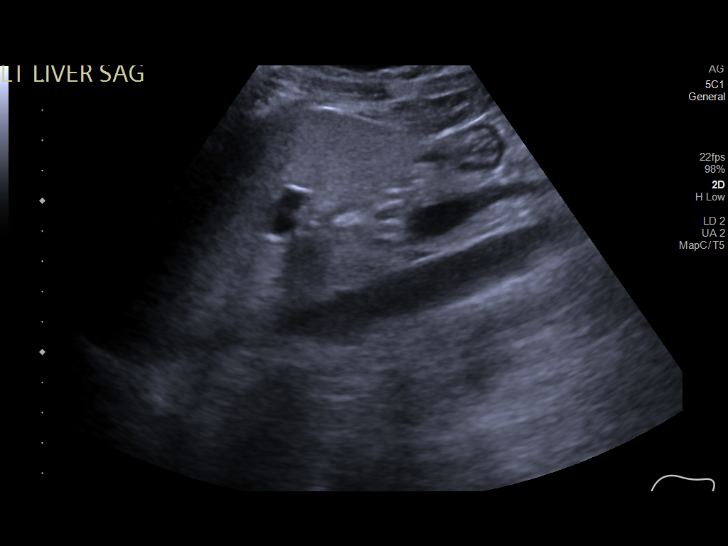
[im 23/26]
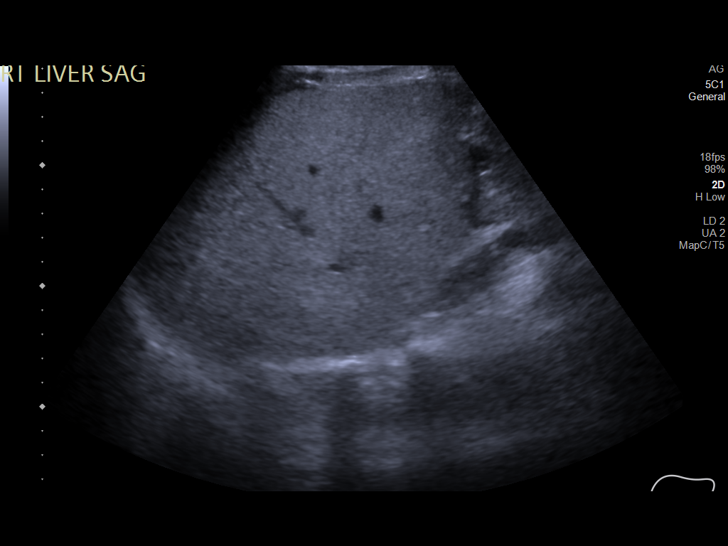
[im 26/26]
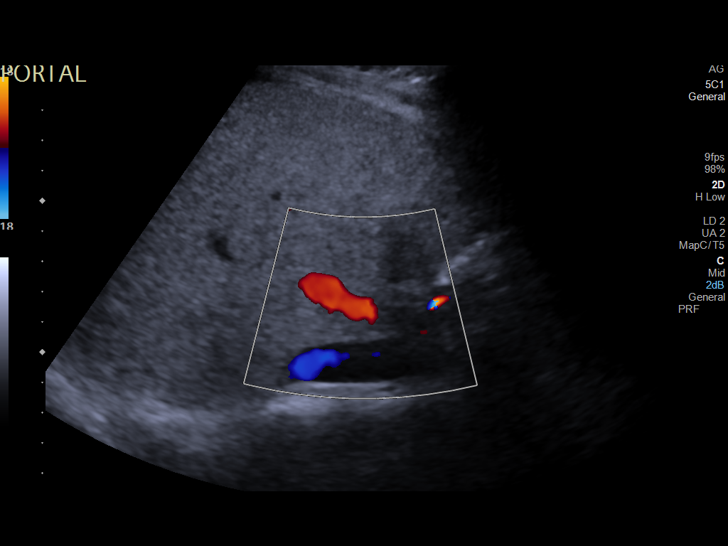

[14 of 25 positions shown; findings below may reference images not displayed]

FINDINGS: Gallbladder:

No gallstones or wall thickening visualized. No sonographic Murphy
sign noted by sonographer.

Common bile duct:

Diameter: 2 mm

Liver:

No focal lesion.

Diffusely increased parenchymal echogenicity.

Portal vein is patent on color Doppler imaging with normal direction
of blood flow towards the liver.

Other: None.
IMPRESSION: Diffuse increased echogenicity of the hepatic parenchyma is a
nonspecific indicator of hepatocellular dysfunction, most commonly
steatosis.

## 2021-09-09 IMAGING — CT CT ANGIO CHEST
2 of 7 series · 18 of 46 positions shown · IV contrast (APPLIED)
Comparison: Abdominal ultrasound dated 05/05/2020.

CLINICAL DATA: 19-year-old female with abdominal pain and concern
for pulmonary embolism.

EXAM:
CT ANGIOGRAPHY CHEST
CT ABDOMEN AND PELVIS WITH CONTRAST
TECHNIQUE: Multidetector CT imaging of the chest was performed using the
standard protocol during bolus administration of intravenous
contrast. Multiplanar CT image reconstructions and MIPs were
obtained to evaluate the vascular anatomy. Multidetector CT imaging
of the abdomen and pelvis was performed using the standard protocol
during bolus administration of intravenous contrast.
CONTRAST:  100mL OMNIPAQUE IOHEXOL 350 MG/ML SOLN

[Series 7: thins · axial · 0.64mm/px · z∈[-363,-128]mm · 15 of 378 slices shown]
[im 21/378  lung]
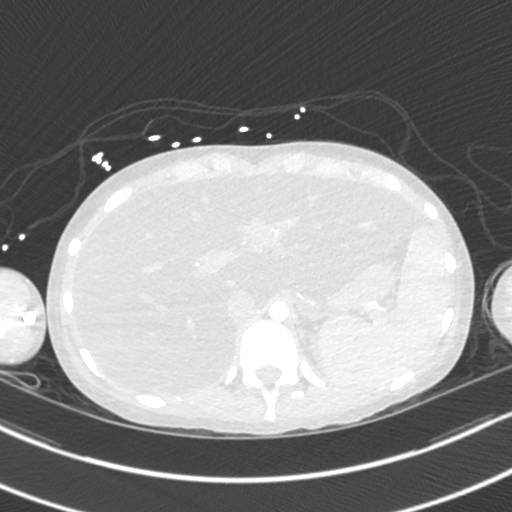
[im 42/378  soft-tissue]
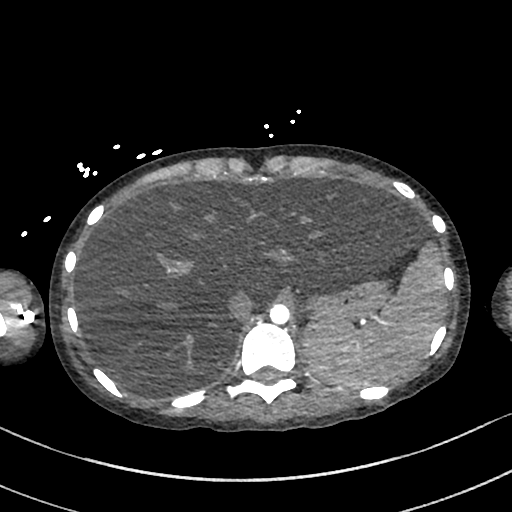
[im 63/378  lung]
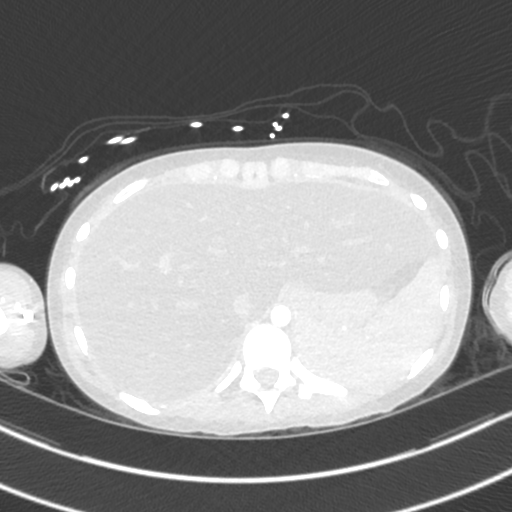
[im 84/378  soft-tissue]
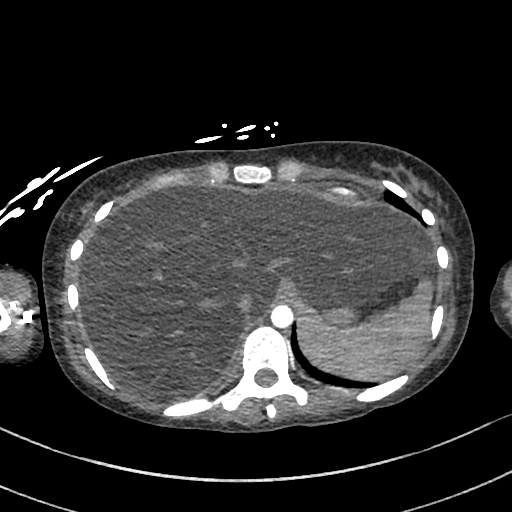
[im 126/378  lung]
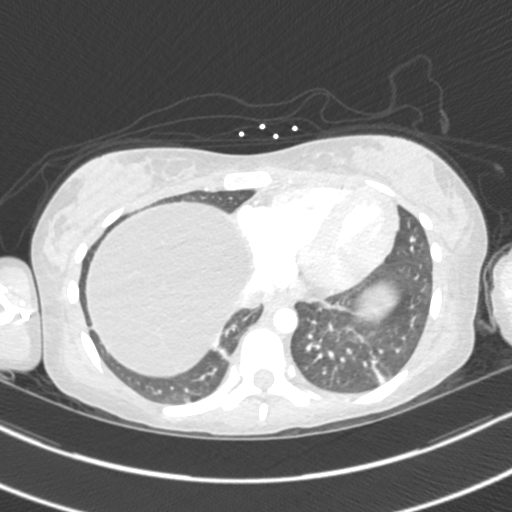
[im 147/378  soft-tissue]
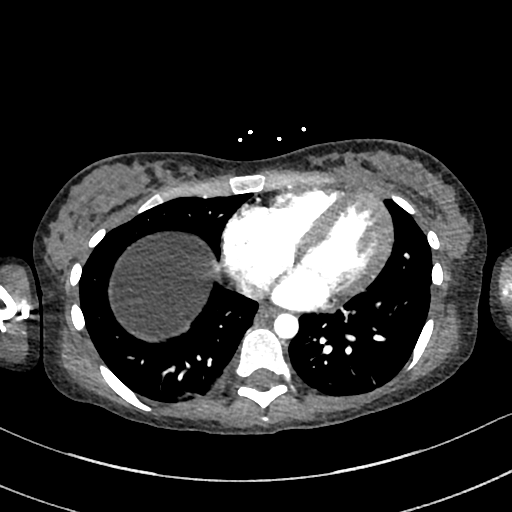
[im 168/378  lung]
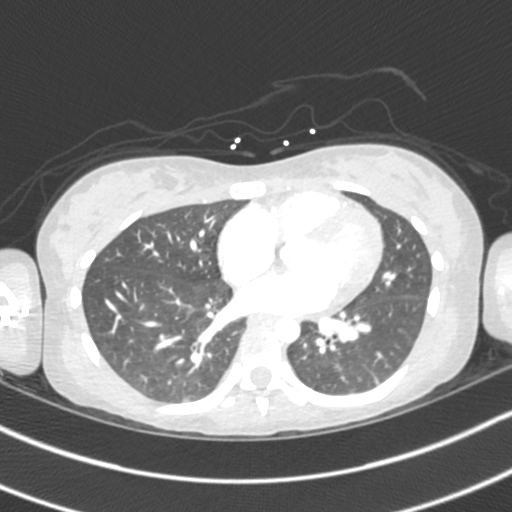
[im 189/378  soft-tissue]
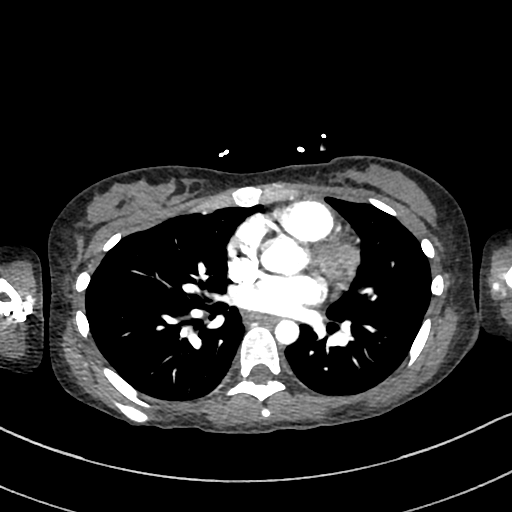
[im 210/378  lung]
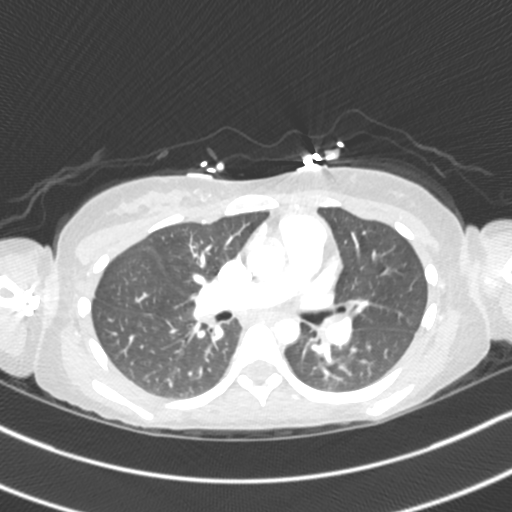
[im 231/378  soft-tissue]
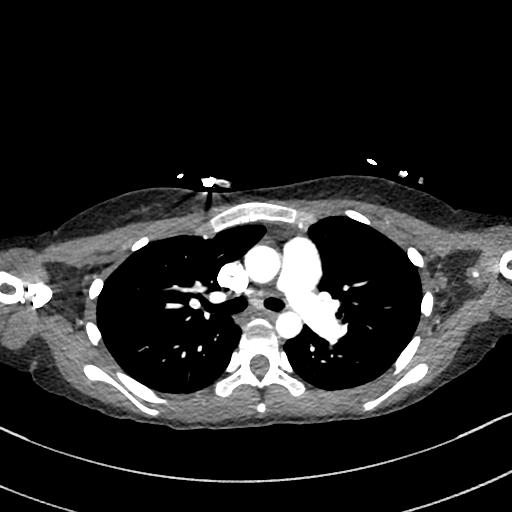
[im 252/378  lung]
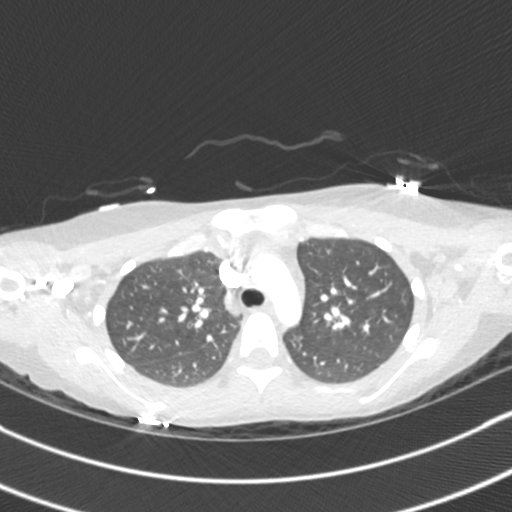
[im 294/378  soft-tissue]
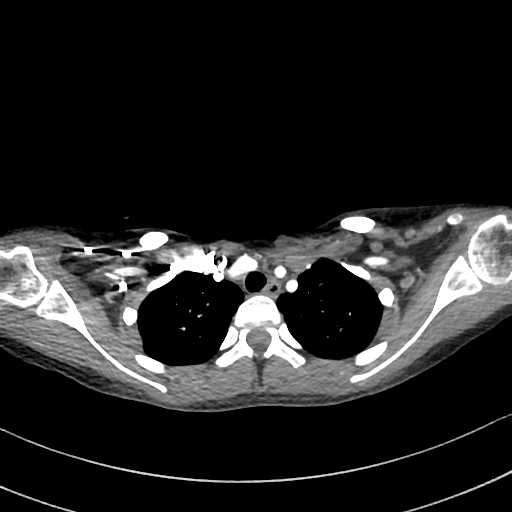
[im 315/378  lung]
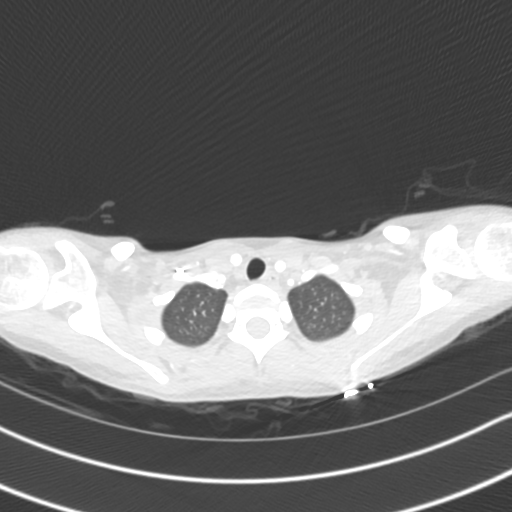
[im 336/378  soft-tissue]
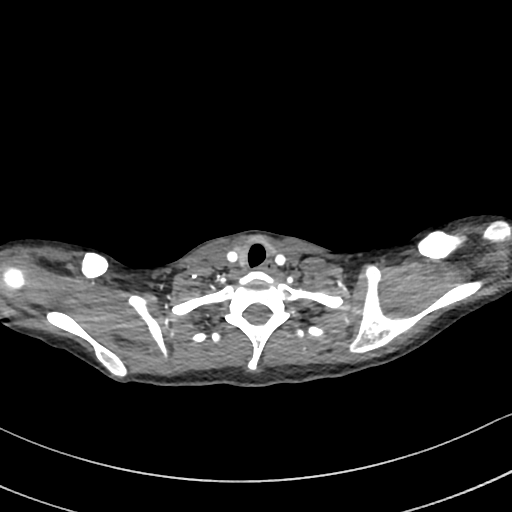
[im 357/378  lung]
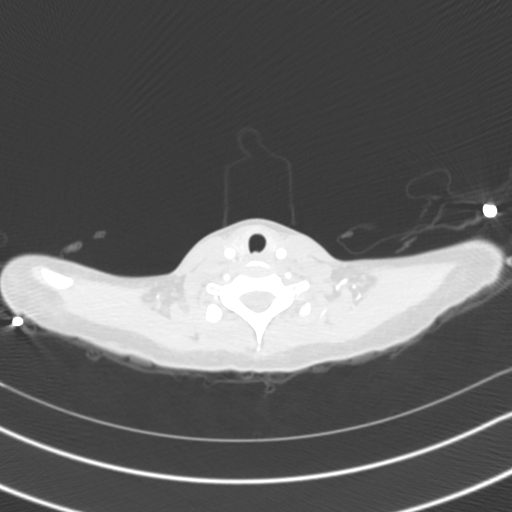

[Series 9: cor · coronal · 0.58mm/px · 3 of 94 slices shown]
[im 24/94  soft-tissue]
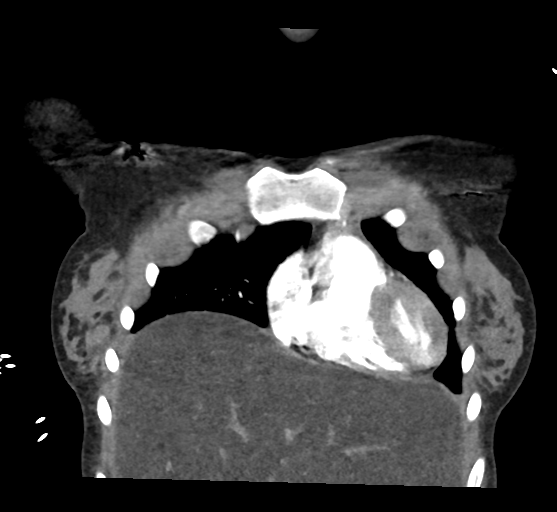
[im 47/94  soft-tissue]
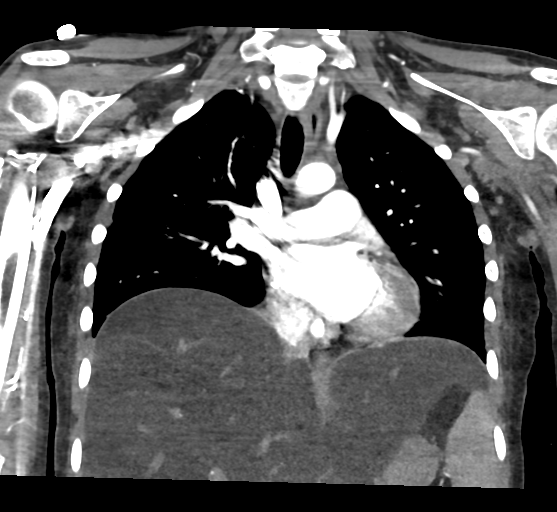
[im 70/94  soft-tissue]
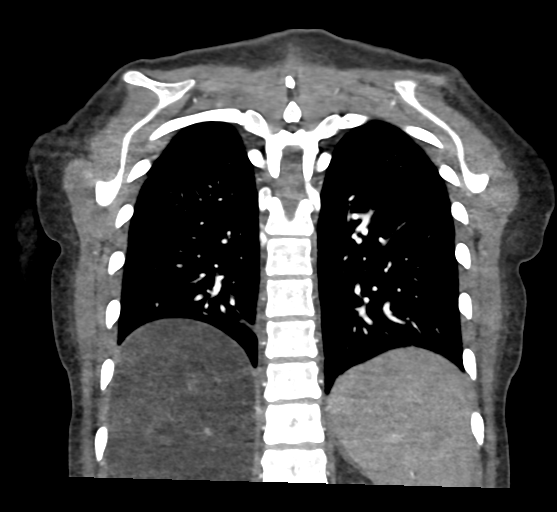

[18 of 46 positions shown; findings below may reference images not displayed]

FINDINGS: CTA CHEST FINDINGS

Cardiovascular: There is no cardiomegaly or pericardial effusion.
The thoracic aorta is unremarkable. The origins of the great vessels
of the aortic arch appear patent. Faint linear hyperdensity along
the left lateral wall of the pulmonary trunk (56/6) likely
artifactual. No pulmonary artery embolus identified.

Mediastinum/Nodes: There is no hilar or mediastinal adenopathy. The
esophagus is grossly unremarkable. No mediastinal fluid collection.

Lungs/Pleura: Bibasilar linear atelectasis/scarring. No focal
consolidation, pleural effusion, or pneumothorax. The central
airways are patent.

Musculoskeletal: No chest wall abnormality. No acute or significant
osseous findings.

Review of the MIP images confirms the above findings.

CT ABDOMEN and PELVIS FINDINGS

No intra-abdominal free air.  Small ascites.

Hepatobiliary: Severe fatty liver. The liver is enlarged measuring
19 cm in midclavicular length. Correlation with clinical exam and
LFTs recommended to evaluate for steatohepatitis. No intrahepatic
biliary ductal dilatation. The gallbladder is predominantly
contracted. No calcified gallstone.

Pancreas: Unremarkable. No pancreatic ductal dilatation or
surrounding inflammatory changes.

Spleen: Normal in size without focal abnormality.

Adrenals/Urinary Tract: The adrenal glands are unremarkable. There
is no hydronephrosis on either side. The visualized ureters appear
unremarkable. The urinary bladder is minimally distended and grossly
unremarkable.

Stomach/Bowel: There is no bowel obstruction or active inflammation.
The appendix is normal.

Vascular/Lymphatic: The abdominal aorta and IVC are unremarkable. No
portal venous gas. There is no adenopathy.

Reproductive: The uterus is anteverted and grossly unremarkable. No
adnexal masses.

Other: Mild diffuse subcutaneous edema.

Musculoskeletal: No acute or significant osseous findings.

Review of the MIP images confirms the above findings.
IMPRESSION: 1. No acute intrathoracic pathology. No CT evidence of pulmonary
embolism.
2. Severe fatty liver with findings of possible steatohepatitis.
Clinical correlation is recommended.
3. Small ascites.
4. No bowel obstruction. Normal appendix.

## 2021-09-10 IMAGING — US US ABDOMEN LIMITED RUQ/ASCITES
1 series · 14 of 25 positions shown · non-contrast
Comparison: CT abdomen pelvis 05/05/2020

CLINICAL DATA: Ischemic hepatitis.

EXAM:
ULTRASOUND ABDOMEN LIMITED RIGHT UPPER QUADRANT

[Series 1: us abdomen limited ruq (liver/gb) · 14 of 28 slices shown]
[im 1/28]
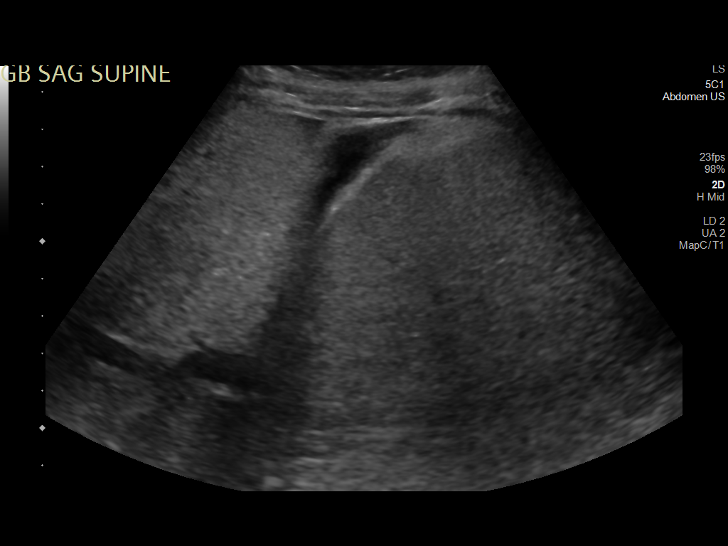
[im 3/28]
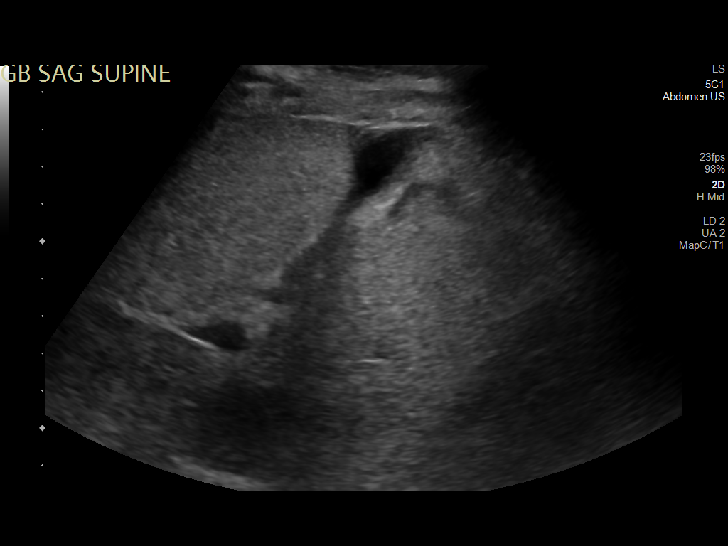
[im 5/28]
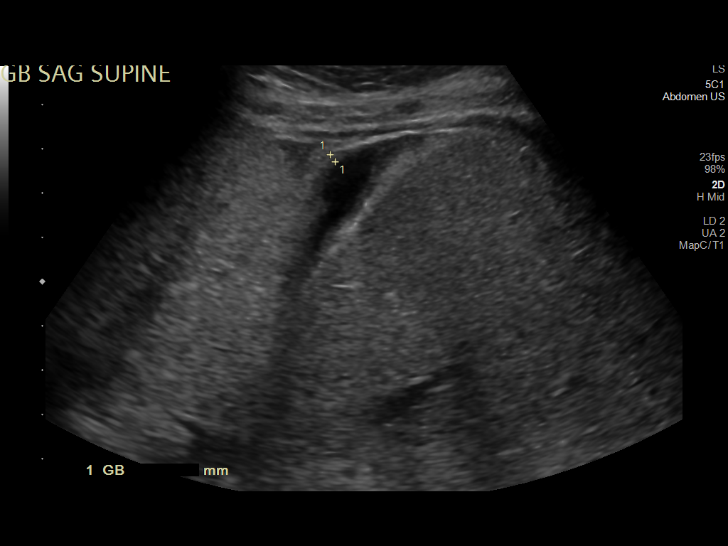
[im 7/28]
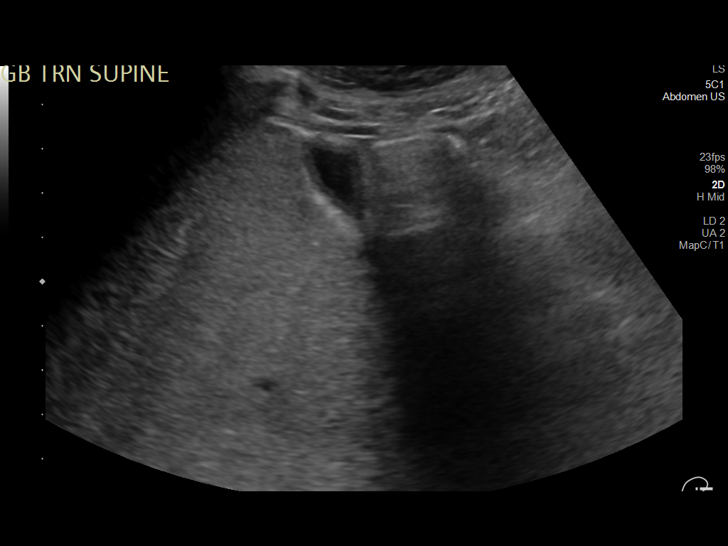
[im 10/28]
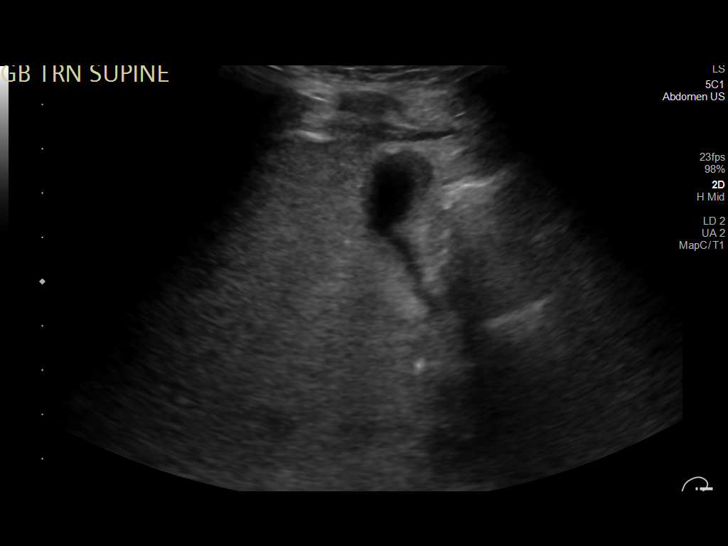
[im 11/28]
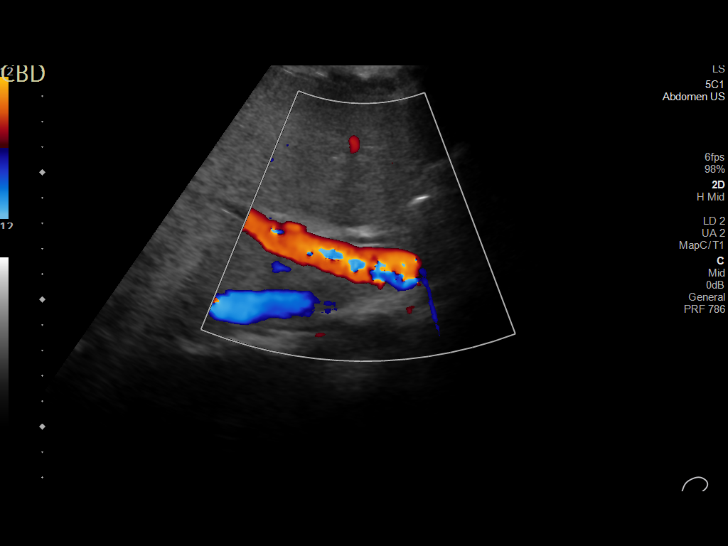
[im 13/28]
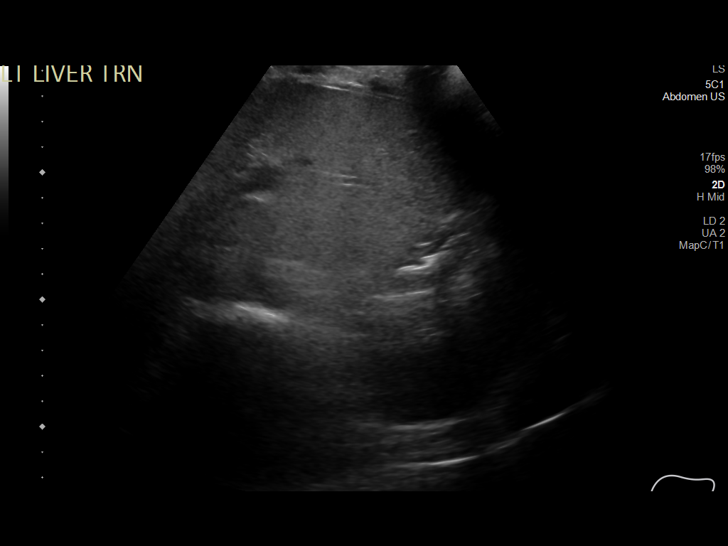
[im 15/28]
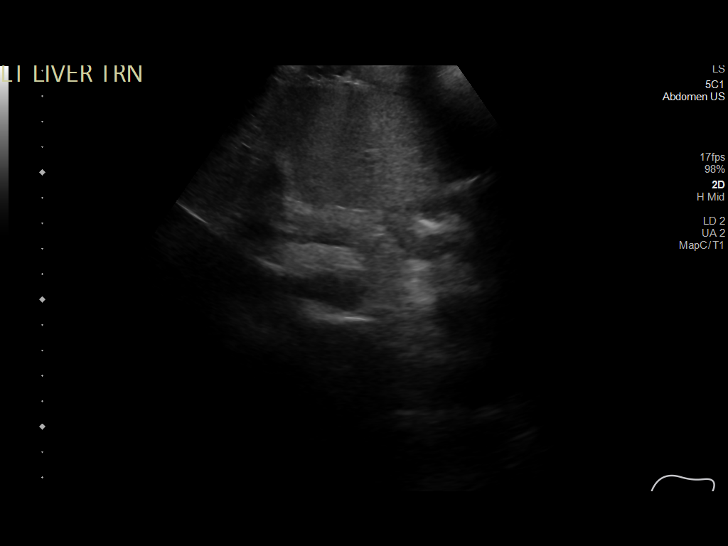
[im 17/28]
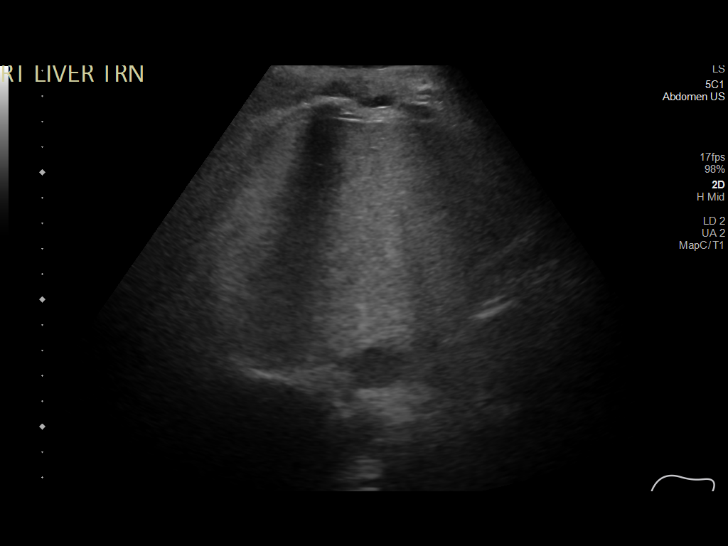
[im 19/28]
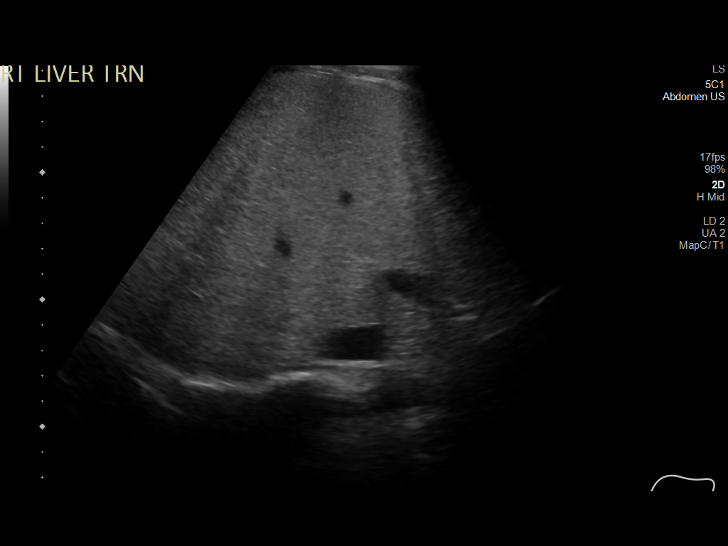
[im 21/28]
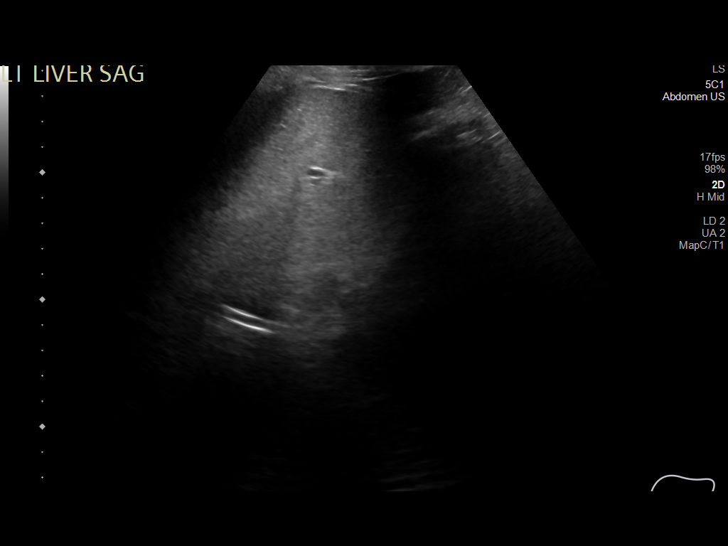
[im 23/28]
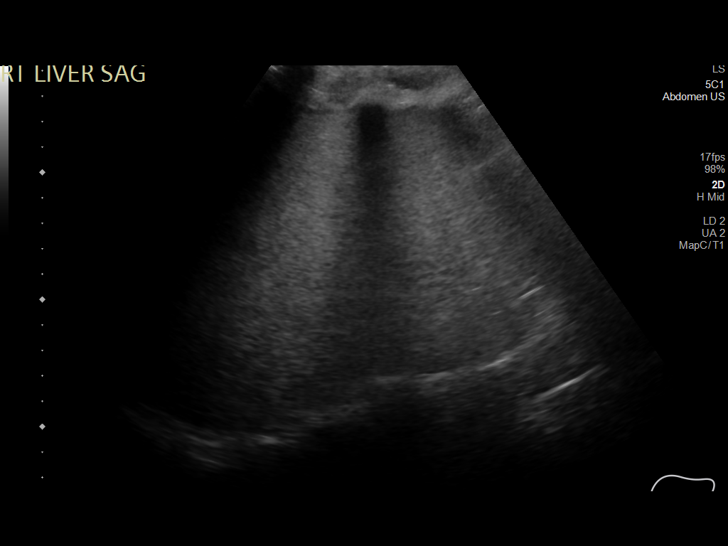
[im 25/28]
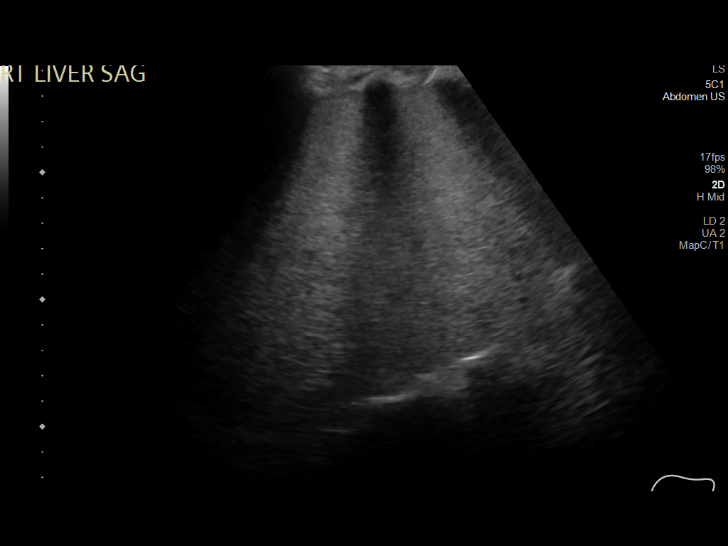
[im 28/28]
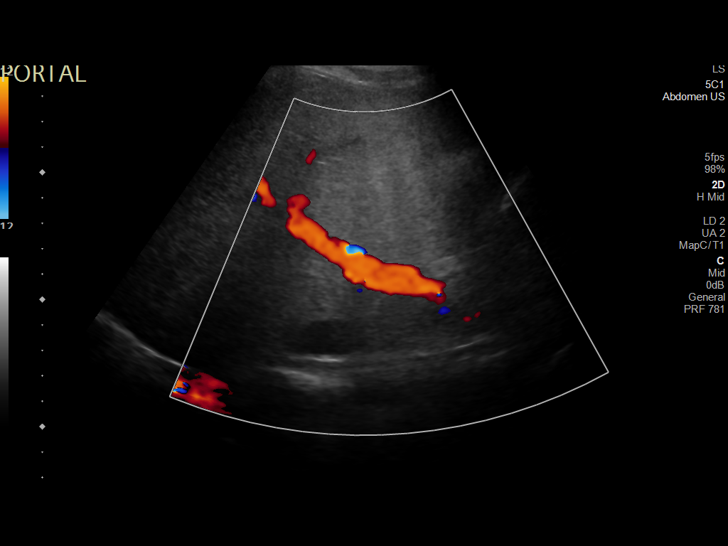

[14 of 25 positions shown; findings below may reference images not displayed]

FINDINGS: Gallbladder:

The gallbladder appears to be contracted. No gallstones or wall
thickening visualized. No sonographic Murphy sign noted by
sonographer.

Common bile duct:

Diameter: 3mm

Liver:

Limited evaluation due to positioning. No focal lesion identified.
Increased parenchymal echogenicity. Portal vein is patent on color
Doppler imaging with normal direction of blood flow towards the
liver.

Other: None.
IMPRESSION: Hepatic steatosis. Please note limited evaluation for focal hepatic
masses in a patient with hepatic steatosis due to decreased
penetration of the acoustic ultrasound waves. Also limited
evaluation due to positioning. Recommend MRI liver protocol.

## 2021-09-11 ENCOUNTER — Telehealth: Payer: Self-pay | Admitting: Student

## 2021-09-11 DIAGNOSIS — I272 Pulmonary hypertension, unspecified: Secondary | ICD-10-CM

## 2021-09-11 NOTE — Telephone Encounter (Signed)
She hasn't yet been called to have right heart catheterization scheduled. Not sure if this is something [facility] team helps with? I've reordered it. She has clinic follow up 09/21/21 with me but we should try to reschedule her to see [place]. OK for her to keep PFT appointment that day but she should see him in about 4 weeks or so which would hopefully allow time for her to have right heart cath done first.  Thanks!!

## 2021-09-14 NOTE — Telephone Encounter (Signed)
I do not see where an order has been placed for this.Marland KitchenMarland KitchenI saw a VQ scan but a hearth cath.

## 2021-09-14 NOTE — Telephone Encounter (Signed)
PCCs, will the patient need to contact cardiology for the right heart cath?

## 2021-09-14 NOTE — Telephone Encounter (Signed)
Order for right heart cath has been placed for Dr. Verlee Monte.

## 2021-09-14 NOTE — Addendum Note (Signed)
Addended by: Valerie Salts on: 09/14/2021 02:25 PM   Modules accepted: Orders

## 2021-09-18 NOTE — Progress Notes (Deleted)
Synopsis: Referred for DOE by [name], NP  Subjective:   PATIENT ID: Jessica Frazier Deleeuw GENDER: female DOB: 05-14-2000, MRN: [id number]  No chief complaint on file.  21yF with history of anemia, seronegative RA/ ANA+ (1:2560) SLE on belimumab - follows with Dr. Trudie Reed, ?cirrhosis, TBI, recent traumatic [place] in 2022  Since her hospitalization where it was eventually felt she had SLE, she has tried the following medications for inflammatory arthritis/SLE:  Benlysta - had been doing well on benlysta but had to change from this due to insurance Saphnelo - had worse arthritis on this, increased DOE.  She says after workup for increased DOE, pleuritic CP she was found to have something wrong with right side of her heart on echo. This has since gradually improved since resuming benlysta a month ago.   Still occasionally gets DOE but pleuritic CP gone. No arthritis, rashes, ulcers in mouth or nose. No trouble swallowing, raynaud's. No miscarriages, blood clots. No bleeding issues since last hospitalization. No longstanding cough.   No orthopnea, BLE swelling, weight gain.   Otherwise pertinent review of systems is negative.  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 She has never lived outside of [place]. Has never taken weight loss medication/ADD/ADHD medication, no drugs. No smoking.     Past Medical History:  Diagnosis Date   Anemia    Phreesia 07/22/2020   Arthritis    Phreesia 07/22/2020   Bell's palsy    Hypothyroidism    Rheumatoid arthritis ([place])    Thyroid disease    Phreesia 07/22/2020     Family History  Problem Relation Age of Onset   Graves' disease Mother    Healthy Father    Diabetes Maternal Grandfather      No past surgical history on file.  Social History   Socioeconomic History   Marital status: Single    Spouse name: Not  on file   Number of children: Not on file   Years of education: Not on file   Highest education level: Not on file  Occupational History   Not on file  Tobacco Use   Smoking status: Never   Smokeless tobacco: Never  Vaping Use   Vaping Use: Never used  Substance and Sexual Activity   Alcohol use: Not Currently   Drug use: Never   Sexual activity: Not on file  Other Topics Concern   Not on file  Social History Narrative   Not on file   Social Determinants of Health   Financial Resource Strain: Not on file  Food Insecurity: Not on file  Transportation Needs: Not on file  Physical Activity: Not on file  Stress: Not on file  Social Connections: Not on file  Intimate Partner Violence: Not on file     Allergies  Allergen Reactions   Other Diarrhea and Other (See Comments)    Omega XL   Rinvoq [Upadacitinib] Other (See Comments)    Enlarged the patient's liver- had to come to the ED, 2022     Outpatient Medications Prior to Visit  Medication Sig Dispense Refill   Belimumab (BENLYSTA) 200 MG/ML SOAJ 200 mg once per wk     levothyroxine (SYNTHROID) 100 MCG tablet Take 100 mcg by mouth daily.     No facility-administered medications prior to visit.       Objective:   Physical Exam:  General appearance: 21 y.o., female, NAD, conversant  Eyes:  anicteric sclerae; PERRL, tracking appropriately HENT: NCAT; MMM Neck: Trachea midline; no lymphadenopathy, no JVD Lungs: CTAB, no crackles, no wheeze, with normal respiratory effort CV: RRR, no murmur  Abdomen: Soft, non-tender; non-distended, BS present  Extremities: No peripheral edema, warm Skin: Normal turgor and texture; no rash Psych: Appropriate affect Neuro: Alert and oriented to person and place, no focal deficit     There were no vitals filed for this visit.    on RA BMI Readings from Last 3 Encounters:  07/22/21 21.77 kg/m  02/02/21 21.91 kg/m  08/27/20 20.49 kg/m   Wt Readings from Last 3  Encounters:  07/22/21 119 lb (54 kg)  02/02/21 119 lb 12.8 oz (54.3 kg)  08/27/20 112 lb (50.8 kg)     CBC    Component Value Date/Time   WBC 5.5 07/14/2020 1335   RBC 3.46 (L) 07/14/2020 1335   HGB 10.8 (L) 07/14/2020 1335   HCT 34.1 (L) 07/14/2020 1335   PLT 319 07/14/2020 1335   MCV 98.6 07/14/2020 1335   MCH 31.2 07/14/2020 1335   MCHC 31.7 07/14/2020 1335   RDW 16.7 (H) 07/14/2020 1335   LYMPHSABS 2.1 07/14/2020 1335   MONOABS 0.3 07/14/2020 1335   EOSABS 0.0 07/14/2020 1335   BASOSABS 0.0 07/14/2020 1335     Chest Imaging: CTA Chest 06/02/21 reviewed by me with a couple foci of band like scarring in bases, no PE  Dr. Verdon Cummins summary of [place] TTE 2023: Mild RAE, Mildly dilated, nonthickened RV with moderately impaired RV systolic function, RVSP of 86 with moderate TR, no intracardiac shunts  BNP 200   Pulmonary Functions Testing Results:     No data to display             Assessment & Plan:   # Mild DOE # Suspected pulmonary hypertension Likely Group I disease related to CTD. Did have steatohepatitis as reaction to upadacitinib, but no evidence of liver fibrosis on liver biopsy 05/2020. Would have WHO Class I to II symptoms. HIV negative last year. Very low suspicion for sleep disordered breathing. No desaturation on walking oximetry today.   Plan: - PFTs  - V/Q scan - right heart cath  RTC after trip to [place] to discuss results   Maryjane Hurter, MD [facility] 09/18/2021 2:39 PM

## 2021-09-21 ENCOUNTER — Ambulatory Visit: Payer: 59

## 2021-09-21 ENCOUNTER — Ambulatory Visit: Payer: 59 | Admitting: Student

## 2021-09-21 DIAGNOSIS — R0609 Other forms of dyspnea: Secondary | ICD-10-CM

## 2021-09-21 LAB — PULMONARY FUNCTION TEST
DL/VA % pred: 98 %
DL/VA: 4.69 ml/min/mmHg/L
DLCO cor % pred: 55 %
DLCO cor: 12.08 ml/min/mmHg
DLCO unc % pred: 55 %
DLCO unc: 12.08 ml/min/mmHg
RV % pred: 132 %
RV: 1.59 L
TLC % pred: 63 %
TLC: 3.17 L

## 2021-09-29 ENCOUNTER — Telehealth (HOSPITAL_COMMUNITY): Payer: Self-pay

## 2021-09-29 NOTE — Telephone Encounter (Signed)
Left message for patient to call office back in regards to scheduling right heart catheterization.

## 2021-09-30 ENCOUNTER — Encounter (HOSPITAL_COMMUNITY): Payer: Self-pay

## 2021-09-30 ENCOUNTER — Other Ambulatory Visit (HOSPITAL_COMMUNITY): Payer: Self-pay

## 2021-09-30 ENCOUNTER — Telehealth (HOSPITAL_COMMUNITY): Payer: Self-pay

## 2021-09-30 DIAGNOSIS — R0600 Dyspnea, unspecified: Secondary | ICD-10-CM

## 2021-09-30 NOTE — Telephone Encounter (Signed)
Spoke to patient regarding Scheduling Right heart catheterization Scheduled for July 27'@12'$  pm. Aware of having someone to drive to and from procedure. Nothing to eat or drink after midnight.

## 2021-10-08 ENCOUNTER — Other Ambulatory Visit (HOSPITAL_COMMUNITY): Payer: Self-pay

## 2021-10-08 ENCOUNTER — Other Ambulatory Visit: Payer: Self-pay

## 2021-10-08 ENCOUNTER — Encounter (HOSPITAL_COMMUNITY)
Admission: RE | Disposition: A | Discharge status: Home / Self Care | Payer: Self-pay | Source: Home / Self Care | Attending: Cardiology

## 2021-10-08 ENCOUNTER — Telehealth (HOSPITAL_COMMUNITY): Payer: Self-pay | Admitting: Pharmacist

## 2021-10-08 ENCOUNTER — Ambulatory Visit (HOSPITAL_COMMUNITY)
Admission: RE | Admit: 2021-10-08 | Discharge: 2021-10-08 | Disposition: A | Discharge status: Home / Self Care | Payer: 59 | Attending: Cardiology | Admitting: Cardiology

## 2021-10-08 DIAGNOSIS — I2721 Secondary pulmonary arterial hypertension: Secondary | ICD-10-CM | POA: Diagnosis present

## 2021-10-08 DIAGNOSIS — I272 Pulmonary hypertension, unspecified: Secondary | ICD-10-CM | POA: Diagnosis not present

## 2021-10-08 DIAGNOSIS — R0600 Dyspnea, unspecified: Secondary | ICD-10-CM

## 2021-10-08 HISTORY — PX: RIGHT HEART CATH: CATH118263

## 2021-10-08 LAB — CBC
HCT: 38.4 % (ref 36.0–46.0)
Hemoglobin: 11.9 g/dL — ABNORMAL LOW (ref 12.0–15.0)
MCH: 25.9 pg — ABNORMAL LOW (ref 26.0–34.0)
MCHC: 31 g/dL (ref 30.0–36.0)
MCV: 83.7 fL (ref 80.0–100.0)
Platelets: 346 10*3/uL (ref 150–400)
RBC: 4.59 MIL/uL (ref 3.87–5.11)
RDW: 16.7 % — ABNORMAL HIGH (ref 11.5–15.5)
WBC: 5.7 10*3/uL (ref 4.0–10.5)
nRBC: 0 % (ref 0.0–0.2)

## 2021-10-08 LAB — POCT I-STAT EG7
Acid-Base Excess: 1 mmol/L (ref 0.0–2.0)
Acid-base deficit: 1 mmol/L (ref 0.0–2.0)
Bicarbonate: 23.4 mmol/L (ref 20.0–28.0)
Bicarbonate: 25.7 mmol/L (ref 20.0–28.0)
Calcium, Ion: 1.26 mmol/L (ref 1.15–1.40)
Calcium, Ion: 1.27 mmol/L (ref 1.15–1.40)
HCT: 35 % — ABNORMAL LOW (ref 36.0–46.0)
HCT: 35 % — ABNORMAL LOW (ref 36.0–46.0)
Hemoglobin: 11.9 g/dL — ABNORMAL LOW (ref 12.0–15.0)
Hemoglobin: 11.9 g/dL — ABNORMAL LOW (ref 12.0–15.0)
O2 Saturation: 67 %
O2 Saturation: 68 %
Potassium: 4 mmol/L (ref 3.5–5.1)
Potassium: 4.1 mmol/L (ref 3.5–5.1)
Sodium: 141 mmol/L (ref 135–145)
Sodium: 142 mmol/L (ref 135–145)
TCO2: 25 mmol/L (ref 22–32)
TCO2: 27 mmol/L (ref 22–32)
pCO2, Ven: 38.7 mmHg — ABNORMAL LOW (ref 44–60)
pCO2, Ven: 40.9 mmHg — ABNORMAL LOW (ref 44–60)
pH, Ven: 7.389 (ref 7.25–7.43)
pH, Ven: 7.407 (ref 7.25–7.43)
pO2, Ven: 35 mmHg (ref 32–45)
pO2, Ven: 36 mmHg (ref 32–45)

## 2021-10-08 LAB — BASIC METABOLIC PANEL
Anion gap: 10 (ref 5–15)
BUN: 10 mg/dL (ref 6–20)
CO2: 23 mmol/L (ref 22–32)
Calcium: 9.2 mg/dL (ref 8.9–10.3)
Chloride: 104 mmol/L (ref 98–111)
Creatinine, Ser: 0.77 mg/dL (ref 0.44–1.00)
GFR, Estimated: >60 mL/min (ref >60)
Glucose, Bld: 88 mg/dL (ref 70–99)
Potassium: 4.2 mmol/L (ref 3.5–5.1)
Sodium: 137 mmol/L (ref 135–145)

## 2021-10-08 LAB — PREGNANCY, URINE: Preg Test, Ur: NEGATIVE

## 2021-10-08 SURGERY — RIGHT HEART CATH
Anesthesia: LOCAL

## 2021-10-08 MED ORDER — ONDANSETRON HCL 4 MG/2ML IJ SOLN: 4.0000 mg | Freq: Four times a day (QID) | INTRAMUSCULAR | Status: DC | PRN

## 2021-10-08 MED ORDER — HYDRALAZINE HCL 20 MG/ML IJ SOLN: 10.0000 mg | INTRAMUSCULAR | Status: DC | PRN

## 2021-10-08 MED ORDER — SODIUM CHLORIDE 0.9 % IV SOLN: 250.0000 mL | INTRAVENOUS | Status: DC | PRN

## 2021-10-08 MED ORDER — SODIUM CHLORIDE 0.9% FLUSH: 3.0000 mL | Freq: Two times a day (BID) | INTRAVENOUS | Status: DC

## 2021-10-08 MED ORDER — LIDOCAINE HCL (PF) 1 % IJ SOLN
INTRAMUSCULAR | Status: DC | PRN
  Administered 2021-10-08: 2 mL

## 2021-10-08 MED ORDER — HEPARIN (PORCINE) IN NACL 1000-0.9 UT/500ML-% IV SOLN
INTRAVENOUS | Status: DC | PRN
  Administered 2021-10-08: 500 mL

## 2021-10-08 MED ORDER — SODIUM CHLORIDE 0.9 % IV SOLN: INTRAVENOUS | Status: DC

## 2021-10-08 MED ORDER — ASPIRIN 81 MG PO CHEW
81.0000 mg | CHEWABLE_TABLET | ORAL | Status: AC
  Administered 2021-10-08: 81 mg via ORAL
  Filled 2021-10-08: qty 1

## 2021-10-08 MED ORDER — ACETAMINOPHEN 325 MG PO TABS: 650.0000 mg | ORAL_TABLET | ORAL | Status: DC | PRN

## 2021-10-08 MED ORDER — HEPARIN (PORCINE) IN NACL 1000-0.9 UT/500ML-% IV SOLN
INTRAVENOUS | Status: AC
  Filled 2021-10-08: qty 500

## 2021-10-08 MED ORDER — SODIUM CHLORIDE 0.9% FLUSH: 3.0000 mL | INTRAVENOUS | Status: DC | PRN

## 2021-10-08 MED ORDER — LABETALOL HCL 5 MG/ML IV SOLN: 10.0000 mg | INTRAVENOUS | Status: DC | PRN

## 2021-10-08 MED ORDER — LIDOCAINE HCL (PF) 1 % IJ SOLN
INTRAMUSCULAR | Status: AC
  Filled 2021-10-08: qty 30

## 2021-10-08 SURGICAL SUPPLY — 7 items
CATH BALLN WEDGE 5F 110CM (CATHETERS) ×1 IMPLANT
PACK CARDIAC CATHETERIZATION (CUSTOM PROCEDURE TRAY) ×2 IMPLANT
SHEATH GLIDE SLENDER 4/5FR (SHEATH) ×1 IMPLANT
TRANSDUCER W/STOPCOCK (MISCELLANEOUS) ×2 IMPLANT
TUBING ART PRESS 72  MALE/FEM (TUBING) ×2
TUBING ART PRESS 72 MALE/FEM (TUBING) IMPLANT
WIRE EMERALD 3MM-J .025X260CM (WIRE) ×1 IMPLANT

## 2021-10-08 NOTE — Telephone Encounter (Signed)
Patient Advocate Encounter   Received notification from [employer] that prior authorization for O[employer]um[employer] is required.   PA submitted on CoverMyMeds Key [id number] Status is pending   Will continue to follow.   Audry Riles, PharmD, BCPS, BCCP, CPP [facility] Pharmacist [phone]

## 2021-10-08 NOTE — H&P (Signed)
Advanced Heart Failure Team History and Physical Note   PCP:  [name], NP  PCP-Cardiology: None     Reason for Admission: RHC   HPI:    Patient with history of SLE and echo concerning for pulmonary hypertension presents for RHC.     Review of Systems: [y] = yes, '[ ]'$  = no   General: Weight gain '[ ]'$ ; Weight loss '[ ]'$ ; Anorexia '[ ]'$ ; Fatigue '[ ]'$ ; Fever '[ ]'$ ; Chills '[ ]'$ ; Weakness '[ ]'$   Cardiac: Chest pain/pressure '[ ]'$ ; Resting SOB '[ ]'$ ; Exertional SOB '[ ]'$ ; Orthopnea '[ ]'$ ; Pedal Edema '[ ]'$ ; Palpitations '[ ]'$ ; Syncope '[ ]'$ ; Presyncope '[ ]'$ ; Paroxysmal nocturnal dyspnea'[ ]'$   Pulmonary: Cough '[ ]'$ ; Wheezing'[ ]'$ ; Hemoptysis'[ ]'$ ; Sputum '[ ]'$ ; Snoring '[ ]'$   GI: Vomiting'[ ]'$ ; Dysphagia'[ ]'$ ; Melena'[ ]'$ ; Hematochezia '[ ]'$ ; Heartburn'[ ]'$ ; Abdominal pain '[ ]'$ ; Constipation '[ ]'$ ; Diarrhea '[ ]'$ ; BRBPR '[ ]'$   GU: Hematuria'[ ]'$ ; Dysuria '[ ]'$ ; Nocturia'[ ]'$   Vascular: Pain in legs with walking '[ ]'$ ; Pain in feet with lying flat '[ ]'$ ; Non-healing sores '[ ]'$ ; Stroke '[ ]'$ ; TIA '[ ]'$ ; Slurred speech '[ ]'$ ;  Neuro: Headaches'[ ]'$ ; Vertigo'[ ]'$ ; Seizures'[ ]'$ ; Paresthesias'[ ]'$ ;Blurred vision '[ ]'$ ; Diplopia '[ ]'$ ; Vision changes '[ ]'$   Ortho/Skin: Arthritis '[ ]'$ ; Joint pain '[ ]'$ ; Muscle pain '[ ]'$ ; Joint swelling '[ ]'$ ; Back Pain '[ ]'$ ; Rash '[ ]'$   Psych: Depression'[ ]'$ ; Anxiety'[ ]'$   Heme: Bleeding problems '[ ]'$ ; Clotting disorders '[ ]'$ ; Anemia '[ ]'$   Endocrine: Diabetes '[ ]'$ ; Thyroid dysfunction'[ ]'$    Home Medications Prior to Admission medications   Medication Sig Start Date End Date Taking? Authorizing Provider  b complex vitamins capsule Take 1 capsule by mouth daily.   Yes [provider]  Belimumab (BENLYSTA) 200 MG/ML SOAJ Inject 200 mg into the skin every Sunday. 06/15/21  Yes [provider]  cholecalciferol (VITAMIN D3) 25 MCG (1000 UNIT) tablet Take 1,000 Units by mouth daily.   Yes [provider]  levothyroxine (SYNTHROID) 112 MCG tablet Take 112 mcg by mouth daily before breakfast. 07/22/21  Yes [provider]   omeprazole (PRILOSEC) 40 MG capsule Take 40 mg by mouth daily.   Yes [provider]    Past Medical History: Past Medical History:  Diagnosis Date   Anemia    Phreesia 07/22/2020   Arthritis    Phreesia 07/22/2020   Bell's palsy    Hypothyroidism    Rheumatoid arthritis ([place])    Thyroid disease    Phreesia 07/22/2020    Past Surgical History: No past surgical history on file.  Family History: Family History  Problem Relation Age of Onset   Graves' disease Mother    Healthy Father    Diabetes Maternal Grandfather     Social History: Social History   Socioeconomic History   Marital status: Single    Spouse name: Not on file   Number of children: Not on file   Years of education: Not on file   Highest education level: Not on file  Occupational History   Not on file  Tobacco Use   Smoking status: Never   Smokeless tobacco: Never  Vaping Use   Vaping Use: Never used  Substance and Sexual Activity   Alcohol use: Not Currently   Drug use: Never   Sexual activity: Not on file  Other Topics Concern   Not on file  Social History Narrative   Not on file  Social Determinants of Health   Financial Resource Strain: Not on file  Food Insecurity: Not on file  Transportation Needs: Not on file  Physical Activity: Not on file  Stress: Not on file  Social Connections: Not on file    Allergies:  Allergies  Allergen Reactions   Other Diarrhea and Other (See Comments)    Omega XL   Rinvoq [Upadacitinib] Other (See Comments)    Enlarged the patient's liver- had to come to the ED, 2022    Objective:    Vital Signs:   Temp:  [97.6 F (36.4 C)] 97.6 F (36.4 C) (07/27 1232) Pulse Rate:  [100] 100 (07/27 1232) Resp:  [20] 20 (07/27 1232) BP: (112)/(79) 112/79 (07/27 1232) SpO2:  [100 %] 100 % (07/27 1351) Weight:  [53.5 kg] 53.5 kg (07/27 1232)   Filed Weights   10/08/21 1232  Weight: 53.5 kg     Physical Exam     General:  Well appearing.  No respiratory difficulty HEENT: Normal Neck: Supple. no JVD. Carotids 2+ bilat; no bruits. No lymphadenopathy or thyromegaly appreciated. Cor: PMI nondisplaced. Regular rate & rhythm. No rubs, gallops or murmurs. Lungs: Clear Abdomen: Soft, nontender, nondistended. No hepatosplenomegaly. No bruits or masses. Good bowel sounds. Extremities: No cyanosis, clubbing, rash, edema Neuro: Alert & oriented x 3, cranial nerves grossly intact. moves all 4 extremities w/o difficulty. Affect pleasant.   Telemetry   NSR  Labs     Basic Metabolic Panel: Recent Labs  Lab 10/08/21 1306  NA 137  K 4.2  CL 104  CO2 23  GLUCOSE 88  BUN 10  CREATININE 0.77  CALCIUM 9.2    Liver Function Tests: No results for input(s): "AST", "ALT", "ALKPHOS", "BILITOT", "PROT", "ALBUMIN" in the last 168 hours. No results for input(s): "LIPASE", "AMYLASE" in the last 168 hours. No results for input(s): "AMMONIA" in the last 168 hours.  CBC: Recent Labs  Lab 10/08/21 1306  WBC 5.7  HGB 11.9*  HCT 38.4  MCV 83.7  PLT 346    Cardiac Enzymes: No results for input(s): "CKTOTAL", "CKMB", "CKMBINDEX", "TROPONINI" in the last 168 hours.  BNP: BNP (last 3 results) No results for input(s): "BNP" in the last 8760 hours.  ProBNP (last 3 results) No results for input(s): "PROBNP" in the last 8760 hours.   CBG: No results for input(s): "GLUCAP" in the last 168 hours.  Coagulation Studies: No results for input(s): "LABPROT", "INR" in the last 72 hours.  Imaging: No results found.    Assessment/Plan   H/o SLE with echo showing pulmonary hypertension, needs confirmation by RHC to be done today.    Loralie Champagne, MD 10/08/2021, 2:07 PM  Advanced Heart Failure Team Pager [phone] (M-F; 7a - 5p)  Please contact [facility] for night-coverage after hours (4p -7a ) and weekends on [email or web address]

## 2021-10-09 ENCOUNTER — Encounter (HOSPITAL_COMMUNITY): Payer: Self-pay | Admitting: Cardiology

## 2021-10-09 NOTE — Telephone Encounter (Signed)
Advanced Heart Failure Patient Advocate Encounter  Prior Authorization for Opsumit has been approved.    PA# [id number] Effective dates: 10/08/21 through 10/09/22  Audry Riles, PharmD, BCPS, BCCP, CPP Heart Failure Clinic Pharmacist [phone]

## 2021-10-12 ENCOUNTER — Encounter: Payer: Self-pay | Admitting: Pulmonary Disease

## 2021-10-12 ENCOUNTER — Ambulatory Visit: Payer: 59 | Admitting: Pulmonary Disease

## 2021-10-12 VITALS — BP 118/70 | HR 91 | Temp 98.4°F | Ht 62.0 in | Wt 118.8 lb

## 2021-10-12 DIAGNOSIS — I2721 Secondary pulmonary arterial hypertension: Secondary | ICD-10-CM | POA: Diagnosis not present

## 2021-10-12 MED ORDER — FLUTICASONE PROPIONATE 50 MCG/ACT NA SUSP
NASAL | 2 refills | Status: DC
Start: 2021-10-12 — End: 2021-11-18

## 2021-10-12 MED ORDER — CETIRIZINE HCL 10 MG PO TABS
10.0000 mg | ORAL_TABLET | Freq: Every day | ORAL | 3 refills | Status: DC
Start: 2021-10-12 — End: 2021-11-18

## 2021-10-12 NOTE — Progress Notes (Signed)
$'@Patient'G$  ID: Jessica Frazier, female    DOB: 2001/03/03, 21 y.o.   MRN: [id number]  Chief Complaint  Patient presents with   Follow-up    Doing well    Referring provider: [name], NP  HPI:   21 y.o. woman whom we are seeing as a new patient evaluation for pulmonary arterial hypertension.  Most recent pulmonary note reviewed.  Patient notes several months of dyspnea on exertion.  Associated with medication change treating lupus.  She has history of seronegative RA and ANA positive lupus 1:2560.  As part of work-up of dyspnea she had an echocardiogram performed.  I cannot see the results of this.  Reportedly showed signs of pulmonary hypertension.  As part of the work-up she had PFTs performed 09/21/2021 demonstrated moderate restriction as well as severe air trapping and moderate reduced DLCO.  Consider VQ scan in the interim fortunately no sign of PE.  She had a right heart catheterization 10/08/2021 that showed mean PA pressure 46 with a wedge of 2 with a PVR of 11.6 Wood units, cardiac output 3.8, cardiac index 2.5, normal right-sided filling pressures.  Majority of visit he was spent discussing etiology of pulmonary hypertension in general as it relates to her.  The treatment strategy for pulmonary arterial hypertension in general and how it relates to her.  Questionaires / Pulmonary Flowsheets:   ACT:      No data to display          MMRC:     No data to display          Epworth:      No data to display          Tests:   FENO:  No results found for: "NITRICOXIDE"  PFT:    Latest Ref Rng & Units 09/21/2021    3:03 PM  PFT Results  DLCO uncorrected ml/min/mmHg 12.08   DLCO UNC% % 55   DLCO corrected ml/min/mmHg 12.08   DLCO COR %Predicted % 55   DLVA Predicted % 98   TLC L 3.17   TLC % Predicted % 63   RV % Predicted % 132     WALK:     07/22/2021    3:10 PM  SIX MIN WALK  Supplimental Oxygen during Test? (L/min) No   Tech Comments: averag pace/no SOB/lmr    Imaging: CARDIAC CATHETERIZATION  Result Date: 10/08/2021 1. Severe pulmonary arterial hypertension, PVR 11.6 WU. 2. Normal right and left heart filling pressures. 3. Preserved cardiac output. 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  V/Q scan negative for chronic PE.  Will need high resolution CT chest to assess parenchymal lung disease.  Suspect she will need treatment with selective pulmonary vasodilators, will start working on this.    Lab Results:  CBC    Component Value Date/Time   WBC 5.7 10/08/2021 1306   RBC 4.59 10/08/2021 1306   HGB 11.9 (L) 10/08/2021 1424   HGB 11.9 (L) 10/08/2021 1424   HCT 35.0 (L) 10/08/2021 1424   HCT 35.0 (L) 10/08/2021 1424   PLT 346 10/08/2021 1306   MCV 83.7 10/08/2021 1306   MCH 25.9 (L) 10/08/2021 1306   MCHC 31.0 10/08/2021 1306   RDW 16.7 (H) 10/08/2021 1306   LYMPHSABS 2.1 07/14/2020 1335   MONOABS 0.3 07/14/2020 1335   EOSABS 0.0 07/14/2020 1335   BASOSABS 0.0 07/14/2020 1335    BMET    Component Value Date/Time   NA 141 10/08/2021 1424  NA 142 10/08/2021 1424   K 4.0 10/08/2021 1424   K 4.1 10/08/2021 1424   CL 104 10/08/2021 1306   CO2 23 10/08/2021 1306   GLUCOSE 88 10/08/2021 1306   BUN 10 10/08/2021 1306   CREATININE 0.77 10/08/2021 1306   CREATININE 0.60 07/14/2020 1335   CALCIUM 9.2 10/08/2021 1306   GFRNONAA >60 10/08/2021 1306   GFRNONAA >60 07/14/2020 1335    BNP No results found for: "BNP"  ProBNP No results found for: "PROBNP"  Specialty Problems   None   Allergies  Allergen Reactions   Other Diarrhea and Other (See Comments)    Omega XL   Rinvoq [Upadacitinib] Other (See Comments)    Enlarged the patient's liver- had to come to the ED, 2022     There is no immunization history on file for this patient.  Past Medical History:  Diagnosis Date   Anemia    Phreesia 07/22/2020   Arthritis    Phreesia 07/22/2020   Bell's palsy    Hypothyroidism     Rheumatoid arthritis ([place])    Thyroid disease    Phreesia 07/22/2020    Tobacco History: Social History   Tobacco Use  Smoking Status Never  Smokeless Tobacco Never   Counseling given: Not Answered   Continue to not smoke  Outpatient Encounter Medications as of 10/12/2021  Medication Sig   b complex vitamins capsule Take 1 capsule by mouth daily.   Belimumab (BENLYSTA) 200 MG/ML SOAJ Inject 200 mg into the skin every Sunday.   cetirizine (ZYRTEC ALLERGY) 10 MG tablet Take 1 tablet (10 mg total) by mouth daily.   cholecalciferol (VITAMIN D3) 25 MCG (1000 UNIT) tablet Take 1,000 Units by mouth daily.   fluticasone (FLONASE) 50 MCG/ACT nasal spray 2 sprays each nostril twice a day for 1 week then decrease to 1 spray each nostril twice a day   levothyroxine (SYNTHROID) 112 MCG tablet Take 112 mcg by mouth daily before breakfast.   omeprazole (PRILOSEC) 40 MG capsule Take 40 mg by mouth daily.   No facility-administered encounter medications on file as of 10/12/2021.     Review of Systems  Review of Systems   Physical Exam  BP 118/70 (BP Location: Left Arm, Patient Position: Sitting, Cuff Size: Normal)   Pulse 91   Temp 98.4 F (36.9 C) (Oral)   Ht '5\' 2"'$  (1.575 m)   Wt 118 lb 12.8 oz (53.9 kg)   LMP 09/22/2021 (Approximate)   SpO2 100%   BMI 21.73 kg/m   Wt Readings from Last 5 Encounters:  10/12/21 118 lb 12.8 oz (53.9 kg)  10/08/21 118 lb (53.5 kg)  07/22/21 119 lb (54 kg)  02/02/21 119 lb 12.8 oz (54.3 kg)  08/27/20 112 lb (50.8 kg)    BMI Readings from Last 5 Encounters:  10/12/21 21.73 kg/m  10/08/21 21.58 kg/m  07/22/21 21.77 kg/m  02/02/21 21.91 kg/m  08/27/20 20.49 kg/m     Physical Exam General: Well-appearing, no acute distress Eyes: EOMI, icterus Neck: Supple, no JVP Dyspnea   Assessment & Plan:   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  NYHA class II symptoms.  Right heart  catheterization 09/2021 with Mean PA pressure 46, wedge 2, PVR 11.6 WU, normal right-sided filling pressures, cardiac output 3.8, cardiac index 2.5.  Plan for Opsumit in place.  Agree with this.  Would titrate up and add tadalafil shortly thereafter.  We discussed need for 2 forms of contraception as  well as needing frequent urine pregnancy test while on therapy.  She states this is fine with her.  We also discussed that getting pregnant is very dangerous, life-threatening to her and also baby.  Counseled again about getting pregnant in the future.  She expressed understanding and seemed okay with this, is okay as we can be with such difficulties.  Suspect would benefit from adding prostacyclin therapy in the future given her youth, otherwise healthy.  Will liaison with Dr. Aundra Dubin and help arrange follow-up another therapeutic trials in the future.  CT chest high-resolution ordered today to round out work-up.     Return if symptoms worsen or fail to improve.   Lanier Clam, MD 10/12/2021   This appointment required 65 minutes of patient care (this includes precharting, chart review, review of results, face-to-face care, etc.).

## 2021-10-12 NOTE — Patient Instructions (Signed)
It is nice to meet you  We discussed today a bit about pulmonary hypertension and specifically pulmonary arterial hypertension which is one of the many causes of high blood pressure going into the lungs.  I agree with Dr. Aundra Dubin, we should start medicines to help reduce blood pressure and stress on your heart.  It looks like they are in the process of starting obtaining medicines for you.  For the cough and voice hoarseness, it sounds like the reflux medicine has not helped.  Take Flonase 2 spray each nostril twice a day for 1 week then decrease to 1 spray each nostril twice a day thereafter.  In addition, I recommend taking Zyrtec at night.  See if this helps decrease the nasal congestion and sensation of mucus dripping down the back of your throat and hopefully improve the cough and hoarseness.  Return to clinic as needed, happy to see you at any time, I will send a message to Dr. Aundra Dubin and we will discuss how best to follow-up in the future.

## 2021-10-13 ENCOUNTER — Ambulatory Visit: Payer: 59 | Admitting: Pulmonary Disease

## 2021-10-14 ENCOUNTER — Telehealth (HOSPITAL_COMMUNITY): Payer: Self-pay | Admitting: Pharmacy Technician

## 2021-10-14 ENCOUNTER — Other Ambulatory Visit (HOSPITAL_COMMUNITY): Payer: Self-pay

## 2021-10-14 NOTE — Telephone Encounter (Signed)
Advanced Heart Failure Patient Advocate Encounter  Called and spoke with patient regarding Opsumit referral. Sent patient PSMN, REMS and medication guide via docusign.  Will fax in once all signatures are obtained.

## 2021-10-19 ENCOUNTER — Ambulatory Visit (HOSPITAL_BASED_OUTPATIENT_CLINIC_OR_DEPARTMENT_OTHER): Admission: RE | Admit: 2021-10-19 | Payer: Self-pay | Source: Ambulatory Visit

## 2021-10-19 ENCOUNTER — Ambulatory Visit (HOSPITAL_BASED_OUTPATIENT_CLINIC_OR_DEPARTMENT_OTHER): Payer: 59

## 2021-10-21 ENCOUNTER — Encounter (HOSPITAL_COMMUNITY): Payer: 59 | Admitting: Cardiology

## 2021-10-27 NOTE — Telephone Encounter (Signed)
Resent docusign link for Opsumit. Called and left the patient a message regarding paperwork.

## 2021-10-29 ENCOUNTER — Telehealth: Payer: Self-pay | Admitting: Pulmonary Disease

## 2021-10-29 NOTE — Telephone Encounter (Addendum)
Pt has gotten new insurance.  CT scheduled for 11/03/21 has been cancelled - [facility] is OON with pt's new insurance.  Taryn to r/s to [facility].  I have emailed her the authorization. I made pt aware CT has been cancelled & to expect a phone call tomorrow about the new appt date.

## 2021-10-30 NOTE — Telephone Encounter (Signed)
Order has been faxed to [facility] confirmation recived they will contact the patient

## 2021-11-03 ENCOUNTER — Other Ambulatory Visit (HOSPITAL_COMMUNITY): Payer: Self-pay

## 2021-11-03 ENCOUNTER — Ambulatory Visit (HOSPITAL_BASED_OUTPATIENT_CLINIC_OR_DEPARTMENT_OTHER): Admission: RE | Admit: 2021-11-03 | Payer: BLUE CROSS/BLUE SHIELD | Source: Ambulatory Visit

## 2021-11-06 NOTE — Telephone Encounter (Signed)
Advanced Heart Failure Patient Advocate Encounter  Sent in REMS, PSMN and PA approval via fax.  Will follow up.

## 2021-11-11 ENCOUNTER — Other Ambulatory Visit (HOSPITAL_COMMUNITY): Payer: Self-pay

## 2021-11-11 ENCOUNTER — Telehealth (HOSPITAL_COMMUNITY): Payer: Self-pay | Admitting: Pharmacy Technician

## 2021-11-11 NOTE — Telephone Encounter (Signed)
Patient Advocate Encounter   Received notification from [facility] that prior authorization for Opsumit is required.   PA submitted on CoverMyMeds Key [id number] Status is pending   Will continue to follow.

## 2021-11-18 ENCOUNTER — Encounter (HOSPITAL_COMMUNITY): Payer: Self-pay | Admitting: Cardiology

## 2021-11-18 ENCOUNTER — Ambulatory Visit (HOSPITAL_COMMUNITY)
Admission: RE | Admit: 2021-11-18 | Discharge: 2021-11-18 | Disposition: A | Discharge status: Home / Self Care | Payer: BLUE CROSS/BLUE SHIELD | Source: Ambulatory Visit | Attending: Cardiology | Admitting: Cardiology

## 2021-11-18 VITALS — BP 94/58 | HR 102 | Wt 119.6 lb

## 2021-11-18 DIAGNOSIS — M0600 Rheumatoid arthritis without rheumatoid factor, unspecified site: Secondary | ICD-10-CM | POA: Diagnosis not present

## 2021-11-18 DIAGNOSIS — I272 Pulmonary hypertension, unspecified: Secondary | ICD-10-CM | POA: Diagnosis present

## 2021-11-18 DIAGNOSIS — M329 Systemic lupus erythematosus, unspecified: Secondary | ICD-10-CM | POA: Insufficient documentation

## 2021-11-18 LAB — BRAIN NATRIURETIC PEPTIDE: B Natriuretic Peptide: 100.5 pg/mL — ABNORMAL HIGH (ref 0.0–100.0)

## 2021-11-18 LAB — BASIC METABOLIC PANEL
Anion gap: 11 (ref 5–15)
BUN: 8 mg/dL (ref 6–20)
CO2: 21 mmol/L — ABNORMAL LOW (ref 22–32)
Calcium: 10 mg/dL (ref 8.9–10.3)
Chloride: 103 mmol/L (ref 98–111)
Creatinine, Ser: 0.73 mg/dL (ref 0.44–1.00)
GFR, Estimated: >60 mL/min (ref >60)
Glucose, Bld: 83 mg/dL (ref 70–99)
Potassium: 3.6 mmol/L (ref 3.5–5.1)
Sodium: 135 mmol/L (ref 135–145)

## 2021-11-18 LAB — TSH: TSH: 6.408 u[IU]/mL — ABNORMAL HIGH (ref 0.350–4.500)

## 2021-11-18 MED ORDER — TADALAFIL (PAH) 20 MG PO TABS: 20.0000 mg | ORAL_TABLET | Freq: Every day | ORAL | 3 refills | Status: DC

## 2021-11-18 MED ORDER — OPSUMIT 10 MG PO TABS: 10.0000 mg | ORAL_TABLET | Freq: Every day | ORAL | Status: DC

## 2021-11-18 NOTE — Addendum Note (Signed)
Encounter addended by: Rockwell Alexandria, CMA on: 11/18/2021 4:07 PM  Actions taken: Clinical Note Signed

## 2021-11-18 NOTE — Patient Instructions (Addendum)
Start Opsumit 10 mg daily  Start Tadalafil 20 mg daily 1 week after you start Opsumit.  Labs done today, your results will be available in MyChart, we will contact you for abnormal readings.  Your physician has requested that you have an echocardiogram. Echocardiography is a painless test that uses sound waves to create images of your heart. It provides your doctor with information about the size and shape of your heart and how well your heart's chambers and valves are working. This procedure takes approximately one hour. There are no restrictions for this procedure.  Your physician recommends that you schedule a follow-up appointment in: 6 weeks  If you have any questions or concerns before your next appointment please send [place] a message through [place] or call our office at [phone].    TO LEAVE A MESSAGE FOR THE NURSE SELECT OPTION 2, PLEASE LEAVE A MESSAGE INCLUDING: YOUR NAME DATE OF BIRTH CALL BACK NUMBER REASON FOR CALL**this is important as we prioritize the call backs  YOU WILL RECEIVE A CALL BACK THE SAME DAY AS LONG AS YOU CALL BEFORE 4:00 PM  At the [facility], you and your health needs are our priority. As part of our continuing mission to provide you with exceptional heart care, we have created designated Provider Care Teams. These Care Teams include your primary Cardiologist (physician) and Advanced Practice Providers (APPs- Physician Assistants and Nurse Practitioners) who all work together to provide you with the care you need, when you need it.   You may see any of the following providers on your designated Care Team at your next follow up: Dr Glori Bickers Dr Loralie Champagne Dr. Roxana Hires, NP Lyda Jester, [place] [facility] [place], [place] Forestine Na, NP Audry Riles, PharmD   Please be sure to bring in all your medications bottles to every appointment.

## 2021-11-18 NOTE — Telephone Encounter (Signed)
Advanced Heart Failure Patient Advocate Encounter  Prior Authorization for Opsumit has been approved.    Effective dates: 11/11/21 through 11/10/22  Charlann Boxer, CPhT

## 2021-11-18 NOTE — Progress Notes (Signed)
6 Min Walk Test Completed  Pt ambulated 1,732 ft to 527.91 meters.

## 2021-11-18 NOTE — Telephone Encounter (Signed)
Advanced Heart Failure Patient Advocate Encounter  Called [facility] to check the status of the patient's application. Representative stated that [employer] spoke with the patient last Friday. The patient did not want to schedule a shipment at the time. Stated that the patient is deciding if she wants to start therapy. They will reach back out to the patient. Will also inform provider.

## 2021-11-19 ENCOUNTER — Telehealth (HOSPITAL_COMMUNITY): Payer: Self-pay | Admitting: Pharmacy Technician

## 2021-11-19 NOTE — Telephone Encounter (Signed)
Patient Advocate Encounter   Received notification from [facility] that prior authorization for Tadalafil [facility]) is required.   PA submitted on CoverMyMeds Key [id number] Status is pending   Will continue to follow.

## 2021-11-19 NOTE — Progress Notes (Signed)
PCP: [name], NP Rheumatology: Dr. Lenna Gilford Pulmonary: Dr. Silas Flood Cardiology: Dr. Aundra Dubin  21 y.o. with history of SLE and seronegative rheumatoid arthritis was found to have pulmonary hypertension.  SLE is followed by Dr. Lenna Gilford.  She is on belimumab.  Earlier this year, she developed exertional dyspnea.  Her primary care physician ordered an echo, which showed mildly dilated RV with mild systolic dysfunction and PASP 86.  She was referred to pulmonary for evaluation.  PFTs showed moderate restriction with decreased DLCO. V/Q scan showed no evidence for chronic PE.  RHC in 7/23 showed severe pulmonary arterial hypertension.   She is now less short of breath than earlier this year.  No dyspnea walking on flat ground.  No chest pain.  No lightheadedness or syncope.  She has not yet started on selective pulmonary vasodilators as she has questions about them.   6 minute walk: 528 m  ECG (personally reviewed): Sinus tachy 103, right axis deviation, RVH, diffuse TWIs  PMH: 1. SLE: ANA 1:2560.   2. Seronegative rheumatoid arthritis.  3. Traumatic SAH: 2022.  4. 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  Echo (2023, [facility]) showed mild RV dilation with mild RV dysfunction, PASP 86 mmHg.  - RHC (7/23): mean RA 6, PA 70/31 mean 46, mean PCWP 2, CI 2.49, PVR 11.6 WU, PAPi 6.5.  - PFTs (7/23): Moderate restriction, moderately decreased DLCO.  - V/Q scan (7/23): No acute or chronic PE.  5. Hypothryroidism  Social History   Socioeconomic History   Marital status: Single    Spouse name: Not on file   Number of children: Not on file   Years of education: Not on file   Highest education level: Not on file  Occupational History   Not on file  Tobacco Use   Smoking status: Never   Smokeless tobacco: Never  Vaping Use   Vaping Use: Never used  Substance and Sexual Activity   Alcohol use: Not Currently   Drug use: Never   Sexual activity: Not on file  Other Topics  Concern   Not on file  Social History Narrative   Not on file   Social Determinants of Health   Financial Resource Strain: Not on file  Food Insecurity: Not on file  Transportation Needs: Not on file  Physical Activity: Not on file  Stress: Not on file  Social Connections: Not on file  Intimate Partner Violence: Not on file   Family History  Problem Relation Age of Onset   Graves' disease Mother    Healthy Father    Diabetes Maternal Grandfather    ROS: All systems reviewed and negative except as per HPI.   Current Outpatient Medications  Medication Sig Dispense Refill   b complex vitamins capsule Take 1 capsule by mouth daily.     Belimumab (BENLYSTA) 200 MG/ML SOAJ Inject 200 mg into the skin every Sunday.     cholecalciferol (VITAMIN D3) 25 MCG (1000 UNIT) tablet Take 1,000 Units by mouth daily.     levothyroxine (SYNTHROID) 112 MCG tablet Take 112 mcg by mouth daily before breakfast.     macitentan (OPSUMIT) 10 MG tablet Take 1 tablet (10 mg total) by mouth daily. 30 tablet    tadalafil, PAH, (ADCIRCA) 20 MG tablet Take 1 tablet (20 mg total) by mouth daily. 90 tablet 3   No current facility-administered medications for this encounter.   BP (!) 94/58   Pulse (!) 102   Wt 54.3 kg (119 lb 9.6 oz)  SpO2 99%   BMI 21.88 kg/m  General: NAD Neck: No JVD, no thyromegaly or thyroid nodule.  Lungs: Clear to auscultation bilaterally with normal respiratory effort. CV: Nondisplaced PMI.  Heart regular S1/S2, no S3/S4, no murmur.  No peripheral edema.  No carotid bruit.  Normal pedal pulses.  Abdomen: Soft, nontender, no hepatosplenomegaly, no distention.  Skin: Intact without lesions or rashes.  Neurologic: Alert and oriented x 3.  Psych: Normal affect. Extremities: No clubbing or cyanosis.  HEENT: Normal.   Assessment/Plan: 1. Pulmonary hypertension: Patient had severe PAH by 7/23 RHC.  Echo in 2023 showed mildly dilated RV with mild RV systolic dysfunction, PASP was  elevated at 86 mmHg.  V/Q scan did not suggest the presence of chronic PEs.  HIV negative.  No known liver disease.  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  Good 6 minute walk today.  I think she is going to need aggressive treatment of her PH via pulmonary vasodilators.  Filling pressures were normal on RHC and cardiac output was preserved.  We will begin treatment with oral combination therapy.   - She will start Opsumit 10 mg daily.  - She will start tadafil 20 mg daily about a week after Opsumit and titrate up to 40 mg daily.  - We will next add Uptravi, likely after next appt.   - We discussed possible side effects of the medications.  She understands the need for contraception and will get monthly pregnancy tests.  She understands the high risk of becoming pregnant in the setting of severe PAH.  - I am going to arrange for a repeat echo in our system.  - High resolution CT chest ordered to rule out significant lung parenchymal disease.  - She will need a sleep study.  2. SLE: On belimumab.  Followed by Dr. Lenna Gilford.   Followup in 6 wks with echo.   Loralie Champagne 11/19/2021

## 2021-11-19 NOTE — Addendum Note (Signed)
Encounter addended by: Larey Dresser, MD on: 11/19/2021 10:44 PM  Actions taken: Clinical Note Signed, Level of Service modified

## 2021-11-23 ENCOUNTER — Other Ambulatory Visit (HOSPITAL_COMMUNITY): Payer: Self-pay

## 2021-11-23 NOTE — Telephone Encounter (Signed)
Advanced Heart Failure Patient Advocate Encounter  Prior Authorization for Tadalafil [facility]) has been approved.    Effective dates: 11/19/21 through 11/18/22  Patients co-pay is $50 (insurance allows 30 day billing)  Charlann Boxer, CPhT

## 2021-11-27 NOTE — Telephone Encounter (Signed)
Advanced Heart Failure Patient Advocate Encounter  Received confirmation that Opsumit free trial has been sent to the patient through [employer] on 11/19/21.  Charlann Boxer, CPhT

## 2021-12-18 ENCOUNTER — Other Ambulatory Visit (HOSPITAL_COMMUNITY): Payer: Self-pay

## 2021-12-18 ENCOUNTER — Encounter (HOSPITAL_COMMUNITY): Payer: Self-pay | Admitting: Cardiology

## 2021-12-18 ENCOUNTER — Ambulatory Visit (HOSPITAL_BASED_OUTPATIENT_CLINIC_OR_DEPARTMENT_OTHER)
Admission: RE | Admit: 2021-12-18 | Discharge: 2021-12-18 | Disposition: A | Discharge status: Home / Self Care | Payer: BLUE CROSS/BLUE SHIELD | Source: Ambulatory Visit | Attending: Cardiology | Admitting: Cardiology

## 2021-12-18 ENCOUNTER — Other Ambulatory Visit (HOSPITAL_COMMUNITY): Payer: BLUE CROSS/BLUE SHIELD

## 2021-12-18 ENCOUNTER — Ambulatory Visit (HOSPITAL_COMMUNITY)
Admission: RE | Admit: 2021-12-18 | Discharge: 2021-12-18 | Disposition: A | Discharge status: Home / Self Care | Payer: BLUE CROSS/BLUE SHIELD | Source: Ambulatory Visit | Attending: Cardiology | Admitting: Cardiology

## 2021-12-18 VITALS — BP 94/60 | HR 110 | Wt 115.6 lb

## 2021-12-18 DIAGNOSIS — Z3A00 Weeks of gestation of pregnancy not specified: Secondary | ICD-10-CM | POA: Diagnosis not present

## 2021-12-18 DIAGNOSIS — O99891 Other specified diseases and conditions complicating pregnancy: Secondary | ICD-10-CM | POA: Diagnosis present

## 2021-12-18 DIAGNOSIS — I509 Heart failure, unspecified: Secondary | ICD-10-CM | POA: Insufficient documentation

## 2021-12-18 DIAGNOSIS — M329 Systemic lupus erythematosus, unspecified: Secondary | ICD-10-CM | POA: Diagnosis not present

## 2021-12-18 DIAGNOSIS — I272 Pulmonary hypertension, unspecified: Secondary | ICD-10-CM | POA: Diagnosis not present

## 2021-12-18 DIAGNOSIS — O0990 Supervision of high risk pregnancy, unspecified, unspecified trimester: Secondary | ICD-10-CM | POA: Diagnosis not present

## 2021-12-18 LAB — ECHOCARDIOGRAM COMPLETE
AR max vel: 1.82 cm2
AV Area VTI: 1.81 cm2
AV Area mean vel: 1.74 cm2
AV Mean grad: 3 mmHg
AV Peak grad: 5.5 mmHg
Ao pk vel: 1.17 m/s
Area-P 1/2: 5.02 cm2
S' Lateral: 1.7 cm

## 2021-12-18 MED ORDER — TADALAFIL (PAH) 20 MG PO TABS: 20.0000 mg | ORAL_TABLET | Freq: Every day | ORAL | 11 refills | Status: DC

## 2021-12-18 NOTE — Patient Instructions (Signed)
Start Tadaafil 20 mg daily.  Please arrange to see your OBGYN  ASAP  Your physician recommends that you schedule a follow-up appointment in: 3 weeks  If you have any questions or concerns before your next appointment please send [place] a message through [place] or call our office at [phone].    TO LEAVE A MESSAGE FOR THE NURSE SELECT OPTION 2, PLEASE LEAVE A MESSAGE INCLUDING: YOUR NAME DATE OF BIRTH CALL BACK NUMBER REASON FOR CALL**this is important as we prioritize the call backs  YOU WILL RECEIVE A CALL BACK THE SAME DAY AS LONG AS YOU CALL BEFORE 4:00 PM  At the [facility], you and your health needs are our priority. As part of our continuing mission to provide you with exceptional heart care, we have created designated Provider Care Teams. These Care Teams include your primary Cardiologist (physician) and Advanced Practice Providers (APPs- Physician Assistants and Nurse Practitioners) who all work together to provide you with the care you need, when you need it.   You may see any of the following providers on your designated Care Team at your next follow up: Dr Glori Bickers Dr Loralie Champagne Dr. Roxana Hires, NP Lyda Jester, [place] [facility] [place], [place] Forestine Na, NP Audry Riles, PharmD   Please be sure to bring in all your medications bottles to every appointment.

## 2021-12-18 NOTE — Progress Notes (Signed)
  Echocardiogram 2D Echocardiogram has been performed.  Jessica Frazier F 12/18/2021, 2:11 PM

## 2021-12-20 NOTE — Progress Notes (Signed)
PCP: [name], NP Rheumatology: Dr. Lenna Gilford Pulmonary: Dr. Silas Flood Cardiology: Dr. Aundra Dubin  21 y.o. with history of SLE and seronegative rheumatoid arthritis was found to have pulmonary hypertension.  SLE is followed by Dr. Lenna Gilford.  She is on belimumab.  Earlier this year, she developed exertional dyspnea.  Her primary care physician ordered an echo, which showed mildly dilated RV with mild systolic dysfunction and PASP 86.  She was referred to pulmonary for evaluation.  PFTs showed moderate restriction with decreased DLCO. V/Q scan showed no evidence for chronic PE.  RHC in 7/23 showed severe pulmonary arterial hypertension.   Patient recently found out that she is about [redacted] weeks pregnant.  She was seen in the ER yesterday with abnormal uterine bleeding and pregnancy test was positive.  There is some concern that she may have miscarried.   Echo was done today and reviewed, EF 55-60%, D-shaped septum, moderate RV enlargement with moderate RV dysfunction, PASP 124 mmHg.   She returns for followup of pulmonary hypertension.  She has received Opsumit but did not start it due to finding out she is pregnant.  She has not received tadalafil yet.  She is still doing well overall, dyspnea only with fast walking.  No lightheadedness or dizziness.  No chest pain.    6 minute walk (9/23): 528 m  PMH: 1. SLE: ANA 1:2560.   2. Seronegative rheumatoid arthritis.  3. Traumatic SAH: 2022.  4. 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  Echo (2023, [facility]) showed mild RV dilation with mild RV dysfunction, PASP 86 mmHg.  - RHC (7/23): mean RA 6, PA 70/31 mean 46, mean PCWP 2, CI 2.49, PVR 11.6 WU, PAPi 6.5.  - PFTs (7/23): Moderate restriction, moderately decreased DLCO.  - V/Q scan (7/23): No acute or chronic PE.  - Echo (10/23): EF 55-60%, D-shaped septum, moderate RV enlargement with moderate RV dysfunction, PASP 124 mmHg.  5. Hypothryroidism  Social History   Socioeconomic History    Marital status: Single    Spouse name: Not on file   Number of children: Not on file   Years of education: Not on file   Highest education level: Not on file  Occupational History   Not on file  Tobacco Use   Smoking status: Never   Smokeless tobacco: Never  Vaping Use   Vaping Use: Never used  Substance and Sexual Activity   Alcohol use: Not Currently   Drug use: Never   Sexual activity: Not on file  Other Topics Concern   Not on file  Social History Narrative   Not on file   Social Determinants of Health   Financial Resource Strain: Not on file  Food Insecurity: Not on file  Transportation Needs: Not on file  Physical Activity: Not on file  Stress: Not on file  Social Connections: Not on file  Intimate Partner Violence: Not on file   Family History  Problem Relation Age of Onset   Graves' disease Mother    Healthy Father    Diabetes Maternal Grandfather    ROS: All systems reviewed and negative except as per HPI.   Current Outpatient Medications  Medication Sig Dispense Refill   b complex vitamins capsule Take 1 capsule by mouth daily.     cholecalciferol (VITAMIN D3) 25 MCG (1000 UNIT) tablet Take 1,000 Units by mouth daily.     levothyroxine (SYNTHROID) 112 MCG tablet Take 112 mcg by mouth daily before breakfast.     Belimumab (BENLYSTA) 200 MG/ML  SOAJ Inject 200 mg into the skin every Sunday. (Patient not taking: Reported on 12/18/2021)     tadalafil, PAH, (ADCIRCA) 20 MG tablet Take 1 tablet (20 mg total) by mouth daily. 60 tablet 11   No current facility-administered medications for this encounter.   BP 94/60   Pulse (!) 110   Wt 52.4 kg (115 lb 9.6 oz)   SpO2 95%   BMI 21.14 kg/m  General: NAD Neck: No JVD, no thyromegaly or thyroid nodule.  Lungs: Clear to auscultation bilaterally with normal respiratory effort. CV: Nondisplaced PMI.  Heart regular S1/S2 with loud P2, no S3/S4, no murmur.  No peripheral edema.  No carotid bruit.  Normal pedal  pulses.  Abdomen: Soft, nontender, no hepatosplenomegaly, no distention.  Skin: Intact without lesions or rashes.  Neurologic: Alert and oriented x 3.  Psych: Normal affect. Extremities: No clubbing or cyanosis.  HEENT: Normal.   Assessment/Plan: 1. Pulmonary hypertension: Patient had severe PAH by 7/23 RHC.  Echo in 2023 showed mildly dilated RV with mild RV systolic dysfunction, PASP was elevated at 86 mmHg.  V/Q scan did not suggest the presence of chronic PEs.  HIV negative.  No known liver disease.  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 Echo today showed EF 55-60%, D-shaped septum, moderate RV enlargement with moderate RV dysfunction, PASP 124 mmHg.  I think she is going to need aggressive treatment of her PH via pulmonary vasodilators.  Filling pressures were normal on RHC and cardiac output was preserved.  We planned to begin treatment with oral combination therapy.  However, she just found out that she is pregnant.  - She will not be able to start Opsumit while pregnant.  - She will start tadafil 20 mg daily and we will start working on getting her selexipag.   - We had a long talk today about her very high risk for mortality with uncontrolled pulmonary hypertension + RV dysfunction and pregnancy.  She actually may have had a miscarriage but we are not sure about this, she will see her OB/Gyn within the week.  If she elects to continue the pregnancy, risk of complication will be very high and she will need to be followed by an OB/Gyn who manages high risk pregnancies.  - If she has had a miscarriage or elects to terminate her pregnancy, we discussed again the need for effective contraception.  - She will eventually need a sleep study.  2. SLE: On belimumab.  Followed by Dr. Lenna Gilford.   Followup in 6 wks with echo.   Loralie Champagne 12/20/2021

## 2021-12-21 ENCOUNTER — Telehealth (HOSPITAL_COMMUNITY): Payer: Self-pay | Admitting: Pharmacist

## 2021-12-21 ENCOUNTER — Other Ambulatory Visit (HOSPITAL_COMMUNITY): Payer: Self-pay

## 2021-12-21 ENCOUNTER — Telehealth (HOSPITAL_COMMUNITY): Payer: Self-pay | Admitting: Pharmacy Technician

## 2021-12-21 NOTE — Telephone Encounter (Signed)
Patient Advocate Encounter   Received notification from [facility] that prior authorization for Jessica Frazier is required.   PA submitted on CoverMyMeds Key [id number] Status is pending   Will continue to follow.  Audry Riles, PharmD, BCPS, BCCP, CPP Heart Failure Clinic Pharmacist [phone]

## 2021-12-21 NOTE — Telephone Encounter (Signed)
Advanced Heart Failure Patient Advocate Jessica Frazier referral started. Will fax in once signatures are obtained. Patient is off of Opsumit due to pregnancy.

## 2021-12-23 ENCOUNTER — Other Ambulatory Visit (HOSPITAL_COMMUNITY): Payer: Self-pay

## 2021-12-23 NOTE — Telephone Encounter (Signed)
Advanced Heart Failure Patient Advocate Encounter  Sent in [place] referral and PA approval information. Document scanned to chart.

## 2021-12-24 NOTE — Telephone Encounter (Signed)
Advanced Heart Failure Patient Advocate Encounter  Prior Authorization for Jessica Frazier has been approved.    Effective dates: 12/21/21 through 12/20/22  Audry Riles, PharmD, BCPS, BCCP, CPP Heart Failure Clinic Pharmacist [phone]

## 2021-12-31 NOTE — Telephone Encounter (Addendum)
Received message from [facility] ([facility] is now out of network for patient):  This patient is now cleared for Uptravi. Nursing and pharmacy will be working with the patient to get the start of care set up.  Will add Uptravi to medication list.   Audry Riles, PharmD, BCPS, BCCP, CPP Heart Failure Clinic Pharmacist [phone]

## 2022-01-06 ENCOUNTER — Encounter (HOSPITAL_COMMUNITY): Payer: BLUE CROSS/BLUE SHIELD | Admitting: Cardiology

## 2022-01-06 ENCOUNTER — Other Ambulatory Visit (HOSPITAL_COMMUNITY): Payer: BLUE CROSS/BLUE SHIELD

## 2022-01-07 ENCOUNTER — Other Ambulatory Visit (HOSPITAL_COMMUNITY): Payer: Self-pay

## 2022-01-08 ENCOUNTER — Emergency Department (HOSPITAL_COMMUNITY): Admission: EM | Admit: 2022-01-08 | Payer: Self-pay

## 2022-01-08 ENCOUNTER — Emergency Department (HOSPITAL_COMMUNITY): Payer: BLUE CROSS/BLUE SHIELD

## 2022-01-08 ENCOUNTER — Encounter (HOSPITAL_COMMUNITY): Payer: Self-pay | Admitting: Emergency Medicine

## 2022-01-08 ENCOUNTER — Inpatient Hospital Stay (HOSPITAL_COMMUNITY)
Admission: EM | Admit: 2022-01-08 | Discharge: 2022-01-14 | DRG: 286 | Disposition: A | Discharge status: Home / Self Care | Payer: BLUE CROSS/BLUE SHIELD | Attending: Internal Medicine | Admitting: Internal Medicine

## 2022-01-08 ENCOUNTER — Other Ambulatory Visit (HOSPITAL_COMMUNITY): Payer: Self-pay

## 2022-01-08 ENCOUNTER — Encounter (HOSPITAL_COMMUNITY): Payer: BLUE CROSS/BLUE SHIELD | Admitting: Cardiology

## 2022-01-08 ENCOUNTER — Other Ambulatory Visit: Payer: Self-pay

## 2022-01-08 DIAGNOSIS — G510 Bell's palsy: Secondary | ICD-10-CM | POA: Diagnosis present

## 2022-01-08 DIAGNOSIS — J189 Pneumonia, unspecified organism: Secondary | ICD-10-CM | POA: Diagnosis not present

## 2022-01-08 DIAGNOSIS — R0602 Shortness of breath: Secondary | ICD-10-CM | POA: Diagnosis present

## 2022-01-08 DIAGNOSIS — K7460 Unspecified cirrhosis of liver: Secondary | ICD-10-CM | POA: Diagnosis present

## 2022-01-08 DIAGNOSIS — R011 Cardiac murmur, unspecified: Secondary | ICD-10-CM | POA: Diagnosis present

## 2022-01-08 DIAGNOSIS — I50812 Chronic right heart failure: Secondary | ICD-10-CM | POA: Diagnosis present

## 2022-01-08 DIAGNOSIS — D688 Other specified coagulation defects: Secondary | ICD-10-CM | POA: Diagnosis present

## 2022-01-08 DIAGNOSIS — M0600 Rheumatoid arthritis without rheumatoid factor, unspecified site: Secondary | ICD-10-CM | POA: Diagnosis present

## 2022-01-08 DIAGNOSIS — Z3202 Encounter for pregnancy test, result negative: Secondary | ICD-10-CM | POA: Diagnosis present

## 2022-01-08 DIAGNOSIS — Z8349 Family history of other endocrine, nutritional and metabolic diseases: Secondary | ICD-10-CM

## 2022-01-08 DIAGNOSIS — E063 Autoimmune thyroiditis: Secondary | ICD-10-CM | POA: Diagnosis present

## 2022-01-08 DIAGNOSIS — Z20822 Contact with and (suspected) exposure to covid-19: Secondary | ICD-10-CM | POA: Diagnosis present

## 2022-01-08 DIAGNOSIS — I272 Pulmonary hypertension, unspecified: Secondary | ICD-10-CM | POA: Insufficient documentation

## 2022-01-08 DIAGNOSIS — Z888 Allergy status to other drugs, medicaments and biological substances status: Secondary | ICD-10-CM

## 2022-01-08 DIAGNOSIS — R700 Elevated erythrocyte sedimentation rate: Secondary | ICD-10-CM | POA: Diagnosis present

## 2022-01-08 DIAGNOSIS — R7989 Other specified abnormal findings of blood chemistry: Secondary | ICD-10-CM

## 2022-01-08 DIAGNOSIS — E871 Hypo-osmolality and hyponatremia: Secondary | ICD-10-CM

## 2022-01-08 DIAGNOSIS — I517 Cardiomegaly: Secondary | ICD-10-CM | POA: Diagnosis present

## 2022-01-08 DIAGNOSIS — Z79899 Other long term (current) drug therapy: Secondary | ICD-10-CM

## 2022-01-08 DIAGNOSIS — R0600 Dyspnea, unspecified: Secondary | ICD-10-CM

## 2022-01-08 DIAGNOSIS — I2721 Secondary pulmonary arterial hypertension: Secondary | ICD-10-CM | POA: Diagnosis not present

## 2022-01-08 DIAGNOSIS — E039 Hypothyroidism, unspecified: Secondary | ICD-10-CM

## 2022-01-08 DIAGNOSIS — Z79890 Hormone replacement therapy (postmenopausal): Secondary | ICD-10-CM

## 2022-01-08 DIAGNOSIS — M329 Systemic lupus erythematosus, unspecified: Secondary | ICD-10-CM

## 2022-01-08 DIAGNOSIS — R197 Diarrhea, unspecified: Secondary | ICD-10-CM

## 2022-01-08 DIAGNOSIS — E872 Acidosis, unspecified: Secondary | ICD-10-CM | POA: Diagnosis present

## 2022-01-08 DIAGNOSIS — R6510 Systemic inflammatory response syndrome (SIRS) of non-infectious origin without acute organ dysfunction: Secondary | ICD-10-CM

## 2022-01-08 DIAGNOSIS — M069 Rheumatoid arthritis, unspecified: Secondary | ICD-10-CM | POA: Diagnosis present

## 2022-01-08 DIAGNOSIS — J90 Pleural effusion, not elsewhere classified: Secondary | ICD-10-CM | POA: Diagnosis present

## 2022-01-08 DIAGNOSIS — R079 Chest pain, unspecified: Secondary | ICD-10-CM | POA: Diagnosis not present

## 2022-01-08 DIAGNOSIS — Z3009 Encounter for other general counseling and advice on contraception: Secondary | ICD-10-CM

## 2022-01-08 DIAGNOSIS — Z833 Family history of diabetes mellitus: Secondary | ICD-10-CM

## 2022-01-08 DIAGNOSIS — J180 Bronchopneumonia, unspecified organism: Secondary | ICD-10-CM | POA: Diagnosis present

## 2022-01-08 DIAGNOSIS — K7689 Other specified diseases of liver: Secondary | ICD-10-CM | POA: Diagnosis present

## 2022-01-08 DIAGNOSIS — E876 Hypokalemia: Secondary | ICD-10-CM | POA: Diagnosis not present

## 2022-01-08 DIAGNOSIS — I5189 Other ill-defined heart diseases: Secondary | ICD-10-CM

## 2022-01-08 DIAGNOSIS — D649 Anemia, unspecified: Secondary | ICD-10-CM | POA: Diagnosis present

## 2022-01-08 DIAGNOSIS — I2781 Cor pulmonale (chronic): Secondary | ICD-10-CM | POA: Diagnosis present

## 2022-01-08 DIAGNOSIS — D849 Immunodeficiency, unspecified: Secondary | ICD-10-CM | POA: Diagnosis present

## 2022-01-08 DIAGNOSIS — Z87820 Personal history of traumatic brain injury: Secondary | ICD-10-CM

## 2022-01-08 DIAGNOSIS — Z30017 Encounter for initial prescription of implantable subdermal contraceptive: Secondary | ICD-10-CM

## 2022-01-08 LAB — RESPIRATORY PANEL BY PCR

## 2022-01-08 LAB — CBC WITH DIFFERENTIAL/PLATELET
Abs Immature Granulocytes: 0.04 10*3/uL (ref 0.00–0.07)
Basophils Absolute: 0 10*3/uL (ref 0.0–0.1)
Basophils Relative: 0 %
Eosinophils Absolute: 0.1 10*3/uL (ref 0.0–0.5)
Eosinophils Relative: 1 %
HCT: 35.9 % — ABNORMAL LOW (ref 36.0–46.0)
Hemoglobin: 11.4 g/dL — ABNORMAL LOW (ref 12.0–15.0)
Immature Granulocytes: 1 %
Lymphocytes Relative: 19 %
Lymphs Abs: 1.1 10*3/uL (ref 0.7–4.0)
MCH: 27.5 pg (ref 26.0–34.0)
MCHC: 31.8 g/dL (ref 30.0–36.0)
MCV: 86.5 fL (ref 80.0–100.0)
Monocytes Absolute: 0.5 10*3/uL (ref 0.1–1.0)
Monocytes Relative: 8 %
Neutro Abs: 4.3 10*3/uL (ref 1.7–7.7)
Neutrophils Relative %: 71 %
Platelets: 372 10*3/uL (ref 150–400)
RBC: 4.15 MIL/uL (ref 3.87–5.11)
RDW: 18.7 % — ABNORMAL HIGH (ref 11.5–15.5)
WBC: 6 10*3/uL (ref 4.0–10.5)
nRBC: 0 % (ref 0.0–0.2)

## 2022-01-08 LAB — HEPATIC FUNCTION PANEL
ALT: 29 U/L (ref 0–44)
AST: 67 U/L — ABNORMAL HIGH (ref 15–41)
Albumin: 2.1 g/dL — ABNORMAL LOW (ref 3.5–5.0)
Alkaline Phosphatase: 65 U/L (ref 38–126)
Bilirubin, Direct: 0.3 mg/dL — ABNORMAL HIGH (ref 0.0–0.2)
Indirect Bilirubin: 0.5 mg/dL (ref 0.3–0.9)
Total Bilirubin: 0.8 mg/dL (ref 0.3–1.2)
Total Protein: 7.8 g/dL (ref 6.5–8.1)

## 2022-01-08 LAB — TROPONIN I (HIGH SENSITIVITY)
Troponin I (High Sensitivity): 32 ng/L — ABNORMAL HIGH (ref <18)
Troponin I (High Sensitivity): 42 ng/L — ABNORMAL HIGH (ref <18)
Troponin I (High Sensitivity): 24 ng/L — ABNORMAL HIGH (ref <18)

## 2022-01-08 LAB — I-STAT BETA HCG BLOOD, ED (MC, WL, AP ONLY): I-stat hCG, quantitative: <5 m[IU]/mL (ref <5)

## 2022-01-08 LAB — URINALYSIS, ROUTINE W REFLEX MICROSCOPIC
Bilirubin Urine: NEGATIVE
Glucose, UA: NEGATIVE mg/dL
Hgb urine dipstick: NEGATIVE
Ketones, ur: NEGATIVE mg/dL
Leukocytes,Ua: NEGATIVE
Nitrite: NEGATIVE
Protein, ur: NEGATIVE mg/dL
Specific Gravity, Urine: 1.02 (ref 1.005–1.030)
pH: 6 (ref 5.0–8.0)

## 2022-01-08 LAB — MRSA NEXT GEN BY PCR, NASAL: MRSA by PCR Next Gen: NOT DETECTED

## 2022-01-08 LAB — TSH: TSH: 13.946 u[IU]/mL — ABNORMAL HIGH (ref 0.350–4.500)

## 2022-01-08 LAB — BASIC METABOLIC PANEL
Anion gap: 10 (ref 5–15)
BUN: 6 mg/dL (ref 6–20)
CO2: 20 mmol/L — ABNORMAL LOW (ref 22–32)
Calcium: 8 mg/dL — ABNORMAL LOW (ref 8.9–10.3)
Chloride: 102 mmol/L (ref 98–111)
Creatinine, Ser: 0.64 mg/dL (ref 0.44–1.00)
GFR, Estimated: >60 mL/min (ref >60)
Glucose, Bld: 86 mg/dL (ref 70–99)
Potassium: 3.7 mmol/L (ref 3.5–5.1)
Sodium: 132 mmol/L — ABNORMAL LOW (ref 135–145)

## 2022-01-08 LAB — SODIUM, URINE, RANDOM: Sodium, Ur: 23 mmol/L

## 2022-01-08 LAB — PROCALCITONIN: Procalcitonin: <0.1 ng/mL

## 2022-01-08 LAB — BRAIN NATRIURETIC PEPTIDE: B Natriuretic Peptide: 125.4 pg/mL — ABNORMAL HIGH (ref 0.0–100.0)

## 2022-01-08 LAB — HIV ANTIBODY (ROUTINE TESTING W REFLEX): HIV Screen 4th Generation wRfx: NONREACTIVE

## 2022-01-08 LAB — LACTIC ACID, PLASMA: Lactic Acid, Venous: 2.2 mmol/L (ref 0.5–1.9)

## 2022-01-08 LAB — RESP PANEL BY RT-PCR (FLU A&B, COVID) ARPGX2
Influenza A by PCR: NEGATIVE
Influenza B by PCR: NEGATIVE
SARS Coronavirus 2 by RT PCR: NEGATIVE

## 2022-01-08 MED ORDER — AZITHROMYCIN 500 MG PO TABS
500.0000 mg | ORAL_TABLET | Freq: Every day | ORAL | Status: AC
  Administered 2022-01-08 – 2022-01-12 (×5): 500 mg via ORAL
  Filled 2022-01-08 (×2): qty 1
  Filled 2022-01-08: qty 2
  Filled 2022-01-08 (×2): qty 1

## 2022-01-08 MED ORDER — MORPHINE SULFATE (PF) 2 MG/ML IV SOLN
2.0000 mg | Freq: Once | INTRAVENOUS | Status: AC
  Administered 2022-01-08: 2 mg via INTRAVENOUS
  Filled 2022-01-08: qty 1

## 2022-01-08 MED ORDER — ACETAMINOPHEN 650 MG RE SUPP: 650.0000 mg | Freq: Four times a day (QID) | RECTAL | Status: DC | PRN

## 2022-01-08 MED ORDER — MACITENTAN 10 MG PO TABS
10.0000 mg | ORAL_TABLET | Freq: Every day | ORAL | Status: DC
  Administered 2022-01-08: 10 mg via ORAL
  Filled 2022-01-08: qty 1

## 2022-01-08 MED ORDER — MACITENTAN 10 MG PO TABS: 10.0000 mg | ORAL_TABLET | Freq: Every day | ORAL | Status: DC

## 2022-01-08 MED ORDER — UPTRAVI TITRATION 200 & 800 MCG PO TBPK: 200.0000 ug | ORAL_TABLET | Freq: Two times a day (BID) | ORAL | 3 refills | Status: DC

## 2022-01-08 MED ORDER — TADALAFIL (PAH) 20 MG PO TABS: 20.0000 mg | ORAL_TABLET | Freq: Every day | ORAL | Status: DC

## 2022-01-08 MED ORDER — SODIUM CHLORIDE 0.9 % IV SOLN
2.0000 g | INTRAVENOUS | Status: AC
  Administered 2022-01-08 – 2022-01-12 (×5): 2 g via INTRAVENOUS
  Filled 2022-01-08 (×5): qty 20

## 2022-01-08 MED ORDER — IOHEXOL 350 MG/ML SOLN
75.0000 mL | Freq: Once | INTRAVENOUS | Status: AC | PRN
  Administered 2022-01-08: 75 mL via INTRAVENOUS

## 2022-01-08 MED ORDER — LEVOTHYROXINE SODIUM 75 MCG PO TABS
150.0000 ug | ORAL_TABLET | Freq: Every day | ORAL | Status: DC
  Administered 2022-01-09 – 2022-01-14 (×6): 150 ug via ORAL
  Filled 2022-01-08 (×6): qty 2

## 2022-01-08 MED ORDER — TADALAFIL 20 MG PO TABS
20.0000 mg | ORAL_TABLET | Freq: Every day | ORAL | Status: DC
  Administered 2022-01-08 – 2022-01-10 (×3): 20 mg via ORAL
  Filled 2022-01-08 (×3): qty 1

## 2022-01-08 MED ORDER — SODIUM CHLORIDE 0.9% FLUSH
3.0000 mL | Freq: Two times a day (BID) | INTRAVENOUS | Status: DC
  Administered 2022-01-08 – 2022-01-12 (×5): 3 mL via INTRAVENOUS

## 2022-01-08 MED ORDER — MORPHINE SULFATE (PF) 2 MG/ML IV SOLN
2.0000 mg | INTRAVENOUS | Status: AC | PRN
  Administered 2022-01-08 – 2022-01-09 (×3): 2 mg via INTRAVENOUS
  Filled 2022-01-08 (×3): qty 1

## 2022-01-08 MED ORDER — ALBUTEROL SULFATE (2.5 MG/3ML) 0.083% IN NEBU
2.5000 mg | INHALATION_SOLUTION | RESPIRATORY_TRACT | Status: DC | PRN
  Administered 2022-01-09: 2.5 mg via RESPIRATORY_TRACT
  Filled 2022-01-08: qty 3

## 2022-01-08 MED ORDER — ACETAMINOPHEN 325 MG PO TABS
650.0000 mg | ORAL_TABLET | Freq: Once | ORAL | Status: AC
  Administered 2022-01-08: 650 mg via ORAL
  Filled 2022-01-08: qty 2

## 2022-01-08 MED ORDER — ACETAMINOPHEN 325 MG PO TABS
650.0000 mg | ORAL_TABLET | Freq: Four times a day (QID) | ORAL | Status: DC | PRN
  Administered 2022-01-09 – 2022-01-10 (×2): 650 mg via ORAL
  Filled 2022-01-08 (×2): qty 2

## 2022-01-08 MED ORDER — ENOXAPARIN SODIUM 40 MG/0.4ML IJ SOSY
40.0000 mg | PREFILLED_SYRINGE | INTRAMUSCULAR | Status: DC
  Administered 2022-01-08 – 2022-01-12 (×5): 40 mg via SUBCUTANEOUS
  Filled 2022-01-08 (×7): qty 0.4

## 2022-01-08 NOTE — ED Provider Notes (Signed)
  Physical Exam  BP 98/60   Pulse (!) 115   Temp 100.3 F (37.9 C) (Oral)   Resp (!) 30   SpO2 96%   Physical Exam Vitals and nursing note reviewed.  Constitutional:      Appearance: She is well-developed.  HENT:     Head: Normocephalic and atraumatic.  Cardiovascular:     Rate and Rhythm: Tachycardia present.  Musculoskeletal:     Cervical back: Normal range of motion and neck supple.  Skin:    General: Skin is warm and dry.  Neurological:     Mental Status: She is alert.     Procedures  Procedures  ED Course / MDM    Medical Decision Making Risk OTC drugs. Prescription drug management. Decision regarding hospitalization.   Patient care assumed at shift change from [place]. PA, please see her note for full HPI.  Briefly, patient here status post 3 weeks after a miscarriage at [redacted] weeks gestation.  She does have an ongoing history of pulmonary hypertension, SLE soft pressures along with febrile at 99.1, later developed a fever of 100.3, given Tylenol.  Troponin is slightly elevated, prior colleague has spoken to cardiology who will see patient while on admission. Chest xray questionable for infiltrate, but patient without a cough.Plan is for admission via hospitalist.   7:17 AM Spoke to Dr. Tamala Julian who will admit patient for further management.        Janeece Fitting, PA-C 56/21/30 [id number]    Delora Fuel, MD 84/69/62 [id number]

## 2022-01-08 NOTE — Consult Note (Signed)
Advanced Heart Failure Team Consult Note   Primary Physician: [name], NP PCP-Cardiologist:  Dr. Aundra Dubin   Reason for Consultation: Management of [name] in the Setting PNA  HPI:    Jessica Frazier is seen today for evaluation of management of PAH in the setting of PNA, at the request of Dr. Tamala Julian, Internal Medicine.   21 y.o. with history of SLE and seronegative rheumatoid arthritis was found to have pulmonary hypertension.  SLE is followed by Dr. Lenna Gilford.  She is on belimumab.  Earlier this year, she developed exertional dyspnea.  Her primary care physician ordered an echo, which showed mildly dilated RV with mild systolic dysfunction and PASP 86.  She was referred to pulmonary for evaluation.  PFTs showed moderate restriction with decreased DLCO. V/Q scan showed no evidence for chronic PE.  RHC in 7/23 showed severe pulmonary arterial hypertension.   Recent Echo 10/6 EF 55-60%, D-shaped septum, moderate RV enlargement with moderate RV dysfunction, PASP 124 mmHg.    Referred to the [facility], recently established care w/ Dr Aundra Dubin. We planned to begin treatment with oral combination therapy. However pt had recently just found out that she was pregnant. Unable to start Opsumit. Started on tadalafil 20 mg daily and had been approved to start Uptravi, though patient did not start.   Unfortunately, pt had miscarriage 3-4 wks ago at [redacted]wks gestation. This has been confirmed by OB/GYN.   Post miscarriage, pt started Opsumit. Has been on this for 2 wks now. On Sildenafil. Overall reports improvement in functional status, NYHA Class IIIb-II.   She presented to ED overnight w/ complaints of pleuritic chest pain, SOB and productive cough. Tachycardic HR 110s, initial BPs soft 32I systlic. O2 sats 98% on RA. Febrile w/ mTemp 100.3. WBC 6.0K. Chest CT negative for PE, + findings of PAH. +Mild left basilar infiltrate noted.  PCT <0.10. BNP 125. Hs trop 32>>42. Respiratory panel  pending. ? hCG < 5.0.   Started on abx, azithromycin + ceftriaxone. AHF team asked to assist w/ management of Jessica Frazier.   O2 sats currently stable, 98% on RA. SBPs low 100s.    Cardiac Studies   RHC 10/08/21 1. Severe pulmonary arterial hypertension, PVR 11.6 WU.  2. Normal right and left heart filling pressures.  3. Preserved cardiac output.     2D Echo 12/18/21   1. Left ventricular ejection fraction, by estimation, is 55 to 60%. The  left ventricle has normal function. The left ventricle has no regional  wall motion abnormalities. Left ventricular diastolic parameters are  consistent with Grade I diastolic  dysfunction (impaired relaxation).   2. D-shaped septum suggests RV pressure/volume overload. Right  ventricular systolic function is moderately reduced. The right ventricular  size is moderately enlarged. There is severely elevated pulmonary artery  systolic pressure. The estimated right  ventricular systolic pressure is 712.4 mmHg.   3. Right atrial size was mildly dilated.   4. The mitral valve is normal in structure. No evidence of mitral valve  regurgitation. No evidence of mitral stenosis.   5. The aortic valve is tricuspid. Aortic valve regurgitation is not  visualized. No aortic stenosis is present.   6. The pulmonic valve was abnormal.   7. The inferior vena cava is normal in size with greater than 50%  respiratory variability, suggesting right atrial pressure of 3 mmHg.    Review of Systems: [y] = yes, '[ ]'$  = no   General: Weight gain '[ ]'$ ; Weight loss '[ ]'$ ;  Anorexia '[ ]'$ ; Fatigue '[ ]'$ ; Fever '[ ]'$ ; Chills '[ ]'$ ; Weakness '[ ]'$   Cardiac: Chest pain/pressure [Y ]; Resting SOB '[ ]'$ ; Exertional SOB [ Y]; Orthopnea '[ ]'$ ; Pedal Edema '[ ]'$ ; Palpitations '[ ]'$ ; Syncope '[ ]'$ ; Presyncope '[ ]'$ ; Paroxysmal nocturnal dyspnea'[ ]'$   Pulmonary: Cough [ Y]; Wheezing'[ ]'$ ; Hemoptysis'[ ]'$ ; Sputum '[ ]'$ ; Snoring '[ ]'$   GI: Vomiting'[ ]'$ ; Dysphagia'[ ]'$ ; Melena'[ ]'$ ; Hematochezia '[ ]'$ ; Heartburn'[ ]'$ ; Abdominal pain '[ ]'$ ;  Constipation '[ ]'$ ; Diarrhea '[ ]'$ ; BRBPR '[ ]'$   GU: Hematuria'[ ]'$ ; Dysuria '[ ]'$ ; Nocturia'[ ]'$   Vascular: Pain in legs with walking '[ ]'$ ; Pain in feet with lying flat '[ ]'$ ; Non-healing sores '[ ]'$ ; Stroke '[ ]'$ ; TIA '[ ]'$ ; Slurred speech '[ ]'$ ;  Neuro: Headaches'[ ]'$ ; Vertigo'[ ]'$ ; Seizures'[ ]'$ ; Paresthesias'[ ]'$ ;Blurred vision '[ ]'$ ; Diplopia '[ ]'$ ; Vision changes '[ ]'$   Ortho/Skin: Arthritis '[ ]'$ ; Joint pain '[ ]'$ ; Muscle pain '[ ]'$ ; Joint swelling '[ ]'$ ; Back Pain '[ ]'$ ; Rash '[ ]'$   Psych: Depression'[ ]'$ ; Anxiety'[ ]'$   Heme: Bleeding problems '[ ]'$ ; Clotting disorders '[ ]'$ ; Anemia '[ ]'$   Endocrine: Diabetes '[ ]'$ ; Thyroid dysfunction'[ ]'$   Home Medications Prior to Admission medications   Medication Sig Start Date End Date Taking? Authorizing Provider  b complex vitamins capsule Take 1 capsule by mouth daily.   Yes [provider]  cholecalciferol (VITAMIN D3) 25 MCG (1000 UNIT) tablet Take 1,000 Units by mouth daily.   Yes [provider]  levothyroxine (SYNTHROID) 150 MCG tablet Take 150 mcg by mouth daily before breakfast. 07/22/21  Yes [provider]  tadalafil, PAH, (ADCIRCA) 20 MG tablet Take 1 tablet (20 mg total) by mouth daily. 12/18/21  Yes Larey Dresser, MD  Belimumab (BENLYSTA) 200 MG/ML SOAJ Inject 200 mg into the skin every Sunday. Patient not taking: Reported on 12/18/2021 06/15/21   [provider]  Selexipag (UPTRAVI) 200 & 800 MCG TBPK Take 200 mcg by mouth in the morning and at bedtime. Uptitrate every 2 weeks to maximum tolerated dose or 1600 mcg BID Patient not taking: Reported on 01/08/2022    [provider]    Past Medical History: Past Medical History:  Diagnosis Date   Anemia    Phreesia 07/22/2020   Arthritis    Phreesia 07/22/2020   Bell's palsy    Hypothyroidism    Rheumatoid arthritis ([place])    Thyroid disease    Phreesia 07/22/2020    Past Surgical History: Past Surgical History:  Procedure Laterality Date   RIGHT HEART CATH N/A 10/08/2021   Procedure:  RIGHT HEART CATH;  Surgeon: Larey Dresser, MD;  Location: [facility];  Service: Cardiovascular;  Laterality: N/A;    Family History: Family History  Problem Relation Age of Onset   Graves' disease Mother    Healthy Father    Diabetes Maternal Grandfather     Social History: Social History   Socioeconomic History   Marital status: Single    Spouse name: Not on file   Number of children: Not on file   Years of education: Not on file   Highest education level: Not on file  Occupational History   Not on file  Tobacco Use   Smoking status: Never   Smokeless tobacco: Never  Vaping Use   Vaping Use: Never used  Substance and Sexual Activity   Alcohol use: Not Currently   Drug use: Never   Sexual activity: Not on file  Other  Topics Concern   Not on file  Social History Narrative   Not on file   Social Determinants of Health   Financial Resource Strain: Not on file  Food Insecurity: Not on file  Transportation Needs: Not on file  Physical Activity: Not on file  Stress: Not on file  Social Connections: Not on file    Allergies:  Allergies  Allergen Reactions   Hydroxychloroquine     Other reaction(s): dyspnea   Other Diarrhea and Other (See Comments)    Omega XL   Rinvoq [Upadacitinib] Other (See Comments)    Enlarged the patient's liver- had to come to the ED, 2022    Objective:    Vital Signs:   Temp:  [98.3 F (36.8 C)-100.3 F (37.9 C)] 98.3 F (36.8 C) (10/27 0806) Pulse Rate:  [109-125] 109 (10/27 0745) Resp:  [16-40] 30 (10/27 0745) BP: (90-113)/(60-78) 103/64 (10/27 0745) SpO2:  [94 %-99 %] 97 % (10/27 0745)    Weight change: There were no vitals filed for this visit.  Intake/Output:   Intake/Output Summary (Last 24 hours) at 01/08/2022 1010 Last data filed at 01/08/2022 0934 Gross per 24 hour  Intake 51.34 ml  Output --  Net 51.34 ml      Physical Exam    General:  Well appearing, thin young female. No resp  difficulty HEENT: normal Neck: supple. JVP 10-11 cm . Carotids 2+ bilat; no bruits. No lymphadenopathy or thyromegaly appreciated. Cor: PMI nondisplaced. Regular rate & rhythm. No rubs, gallops or murmurs. Lungs: clear Abdomen: soft, nontender, nondistended. No hepatosplenomegaly. No bruits or masses. Good bowel sounds. Extremities: no cyanosis, clubbing, rash, edema Neuro: alert & orientedx3, cranial nerves grossly intact. moves all 4 extremities w/o difficulty. Affect pleasant   Telemetry   NSR 90s   EKG    Sinus tach 123 bpm, RAD, RVH   Labs   Basic Metabolic Panel: Recent Labs  Lab 01/08/22 0225  NA 132*  K 3.7  CL 102  CO2 20*  GLUCOSE 86  BUN 6  CREATININE 0.64  CALCIUM 8.0*    Liver Function Tests: No results for input(s): "AST", "ALT", "ALKPHOS", "BILITOT", "PROT", "ALBUMIN" in the last 168 hours. No results for input(s): "LIPASE", "AMYLASE" in the last 168 hours. No results for input(s): "AMMONIA" in the last 168 hours.  CBC: Recent Labs  Lab 01/08/22 0225  WBC 6.0  NEUTROABS 4.3  HGB 11.4*  HCT 35.9*  MCV 86.5  PLT 372    Cardiac Enzymes: No results for input(s): "CKTOTAL", "CKMB", "CKMBINDEX", "TROPONINI" in the last 168 hours.  BNP: BNP (last 3 results) Recent Labs    11/18/21 1534 01/08/22 0225  BNP 100.5* 125.4*    ProBNP (last 3 results) No results for input(s): "PROBNP" in the last 8760 hours.   CBG: No results for input(s): "GLUCAP" in the last 168 hours.  Coagulation Studies: No results for input(s): "LABPROT", "INR" in the last 72 hours.   Imaging   CT Angio Chest PE W and/or Wo Contrast  Result Date: 01/08/2022 CLINICAL DATA:  21 year old female with lupus and pulmonary artery hypertension. Chest pain since yesterday on the left. Shortness of breath. EXAM: CT ANGIOGRAPHY CHEST WITH CONTRAST TECHNIQUE: Multidetector CT imaging of the chest was performed using the standard protocol during bolus administration of  intravenous contrast. Multiplanar CT image reconstructions and MIPs were obtained to evaluate the vascular anatomy. RADIATION DOSE REDUCTION: This exam was performed according to the departmental dose-optimization program which includes automated exposure control,  adjustment of the mA and/or kV according to patient size and/or use of iterative reconstruction technique. CONTRAST:  23m OMNIPAQUE IOHEXOL 350 MG/ML SOLN COMPARISON:  Chest radiographs 0232 hours today and earlier. Chest CTA 06/02/2020. FINDINGS: Cardiovascular: Excellent contrast bolus timing in the pulmonary arterial tree. Some progression of central pulmonary artery enlargement compared to the CTA last year, main PA now measures up to 34 mm diameter at the level of the ascending aorta, versus 27 mm last year. Likewise, enlarged left and right main pulmonary arteries and arteries at the hilum which taper. But no pulmonary artery filling defect, evidence of thrombus. Cardiac size remains within normal limits. Lower lung volumes. No definite pleural effusion. Negative visible aorta. No calcified coronary artery atherosclerosis is evident. Mediastinum/Nodes: No convincing mediastinal mass or lymphadenopathy. Lungs/Pleura: Lower lung volumes. Major airways remain patent. Trace or small bilateral layering pleural effusions are new. Additional mild widespread increased pulmonary interstitial opacity and patchy nonspecific peribronchial opacity which is greater in the left lung, especially the left lower lobe, and might be atelectasis. Upper Abdomen: A Patak steatosis appears resolved since last year. Negative visible liver, spleen, stomach, pancreas. Musculoskeletal: Negative. Review of the MIP images confirms the above findings. IMPRESSION: 1. Negative for acute pulmonary embolus. 2. Positive for central pulmonary artery enlargement since a CTA last year, strongly suggesting Pulmonary Artery Hypertension. 3. New small/trace layering pleural effusions and  lower lung volumes with nonspecific increased interstitial and peribronchial opacity. Differential considerations include atelectasis, interstitial edema, developing bilateral bronchopneumonia. Electronically Signed   By: HGenevie AnnM.D.   On: 01/08/2022 05:58   DG Chest 1 View  Result Date: 01/08/2022 CLINICAL DATA:  Chest pain for 2 days with shortness of breath EXAM: CHEST  1 VIEW COMPARISON:  08/04/2021 FINDINGS: Cardiac shadow is stable. The lungs are well aerated bilaterally with the exception of left basilar infiltrate. Small effusion is present as well. No bony abnormality is noted. IMPRESSION: Mild left basilar infiltrate. Electronically Signed   By: MInez CatalinaM.D.   On: 01/08/2022 02:48     Medications:     Current Medications:  azithromycin  500 mg Oral Daily   enoxaparin (LOVENOX) injection  40 mg Subcutaneous Q24H   [START ON 01/09/2022] levothyroxine  150 mcg Oral QAC breakfast   macitentan  10 mg Oral Daily   sodium chloride flush  3 mL Intravenous Q12H   tadalafil (PAH)  20 mg Oral Daily    Infusions:  cefTRIAXone (ROCEPHIN)  IV Stopped (01/08/22 02376      Patient Profile   21y/o female history of SLE, seronegative rheumatoid arthritis and hypothyroidism recently found to have pulmonary arterial hypertension, PVR 11.6 WU on RHC, recent pregnancy s/p miscarriage at [redacted] wks gestation, admitted for CAP.    Assessment/Plan   1. CAP - management per IM - azithromycin + ceftriaxone - COVID negative - Respiratory panel pending   2. PAH w/ RV Failure  - severe PAH by 7/23 RHC, PVR 11.6 WU - echo 10/23 EF 55-60%, D-shaped septum, mod RV enlargement w/ mod RV dysfunction, RVSP 124 - V/Q scan negative, HIV negative, no known liver disease. 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 Reports improvement in functional status, NYHA Class IIIb>>II - O2 sats table on RA, 98%  - continue tadalafil 20 mg  daily  - restart Optsumit 10 mg daily  - discussed importance of avoiding future pregnancies and needs  for reliable birth control   3. SLE: On belimumab.  Followed by Dr. Lenna Gilford.  4. Recent Pregnancy  - s/p recent miscarriage at [redacted] wks gestation, confirmed by OB/GYN  - ? hCG < 5.0.  - needs to avoid future pregnancies in setting of severe PAH and Optsumit use  - stressed need for effective contraception   5. Hypothyroidism - on Levothyroxine    Length of Stay: 0  Lyda Jester, PA-C  01/08/2022, 10:10 AM  Advanced Heart Failure Team Pager [phone] (M-F; [place])  Please contact [facility] for night-coverage after hours (4p -7a ) and weekends on [email or web address]

## 2022-01-08 NOTE — ED Triage Notes (Signed)
Patient here with chest pain and shortness of breath.  She states that it started over 24 hrs ago and has gotten worse.  Patient had a miscarriage about 3-4 weeks ago. Patient with short, shallow breaths, increased work of breathing.

## 2022-01-08 NOTE — H&P (Signed)
History and Physical    Patient: Jessica Frazier OEU:[id number] DOB: [birth year] DOA: 01/08/2022 DOS: the patient was seen and examined on 01/08/2022 PCP: [name], NP  Patient coming from: Home  Chief Complaint:  Chief Complaint  Patient presents with   Chest Pain   Shortness of Breath   HPI: Jessica Frazier is a 21 y.o. female with medical history significant of SLE, seronegative rheumatoid arthritis, pulmonary hypertension, traumatic intracranial hemorrhage, hypothyroidism, and recent miscarriage 3 weeks ago presents with complaints of chest pain and shortness of breath.  Followed by Dr. Lenna Gilford for her lupus.  Symptoms started 2 days ago with left-sided chest pain that was sharp and constant in nature.  Associated symptoms included a dry cough, insomnia, and reports of diarrhea.  She has been having liquid stools over the last 3 to 4 days and relates it to her medications as it was listed as a side effect.  Her levothyroxine dose had been increased 150 mcg after labs from last month showed TSH elevated at 6.408.  She admits that she has been taking levothyroxine with her tadalafil and Opsumit.  She had been awaiting insurance approval to start [place], but states the medications are too began in November.  Denies having any recent sick contacts, nausea, vomiting, fever, leg swelling, dysuria, or blood in stools.  She also notes that she chronically has weakness for which she needs some assistance with sitting up for which family helps her, but is normally able to ambulate without any.  Upon admission into the emergency department patient was noted to have a temperature of 100.3 F with tachycardia and tachypnea.  Blood pressures were noted to be soft and O2 saturations otherwise maintained.  Labs revealed hemoglobin 11.4, sodium 132, BNP 125.4, and high-sensitivity troponin 32->42. Patient has been given acetaminophen 650 mg p.o. and  morphine 2 mg IV.  Review of Systems: As mentioned in the history of present illness. All other systems reviewed and are negative. Past Medical History:  Diagnosis Date   Anemia    Phreesia 07/22/2020   Arthritis    Phreesia 07/22/2020   Bell's palsy    Hypothyroidism    Rheumatoid arthritis ([place])    Thyroid disease    Phreesia 07/22/2020   Past Surgical History:  Procedure Laterality Date   RIGHT HEART CATH N/A 10/08/2021   Procedure: RIGHT HEART CATH;  Surgeon: Larey Dresser, MD;  Location: [facility];  Service: Cardiovascular;  Laterality: N/A;   Social History:  reports that she has never smoked. She has never used smokeless tobacco. She reports that she does not currently use alcohol. She reports that she does not use drugs.  Allergies  Allergen Reactions   Hydroxychloroquine     Other reaction(s): dyspnea   Other Diarrhea and Other (See Comments)    Omega XL   Rinvoq [Upadacitinib] Other (See Comments)    Enlarged the patient's liver- had to come to the ED, 2022    Family History  Problem Relation Age of Onset   Graves' disease Mother    Healthy Father    Diabetes Maternal Grandfather     Prior to Admission medications   Medication Sig Start Date End Date Taking? Authorizing Provider  b complex vitamins capsule Take 1 capsule by mouth daily.   Yes [provider]  cholecalciferol (VITAMIN D3) 25 MCG (1000 UNIT) tablet Take 1,000 Units by mouth daily.   Yes [provider]  levothyroxine (SYNTHROID) 150  MCG tablet Take 150 mcg by mouth daily before breakfast. 07/22/21  Yes [provider]  tadalafil, PAH, (ADCIRCA) 20 MG tablet Take 1 tablet (20 mg total) by mouth daily. 12/18/21  Yes Larey Dresser, MD  Belimumab (BENLYSTA) 200 MG/ML SOAJ Inject 200 mg into the skin every Sunday. Patient not taking: Reported on 12/18/2021 06/15/21   [provider]  Selexipag (UPTRAVI) 200 & 800 MCG TBPK Take 200 mcg by mouth in the  morning and at bedtime. Uptitrate every 2 weeks to maximum tolerated dose or 1600 mcg BID Patient not taking: Reported on 01/08/2022    [provider]    Physical Exam: Vitals:   01/08/22 0430 01/08/22 0445 01/08/22 0500 01/08/22 0515  BP: 103/66 1'07/66 99/63 98/60 '$  Pulse: (!) 118 (!) 113 (!) 114 (!) 115  Resp: (!) 37 (!) 33 (!) 29 (!) 30  Temp:      TempSrc:      SpO2: 95% 95% 97% 96%   Exam  Constitutional: Young female who appears to be in no acute distress Eyes: PERRL, lids and conjunctivae normal ENMT: Mucous membranes are moist.  Normal dentition.  Neck: normal, supple,   Respiratory: clear to auscultation bilaterally, no wheezing, no crackles. Normal respiratory effort. No accessory muscle use.  Cardiovascular: Tachycardic no significant lower extremity edema appreciated at this time. Abdomen: no tenderness, no masses palpated.  Bowel sounds positive.  Musculoskeletal: no clubbing / cyanosis. No joint deformity upper and lower extremities.  Skin: no rashes, lesions, ulcers. No induration Neurologic: CN 2-12 grossly intact.  Patient needed some assistance with sitting up but otherwise able to move all extremities. Psychiatric: Normal judgment and insight. Alert and oriented x 3. Normal mood.   Data Reviewed:   EKG reveals sinus tachycardia at 125 bpm with right axis deviation  Assessment and Plan: Chest pain elevated troponin Patient presented with left-sided chest pain which was sharp and constant.  High-sensitivity troponin 32.  EKG without significant ischemic changes.  Cardiology was consulted.  Suspect possibly to demand. -Admit to a telemetry bed -Continue to trend cardiac troponins -Cardiology consulted,  will follow-up for any further recommendations  SIRS possible community-acquired pneumonia Patient reports that she has had a dry cough.  Presented with tachycardia, tachypnea, and low-grade temp of 100.3 F meeting SIRS criteria.  Chest x-ray noting  a left lower lobe opacity with CTA of the chest noting no signs of a pulmonary embolism.  CTA chest did note small bilateral pleural effusions and patchy infiltrates with differential including possibility of interstitial edema, atelectasis, or possible developing bilateral bronchopneumonia.   -Check blood cultures -Check urinalysis -Check lactic acid and procalcitonin -Check influenza, COVID-19, and complete respiratory virus panel -Incentive spirometry and flutter valve -Start empiric antibiotics with Rocephin and azithromycin.  De-escalate when  Dyspnea Cute on chronic.  Patient has been evaluated by Dr. Verlee Monte of pulmonology in the outpatient setting and underwent pulmonary function testing 09/2021 which noted restriction may be present with moderately reduced diffusing capacity, but was limited by patient effort.  Suspect symptoms likely related to patient's severe pulmonary hypertension.  Diastolic dysfunction severe pulmonary hypertension She does not appear grossly fluid overloaded on physical exam.  She was noted to have elevated BNP of 125.4.  Patient just recently had echocardiogram on 10/6 noted to have an EF of 55 to 60% with grade 1 diastolic dysfunction with D-shaped septum to which suggest severe pulmonary artery hypertension.  Right heart cath from 7/27 noted PVR of 11.6 WU.  Thought related to SLE.  Followed by Dr. Cardiology.  Patient has been taking Opsumit and tadalafil.  Romie Minus are are supposed to be started in November. -Monitor intake and output -Continue Opsumit and tadalafil -Follow-up for any further recommendations from cardiology  Hyponatremia Acute.  Sodium 132.  Unclear cause of symptoms at this time.  She has been having diarrhea which could be a possible cause. -Check urine sodium -Continue to monitor  SLE and rheumatoid arthritis Patient followed by Dr. Lenna Gilford in outpatient setting of rheumatology.  Normocytic anemia Chronic.  Hemoglobin 11.4  g/dL which appears around patient's baseline.  Patient denies any reports of bleeding. -Continue to monitor  Hypothyroidism Last TSH was 6.408 on 11/18/2021.  Follow that appointment levothyroxine has been increased to 150 mcg daily.  Patient admits taking medication with other medications in the morning. -Check TSH  -Advised patient to take levothyroxine at least 2 to 3 hours prior to taking any other medications to avoid decreased absorption. -Continue levothyroxine at current dose -Recommend outpatient follow-up to recheck levels in 4 to 6 weeks  Diarrhea Acute.  Patient reports having liquid stools over the last couple of days.  Thought possibly related to medications. -Monitor intake and output  DVT prophylaxis: Lovenox Advance Care Planning:   Code Status: Full Code    Consults: Cardiology  Family Communication: Mother updated at bedside  Severity of Illness: The appropriate patient status for this patient is OBSERVATION. Observation status is judged to be reasonable and necessary in order to provide the required intensity of service to ensure the patient's safety. The patient's presenting symptoms, physical exam findings, and initial radiographic and laboratory data in the context of their medical condition is felt to place them at decreased risk for further clinical deterioration. Furthermore, it is anticipated that the patient will be medically stable for discharge from the hospital within 2 midnights of admission.   Author: Norval Morton, MD 01/08/2022 7:12 AM  For on call review [email or web address].

## 2022-01-08 NOTE — ED Notes (Signed)
Patient transported to CT 

## 2022-01-08 NOTE — ED Notes (Signed)
ED TO INPATIENT HANDOFF REPORT  ED Nurse Name and Phone #: [phone] Name/Age/Gender Jessica Frazier 21 y.o. female Room/Bed: 020C/020C  Code Status   Code Status: Full Code  Home/SNF/Other Home Patient oriented to: self, place, time, and situation Is this baseline? Yes   Triage Complete: Triage complete  Chief Complaint Chest pain [R07.9]  Triage Note Patient here with chest pain and shortness of breath.  She states that it started over 24 hrs ago and has gotten worse.  Patient had a miscarriage about 3-4 weeks ago. Patient with short, shallow breaths, increased work of breathing.     Allergies Allergies  Allergen Reactions   Hydroxychloroquine     Other reaction(s): dyspnea   Other Diarrhea and Other (See Comments)    Omega XL   Rinvoq [Upadacitinib] Other (See Comments)    Enlarged the patient's liver- had to come to the ED, 2022    Level of Care/Admitting Diagnosis ED Disposition     ED Disposition  Admit   Condition  --   [place]: [facility] [[id number]]  Level of Care: Telemetry Medical [104]  May place patient in observation at [facility] or [facility] if equivalent level of care is available:: No  Covid Evaluation: Asymptomatic - no recent exposure (last 10 days) testing not required  Diagnosis: Chest pain [628366]  Admitting Physician: Norval Morton [[id number]]  Attending Physician: Norval Morton [[id number]]          B Medical/Surgery History Past Medical History:  Diagnosis Date   Anemia    Phreesia 07/22/2020   Arthritis    Phreesia 07/22/2020   Bell's palsy    Hypothyroidism    Rheumatoid arthritis (August)    Thyroid disease    Phreesia 07/22/2020   Past Surgical History:  Procedure Laterality Date   RIGHT HEART CATH N/A 10/08/2021   Procedure: RIGHT HEART CATH;  Surgeon: Larey Dresser, MD;  Location: [facility];  Service: Cardiovascular;  Laterality: N/A;     A IV  Location/Drains/Wounds Patient Lines/Drains/Airways Status     Active Line/Drains/Airways     Name Placement date Placement time Site Days   Peripheral IV 01/08/22 20 G Left Antecubital 01/08/22  0342  Antecubital  less than 1   Pressure Injury 06/16/20 Coccyx Medial Stage 2 -  Partial thickness loss of dermis presenting as a shallow open injury with a red, pink wound bed without slough. small area with broken skin 06/16/20  1530  -- 571   Wound / Incision (Open or Dehisced) 06/03/20 Head Posterior 06/03/20  1500  Head  584            Intake/Output Last 24 hours  Intake/Output Summary (Last 24 hours) at 01/08/2022 1618 Last data filed at 01/08/2022 5681 Gross per 24 hour  Intake 51.34 ml  Output --  Net 51.34 ml    Labs/Imaging Results for orders placed or performed during the hospital encounter of 01/08/22 (from the past 48 hour(s))  Brain natriuretic peptide     Status: Abnormal   Collection Time: 01/08/22  2:25 AM  Result Value Ref Range   B Natriuretic Peptide 125.4 (H) 0.0 - 100.0 pg/mL    Comment: Performed at [facility], [address], [place], [place] [address]  CBC with Differential     Status: Abnormal   Collection Time: 01/08/22  2:25 AM  Result Value Ref Range   WBC 6.0 4.0 -  10.5 K/uL   RBC 4.15 3.87 - 5.11 MIL/uL   Hemoglobin 11.4 (L) 12.0 - 15.0 g/dL   HCT 35.9 (L) 36.0 - 46.0 %   MCV 86.5 80.0 - 100.0 fL   MCH 27.5 26.0 - 34.0 pg   MCHC 31.8 30.0 - 36.0 g/dL   RDW 18.7 (H) 11.5 - 15.5 %   Platelets 372 150 - 400 K/uL   nRBC 0.0 0.0 - 0.2 %   Neutrophils Relative % 71 %   Neutro Abs 4.3 1.7 - 7.7 K/uL   Lymphocytes Relative 19 %   Lymphs Abs 1.1 0.7 - 4.0 K/uL   Monocytes Relative 8 %   Monocytes Absolute 0.5 0.1 - 1.0 K/uL   Eosinophils Relative 1 %   Eosinophils Absolute 0.1 0.0 - 0.5 K/uL   Basophils Relative 0 %   Basophils Absolute 0.0 0.0 - 0.1 K/uL   Immature Granulocytes 1 %   Abs Immature Granulocytes 0.04 0.00 - 0.07 K/uL     Comment: Performed at [facility] [address] [place], [place] [address]  Basic metabolic panel     Status: Abnormal   Collection Time: 01/08/22  2:25 AM  Result Value Ref Range   Sodium 132 (L) 135 - 145 mmol/L   Potassium 3.7 3.5 - 5.1 mmol/L   Chloride 102 98 - 111 mmol/L   CO2 20 (L) 22 - 32 mmol/L   Glucose, Bld 86 70 - 99 mg/dL    Comment: Glucose reference range applies only to samples taken after fasting for at least 8 hours.   BUN 6 6 - 20 mg/dL   Creatinine, Ser 0.64 0.44 - 1.00 mg/dL   Calcium 8.0 (L) 8.9 - 10.3 mg/dL   GFR, Estimated >60 >60 mL/min    Comment: (NOTE) Calculated using the CKD-EPI Creatinine Equation (2021)    Anion gap 10 5 - 15    Comment: Performed at [facility] [address] [place], [place] [address]  Troponin I (High Sensitivity)     Status: Abnormal   Collection Time: 01/08/22  2:25 AM  Result Value Ref Range   Troponin I (High Sensitivity) 32 (H) <18 ng/L    Comment: (NOTE) Elevated high sensitivity troponin I (hsTnI) values and significant  changes across serial measurements may suggest ACS but many other  chronic and acute conditions are known to elevate hsTnI results.  Refer to the "Links" section for chest pain algorithms and additional  guidance. Performed at [facility], [place] [address], [place], [place] [address]   I-Stat Beta hCG blood, ED ([facility], [facility], [facility] only)     Status: None   Collection Time: 01/08/22  2:33 AM  Result Value Ref Range   I-stat hCG, quantitative <5.0 <5 mIU/mL   Comment 3            Comment:   GEST. AGE      CONC.  (mIU/mL)   <=1 WEEK        5 - 50     2 WEEKS       50 - 500     3 WEEKS       100 - 10,000     4 WEEKS     1,000 - 30,000        FEMALE AND NON-PREGNANT FEMALE:     LESS THAN 5 mIU/mL   Resp Panel by RT-PCR (Flu A&B, Covid) Anterior Nasal Swab     Status: None  Collection Time: 01/08/22  7:45 AM   Specimen: Anterior Nasal Swab  Result Value Ref Range   SARS Coronavirus 2  by RT PCR NEGATIVE NEGATIVE    Comment: (NOTE) SARS-CoV-2 target nucleic acids are NOT DETECTED.  The SARS-CoV-2 RNA is generally detectable in upper respiratory specimens during the acute phase of infection. The lowest concentration of SARS-CoV-2 viral copies this assay can detect is 138 copies/mL. A negative result does not preclude SARS-Cov-2 infection and should not be used as the sole basis for treatment or other patient management decisions. A negative result may occur with  improper specimen collection/handling, submission of specimen other than nasopharyngeal swab, presence of viral mutation(s) within the areas targeted by this assay, and inadequate number of viral copies(<138 copies/mL). A negative result must be combined with clinical observations, patient history, and epidemiological information. The expected result is Negative.  Fact Sheet for Patients:  EntrepreneurPulse.com.au  Fact Sheet for Healthcare Providers:  IncredibleEmployment.be  This test is no t yet approved or cleared by the [place] FDA and  has been authorized for detection and/or diagnosis of SARS-CoV-2 by FDA under an Emergency Use Authorization (EUA). This EUA will remain  in effect (meaning this test can be used) for the duration of the COVID-19 declaration under Section 564(b)(1) of the Act, 21 U.S.C.section 360bbb-3(b)(1), unless the authorization is terminated  or revoked sooner.       Influenza A by PCR NEGATIVE NEGATIVE   Influenza B by PCR NEGATIVE NEGATIVE    Comment: (NOTE) The Xpert Xpress SARS-CoV-2/FLU/RSV plus assay is intended as an aid in the diagnosis of influenza from Nasopharyngeal swab specimens and should not be used as a sole basis for treatment. Nasal washings and aspirates are unacceptable for Xpert Xpress SARS-CoV-2/FLU/RSV testing.  Fact Sheet for Patients: EntrepreneurPulse.com.au  Fact Sheet for Healthcare  Providers: IncredibleEmployment.be  This test is not yet approved or cleared by the [place] FDA and has been authorized for detection and/or diagnosis of SARS-CoV-2 by FDA under an Emergency Use Authorization (EUA). This EUA will remain in effect (meaning this test can be used) for the duration of the COVID-19 declaration under Section 564(b)(1) of the Act, 21 U.S.C. section 360bbb-3(b)(1), unless the authorization is terminated or revoked.  Performed at [facility], [place] [address], [place], [place] [address]   Respiratory (~20 pathogens) panel by PCR     Status: None   Collection Time: 01/08/22  7:45 AM   Specimen: Nasopharyngeal Swab; Respiratory  Result Value Ref Range   Adenovirus NOT DETECTED NOT DETECTED   Coronavirus 229E NOT DETECTED NOT DETECTED    Comment: (NOTE) The Coronavirus on the Respiratory Panel, DOES NOT test for the novel  Coronavirus (2019 nCoV)    Coronavirus HKU1 NOT DETECTED NOT DETECTED   Coronavirus NL63 NOT DETECTED NOT DETECTED   Coronavirus OC43 NOT DETECTED NOT DETECTED   Metapneumovirus NOT DETECTED NOT DETECTED   Rhinovirus / Enterovirus NOT DETECTED NOT DETECTED   Influenza A NOT DETECTED NOT DETECTED   Influenza B NOT DETECTED NOT DETECTED   Parainfluenza Virus 1 NOT DETECTED NOT DETECTED   Parainfluenza Virus 2 NOT DETECTED NOT DETECTED   Parainfluenza Virus 3 NOT DETECTED NOT DETECTED   Parainfluenza Virus 4 NOT DETECTED NOT DETECTED   Respiratory Syncytial Virus NOT DETECTED NOT DETECTED   Bordetella pertussis NOT DETECTED NOT DETECTED   Bordetella Parapertussis NOT DETECTED NOT DETECTED   Chlamydophila pneumoniae NOT DETECTED NOT DETECTED   Mycoplasma pneumoniae NOT DETECTED  NOT DETECTED    Comment: Performed at [facility], [place] [address], [place], [place] [address]  Troponin I (High Sensitivity)     Status: Abnormal   Collection Time: 01/08/22  7:49 AM  Result Value Ref Range   Troponin I (High  Sensitivity) 42 (H) <18 ng/L    Comment: (NOTE) Elevated high sensitivity troponin I (hsTnI) values and significant  changes across serial measurements may suggest ACS but many other  chronic and acute conditions are known to elevate hsTnI results.  Refer to the "Links" section for chest pain algorithms and additional  guidance. Performed at [facility], [place] [address], [place], [place] [address]   HIV Antibody (routine testing w rflx)     Status: None   Collection Time: 01/08/22  7:49 AM  Result Value Ref Range   HIV Screen 4th Generation wRfx Non Reactive Non Reactive    Comment: Performed at [facility], [place] [address] [place], [place] [address]  TSH     Status: Abnormal   Collection Time: 01/08/22  7:49 AM  Result Value Ref Range   TSH 13.946 (H) 0.350 - 4.500 uIU/mL    Comment: Performed by a 3rd Generation assay with a functional sensitivity of <=0.01 uIU/mL. Performed at [facility], [place] [address], [place],  [address]   Procalcitonin - Baseline     Status: None   Collection Time: 01/08/22  7:49 AM  Result Value Ref Range   Procalcitonin <0.10 ng/mL    Comment:        Interpretation: PCT (Procalcitonin) <= 0.5 ng/mL: Systemic infection (sepsis) is not likely. Local bacterial infection is possible. (NOTE)       Sepsis PCT Algorithm           Lower Respiratory Tract                                      Infection PCT Algorithm    ----------------------------     ----------------------------         PCT < 0.25 ng/mL                PCT < 0.10 ng/mL          Strongly encourage             Strongly discourage   discontinuation of antibiotics    initiation of antibiotics    ----------------------------     -----------------------------       PCT 0.25 - 0.50 ng/mL            PCT 0.10 - 0.25 ng/mL               OR       >80% decrease in PCT            Discourage initiation of                                            antibiotics      Encourage  discontinuation           of antibiotics    ----------------------------     -----------------------------         PCT >= 0.50 ng/mL  PCT 0.26 - 0.50 ng/mL               AND        <80% decrease in PCT             Encourage initiation of                                             antibiotics       Encourage continuation           of antibiotics    ----------------------------     -----------------------------        PCT >= 0.50 ng/mL                  PCT > 0.50 ng/mL               AND         increase in PCT                  Strongly encourage                                      initiation of antibiotics    Strongly encourage escalation           of antibiotics                                     -----------------------------                                           PCT <= 0.25 ng/mL                                                 OR                                        > 80% decrease in PCT                                      Discontinue / Do not initiate                                             antibiotics  Performed at [facility], [address], [place], [place] [address]   Troponin I (High Sensitivity)     Status: Abnormal   Collection Time: 01/08/22  1:38 PM  Result Value Ref Range   Troponin I (High Sensitivity) 24 (H) <18 ng/L    Comment: (NOTE) Elevated high sensitivity troponin I (hsTnI) values and significant  changes across serial measurements may suggest ACS but many other  chronic and acute conditions are known to  elevate hsTnI results.  Refer to the "Links" section for chest pain algorithms and additional  guidance. Performed at [facility], [place] [address], [place], [place] [address]    CT Angio Chest PE W and/or Wo Contrast  Result Date: 01/08/2022 CLINICAL DATA:  21 year old female with lupus and pulmonary artery hypertension. Chest pain since yesterday on the left. Shortness of breath. EXAM: CT ANGIOGRAPHY CHEST WITH  CONTRAST TECHNIQUE: Multidetector CT imaging of the chest was performed using the standard protocol during bolus administration of intravenous contrast. Multiplanar CT image reconstructions and MIPs were obtained to evaluate the vascular anatomy. RADIATION DOSE REDUCTION: This exam was performed according to the departmental dose-optimization program which includes automated exposure control, adjustment of the mA and/or kV according to patient size and/or use of iterative reconstruction technique. CONTRAST:  87m OMNIPAQUE IOHEXOL 350 MG/ML SOLN COMPARISON:  Chest radiographs 0232 hours today and earlier. Chest CTA 06/02/2020. FINDINGS: Cardiovascular: Excellent contrast bolus timing in the pulmonary arterial tree. Some progression of central pulmonary artery enlargement compared to the CTA last year, main PA now measures up to 34 mm diameter at the level of the ascending aorta, versus 27 mm last year. Likewise, enlarged left and right main pulmonary arteries and arteries at the hilum which taper. But no pulmonary artery filling defect, evidence of thrombus. Cardiac size remains within normal limits. Lower lung volumes. No definite pleural effusion. Negative visible aorta. No calcified coronary artery atherosclerosis is evident. Mediastinum/Nodes: No convincing mediastinal mass or lymphadenopathy. Lungs/Pleura: Lower lung volumes. Major airways remain patent. Trace or small bilateral layering pleural effusions are new. Additional mild widespread increased pulmonary interstitial opacity and patchy nonspecific peribronchial opacity which is greater in the left lung, especially the left lower lobe, and might be atelectasis. Upper Abdomen: A Patak steatosis appears resolved since last year. Negative visible liver, spleen, stomach, pancreas. Musculoskeletal: Negative. Review of the MIP images confirms the above findings. IMPRESSION: 1. Negative for acute pulmonary embolus. 2. Positive for central pulmonary artery  enlargement since a CTA last year, strongly suggesting Pulmonary Artery Hypertension. 3. New small/trace layering pleural effusions and lower lung volumes with nonspecific increased interstitial and peribronchial opacity. Differential considerations include atelectasis, interstitial edema, developing bilateral bronchopneumonia. Electronically Signed   By: HGenevie AnnM.D.   On: 01/08/2022 05:58   DG Chest 1 View  Result Date: 01/08/2022 CLINICAL DATA:  Chest pain for 2 days with shortness of breath EXAM: CHEST  1 VIEW COMPARISON:  08/04/2021 FINDINGS: Cardiac shadow is stable. The lungs are well aerated bilaterally with the exception of left basilar infiltrate. Small effusion is present as well. No bony abnormality is noted. IMPRESSION: Mild left basilar infiltrate. Electronically Signed   By: MInez CatalinaM.D.   On: 01/08/2022 02:48    Pending Labs Unresulted Labs (From admission, onward)     Start     Ordered   01/09/22 0500  CBC  Tomorrow morning,   R        01/08/22 0729   01/09/22 03546 Basic metabolic panel  Tomorrow morning,   R        01/08/22 0729   01/08/22 1110  Hepatic function panel  ONCE - STAT,   STAT        01/08/22 1109   01/08/22 1026  Osmolality, urine  Once,   R        01/08/22 1025   01/08/22 1025  Sodium, urine, random  Once,   R  01/08/22 1025   01/08/22 1009  Urinalysis, Routine w reflex microscopic  Once,   R        01/08/22 1008   01/08/22 0725  Legionella Pneumophila Serogp 1 Ur Ag  (COPD / Pneumonia / Cellulitis / Lower Extremity Wound)  Once,   R        01/08/22 0729   01/08/22 0725  Strep pneumoniae urinary antigen  (COPD / Pneumonia / Cellulitis / Lower Extremity Wound)  Once,   R        01/08/22 0729   01/08/22 0725  Lactic acid, plasma  STAT Now then every 3 hours,   R      01/08/22 0729   01/08/22 0724  Expectorated Sputum Assessment w Gram Stain, Rflx to Resp Cult  (COPD / Pneumonia / Cellulitis / Lower Extremity Wound)  Once,   R        01/08/22  0729            Vitals/Pain Today's Vitals   01/08/22 1225 01/08/22 1300 01/08/22 1412 01/08/22 1515  BP:  100/70  95/68  Pulse: 96 100  97  Resp: 15 (!) 24  (!) 22  Temp:   98 F (36.7 C)   TempSrc:      SpO2: 100% 98%  97%  PainSc:        Isolation Precautions Droplet precaution  Medications Medications  enoxaparin (LOVENOX) injection 40 mg (40 mg Subcutaneous Given 01/08/22 1014)  sodium chloride flush (NS) 0.9 % injection 3 mL (3 mLs Intravenous Not Given 01/08/22 1015)  acetaminophen (TYLENOL) tablet 650 mg (has no administration in time range)    Or  acetaminophen (TYLENOL) suppository 650 mg (has no administration in time range)  albuterol (PROVENTIL) (2.5 MG/3ML) 0.083% nebulizer solution 2.5 mg (has no administration in time range)  cefTRIAXone (ROCEPHIN) 2 g in sodium chloride 0.9 % 100 mL IVPB (0 g Intravenous Stopped 01/08/22 0934)  azithromycin (ZITHROMAX) tablet 500 mg (500 mg Oral Given 01/08/22 0816)  morphine (PF) 2 MG/ML injection 2 mg (has no administration in time range)  levothyroxine (SYNTHROID) tablet 150 mcg (has no administration in time range)  tadalafil (CIALIS) tablet 20 mg (20 mg Oral Given 01/08/22 1446)  acetaminophen (TYLENOL) tablet 650 mg (650 mg Oral Given 01/08/22 0430)  morphine (PF) 2 MG/ML injection 2 mg (2 mg Intravenous Given 01/08/22 0430)  iohexol (OMNIPAQUE) 350 MG/ML injection 75 mL (75 mLs Intravenous Contrast Given 01/08/22 0548)    Mobility walks Low fall risk   Focused Assessments Cardiac Assessment Handoff:  Cardiac Rhythm: Normal sinus rhythm Lab Results  Component Value Date   CKTOTAL 20 (L) 06/07/2020   Lab Results  Component Value Date   DDIMER 3.68 (H) 06/04/2020   Does the Patient currently have chest pain? No   , Pulmonary Assessment Handoff:  Lung sounds: Bilateral Breath Sounds: Clear O2 Device: Room Air      R Recommendations: See Admitting Provider Note  Report given to:   Additional  Notes:

## 2022-01-08 NOTE — ED Provider Notes (Signed)
[facility] EMERGENCY DEPARTMENT Provider Note   CSN: [id number] Arrival date & time: 01/08/22  0127     History  Chief Complaint  Patient presents with   Chest Pain   Shortness of Breath    [facility] Jessica Frazier is a 21 y.o. female with history of SLE and pulmonary arterial hypertension Following with cardiologist Dr. Aundra Dubin, currently on Opsumit and tadalafil who presents today with concern for chest pain that started yesterday and a few days of progressive worsening shortness of breath.  Of note patient did experience a miscarriage approximately 3 weeks ago at [redacted] weeks gestation, her first pregnancy. She states that while her symptoms feel similar to her pulmonary hypertension in the past, it has never been as severe as today.  She continues to endorse left-sided chest pressure and sensation that she cannot catch her breath.  I personally read her medical records.  Addition to the above listed she has history of Hashimoto's thyroiditis, cirrhosis, hypofibrinogenemia, and RA.  Following with cardio       Home Medications Prior to Admission medications   Medication Sig Start Date End Date Taking? Authorizing Provider  b complex vitamins capsule Take 1 capsule by mouth daily.   Yes [provider]  cholecalciferol (VITAMIN D3) 25 MCG (1000 UNIT) tablet Take 1,000 Units by mouth daily.   Yes [provider]  levothyroxine (SYNTHROID) 150 MCG tablet Take 150 mcg by mouth daily before breakfast. 07/22/21  Yes [provider]  tadalafil, PAH, (ADCIRCA) 20 MG tablet Take 1 tablet (20 mg total) by mouth daily. 12/18/21  Yes Larey Dresser, MD  Belimumab (BENLYSTA) 200 MG/ML SOAJ Inject 200 mg into the skin every Sunday. Patient not taking: Reported on 12/18/2021 06/15/21   [provider]  Selexipag (UPTRAVI) 200 & 800 MCG TBPK Take 200 mcg by mouth in the morning and at bedtime. Uptitrate every 2 weeks to maximum tolerated  dose or 1600 mcg BID Patient not taking: Reported on 01/08/2022    [provider]      Allergies    Hydroxychloroquine, Other, and Rinvoq [upadacitinib]    Review of Systems   Review of Systems  Respiratory:  Positive for shortness of breath.   Cardiovascular:  Positive for chest pain.    Physical Exam Updated Vital Signs BP 98/60   Pulse (!) 115   Temp 100.3 F (37.9 C) (Oral)   Resp (!) 30   SpO2 96%  Physical Exam Vitals and nursing note reviewed.  Constitutional:      Appearance: She is not ill-appearing or toxic-appearing.     Interventions: She is not intubated. HENT:     Head: Normocephalic and atraumatic.     Mouth/Throat:     Mouth: Mucous membranes are moist.     Pharynx: No oropharyngeal exudate or posterior oropharyngeal erythema.  Eyes:     General:        Right eye: No discharge.        Left eye: No discharge.     Conjunctiva/sclera: Conjunctivae normal.  Cardiovascular:     Rate and Rhythm: Regular rhythm. Tachycardia present.     Pulses: Normal pulses.     Heart sounds: Normal heart sounds. No murmur heard. Pulmonary:     Effort: Tachypnea and accessory muscle usage present. No bradypnea, prolonged expiration, respiratory distress or retractions. She is not intubated.     Breath sounds: Normal breath sounds. No wheezing or rales.     Comments: O2  sat 98% on RA. Abdominal:     General: Bowel sounds are normal. There is no distension.     Tenderness: There is no abdominal tenderness.  Musculoskeletal:        General: No deformity.     Cervical back: Neck supple.  Skin:    General: Skin is warm and dry.     Capillary Refill: Capillary refill takes less than 2 seconds.  Neurological:     General: No focal deficit present.     Mental Status: She is alert and oriented to person, place, and time. Mental status is at baseline.  Psychiatric:        Mood and Affect: Mood normal.    ED Results / Procedures / Treatments   Labs (all labs  ordered are listed, but only abnormal results are displayed) Labs Reviewed  BRAIN NATRIURETIC PEPTIDE - Abnormal; Notable for the following components:      Result Value   B Natriuretic Peptide 125.4 (*)    All other components within normal limits  CBC WITH DIFFERENTIAL/PLATELET - Abnormal; Notable for the following components:   Hemoglobin 11.4 (*)    HCT 35.9 (*)    RDW 18.7 (*)    All other components within normal limits  BASIC METABOLIC PANEL - Abnormal; Notable for the following components:   Sodium 132 (*)    CO2 20 (*)    Calcium 8.0 (*)    All other components within normal limits  TROPONIN I (HIGH SENSITIVITY) - Abnormal; Notable for the following components:   Troponin I (High Sensitivity) 32 (*)    All other components within normal limits  I-STAT BETA HCG BLOOD, ED ([facility], [facility], [facility] ONLY)  TROPONIN I (HIGH SENSITIVITY)    EKG EKG Interpretation  Date/Time:  Friday January 08 2022 02:02:51 EDT Ventricular Rate:  123 PR Interval:  154 QRS Duration: 82 QT Interval:  278 QTC Calculation: 398 R Axis:   152 Text Interpretation: Sinus tachycardia Right axis deviation Possible Right ventricular hypertrophy Nonspecific T wave abnormality Abnormal ECG When compared with ECG of 18-Nov-2021 14:43, No significant change was found Confirmed by Delora Fuel ([id number]) on 01/08/2022 3:54:51 AM  Radiology CT Angio Chest PE W and/or Wo Contrast  Result Date: 01/08/2022 CLINICAL DATA:  21 year old female with lupus and pulmonary artery hypertension. Chest pain since yesterday on the left. Shortness of breath. EXAM: CT ANGIOGRAPHY CHEST WITH CONTRAST TECHNIQUE: Multidetector CT imaging of the chest was performed using the standard protocol during bolus administration of intravenous contrast. Multiplanar CT image reconstructions and MIPs were obtained to evaluate the vascular anatomy. RADIATION DOSE REDUCTION: This exam was performed according to the departmental dose-optimization program  which includes automated exposure control, adjustment of the mA and/or kV according to patient size and/or use of iterative reconstruction technique. CONTRAST:  28m OMNIPAQUE IOHEXOL 350 MG/ML SOLN COMPARISON:  Chest radiographs 0232 hours today and earlier. Chest CTA 06/02/2020. FINDINGS: Cardiovascular: Excellent contrast bolus timing in the pulmonary arterial tree. Some progression of central pulmonary artery enlargement compared to the CTA last year, main PA now measures up to 34 mm diameter at the level of the ascending aorta, versus 27 mm last year. Likewise, enlarged left and right main pulmonary arteries and arteries at the hilum which taper. But no pulmonary artery filling defect, evidence of thrombus. Cardiac size remains within normal limits. Lower lung volumes. No definite pleural effusion. Negative visible aorta. No calcified coronary artery atherosclerosis is evident. Mediastinum/Nodes: No convincing mediastinal mass or lymphadenopathy. Lungs/Pleura:  Lower lung volumes. Major airways remain patent. Trace or small bilateral layering pleural effusions are new. Additional mild widespread increased pulmonary interstitial opacity and patchy nonspecific peribronchial opacity which is greater in the left lung, especially the left lower lobe, and might be atelectasis. Upper Abdomen: A Patak steatosis appears resolved since last year. Negative visible liver, spleen, stomach, pancreas. Musculoskeletal: Negative. Review of the MIP images confirms the above findings. IMPRESSION: 1. Negative for acute pulmonary embolus. 2. Positive for central pulmonary artery enlargement since a CTA last year, strongly suggesting Pulmonary Artery Hypertension. 3. New small/trace layering pleural effusions and lower lung volumes with nonspecific increased interstitial and peribronchial opacity. Differential considerations include atelectasis, interstitial edema, developing bilateral bronchopneumonia. Electronically Signed   By: Genevie Ann M.D.   On: 01/08/2022 05:58   DG Chest 1 View  Result Date: 01/08/2022 CLINICAL DATA:  Chest pain for 2 days with shortness of breath EXAM: CHEST  1 VIEW COMPARISON:  08/04/2021 FINDINGS: Cardiac shadow is stable. The lungs are well aerated bilaterally with the exception of left basilar infiltrate. Small effusion is present as well. No bony abnormality is noted. IMPRESSION: Mild left basilar infiltrate. Electronically Signed   By: Inez Catalina M.D.   On: 01/08/2022 02:48    Procedures Procedures    Medications Ordered in ED Medications  acetaminophen (TYLENOL) tablet 650 mg (650 mg Oral Given 01/08/22 0430)  morphine (PF) 2 MG/ML injection 2 mg (2 mg Intravenous Given 01/08/22 0430)  iohexol (OMNIPAQUE) 350 MG/ML injection 75 mL (75 mLs Intravenous Contrast Given 01/08/22 0548)    ED Course/ Medical Decision Making/ A&P                           Medical Decision Making 21 year old female who presents with concern for CP and SOB.   Tachycardic and tachypneic on my evaluation, O2 sats are normal.  Patient with some residual chest pressure about primary concern is shortness of breath.  Cardiopulmonary exam as above, lungs are clear to auscultation bilaterally, tachycardia with regular rhythm.  No swelling in the lower extremities.  DDx includes not limited to [place], PE, pneumonia, pneumothorax, CHF, ACS.   Amount and/or Complexity of Data Reviewed Labs:     Details:  CBC without leukocytosis, mild anemia with hemoglobin 11.4.  BMP with hyponatremia 132, troponin elevated to 32, delta troponin pending.  BNP normal.  Radiology:     Details:  Chest x-ray with left basilar infiltrate, CT PE study without PE but with pulmonary artery hypertension.   Risk OTC drugs. Prescription drug management. Decision regarding hospitalization.   Will cover for CAP. Consult to cardiologist Dr.Custovic, cardiologist who will see the patient this morning.  Agrees with plan for medical  admission.  Care of this patient signed out to oncoming ED provider J.Soto, PA-C at time of shift change.  All pertinent HPI, physical exam and laboratory findings were discussed with her primary partner.  Patient pending admission call at time of shift change.  Jessica Frazier and her mother  voiced understanding of her medical evaluation and treatment plan. Each of their questions answered to their expressed satisfaction.  Return precautions were given.  Patient is well-appearing, stable, and was discharged in good condition.  This chart was dictated using voice recognition software, Dragon. Despite the best efforts of this provider to proofread and correct errors, errors may still occur which can change documentation meaning.  Final Clinical Impression(s) / ED Diagnoses Final diagnoses:  Pulmonary arterial hypertension [facility])    Rx / DC Orders ED Discharge Orders     None         Aura Dials 44/92/01 0071    Delora Fuel, MD 21/97/58 2237

## 2022-01-08 NOTE — ED Notes (Signed)
ED Provider at bedside. 

## 2022-01-08 NOTE — ED Provider Triage Note (Signed)
Emergency Medicine Provider Triage Evaluation Note  [facility] Jessica Frazier , a 21 y.o. female with hx of SLE and PAH was evaluated in triage.  Pt complains of chest pain which began yesterday in her L chest. Pain worse with breathing. Developed SOB tonight which has been constant. No known alleviating factors. No medications taken PTA. Denies syncope, fever, hemoptysis. She has a hx of miscarriage 9 weeks ago.  Review of Systems  Positive: As above Negative: As above  Physical Exam  BP 113/68 (BP Location: Right Arm)   Pulse (!) 125   Temp 99.1 F (37.3 C)   Resp 16   SpO2 94%  Gen:   Awake, no distress   Resp:  Dyspnea w/o tachypnea. Lungs grossly CTAB MSK:   Moves extremities without difficulty  Other:  Tachycardic heart rate  Medical Decision Making  Medically screening exam initiated at 2:20 AM.  Appropriate orders placed.  [facility] Jessica Frazier was informed that the remainder of the evaluation will be completed by another provider, this initial triage assessment does not replace that evaluation, and the importance of remaining in the ED until their evaluation is complete.  Chest pain/SOB - plan for CTA chest to r/o PE especially in light of recent pregnancy, hx of SLE. She is tachycardic to the 120's in triage, SpO2 94% on RA. No acute distress at this time. Charge RN notified of need for room assignment.   Antonietta Breach, PA-C 01/08/22 1115

## 2022-01-09 ENCOUNTER — Inpatient Hospital Stay: Payer: Self-pay

## 2022-01-09 DIAGNOSIS — D849 Immunodeficiency, unspecified: Secondary | ICD-10-CM | POA: Diagnosis present

## 2022-01-09 DIAGNOSIS — Z833 Family history of diabetes mellitus: Secondary | ICD-10-CM | POA: Diagnosis not present

## 2022-01-09 DIAGNOSIS — M329 Systemic lupus erythematosus, unspecified: Secondary | ICD-10-CM | POA: Diagnosis present

## 2022-01-09 DIAGNOSIS — J90 Pleural effusion, not elsewhere classified: Secondary | ICD-10-CM | POA: Diagnosis present

## 2022-01-09 DIAGNOSIS — I272 Pulmonary hypertension, unspecified: Secondary | ICD-10-CM

## 2022-01-09 DIAGNOSIS — R0789 Other chest pain: Secondary | ICD-10-CM | POA: Diagnosis not present

## 2022-01-09 DIAGNOSIS — D649 Anemia, unspecified: Secondary | ICD-10-CM | POA: Diagnosis present

## 2022-01-09 DIAGNOSIS — Z79890 Hormone replacement therapy (postmenopausal): Secondary | ICD-10-CM | POA: Diagnosis not present

## 2022-01-09 DIAGNOSIS — I2781 Cor pulmonale (chronic): Secondary | ICD-10-CM | POA: Diagnosis present

## 2022-01-09 DIAGNOSIS — R7989 Other specified abnormal findings of blood chemistry: Secondary | ICD-10-CM | POA: Diagnosis not present

## 2022-01-09 DIAGNOSIS — M0600 Rheumatoid arthritis without rheumatoid factor, unspecified site: Secondary | ICD-10-CM | POA: Diagnosis present

## 2022-01-09 DIAGNOSIS — D688 Other specified coagulation defects: Secondary | ICD-10-CM | POA: Diagnosis present

## 2022-01-09 DIAGNOSIS — M069 Rheumatoid arthritis, unspecified: Secondary | ICD-10-CM | POA: Diagnosis present

## 2022-01-09 DIAGNOSIS — R6510 Systemic inflammatory response syndrome (SIRS) of non-infectious origin without acute organ dysfunction: Secondary | ICD-10-CM | POA: Diagnosis not present

## 2022-01-09 DIAGNOSIS — Z30017 Encounter for initial prescription of implantable subdermal contraceptive: Secondary | ICD-10-CM | POA: Diagnosis not present

## 2022-01-09 DIAGNOSIS — R0600 Dyspnea, unspecified: Secondary | ICD-10-CM | POA: Diagnosis not present

## 2022-01-09 DIAGNOSIS — E063 Autoimmune thyroiditis: Secondary | ICD-10-CM | POA: Diagnosis present

## 2022-01-09 DIAGNOSIS — R079 Chest pain, unspecified: Secondary | ICD-10-CM | POA: Diagnosis not present

## 2022-01-09 DIAGNOSIS — E876 Hypokalemia: Secondary | ICD-10-CM | POA: Diagnosis not present

## 2022-01-09 DIAGNOSIS — G510 Bell's palsy: Secondary | ICD-10-CM | POA: Diagnosis present

## 2022-01-09 DIAGNOSIS — I50812 Chronic right heart failure: Secondary | ICD-10-CM | POA: Diagnosis present

## 2022-01-09 DIAGNOSIS — Z79899 Other long term (current) drug therapy: Secondary | ICD-10-CM | POA: Diagnosis not present

## 2022-01-09 DIAGNOSIS — K7460 Unspecified cirrhosis of liver: Secondary | ICD-10-CM | POA: Diagnosis present

## 2022-01-09 DIAGNOSIS — J180 Bronchopneumonia, unspecified organism: Secondary | ICD-10-CM | POA: Diagnosis present

## 2022-01-09 DIAGNOSIS — E872 Acidosis, unspecified: Secondary | ICD-10-CM | POA: Diagnosis present

## 2022-01-09 DIAGNOSIS — Z20822 Contact with and (suspected) exposure to covid-19: Secondary | ICD-10-CM | POA: Diagnosis present

## 2022-01-09 DIAGNOSIS — E871 Hypo-osmolality and hyponatremia: Secondary | ICD-10-CM | POA: Diagnosis present

## 2022-01-09 DIAGNOSIS — J189 Pneumonia, unspecified organism: Secondary | ICD-10-CM | POA: Diagnosis not present

## 2022-01-09 DIAGNOSIS — I2721 Secondary pulmonary arterial hypertension: Secondary | ICD-10-CM | POA: Diagnosis present

## 2022-01-09 LAB — BASIC METABOLIC PANEL
Anion gap: 9 (ref 5–15)
BUN: 6 mg/dL (ref 6–20)
CO2: 20 mmol/L — ABNORMAL LOW (ref 22–32)
Calcium: 7.7 mg/dL — ABNORMAL LOW (ref 8.9–10.3)
Chloride: 104 mmol/L (ref 98–111)
Creatinine, Ser: 0.58 mg/dL (ref 0.44–1.00)
GFR, Estimated: >60 mL/min (ref >60)
Glucose, Bld: 87 mg/dL (ref 70–99)
Potassium: 3.8 mmol/L (ref 3.5–5.1)
Sodium: 133 mmol/L — ABNORMAL LOW (ref 135–145)

## 2022-01-09 LAB — CBC
HCT: 33.8 % — ABNORMAL LOW (ref 36.0–46.0)
Hemoglobin: 10.6 g/dL — ABNORMAL LOW (ref 12.0–15.0)
MCH: 26.7 pg (ref 26.0–34.0)
MCHC: 31.4 g/dL (ref 30.0–36.0)
MCV: 85.1 fL (ref 80.0–100.0)
Platelets: 320 10*3/uL (ref 150–400)
RBC: 3.97 MIL/uL (ref 3.87–5.11)
RDW: 18.8 % — ABNORMAL HIGH (ref 11.5–15.5)
WBC: 4.8 10*3/uL (ref 4.0–10.5)
nRBC: 0 % (ref 0.0–0.2)

## 2022-01-09 LAB — COOXEMETRY PANEL
Carboxyhemoglobin: 1.1 % (ref 0.5–1.5)
Carboxyhemoglobin: 3.1 % — ABNORMAL HIGH (ref 0.5–1.5)
Methemoglobin: 1.5 % (ref 0.0–1.5)
Methemoglobin: 2.1 % — ABNORMAL HIGH (ref 0.0–1.5)
O2 Saturation: 46.2 %
O2 Saturation: 63.2 %
Total hemoglobin: 10.5 g/dL — ABNORMAL LOW (ref 12.0–16.0)
Total hemoglobin: 9.6 g/dL — ABNORMAL LOW (ref 12.0–16.0)

## 2022-01-09 LAB — STREP PNEUMONIAE URINARY ANTIGEN: Strep Pneumo Urinary Antigen: NEGATIVE

## 2022-01-09 LAB — LACTIC ACID, PLASMA: Lactic Acid, Venous: 2.3 mmol/L (ref 0.5–1.9)

## 2022-01-09 LAB — C-REACTIVE PROTEIN: CRP: 4.2 mg/dL — ABNORMAL HIGH (ref <1.0)

## 2022-01-09 LAB — T4, FREE: Free T4: 1.06 ng/dL (ref 0.61–1.12)

## 2022-01-09 LAB — SEDIMENTATION RATE: Sed Rate: 35 mm/hr — ABNORMAL HIGH (ref 0–22)

## 2022-01-09 LAB — OSMOLALITY, URINE: Osmolality, Ur: 330 mOsm/kg (ref 300–900)

## 2022-01-09 MED ORDER — SODIUM CHLORIDE 0.9% FLUSH
10.0000 mL | Freq: Two times a day (BID) | INTRAVENOUS | Status: DC
  Administered 2022-01-09 – 2022-01-13 (×5): 10 mL

## 2022-01-09 MED ORDER — METOPROLOL TARTRATE 12.5 MG HALF TABLET
12.5000 mg | ORAL_TABLET | Freq: Two times a day (BID) | ORAL | Status: DC
  Administered 2022-01-09: 12.5 mg via ORAL
  Filled 2022-01-09: qty 1

## 2022-01-09 MED ORDER — GUAIFENESIN ER 600 MG PO TB12
1200.0000 mg | ORAL_TABLET | Freq: Two times a day (BID) | ORAL | Status: DC
  Administered 2022-01-09 – 2022-01-14 (×11): 1200 mg via ORAL
  Filled 2022-01-09 (×11): qty 2

## 2022-01-09 MED ORDER — DIGOXIN 125 MCG PO TABS
0.1250 mg | ORAL_TABLET | Freq: Every day | ORAL | Status: DC
  Administered 2022-01-09 – 2022-01-14 (×6): 0.125 mg via ORAL
  Filled 2022-01-09 (×6): qty 1

## 2022-01-09 MED ORDER — SODIUM CHLORIDE 0.9 % IV BOLUS
1000.0000 mL | Freq: Once | INTRAVENOUS | Status: AC
  Administered 2022-01-09: 1000 mL via INTRAVENOUS

## 2022-01-09 MED ORDER — MILRINONE LACTATE IN DEXTROSE 20-5 MG/100ML-% IV SOLN
0.1250 ug/kg/min | INTRAVENOUS | Status: DC
  Administered 2022-01-09 – 2022-01-11 (×3): 0.25 ug/kg/min via INTRAVENOUS
  Administered 2022-01-12: 0.125 ug/kg/min via INTRAVENOUS
  Filled 2022-01-09 (×4): qty 100

## 2022-01-09 MED ORDER — CHLORHEXIDINE GLUCONATE CLOTH 2 % EX PADS
6.0000 | MEDICATED_PAD | Freq: Every day | CUTANEOUS | Status: DC
  Administered 2022-01-09 – 2022-01-14 (×6): 6 via TOPICAL

## 2022-01-09 MED ORDER — SODIUM CHLORIDE 0.9% FLUSH: 10.0000 mL | INTRAVENOUS | Status: DC | PRN

## 2022-01-09 NOTE — Progress Notes (Signed)
Peripherally Inserted Central Catheter Placement  The IV Nurse has discussed with the patient and/or persons authorized to consent for the patient, the purpose of this procedure and the potential benefits and risks involved with this procedure.  The benefits include less needle sticks, lab draws from the catheter, and the patient may be discharged home with the catheter. Risks include, but not limited to, infection, bleeding, blood clot (thrombus formation), and puncture of an artery; nerve damage and irregular heartbeat and possibility to perform a PICC exchange if needed/ordered by physician.  Alternatives to this procedure were also discussed.  Bard Power PICC patient education guide, fact sheet on infection prevention and patient information card has been provided to patient /or left at bedside.    PICC Placement Documentation  PICC Double Lumen 46/65/99 Right Basilic 33 cm 2 cm (Active)  Indication for Insertion or Continuance of Line Vasoactive infusions;Chronic illness with exacerbations (CF, Sickle Cell, etc.) 01/09/22 1824  Exposed Catheter (cm) 2 cm 01/09/22 1824  Site Assessment Clean, Dry, Intact 01/09/22 1824  Lumen #1 Status Flushed;Saline locked;Blood return noted 01/09/22 1824  Lumen #2 Status Flushed;Saline locked;Blood return noted 01/09/22 1824  Dressing Type Transparent;Securing device 01/09/22 1824  Dressing Status Antimicrobial disc in place;Clean, Dry, Intact 01/09/22 1824  Safety Lock Not Applicable 35/70/17 7939  Line Care Connections checked and tightened 01/09/22 1824  Line Adjustment (NICU/IV Team Only) No 01/09/22 1824  Dressing Intervention New dressing 01/09/22 1824  Dressing Change Due 01/16/22 01/09/22 1824       Rolena Infante 01/09/2022, 6:25 PM

## 2022-01-09 NOTE — Progress Notes (Signed)
Lactic Acid results 2.2 called to MD,V. Rathmore. No new orders received and patient denies complaints. Will continue to monitor closely.

## 2022-01-09 NOTE — Progress Notes (Addendum)
PROGRESS NOTE    Jessica Frazier Lamount Cohen  YJE:[id number] DOB: 01/24/2001 DOA: 01/08/2022 PCP: [name], NP     Brief Narrative:  H/o SLE on Belimumab, pulmonary hypertension/RV failure, presents with left side chest pain, sob, cough, mild elevated troponin, CTA negative for PE, procalcitonin unremarkable, check esr/crp, cards consulted  Subjective:  She spike fever 100.5, she is tachycardic, heart rate went up to 140 at one point,  initially tachypnea on presentation, RR appears has improved some, bp low normal, wbc, cr wnl. Continue to cough, appear nonproductive  Reports feeling weak Had diarrhea for a week , appears is slowing down Poor oral intake, not able to keep food down with vomiting last week  Assessment & Plan:  Principal Problem:   Chest pain Active Problems:   Elevated troponin   SIRS (systemic inflammatory response syndrome) (HCC)   CAP (community acquired pneumonia)   Dyspnea   Diastolic dysfunction   Hyponatremia   Systemic lupus erythematosus (HCC)   Normocytic anemia   Acquired hypothyroidism   Diarrhea  CAP?  Vs aspiration pneumonia?  ( Reported vomiting at home) -presents with pleuritic chest pain , dyspnea, she spiked fever, has tachycardia, low normal bp, slightly elevated lactic acid at 2.3 -CTA no PE but showed "New small/trace layering pleural effusions and lower lung volumes with nonspecific increased interstitial and peribronchial opacity. Differential considerations include atelectasis, interstitial edema, developing bilateral bronchopneumonia." -procalcitonin unremarkable, wbc wnl, respiratory viral panel negative -due to immunosuppressed status , will check blood culture, f/u on urine strep /legionella antigen test, sputum culture pending collection  Sinus tachycardia Received brief hydration, low dose lopressor, check tsh  Mild hyponatremia Urine sodium 23, possible dry intravascularly ( she reports vomiting and  diarrhea at home for about a week), received  brief hydration, encourage oral intake, repeat bmp in am  Pulmonary hypertension/RV failure -management per cardiology/heart failure team   SLE /RA /immunosuppressed status  Hold Belimumab  ( subQ qsunday) due to concerning for infection  Hypothyroidism Continue synthroid    Skin Assessment: I have examined the patient's skin and I agree with the wound assessment as performed by the wound care RN as outlined below: Pressure Injury 06/16/20 Coccyx Medial Stage 2 -  Partial thickness loss of dermis presenting as a shallow open injury with a red, pink wound bed without slough. small area with broken skin (Active)  06/16/20 1530  Location: Coccyx  Location Orientation: Medial  Staging: Stage 2 -  Partial thickness loss of dermis presenting as a shallow open injury with a red, pink wound bed without slough.  Wound Description (Comments): small area with broken skin  Present on Admission: Yes     I have Reviewed nursing notes, Vitals, pain scores, I/o's, Lab results and  imaging results since pt's last encounter, details please see discussion above  I ordered the following labs:  Unresulted Labs (From admission, onward)     Start     Ordered   01/10/22 0500  CBC with Differential/Platelet  Tomorrow morning,   R       Question:  Specimen collection method  Answer:  Lab=Lab collect   01/09/22 1354   01/10/22 0500  Comprehensive metabolic panel  Tomorrow morning,   R       Question:  Specimen collection method  Answer:  Lab=Lab collect   01/09/22 1354   01/10/22 0500  Magnesium  Tomorrow morning,   R       Question:  Specimen collection method  Answer:  Lab=Lab collect   01/09/22 1354   01/10/22 0500  Phosphorus  Tomorrow morning,   R       Question:  Specimen collection method  Answer:  Lab=Lab collect   01/09/22 1354   01/09/22 1407  Culture, blood (Routine X 2) w Reflex to ID Panel  BLOOD CULTURE X 2,   R (with TIMED occurrences)       01/09/22 1406   01/08/22 0725  Legionella Pneumophila Serogp 1 Ur Ag  (COPD / Pneumonia / Cellulitis / Lower Extremity Wound)  Once,   R        01/08/22 0729   01/08/22 0725  Strep pneumoniae urinary antigen  (COPD / Pneumonia / Cellulitis / Lower Extremity Wound)  Once,   R        01/08/22 0729   01/08/22 0724  Expectorated Sputum Assessment w Gram Stain, Rflx to Resp Cult  (COPD / Pneumonia / Cellulitis / Lower Extremity Wound)  Once,   R        01/08/22 0729             DVT prophylaxis: enoxaparin (LOVENOX) injection 40 mg Start: 01/08/22 1000   Code Status:   Code Status: Full Code  Family Communication: mother at bedside  Disposition:   Status is: Observation  Dispo: The patient is from: home              Anticipated d/c is to: home              Anticipated d/c date is: TBD, needs heart failure team clearance   Antimicrobials:    Anti-infectives (From admission, onward)    Start     Dose/Rate Route Frequency Ordered Stop   01/08/22 0800  cefTRIAXone (ROCEPHIN) 2 g in sodium chloride 0.9 % 100 mL IVPB        2 g 200 mL/hr over 30 Minutes Intravenous Every 24 hours 01/08/22 0729 01/13/22 0759   01/08/22 0800  azithromycin (ZITHROMAX) tablet 500 mg        500 mg Oral Daily 01/08/22 0729 01/13/22 0759          Objective: Vitals:   01/09/22 0414 01/09/22 0759 01/09/22 0818 01/09/22 1153  BP:  104/70 104/70 (!) 95/52  Pulse:   (!) 126   Resp:  _0 Temp:  98.7 F (37.1 C) 98.7 F (37.1 C) 98.8 F (37.1 C)  TempSrc:  Oral Oral Oral  SpO2: 92% 94% 93% 92%  Weight: 55.3 kg     Height:       No intake or output data in the 24 hours ending 01/09/22 1409  Filed Weights   01/08/22 1848 01/09/22 0414  Weight: 53.6 kg 55.3 kg    Examination:  General exam: appear weak Respiratory system: Clear to auscultation. Respiratory effort normal. Cardiovascular system:  sinus tachycardia Gastrointestinal system: Abdomen is nondistended, soft and nontender.   Normal bowel sounds heard. Central nervous system: Alert and oriented. No focal neurological deficits. Extremities:  no edema Skin: No rashes, lesions or ulcers Psychiatry: Judgement and insight appear normal. Mood & affect appropriate.     Data Reviewed: I have personally reviewed  labs and visualized  imaging studies since the last encounter and formulate the plan        Scheduled Meds:  azithromycin  500 mg Oral Daily   enoxaparin (LOVENOX) injection  40 mg Subcutaneous Q24H   guaiFENesin  1,200 mg Oral BID   levothyroxine  150 mcg  Oral QAC breakfast   metoprolol tartrate  12.5 mg Oral BID   sodium chloride flush  3 mL Intravenous Q12H   tadalafil  20 mg Oral Daily   Continuous Infusions:  cefTRIAXone (ROCEPHIN)  IV 2 g (01/09/22 0828)     LOS: 0 days     Florencia Reasons, MD PhD FACP [facility]  Available via Epic secure chat 7am-7pm for nonurgent issues Please page for urgent issues To page the attending provider between 7A-7P or the covering provider during after hours 7P-7A, please log into the web site [email or web address] and access using universal [place] password for that web site. If you do not have the password, please call the hospital operator.    01/09/2022, 2:09 PM

## 2022-01-09 NOTE — Plan of Care (Signed)

## 2022-01-09 NOTE — Progress Notes (Signed)
Pt has a mild fever this afternoon (99.2), blood cultures drawn, pt was asking about PICC line with her mother being on bedside, pt's concerned were addressed. After a dose of morphine this morning, pt is feeling better, ambulating to the bathroom. Denies any recent SOB, chest pain and distress, will continue to monitor.

## 2022-01-09 NOTE — Progress Notes (Signed)
Advanced Heart Failure Rounding Note  PCP-Cardiologist: None   Subjective:    Says CP is better but still coughing up white phlegm.   Tmax 100.5  Remains tachy in 120s. Lactic acid persistently elevated 2.2-2.3 range    Objective:   Weight Range: 55.3 kg Body mass index is 22.3 kg/m.   Vital Signs:   Temp:  [97.7 F (36.5 C)-100.5 F (38.1 C)] 98.8 F (37.1 C) (10/28 1153) Pulse Rate:  [98-126] 126 (10/28 0818) Resp:  [16-30] 16 (10/28 1153) BP: (95-109)/(52-71) 95/52 (10/28 1153) SpO2:  [92 %-100 %] 92 % (10/28 1153) Weight:  [53.6 kg-55.3 kg] 55.3 kg (10/28 0414) Last BM Date : 01/07/22  Weight change: Filed Weights   01/08/22 1848 01/09/22 0414  Weight: 53.6 kg 55.3 kg    Intake/Output:  No intake or output data in the 24 hours ending 01/09/22 1515    Physical Exam    General:  Weak appearing. No resp difficulty HEENT: Normal Neck: Supple. JVP to jaw. Carotids 2+ bilat; no bruits. No lymphadenopathy or thyromegaly appreciated. Cor: PMI nondisplaced. Regular tachy/ + RV lift 2/6 TR Lungs: Clear Abdomen: Soft, nontender, nondistended. No hepatosplenomegaly. No bruits or masses. Good bowel sounds. Extremities: No cyanosis, clubbing, rash, edema Cool Neuro: Alert & orientedx3, cranial nerves grossly intact. moves all 4 extremities w/o difficulty. Affect pleasant   Telemetry   Sinus tach 120s Personally reviewed   Labs    CBC Recent Labs    01/08/22 0225 01/09/22 0014  WBC 6.0 4.8  NEUTROABS 4.3  --   HGB 11.4* 10.6*  HCT 35.9* 33.8*  MCV 86.5 85.1  PLT 372 622   Basic Metabolic Panel Recent Labs    01/08/22 0225 01/09/22 0014  NA 132* 133*  K 3.7 3.8  CL 102 104  CO2 20* 20*  GLUCOSE 86 87  BUN 6 6  CREATININE 0.64 0.58  CALCIUM 8.0* 7.7*   Liver Function Tests Recent Labs    01/08/22 1822  AST 67*  ALT 29  ALKPHOS 65  BILITOT 0.8  PROT 7.8  ALBUMIN 2.1*   No results for input(s): "LIPASE", "AMYLASE" in the last 72  hours. Cardiac Enzymes No results for input(s): "CKTOTAL", "CKMB", "CKMBINDEX", "TROPONINI" in the last 72 hours.  BNP: BNP (last 3 results) Recent Labs    11/18/21 1534 01/08/22 0225  BNP 100.5* 125.4*    ProBNP (last 3 results) No results for input(s): "PROBNP" in the last 8760 hours.   D-Dimer No results for input(s): "DDIMER" in the last 72 hours. Hemoglobin A1C No results for input(s): "HGBA1C" in the last 72 hours. Fasting Lipid Panel No results for input(s): "CHOL", "HDL", "LDLCALC", "TRIG", "CHOLHDL", "LDLDIRECT" in the last 72 hours. Thyroid Function Tests Recent Labs    01/08/22 0749  TSH 13.946*    Other results:   Imaging    No results found.   Medications:     Scheduled Medications:  azithromycin  500 mg Oral Daily   enoxaparin (LOVENOX) injection  40 mg Subcutaneous Q24H   guaiFENesin  1,200 mg Oral BID   levothyroxine  150 mcg Oral QAC breakfast   metoprolol tartrate  12.5 mg Oral BID   sodium chloride flush  3 mL Intravenous Q12H   tadalafil  20 mg Oral Daily    Infusions:  cefTRIAXone (ROCEPHIN)  IV 2 g (01/09/22 0828)    PRN Medications: acetaminophen **OR** acetaminophen, albuterol, morphine injection   Assessment/Plan     1. 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 Reports improvement in functional status, NYHA Class IIIb>>II - O2 sats table on RA, 98%  - continue tadalafil 20 mg daily  - Opsumit 10 mg daily was supposed to be restarted but I do not see on MAR. Will check with PharmdD - she is markedly tachycardic and has persistently elevated lactic acid. I am concerned for low output state/shock due to RV failure - Place PICC and check CVP  co-ox. Suspect she will need milrinone - stop b-blocker - start digoxin  2. Acute hypoxic respiratory failure with infiltrates on CT - management per IM - azithromycin + ceftriaxone - COVID negative - Respiratory panel pending  - ? PNA versus lupus flare - Would consider Pulmonary involvement   3. SLE: On belimumab.  Followed by Dr. Lenna Gilford.   4. Recent Pregnancy  - s/p recent miscarriage at [redacted] wks gestation, confirmed by OB/GYN  - ? hCG < 5.0.  - needs to avoid future pregnancies in setting of severe PAH and Optsumit use  - stressed need for effective contraception    5. Hypothyroidism - on Levothyroxine     Length of Stay: 0  Glori Bickers, MD  01/09/2022, 3:15 PM  Advanced Heart Failure Team Pager [phone] (M-F; 7a - 5p)  Please contact [facility] for night-coverage after hours (5p -7a ) and weekends on [email or web address]

## 2022-01-10 DIAGNOSIS — I2721 Secondary pulmonary arterial hypertension: Principal | ICD-10-CM

## 2022-01-10 LAB — CBC WITH DIFFERENTIAL/PLATELET
Abs Immature Granulocytes: 0.03 10*3/uL (ref 0.00–0.07)
Basophils Absolute: 0 10*3/uL (ref 0.0–0.1)
Basophils Relative: 0 %
Eosinophils Absolute: 0.2 10*3/uL (ref 0.0–0.5)
Eosinophils Relative: 3 %
HCT: 30.9 % — ABNORMAL LOW (ref 36.0–46.0)
Hemoglobin: 9.7 g/dL — ABNORMAL LOW (ref 12.0–15.0)
Immature Granulocytes: 1 %
Lymphocytes Relative: 23 %
Lymphs Abs: 1.2 10*3/uL (ref 0.7–4.0)
MCH: 27.2 pg (ref 26.0–34.0)
MCHC: 31.4 g/dL (ref 30.0–36.0)
MCV: 86.6 fL (ref 80.0–100.0)
Monocytes Absolute: 0.5 10*3/uL (ref 0.1–1.0)
Monocytes Relative: 9 %
Neutro Abs: 3.3 10*3/uL (ref 1.7–7.7)
Neutrophils Relative %: 64 %
Platelets: 303 10*3/uL (ref 150–400)
RBC: 3.57 MIL/uL — ABNORMAL LOW (ref 3.87–5.11)
RDW: 18.6 % — ABNORMAL HIGH (ref 11.5–15.5)
WBC: 5.2 10*3/uL (ref 4.0–10.5)
nRBC: 0 % (ref 0.0–0.2)

## 2022-01-10 LAB — COOXEMETRY PANEL
Carboxyhemoglobin: 1.7 % — ABNORMAL HIGH (ref 0.5–1.5)
Methemoglobin: 1 % (ref 0.0–1.5)
O2 Saturation: 63.8 %
Total hemoglobin: 10.1 g/dL — ABNORMAL LOW (ref 12.0–16.0)

## 2022-01-10 LAB — COMPREHENSIVE METABOLIC PANEL
ALT: 26 U/L (ref 0–44)
AST: 53 U/L — ABNORMAL HIGH (ref 15–41)
Albumin: 1.9 g/dL — ABNORMAL LOW (ref 3.5–5.0)
Alkaline Phosphatase: 63 U/L (ref 38–126)
Anion gap: 3 — ABNORMAL LOW (ref 5–15)
BUN: 5 mg/dL — ABNORMAL LOW (ref 6–20)
CO2: 22 mmol/L (ref 22–32)
Calcium: 7.7 mg/dL — ABNORMAL LOW (ref 8.9–10.3)
Chloride: 110 mmol/L (ref 98–111)
Creatinine, Ser: 0.61 mg/dL (ref 0.44–1.00)
GFR, Estimated: >60 mL/min (ref >60)
Glucose, Bld: 90 mg/dL (ref 70–99)
Potassium: 3.5 mmol/L (ref 3.5–5.1)
Sodium: 135 mmol/L (ref 135–145)
Total Bilirubin: 0.6 mg/dL (ref 0.3–1.2)
Total Protein: 7.2 g/dL (ref 6.5–8.1)

## 2022-01-10 LAB — LACTIC ACID, PLASMA: Lactic Acid, Venous: 2.2 mmol/L (ref 0.5–1.9)

## 2022-01-10 LAB — MAGNESIUM: Magnesium: 1.8 mg/dL (ref 1.7–2.4)

## 2022-01-10 LAB — PHOSPHORUS: Phosphorus: 3.4 mg/dL (ref 2.5–4.6)

## 2022-01-10 MED ORDER — IBUPROFEN 200 MG PO TABS
400.0000 mg | ORAL_TABLET | Freq: Once | ORAL | Status: AC | PRN
  Administered 2022-01-12: 400 mg via ORAL
  Filled 2022-01-10: qty 2

## 2022-01-10 MED ORDER — TADALAFIL 20 MG PO TABS
40.0000 mg | ORAL_TABLET | Freq: Every day | ORAL | Status: DC
  Administered 2022-01-11 – 2022-01-14 (×4): 40 mg via ORAL
  Filled 2022-01-10 (×4): qty 2

## 2022-01-10 MED ORDER — TADALAFIL 20 MG PO TABS
20.0000 mg | ORAL_TABLET | Freq: Once | ORAL | Status: AC
  Administered 2022-01-10: 20 mg via ORAL
  Filled 2022-01-10: qty 1

## 2022-01-10 NOTE — Progress Notes (Signed)
Pharmacy Consult for Pulmonary Hypertension Treatment   Indication - Initiation of PAH medication while hospitalized  Patient is 21 y.o. and MD asked to newly started on Macitentan (Opsumit).   In order to use this medication as an inpatient, monitoring parameters per [facility] requirements must be met.  Chronic therapy will under the supervision of Dr. Bensimhion who is enrolled in the [facility] program and is initiating therapy.  On admission pregnancy risk has been assessed and pregnancy test dated 01/08/22 was negative. Hepatic function has been evaluated and stable.       Latest Ref Rng & Units 01/10/2022    3:38 AM 01/08/2022    6:22 PM 07/14/2020    1:35 PM  Hepatic Function  Total Protein 6.5 - 8.1 g/dL 7.2  7.8  8.4   Albumin 3.5 - 5.0 g/dL 1.9  2.1  3.4   AST 15 - 41 U/L 53  67  56   ALT 0 - 44 U/L 26  29  58   Alk Phosphatase 38 - 126 U/L 63  65  102   Total Bilirubin 0.3 - 1.2 mg/dL 0.6  0.8  0.5   Bilirubin, Direct 0.0 - 0.2 mg/dL  0.3       The patient has been educated on Pregnancy Risk and Hepatotoxicity   - Patient refuses birth control  - ERA was not ordered  Patient did agree to discuss contraceptive options  with primary MD in the future -   If any question arise or pregnancy is identified during hospitalization, contact for bosentan: [phone]; macitentan: [phone]; ambrisentan: [phone].   Lisa Curran Pharm.D. CPP, BCPS Clinical Pharmacist [phone] 01/10/2022 4:31 PM    

## 2022-01-10 NOTE — Plan of Care (Signed)

## 2022-01-10 NOTE — Progress Notes (Signed)
PROGRESS NOTE    Jessica Frazier  DUK:[id number] DOB: 2001-03-04 DOA: 01/08/2022 PCP: [name], NP     Brief Narrative:  H/o SLE on Belimumab, pulmonary hypertension/RV failure, presents with left side chest pain, sob, cough, mild elevated troponin, CTA negative for PE, procalcitonin unremarkable, check esr/crp, cards consulted  Subjective:  No fever, reports no cough at night, but had coughing spells when got up to the bathroom this am, reports pleuritic chest pain when coughing, no chest pain otherwise.   she is tachycardic, borderline bp Reports feeling weak Had diarrhea for a week , appears is slowing down Poor oral intake, not able to keep food down with vomiting last week  Assessment & Plan:  Principal Problem:   Chest pain Active Problems:   Elevated troponin   SIRS (systemic inflammatory response syndrome) (HCC)   CAP (community acquired pneumonia)   Dyspnea   Diastolic dysfunction   Hyponatremia   Systemic lupus erythematosus (HCC)   Normocytic anemia   Acquired hypothyroidism   Diarrhea  CAP?  Vs aspiration pneumonia?  ( Reported vomiting at home) -presents with pleuritic chest pain , dyspnea, fever 100.5 initially,  -CTA no PE but showed "New small/trace layering pleural effusions and lower lung volumes with nonspecific increased interstitial and peribronchial opacity. Differential considerations include atelectasis, interstitial edema, developing bilateral bronchopneumonia." -procalcitonin unremarkable, wbc wnl, respiratory viral panel negative - blood culture no growth, f/u on urine strep pneumo antigen negative, urine legionella antigen test pending, sputum culture pending collection Continue rocephin/zithromax  Pulmonary hypertension/RV failure/sinus tachycardia/lactic acidosis -management per cardiology/heart failure team  Mild hyponatremia Sodium 132 on presentation, today is 135  Monitor     SLE /RA /immunosuppressed  status  Hold Belimumab  ( subQ qsunday) due to concerning for infection Mild elevated esr/crp, SLE flares?she does not think she has flare as she does not have joint pain  Hypothyroidism Continue synthroid    Skin Assessment: I have examined the patient's skin and I agree with the wound assessment as performed by the wound care RN as outlined below: Pressure Injury 06/16/20 Coccyx Medial Stage 2 -  Partial thickness loss of dermis presenting as a shallow open injury with a red, pink wound bed without slough. small area with broken skin (Active)  06/16/20 1530  Location: Coccyx  Location Orientation: Medial  Staging: Stage 2 -  Partial thickness loss of dermis presenting as a shallow open injury with a red, pink wound bed without slough.  Wound Description (Comments): small area with broken skin  Present on Admission: Yes     I have Reviewed nursing notes, Vitals, pain scores, I/o's, Lab results and  imaging results since pt's last encounter, details please see discussion above  I ordered the following labs:  Unresulted Labs (From admission, onward)     Start     Ordered   01/10/22 3762  Basic metabolic panel  Daily at 5am,   R     Comments: While on milrinone.   Question:  Specimen collection method  Answer:  Lab=Lab collect   01/09/22 1816   01/10/22 0500  Cooxemetry Panel (carboxy, met, total hgb, O2 sat)  Daily at 5am,   R     Question:  Specimen collection method  Answer:  Lab=Lab collect   01/09/22 1816   01/09/22 1525  Cooxemetry Panel (carboxy, met, total hgb, O2 sat)  Daily at 5am,   R     Question:  Specimen collection method  Answer:  Lab=Lab collect  01/09/22 1524   01/09/22 1407  Culture, blood (Routine X 2) w Reflex to ID Panel  BLOOD CULTURE X 2,   R (with TIMED occurrences)      01/09/22 1406   01/08/22 0725  Legionella Pneumophila Serogp 1 Ur Ag  (COPD / Pneumonia / Cellulitis / Lower Extremity Wound)  Once,   R        01/08/22 0729   01/08/22 0724   Expectorated Sputum Assessment w Gram Stain, Rflx to Resp Cult  (COPD / Pneumonia / Cellulitis / Lower Extremity Wound)  Once,   R        01/08/22 0729             DVT prophylaxis: enoxaparin (LOVENOX) injection 40 mg Start: 01/08/22 1000   Code Status:   Code Status: Full Code  Family Communication: significant other  at bedside  Disposition:   Dispo: The patient is from: home              Anticipated d/c is to: home              Anticipated d/c date is: TBD, needs heart failure team clearance   Antimicrobials:    Anti-infectives (From admission, onward)    Start     Dose/Rate Route Frequency Ordered Stop   01/08/22 0800  cefTRIAXone (ROCEPHIN) 2 g in sodium chloride 0.9 % 100 mL IVPB        2 g 200 mL/hr over 30 Minutes Intravenous Every 24 hours 01/08/22 0729 01/13/22 0759   01/08/22 0800  azithromycin (ZITHROMAX) tablet 500 mg        500 mg Oral Daily 01/08/22 0729 01/13/22 0759          Objective: Vitals:   01/09/22 2315 01/10/22 0331 01/10/22 0726 01/10/22 1150  BP: 104/66 97/66 108/75 110/70  Pulse: (!) 111 (!) 109 (!) 112 (!) 118  Resp: _0 Temp: 98.1 F (36.7 C) 98.1 F (36.7 C) 98.6 F (37 C) 98.5 F (36.9 C)  TempSrc: Oral Oral Oral Oral  SpO2:  94% 92% 91%  Weight:      Height:        Intake/Output Summary (Last 24 hours) at 01/10/2022 1215 Last data filed at 01/10/2022 1149 Gross per 24 hour  Intake 400 ml  Output 1000 ml  Net -600 ml    Filed Weights   01/08/22 1848 01/09/22 0414  Weight: 53.6 kg 55.3 kg    Examination:  General exam: appear weak Respiratory system: Clear to auscultation. Respiratory effort normal. Cardiovascular system:  sinus tachycardia Gastrointestinal system: Abdomen is nondistended, soft and nontender.  Normal bowel sounds heard. Central nervous system: Alert and oriented. No focal neurological deficits. Extremities:  no edema Skin: No rashes, lesions or ulcers Psychiatry: Judgement and insight  appear normal. Mood & affect appropriate.     Data Reviewed: I have personally reviewed  labs and visualized  imaging studies since the last encounter and formulate the plan        Scheduled Meds:  azithromycin  500 mg Oral Daily   Chlorhexidine Gluconate Cloth  6 each Topical Daily   digoxin  0.125 mg Oral Daily   enoxaparin (LOVENOX) injection  40 mg Subcutaneous Q24H   guaiFENesin  1,200 mg Oral BID   levothyroxine  150 mcg Oral QAC breakfast   sodium chloride flush  10-40 mL Intracatheter Q12H   sodium chloride flush  3 mL Intravenous Q12H   tadalafil  20  mg Oral Daily   Continuous Infusions:  cefTRIAXone (ROCEPHIN)  IV 2 g (01/10/22 0855)   milrinone 0.25 mcg/kg/min (01/10/22 1149)     LOS: 1 day     Florencia Reasons, MD PhD FACP [facility]  Available via Epic secure chat 7am-7pm for nonurgent issues Please page for urgent issues To page the attending provider between 7A-7P or the covering provider during after hours 7P-7A, please log into the web site [email or web address] and access using universal [place] password for that web site. If you do not have the password, please call the hospital operator.    01/10/2022, 12:15 PM

## 2022-01-10 NOTE — Progress Notes (Signed)
Advanced Heart Failure Rounding Note  PCP-Cardiologist: None   Subjective:    Started on milrinone yesterday due to co-ox 46% and lactic acidosis. Feeling better. Still with cough. No fevers or chills. No orthopnea or PND.   Co-ox 63% Lactic acid still 2.2 CVP 9  Remains tachycardic.  Objective:   Weight Range: 55.3 kg Body mass index is 22.3 kg/m.   Vital Signs:   Temp:  [98.1 F (36.7 C)-99.2 F (37.3 C)] 98.6 F (37 C) (10/29 0726) Pulse Rate:  [109-113] 112 (10/29 0726) Resp:  [16] 16 (10/29 0726) BP: (95-108)/(52-75) 108/75 (10/29 0726) SpO2:  [92 %-98 %] 92 % (10/29 0726) Last BM Date : 01/09/22  Weight change: Filed Weights   01/08/22 1848 01/09/22 0414  Weight: 53.6 kg 55.3 kg    Intake/Output:   Intake/Output Summary (Last 24 hours) at 01/10/2022 1142 Last data filed at 01/10/2022 0800 Gross per 24 hour  Intake 600 ml  Output 1000 ml  Net -400 ml      Physical Exam    General:  Weak appearing. No resp difficulty HEENT: normal Neck: supple. JVP 9 Carotids 2+ bilat; no bruits. No lymphadenopathy or thryomegaly appreciated. Cor: PMI nondisplaced. Regular tachy  + RV lift 2/6 TR Lungs: clear Abdomen: soft, nontender, nondistended. No hepatosplenomegaly. No bruits or masses. Good bowel sounds. Extremities: no cyanosis, clubbing, rash, edema Neuro: alert & orientedx3, cranial nerves grossly intact. moves all 4 extremities w/o difficulty. Affect pleasant   Telemetry   Sinus tach 110-120 Personally reviewed   Labs    CBC Recent Labs    01/08/22 0225 01/09/22 0014 01/10/22 0338  WBC 6.0 4.8 5.2  NEUTROABS 4.3  --  3.3  HGB 11.4* 10.6* 9.7*  HCT 35.9* 33.8* 30.9*  MCV 86.5 85.1 86.6  PLT 372 320 630    Basic Metabolic Panel Recent Labs    01/09/22 0014 01/10/22 0338  NA 133* 135  K 3.8 3.5  CL 104 110  CO2 20* 22  GLUCOSE 87 90  BUN 6 5*  CREATININE 0.58 0.61  CALCIUM 7.7* 7.7*  MG  --  1.8  PHOS  --  3.4    Liver  Function Tests Recent Labs    01/08/22 1822 01/10/22 0338  AST 67* 53*  ALT 29 26  ALKPHOS 65 63  BILITOT 0.8 0.6  PROT 7.8 7.2  ALBUMIN 2.1* 1.9*    No results for input(s): "LIPASE", "AMYLASE" in the last 72 hours. Cardiac Enzymes No results for input(s): "CKTOTAL", "CKMB", "CKMBINDEX", "TROPONINI" in the last 72 hours.  BNP: BNP (last 3 results) Recent Labs    11/18/21 1534 01/08/22 0225  BNP 100.5* 125.4*     ProBNP (last 3 results) No results for input(s): "PROBNP" in the last 8760 hours.   D-Dimer No results for input(s): "DDIMER" in the last 72 hours. Hemoglobin A1C No results for input(s): "HGBA1C" in the last 72 hours. Fasting Lipid Panel No results for input(s): "CHOL", "HDL", "LDLCALC", "TRIG", "CHOLHDL", "LDLDIRECT" in the last 72 hours. Thyroid Function Tests Recent Labs    01/08/22 0749  TSH 13.946*     Other results:   Imaging    [place] EKG SITE RITE  Result Date: 01/09/2022 If Site Rite image not attached, placement could not be confirmed due to current cardiac rhythm.    Medications:     Scheduled Medications:  azithromycin  500 mg Oral Daily   Chlorhexidine Gluconate Cloth  6 each Topical Daily  digoxin  0.125 mg Oral Daily   enoxaparin (LOVENOX) injection  40 mg Subcutaneous Q24H   guaiFENesin  1,200 mg Oral BID   levothyroxine  150 mcg Oral QAC breakfast   sodium chloride flush  10-40 mL Intracatheter Q12H   sodium chloride flush  3 mL Intravenous Q12H   tadalafil  20 mg Oral Daily    Infusions:  cefTRIAXone (ROCEPHIN)  IV 2 g (01/10/22 0855)   milrinone 0.25 mcg/kg/min (01/09/22 1853)    PRN Medications: acetaminophen **OR** acetaminophen, albuterol, sodium chloride flush   Assessment/Plan     1. 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 - O2 sats table on RA, 98%  - Increase tadalafil to 40 mg daily  - Continue digoxin. B-blocker stopped.  - Opsumit 10 mg daily was supposed to be restarted but I do not see on MAR. Will check with PharmdD. I asked her BF tobring it from home.  - PICC placed 10/28 initial co-ox 46% c/w low output RV failure. Now on milrinone 0.25. Co-ox improved to 63%. But lactic acid still elevated  - Will need to try to get on combination therapy with PDE-5 and ERA and try to wean milrinone and get RHC off milrinone to see if she will need to be considered for IV therapies.   2. Acute hypoxic respiratory failure with infiltrates on CT - management per IM - azithromycin + ceftriaxone - COVID negative - Respiratory panel pending  - ? PNA versus lupus flare. SLE flare less liekly without hematuria or joint symptoms    3. SLE: On belizumab.  Followed by Dr. Lenna Gilford.   4. Recent Pregnancy  - s/p recent miscarriage at [redacted] wks gestation, confirmed by OB/GYN  - ? hCG < 5.0.  - needs to avoid future pregnancies in setting of severe PAH and Optsumit use  - stressed need for effective contraception    5. Hypothyroidism - on Levothyroxine     Length of Stay: 1  Glori Bickers, MD  01/10/2022, 11:42 AM  Advanced Heart Failure Team Pager [phone] (M-F; 7a - 5p)  Please contact [facility] for night-coverage after hours (5p -7a ) and weekends on [email or web address]

## 2022-01-11 DIAGNOSIS — I272 Pulmonary hypertension, unspecified: Secondary | ICD-10-CM | POA: Diagnosis not present

## 2022-01-11 DIAGNOSIS — J189 Pneumonia, unspecified organism: Secondary | ICD-10-CM | POA: Diagnosis not present

## 2022-01-11 DIAGNOSIS — R079 Chest pain, unspecified: Secondary | ICD-10-CM | POA: Diagnosis not present

## 2022-01-11 DIAGNOSIS — M329 Systemic lupus erythematosus, unspecified: Secondary | ICD-10-CM | POA: Diagnosis not present

## 2022-01-11 LAB — BASIC METABOLIC PANEL
Anion gap: 4 — ABNORMAL LOW (ref 5–15)
BUN: <5 mg/dL — ABNORMAL LOW (ref 6–20)
CO2: 21 mmol/L — ABNORMAL LOW (ref 22–32)
Calcium: 7.8 mg/dL — ABNORMAL LOW (ref 8.9–10.3)
Chloride: 110 mmol/L (ref 98–111)
Creatinine, Ser: 0.59 mg/dL (ref 0.44–1.00)
GFR, Estimated: >60 mL/min (ref >60)
Glucose, Bld: 87 mg/dL (ref 70–99)
Potassium: 3.4 mmol/L — ABNORMAL LOW (ref 3.5–5.1)
Sodium: 135 mmol/L (ref 135–145)

## 2022-01-11 LAB — LEGIONELLA PNEUMOPHILA SEROGP 1 UR AG: L. pneumophila Serogp 1 Ur Ag: NEGATIVE

## 2022-01-11 LAB — COOXEMETRY PANEL
Carboxyhemoglobin: 1.3 % (ref 0.5–1.5)
Methemoglobin: 0.9 % (ref 0.0–1.5)
O2 Saturation: 64 %
Total hemoglobin: 10.4 g/dL — ABNORMAL LOW (ref 12.0–16.0)

## 2022-01-11 LAB — LACTIC ACID, PLASMA
Lactic Acid, Venous: 1.3 mmol/L (ref 0.5–1.9)
Lactic Acid, Venous: 2 mmol/L (ref 0.5–1.9)

## 2022-01-11 MED ORDER — POTASSIUM CHLORIDE CRYS ER 20 MEQ PO TBCR: 40.0000 meq | EXTENDED_RELEASE_TABLET | Freq: Once | ORAL | Status: DC

## 2022-01-11 MED ORDER — MACITENTAN 10 MG PO TABS
10.0000 mg | ORAL_TABLET | Freq: Every day | ORAL | Status: DC
  Filled 2022-01-11 (×2): qty 1

## 2022-01-11 MED ORDER — POTASSIUM CHLORIDE CRYS ER 20 MEQ PO TBCR
40.0000 meq | EXTENDED_RELEASE_TABLET | Freq: Two times a day (BID) | ORAL | Status: AC
  Administered 2022-01-11 (×2): 40 meq via ORAL
  Filled 2022-01-11 (×2): qty 2

## 2022-01-11 MED ORDER — FUROSEMIDE 10 MG/ML IJ SOLN
40.0000 mg | Freq: Once | INTRAMUSCULAR | Status: AC
  Administered 2022-01-11: 40 mg via INTRAVENOUS
  Filled 2022-01-11: qty 4

## 2022-01-11 MED ORDER — MACITENTAN 10 MG PO TABS: 10.0000 mg | ORAL_TABLET | Freq: Every day | ORAL | Status: DC

## 2022-01-11 NOTE — Progress Notes (Signed)
Paged NP Ferrel Logan with critical lactic acid of 2.0. No new orders at this time.

## 2022-01-11 NOTE — Progress Notes (Addendum)
Advanced Heart Failure Rounding Note  PCP-Cardiologist: None   Subjective:    Started on milrinone 10/28 d/t co-ox 46% and lactic acidosis.   Co-ox 64% Lactic acid still 2.2 CVP 7/8  Remains tachycardic.  Feeling fine, cough improving. No fevers or chills. No orthopnea or PND.   Objective:   Weight Range: 55.6 kg Body mass index is 22.42 kg/m.   Vital Signs:   Temp:  [97.9 F (36.6 C)-98.8 F (37.1 C)] 98.2 F (36.8 C) (10/30 0358) Pulse Rate:  [109-123] 111 (10/30 0358) Resp:  [14-18] 18 (10/30 0358) BP: (98-110)/(63-81) 110/81 (10/30 0358) SpO2:  [91 %-96 %] 96 % (10/30 0358) Weight:  [55.4 kg-55.6 kg] 55.6 kg (10/30 0358) Last BM Date : 01/09/22  Weight change: Filed Weights   01/09/22 0414 01/10/22 1614 01/11/22 0358  Weight: 55.3 kg 55.4 kg 55.6 kg    Intake/Output:   Intake/Output Summary (Last 24 hours) at 01/11/2022 0746 Last data filed at 01/10/2022 1600 Gross per 24 hour  Intake 869.22 ml  Output 900 ml  Net -30.78 ml     Physical Exam  CVP 7/8 General:  weak appearing. No respiratory difficulty HEENT: normal Neck: supple. JVD~7 cm. Carotids 2+ bilat; no bruits. No lymphadenopathy or thyromegaly appreciated. Cor: PMI nondisplaced. Regular tachy. + RV lift 2/6 TR Lungs: clear but shallow Abdomen: soft, nontender, nondistended. No hepatosplenomegaly. No bruits or masses. Good bowel sounds. Extremities: no cyanosis, clubbing, rash, edema  Neuro: alert & oriented x 3, cranial nerves grossly intact. moves all 4 extremities w/o difficulty. Affect pleasant.  Telemetry   Sinus tach 110s (Personally reviewed)    Labs    CBC Recent Labs    01/09/22 0014 01/10/22 0338  WBC 4.8 5.2  NEUTROABS  --  3.3  HGB 10.6* 9.7*  HCT 33.8* 30.9*  MCV 85.1 86.6  PLT 320 989   Basic Metabolic Panel Recent Labs    01/10/22 0338 01/11/22 0455  NA 135 135  K 3.5 3.4*  CL 110 110  CO2 22 21*  GLUCOSE 90 87  BUN 5* <5*  CREATININE 0.61 0.59   CALCIUM 7.7* 7.8*  MG 1.8  --   PHOS 3.4  --    Liver Function Tests Recent Labs    01/08/22 1822 01/10/22 0338  AST 67* 53*  ALT 29 26  ALKPHOS 65 63  BILITOT 0.8 0.6  PROT 7.8 7.2  ALBUMIN 2.1* 1.9*   No results for input(s): "LIPASE", "AMYLASE" in the last 72 hours. Cardiac Enzymes No results for input(s): "CKTOTAL", "CKMB", "CKMBINDEX", "TROPONINI" in the last 72 hours.  BNP: BNP (last 3 results) Recent Labs    11/18/21 1534 01/08/22 0225  BNP 100.5* 125.4*    ProBNP (last 3 results) No results for input(s): "PROBNP" in the last 8760 hours.   D-Dimer No results for input(s): "DDIMER" in the last 72 hours. Hemoglobin A1C No results for input(s): "HGBA1C" in the last 72 hours. Fasting Lipid Panel No results for input(s): "CHOL", "HDL", "LDLCALC", "TRIG", "CHOLHDL", "LDLDIRECT" in the last 72 hours. Thyroid Function Tests Recent Labs    01/08/22 0749  TSH 13.946*    Other results:   Imaging    No results found.   Medications:     Scheduled Medications:  azithromycin  500 mg Oral Daily   Chlorhexidine Gluconate Cloth  6 each Topical Daily   digoxin  0.125 mg Oral Daily   enoxaparin (LOVENOX) injection  40 mg Subcutaneous Q24H   guaiFENesin  1,200 mg Oral BID   levothyroxine  150 mcg Oral QAC breakfast   sodium chloride flush  10-40 mL Intracatheter Q12H   sodium chloride flush  3 mL Intravenous Q12H   tadalafil  40 mg Oral Daily    Infusions:  cefTRIAXone (ROCEPHIN)  IV 2 g (01/10/22 0855)   milrinone 0.25 mcg/kg/min (01/10/22 1149)    PRN Medications: acetaminophen **OR** acetaminophen, albuterol, ibuprofen, sodium chloride flush   Assessment/Plan   1. 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 Strongly suspect group 1 PH related to her SLE - Reportedly on  combination therapy w/ tadalafil 20 mg daily + Optsumit 10 mg. - O2 sats table on RA, 99%  - Continue tadalafil 40 mg daily  - Continue digoxin. B-blocker stopped.  - Opsumit 10 mg daily was supposed to be restarted. Pt spoke with pharmacist and currently refusing birth control - ERA not ordered. Open to redicussion - PICC placed 10/28 initial co-ox 46% c/w low output RV failure. Now on milrinone 0.25. Co-ox 64% today. lactic acid still elevated  - Will need to try to get on combination therapy with PDE-5 and ERA (pending birth control) and try to wean milrinone and get RHC off milrinone to see if she will need to be considered for IV therapies.   2. Acute hypoxic respiratory failure with infiltrates on CT - management per IM - azithromycin + ceftriaxone - COVID negative - Respiratory panel pending  - ? PNA versus lupus flare. SLE flare less likely without hematuria or joint symptoms    3. SLE - On belizumab.  Followed by Dr. Lenna Gilford.   4. Recent Pregnancy  - s/p recent miscarriage at [redacted] wks gestation, confirmed by OB/GYN  - ? hCG < 5.0.  - needs to avoid future pregnancies in setting of severe PAH and possible Optsumit use  - stressed need for effective contraception    5. Hypothyroidism - on Levothyroxine   Length of Stay: [place] AGACNP-BC  01/11/2022, 7:46 AM  Advanced Heart Failure Team Pager [phone] (M-F; 7a - 5p)  Please contact [facility] for night-coverage after hours (5p -7a ) and weekends on [email or web address]  Patient seen with NP, agree with the above note.   CVP 11 on my read.  Co-ox 64% this morning on milrinone 0.25, no repeat lactate.   Denies dyspnea.   General: NAD Neck: JVP 8-9 cm, no thyromegaly or thyroid nodule.  Lungs: Clear to auscultation bilaterally with normal respiratory effort. CV: Nondisplaced PMI.  Heart regular S1/S2 with loud P2, no S3/S4, no murmur.  No peripheral edema.   Abdomen: Soft, nontender, no hepatosplenomegaly, no distention.   Skin: Intact without lesions or rashes.  Neurologic: Alert and oriented x 3.  Psych: Normal affect. Extremities: No clubbing or cyanosis.  HEENT: Normal.   1. Pulmonary hypertension with cor pulmonale: Patient had severe PAH by 7/23 RHC.  Echo in 2023 showed mildly dilated RV with mild RV systolic dysfunction, PASP was elevated at 86 mmHg.  V/Q scan did not suggest the presence of chronic PEs.  HIV negative.  No known liver disease.  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 Echo in 10/23 showed EF 55-60%, D-shaped septum, moderate RV enlargement with moderate RV dysfunction, PASP 124 mmHg.  I think she is  going to need aggressive treatment of her PH via pulmonary vasodilators.  She got pregnant recently but had miscarriage, this has limited treatment to tadalafil.  This admission, low output by co-ox and started on milrinone 0.25.  Today, co-ox 64% with CVP 11.   - Continue tadalafil 40 mg daily.  - She is willing to start contraception as outpatient after long conversation today about the need for contraception with PH meds and high risk of mortality if she were to try to carry a pregnancy to term.  Will start Opsumit 10 mg daily today.   - We will need to try to get her selexipag soon.   - Lasix 40 mg IV x 1 and replace K, follow response.  - Will try to wean milrinone starting tomorrow, will repeat RHC when off milrinone to reassess PA pressure and cardiac output to see if we need to consider her sooner for IV PH therapy.  - She will eventually need a sleep study.  2. SLE: On belimumab.  Followed by Dr. Lenna Gilford. Symptoms not typical of SLE flare, CRP only mildly elevated.  3. ?PNA: She is on ceftrixaone/azithromycin but PCT < 0.1 and viral panel negative.  WBCs not elevated.  ?If presentation was due to cor pulmonale in setting of fluid shifts while she was pregnant (has now miscarried).   Loralie Champagne 01/11/2022 8:29 AM

## 2022-01-11 NOTE — TOC Progression Note (Signed)
Transition of Care [facility]) - Progression Note    Patient Details  Name: Jessica Frazier MRN: [id number] Date of Birth: 2001/02/03  Transition of Care [facility]) CM/SW Contact  Zenon Mayo, RN Phone Number: 01/11/2022, 4:13 PM  Clinical Narrative:    from home, chest pain, sob, conts on iv lasix , iv abx, and milrinone,  TOC following.        Expected Discharge Plan and Services                                                 Social Determinants of Health (SDOH) Interventions    Readmission Risk Interventions     No data to display

## 2022-01-11 NOTE — Progress Notes (Signed)
PROGRESS NOTE    Jessica Frazier  NFA:[id number] DOB: 10-07-2000 DOA: 01/08/2022 PCP: [name], NP     Brief Narrative:  Patient is a 21 years old female with past medical history of SLE, seronegative rheumatoid arthritis, pulmonary hypertension/RV failure, recent miscarriage, presented to the hospital with left-sided chest pain, shortness of breath and cough.  In the ED, patient was noted to be febrile, tachycardic with tachypnea.  Initial labs showed a sodium of 132, BNP 125.  Troponin with mildly elevated at 32-42.  Patient was then admitted hospital for further evaluation and treatment.  Assessment & Plan:  Principal Problem:   Chest pain Active Problems:   Elevated troponin   SIRS (systemic inflammatory response syndrome) (HCC)   CAP (community acquired pneumonia)   Dyspnea   Diastolic dysfunction   Hyponatremia   Systemic lupus erythematosus (HCC)   Normocytic anemia   Acquired hypothyroidism   Diarrhea  Chest pain.  Possible pneumonia Patient initially presented with pleuritic chest pain and shortness of breath and fever of 100.32F.  CTA chest without PE but some effusion and decreased lung volume with increased interstitial and peribronchial opacity.  Procalcitonin was negative.  WBC within normal limit.  Respiratory viral panel was negative.  Blood cultures negative so far.  Strep urinary antigen was negative.  Legionella urinary antigen pending.  On Rocephin and Zithromax.  We will continue to complete the course.  Pulmonary hypertension/RV failure/sinus tachycardia/lactic acidosis Heart failure team on board.  Checking co-oximetry panel daily.  Continue digoxin, Lasix x1 today, macitentan, milrinone and tadalafil.  2D echocardiogram from 12/18/2021 showed LV ejection fraction of 55 to 60% with no regional wall motion abnormality but right ventricular systolic function is reduced with severely elevated pulmonary artery pressure at 124 mmHg.  Follow  cardiology recommendations  Hypokalemia.  Mild.  Will replace.  Check levels in AM.  On 40 meq  of potassium x2 today.  Mild hyponatremia Improved.  Sodium of 135 today.  SLE /RA /immunosuppressed status  Continue to hold belimumab -receives subQ qsunday) due to concerning for infection.  Has mildly elevated ESR CRP  Hypothyroidism Continue synthroid  Pressure injury coccyx stage II.  Present on admission.  Continue wound care. Pressure Injury 06/16/20 Coccyx Medial Stage 2 -  Partial thickness loss of dermis presenting as a shallow open injury with a red, pink wound bed without slough. small area with broken skin (Active)  06/16/20 1530  Location: Coccyx  Location Orientation: Medial  Staging: Stage 2 -  Partial thickness loss of dermis presenting as a shallow open injury with a red, pink wound bed without slough.  Wound Description (Comments): small area with broken skin  Present on Admission: Yes    DVT prophylaxis: enoxaparin (LOVENOX) injection 40 mg Start: 01/08/22 1000   Code Status:   Code Status: Full Code  Family Communication:    Disposition: Home Dispo: The patient is from: home                  Anticipated d/c date is: As per cardiology recommendation.  Antimicrobials:    Anti-infectives (From admission, onward)    Start     Dose/Rate Route Frequency Ordered Stop   01/08/22 0800  cefTRIAXone (ROCEPHIN) 2 g in sodium chloride 0.9 % 100 mL IVPB        2 g 200 mL/hr over 30 Minutes Intravenous Every 24 hours 01/08/22 0729 01/13/22 0759   01/08/22 0800  azithromycin (ZITHROMAX) tablet 500 mg  500 mg Oral Daily 01/08/22 0729 01/13/22 0759       Subjective:  Today, patient was seen and examined at bedside.  Patient complains of loose bowel movements.  Denies nausea vomiting abdominal pain fever or chills.  Denies dyspnea or chest pain.  Objective: Vitals:   01/10/22 1928 01/11/22 0001 01/11/22 0358 01/11/22 0804  BP: 98/63 110/66 110/81 103/64   Pulse: (!) 123 (!) 109 (!) 111 (!) 112  Resp: _0 Temp: 97.9 F (36.6 C) 98 F (36.7 C) 98.2 F (36.8 C) 98.6 F (37 C)  TempSrc: Oral Oral Oral Oral  SpO2: 94% 95% 96% 94%  Weight:   55.6 kg   Height:        Intake/Output Summary (Last 24 hours) at 01/11/2022 0854 Last data filed at 01/10/2022 1600 Gross per 24 hour  Intake 669.22 ml  Output 600 ml  Net 69.22 ml     Filed Weights   01/09/22 0414 01/10/22 1614 01/11/22 0358  Weight: 55.3 kg 55.4 kg 55.6 kg    Examination: Body mass index is 22.42 kg/m.  General:  Average built, not in obvious distress HENT:   No scleral pallor or icterus noted. Oral mucosa is moist.  Prominent neck veins. Chest:  Clear breath sounds.  Diminished breath sounds bilaterally. No crackles or wheezes.  CVS: S1 &S2 heard.  Systolic murmur noted.  Regular rate and rhythm.  Tachycardic. Abdomen: Soft, nontender, nondistended.  Bowel sounds are heard.   Extremities: No cyanosis, clubbing or edema.  Peripheral pulses are palpable. Psych: Alert, awake and oriented, normal mood CNS:  No cranial nerve deficits.  Power equal in all extremities.   Skin: Warm and dry.  No rashes noted.   Data Reviewed: I personally reviewed the labs and imaging studies.     Scheduled Meds:  azithromycin  500 mg Oral Daily   Chlorhexidine Gluconate Cloth  6 each Topical Daily   digoxin  0.125 mg Oral Daily   enoxaparin (LOVENOX) injection  40 mg Subcutaneous Q24H   furosemide  40 mg Intravenous Once   guaiFENesin  1,200 mg Oral BID   levothyroxine  150 mcg Oral QAC breakfast   macitentan  10 mg Oral Daily   potassium chloride  40 mEq Oral BID   sodium chloride flush  10-40 mL Intracatheter Q12H   sodium chloride flush  3 mL Intravenous Q12H   tadalafil  40 mg Oral Daily   Continuous Infusions:  cefTRIAXone (ROCEPHIN)  IV 2 g (01/10/22 0855)   milrinone 0.25 mcg/kg/min (01/10/22 1149)     LOS: 2 days    Flora Lipps, MD  [facility] 01/11/2022, 8:54 AM

## 2022-01-12 DIAGNOSIS — I272 Pulmonary hypertension, unspecified: Secondary | ICD-10-CM | POA: Diagnosis not present

## 2022-01-12 DIAGNOSIS — R0789 Other chest pain: Secondary | ICD-10-CM

## 2022-01-12 DIAGNOSIS — R7989 Other specified abnormal findings of blood chemistry: Secondary | ICD-10-CM | POA: Diagnosis not present

## 2022-01-12 DIAGNOSIS — R0600 Dyspnea, unspecified: Secondary | ICD-10-CM | POA: Diagnosis not present

## 2022-01-12 DIAGNOSIS — J189 Pneumonia, unspecified organism: Secondary | ICD-10-CM | POA: Diagnosis not present

## 2022-01-12 LAB — BASIC METABOLIC PANEL
Anion gap: 9 (ref 5–15)
BUN: <5 mg/dL — ABNORMAL LOW (ref 6–20)
CO2: 21 mmol/L — ABNORMAL LOW (ref 22–32)
Calcium: 7.9 mg/dL — ABNORMAL LOW (ref 8.9–10.3)
Chloride: 104 mmol/L (ref 98–111)
Creatinine, Ser: 0.6 mg/dL (ref 0.44–1.00)
GFR, Estimated: >60 mL/min (ref >60)
Glucose, Bld: 107 mg/dL — ABNORMAL HIGH (ref 70–99)
Potassium: 3.9 mmol/L (ref 3.5–5.1)
Sodium: 134 mmol/L — ABNORMAL LOW (ref 135–145)

## 2022-01-12 LAB — COOXEMETRY PANEL
Carboxyhemoglobin: 1.1 % (ref 0.5–1.5)
Methemoglobin: 1.2 % (ref 0.0–1.5)
O2 Saturation: 60.6 %
Total hemoglobin: 10.9 g/dL — ABNORMAL LOW (ref 12.0–16.0)

## 2022-01-12 LAB — HEPATIC FUNCTION PANEL
ALT: 28 U/L (ref 0–44)
AST: 54 U/L — ABNORMAL HIGH (ref 15–41)
Albumin: 2 g/dL — ABNORMAL LOW (ref 3.5–5.0)
Alkaline Phosphatase: 69 U/L (ref 38–126)
Bilirubin, Direct: <0.1 mg/dL (ref 0.0–0.2)
Total Bilirubin: 0.6 mg/dL (ref 0.3–1.2)
Total Protein: 7.8 g/dL (ref 6.5–8.1)

## 2022-01-12 LAB — CBC
HCT: 32 % — ABNORMAL LOW (ref 36.0–46.0)
Hemoglobin: 10.1 g/dL — ABNORMAL LOW (ref 12.0–15.0)
MCH: 27.2 pg (ref 26.0–34.0)
MCHC: 31.6 g/dL (ref 30.0–36.0)
MCV: 86.3 fL (ref 80.0–100.0)
Platelets: 272 10*3/uL (ref 150–400)
RBC: 3.71 MIL/uL — ABNORMAL LOW (ref 3.87–5.11)
RDW: 18.7 % — ABNORMAL HIGH (ref 11.5–15.5)
WBC: 6.2 10*3/uL (ref 4.0–10.5)
nRBC: 0 % (ref 0.0–0.2)

## 2022-01-12 LAB — MAGNESIUM: Magnesium: 1.6 mg/dL — ABNORMAL LOW (ref 1.7–2.4)

## 2022-01-12 MED ORDER — LIDOCAINE HCL 1 % IJ SOLN
0.0000 mL | Freq: Once | INTRAMUSCULAR | Status: DC | PRN
  Filled 2022-01-12: qty 20

## 2022-01-12 MED ORDER — MAGNESIUM OXIDE -MG SUPPLEMENT 400 (240 MG) MG PO TABS
400.0000 mg | ORAL_TABLET | Freq: Two times a day (BID) | ORAL | Status: DC
  Administered 2022-01-12 – 2022-01-14 (×5): 400 mg via ORAL
  Filled 2022-01-12 (×5): qty 1

## 2022-01-12 MED ORDER — POTASSIUM CHLORIDE CRYS ER 20 MEQ PO TBCR
40.0000 meq | EXTENDED_RELEASE_TABLET | Freq: Once | ORAL | Status: AC
  Administered 2022-01-12: 40 meq via ORAL
  Filled 2022-01-12: qty 2

## 2022-01-12 MED ORDER — FUROSEMIDE 20 MG PO TABS
20.0000 mg | ORAL_TABLET | Freq: Every day | ORAL | Status: DC
  Administered 2022-01-12 – 2022-01-14 (×3): 20 mg via ORAL
  Filled 2022-01-12 (×3): qty 1

## 2022-01-12 MED ORDER — MACITENTAN 10 MG PO TABS
10.0000 mg | ORAL_TABLET | Freq: Every day | ORAL | Status: DC
  Administered 2022-01-13 – 2022-01-14 (×2): 10 mg via ORAL
  Filled 2022-01-12 (×3): qty 1

## 2022-01-12 MED ORDER — ETONOGESTREL 68 MG ~~LOC~~ IMPL
68.0000 mg | DRUG_IMPLANT | Freq: Once | SUBCUTANEOUS | Status: AC
  Administered 2022-01-13: 68 mg via SUBCUTANEOUS
  Filled 2022-01-12: qty 1

## 2022-01-12 NOTE — Consult Note (Signed)
Reason for Consult: Birth Control prior to starting medicaton Referring Physician: Ronnald Nian Jessica Frazier is an 21 y.o. female.   Pertinent Gynecological History: Menses: regular every month without intermenstrual spotting Bleeding: normal pattern and amount Contraception: none DES exposure: denies Previous GYN Procedures:  none   Last mammogram: n/a Last pap: Never, needs one--will get he an appt at [facility] [place] which is near her home OB History: G1, P0010   Menstrual History: LMP 12/17/21 when she miscarried; no intercourse or bleeding sense; UPT negative    Past Medical History:  Diagnosis Date   Anemia    Phreesia 07/22/2020   Arthritis    Phreesia 07/22/2020   Bell's palsy    Hypothyroidism    Rheumatoid arthritis ([place])    Thyroid disease    Phreesia 07/22/2020    Past Surgical History:  Procedure Laterality Date   RIGHT HEART CATH N/A 10/08/2021   Procedure: RIGHT HEART CATH;  Surgeon: Larey Dresser, MD;  Location: [facility];  Service: Cardiovascular;  Laterality: N/A;    Family History  Problem Relation Age of Onset   Graves' disease Mother    Healthy Father    Diabetes Maternal Grandfather     Social History:  reports that she has never smoked. She has never used smokeless tobacco. She reports that she does not currently use alcohol. She reports that she does not use drugs.  Allergies:  Allergies  Allergen Reactions   Hydroxychloroquine     Other reaction(s): dyspnea   Other Diarrhea and Other (See Comments)    Omega XL   Rinvoq [Upadacitinib] Other (See Comments)    Enlarged the patient's liver- had to come to the ED, 2022    Medications: I have reviewed the patient's current medications. Scheduled:  Chlorhexidine Gluconate Cloth  6 each Topical Daily   digoxin  0.125 mg Oral Daily   enoxaparin (LOVENOX) injection  40 mg Subcutaneous Q24H   etonogestrel  68 mg Subdermal Once   furosemide  20 mg Oral  Daily   guaiFENesin  1,200 mg Oral BID   levothyroxine  150 mcg Oral QAC breakfast   macitentan  10 mg Oral Daily   magnesium oxide  400 mg Oral BID   sodium chloride flush  10-40 mL Intracatheter Q12H   sodium chloride flush  3 mL Intravenous Q12H   tadalafil  40 mg Oral Daily   Continuous:  milrinone 0.125 mcg/kg/min (01/12/22 1058)    Review of Systems  Eyes: Negative.   Respiratory:  Positive for cough.   Cardiovascular:        Tachycardia  Gastrointestinal:  Negative for abdominal pain.  Genitourinary:  Negative for dysuria, menstrual problem, pelvic pain, vaginal bleeding and vaginal discharge.  Musculoskeletal:  Positive for arthralgias and joint swelling.  Psychiatric/Behavioral: Negative.      Blood pressure 115/63, pulse (!) 130, temperature 98 F (36.7 C), temperature source Oral, resp. rate 18, height _0  (1.575 m), weight 53.1 kg, SpO2 97 %. Physical Exam Vitals reviewed.  Constitutional:      General: She is not in acute distress.    Appearance: She is well-developed.  HENT:     Head: Normocephalic and atraumatic.  Eyes:     Conjunctiva/sclera: Conjunctivae normal.  Neck:     Thyroid: No thyromegaly.  Cardiovascular:     Rate and Rhythm: Regular rhythm. Tachycardia present.  Pulmonary:     Effort: Pulmonary effort is normal.     Breath  sounds: Normal breath sounds.  Abdominal:     Palpations: Abdomen is soft.     Tenderness: There is no abdominal tenderness. There is no guarding or rebound.  Musculoskeletal:        General: No tenderness. Normal range of motion.     Cervical back: Neck supple.  Skin:    General: Skin is warm and dry.  Neurological:     Mental Status: She is alert and oriented to person, place, and time.  Psychiatric:        Mood and Affect: Mood normal.        Behavior: Behavior normal.     Results for orders placed or performed during the hospital encounter of 01/08/22 (from the past 48 hour(s))  Cooxemetry Panel (carboxy,  met, total hgb, O2 sat)     Status: Abnormal   Collection Time: 01/11/22  4:55 AM  Result Value Ref Range   Total hemoglobin 10.4 (Jessica) 12.0 - 16.0 g/dL   O2 Saturation 64 %   Carboxyhemoglobin 1.3 0.5 - 1.5 %   Methemoglobin 0.9 0.0 - 1.5 %    Comment: Performed at [facility] [address], [place], [place] [address]  Basic metabolic panel     Status: Abnormal   Collection Time: 01/11/22  4:55 AM  Result Value Ref Range   Sodium 135 135 - 145 mmol/Jessica   Potassium 3.4 (Jessica) 3.5 - 5.1 mmol/Jessica   Chloride 110 98 - 111 mmol/Jessica   CO2 21 (Jessica) 22 - 32 mmol/Jessica   Glucose, Bld 87 70 - 99 mg/dL    Comment: Glucose reference range applies only to samples taken after fasting for at least 8 hours.   BUN <5 (Jessica) 6 - 20 mg/dL   Creatinine, Ser 0.59 0.44 - 1.00 mg/dL   Calcium 7.8 (Jessica) 8.9 - 10.3 mg/dL   GFR, Estimated >60 >60 mL/min    Comment: (NOTE) Calculated using the CKD-EPI Creatinine Equation (2021)    Anion gap 4 (Jessica) 5 - 15    Comment: Performed at [facility] [address], [place], [place] [address]  Lactic acid, plasma     Status: None   Collection Time: 01/11/22  9:15 AM  Result Value Ref Range   Lactic Acid, Venous 1.3 0.5 - 1.9 mmol/Jessica    Comment: Performed at [facility] [address] [place], [place] [address]  Lactic acid, plasma     Status: Abnormal   Collection Time: 01/11/22 11:08 AM  Result Value Ref Range   Lactic Acid, Venous 2.0 (HH) 0.5 - 1.9 mmol/Jessica    Comment: CRITICAL RESULT CALLED TO, READ BACK BY AND VERIFIED WITH C. MCKENNY RN @ 1243 01/11/22 BY SEKDAHL. Performed at [facility], [place] [address], [place], [place] [address]   Basic metabolic panel     Status: Abnormal   Collection Time: 01/12/22  5:31 AM  Result Value Ref Range   Sodium 134 (Jessica) 135 - 145 mmol/Jessica   Potassium 3.9 3.5 - 5.1 mmol/Jessica   Chloride 104 98 - 111 mmol/Jessica   CO2 21 (Jessica) 22 - 32 mmol/Jessica   Glucose, Bld 107 (H) 70 - 99 mg/dL    Comment: Glucose reference range applies only  to samples taken after fasting for at least 8 hours.   BUN <5 (Jessica) 6 - 20 mg/dL   Creatinine, Ser 0.60 0.44 - 1.00 mg/dL   Calcium 7.9 (Jessica) 8.9 - 10.3 mg/dL   GFR,  Estimated >60 >60 mL/min    Comment: (NOTE) Calculated using the CKD-EPI Creatinine Equation (2021)    Anion gap 9 5 - 15    Comment: Performed at [facility], [place] [address] [place], [place] [address]  CBC     Status: Abnormal   Collection Time: 01/12/22  5:31 AM  Result Value Ref Range   WBC 6.2 4.0 - 10.5 K/uL   RBC 3.71 (Jessica) 3.87 - 5.11 MIL/uL   Hemoglobin 10.1 (Jessica) 12.0 - 15.0 g/dL   HCT 32.0 (Jessica) 36.0 - 46.0 %   MCV 86.3 80.0 - 100.0 fL   MCH 27.2 26.0 - 34.0 pg   MCHC 31.6 30.0 - 36.0 g/dL   RDW 18.7 (H) 11.5 - 15.5 %   Platelets 272 150 - 400 K/uL   nRBC 0.0 0.0 - 0.2 %    Comment: Performed at [facility], [place] [address] [place], [place] [address]  Magnesium     Status: Abnormal   Collection Time: 01/12/22  5:31 AM  Result Value Ref Range   Magnesium 1.6 (Jessica) 1.7 - 2.4 mg/dL    Comment: Performed at [facility] [address], [place], [place] [address]  Hepatic function panel     Status: Abnormal   Collection Time: 01/12/22  5:31 AM  Result Value Ref Range   Total Protein 7.8 6.5 - 8.1 g/dL   Albumin 2.0 (Jessica) 3.5 - 5.0 g/dL   AST 54 (H) 15 - 41 U/Jessica   ALT 28 0 - 44 U/Jessica   Alkaline Phosphatase 69 38 - 126 U/Jessica   Total Bilirubin 0.6 0.3 - 1.2 mg/dL   Bilirubin, Direct <0.1 0.0 - 0.2 mg/dL   Indirect Bilirubin NOT CALCULATED 0.3 - 0.9 mg/dL    Comment: Performed at [facility] [address], [place], [place] [address]  Cooxemetry Panel (carboxy, met, total hgb, O2 sat)     Status: Abnormal   Collection Time: 01/12/22  5:35 AM  Result Value Ref Range   Total hemoglobin 10.9 (Jessica) 12.0 - 16.0 g/dL   O2 Saturation 60.6 %   Carboxyhemoglobin 1.1 0.5 - 1.5 %   Methemoglobin 1.2 0.0 - 1.5 %    Comment: Performed at [facility], [place] [address] [place], [place] [address]     Assessment/Plan: 21 year old female with severe pulmonary hypertension needing a tried agenic medication that requires 2 forms of birth control.  We discussed forms of birth control and decided that Jessica Frazier would be best.  Nexplanon can be placed while she is in the hospital.  She was consented for the placement and understands the risks of this medication.  She understands the biggest side effect is breakthrough bleeding.  Her second form of contraception will be condoms.  Patient needs a Pap smear and we can do this in the outpatient.  Jessica Frazier does have a history of liver dysfunction.  This was over a year ago when she was on written Volk.  Her LFTs normalized after she stopped this drug.  In an abundance of caution, we will check her LFTs in a month.  Nexplanon 1 placed tomorrow.  Appropriate materials were not at bedside currently.  Silas [place] 01/12/2022

## 2022-01-12 NOTE — Progress Notes (Addendum)
PROGRESS NOTE    Jessica Frazier Jessica Frazier  GSU:[id number] DOB: 01-24-2001 DOA: 01/08/2022 PCP: [name], NP     Brief Narrative:  Patient is a 21 years old female with past medical history of SLE, seronegative rheumatoid arthritis, pulmonary hypertension/RV failure, recent miscarriage, presented to the hospital with left-sided chest pain, shortness of breath and cough.  In the ED, patient was noted to be febrile, tachycardic with tachypnea.  Initial labs showed a sodium of 132, BNP 125.  Troponin was mildly elevated at 32-42.  Patient was then admitted hospital for further evaluation and treatment.  Cardiology on board for further management.  Assessment & Plan:  Principal Problem:   Chest pain Active Problems:   Elevated troponin   SIRS (systemic inflammatory response syndrome) (HCC)   CAP (community acquired pneumonia)   Dyspnea   Diastolic dysfunction   Hyponatremia   Systemic lupus erythematosus (HCC)   Normocytic anemia   Acquired hypothyroidism   Diarrhea  Chest pain.  Possible pneumonia Patient initially presented with pleuritic chest pain and shortness of breath and fever of 100.55F.  CTA chest without PE but some effusion and decreased lung volume with increased interstitial and peribronchial opacity.  Procalcitonin was negative.  No leukocytosis.  Respiratory viral panel was negative.  Blood cultures negative so far in 3 days..  Strep urinary antigen and Legionella urinary antigen negative.  On Rocephin and Zithromax.  We will continue antibiotic to complete 5-day  course.  Clinically has improved without any fever or shortness of breath or sputum production.  Off has improved.  Pulmonary hypertension/RV failure/sinus tachycardia/lactic acidosis Heart failure team on board.    Likely pulmonary hypertension from SLE.  Continue digoxin, diuretics.  Macitentan, milrinone and tadalafil.  Off beta-blocker.  2D echocardiogram from 12/18/2021 showed LV ejection  fraction of 55 to 60% with no regional wall motion abnormality but right ventricular systolic function is reduced with severely elevated pulmonary artery pressure at 124 mmHg.  Follow cardiology recommendations.  Patient did have some chest pain yesterday.  Still on milrinone drip and being titrating down.  Hypokalemia.  Improved after replacement.  Potassium of 3.9 today.  Mild hypomagnesemia.  We will replenish.  Mild hyponatremia Improved.  Sodium of 134 today.  Continue to monitor BMP.  SLE /RA /immunosuppressed status  Continue to hold belimumab -receives subQ q_0 0 mg Oral Daily 01/08/22 0729 01/13/22 0759       Subjective:  Today, patient was seen and examined at bedside.  Denies any orthopnea or PND.  Feels better.  Had some chest discomfort yesterday.  Improving  cough.  No overt dyspnea.   Objective: Vitals:   01/11/22 1936 01/11/22 2322 01/12/22 0237 01/12/22 0719  BP: 101/72 102/69 115/71 104/68  Pulse: (!) 120 (!) 118 (!) 130   Resp: _0 Temp: 98.8 F (37.1 C) 98.9 F (37.2 C) 98 F (36.7 C) 98 F (36.7 C)  TempSrc: Oral Oral Oral Oral  SpO2: 97% 96% 97% 97%  Weight:   53.1 kg   Height:        Intake/Output Summary  (Last 24 hours) at 01/12/2022 0750 Last data filed at 01/11/2022 1938 Gross per 24 hour  Intake 60 ml  Output 0 ml  Net 60 ml     Filed Weights   01/10/22 1614 01/11/22 0358 01/12/22 0237  Weight: 55.4 kg 55.6 kg 53.1 kg    Examination: Body mass index is 21.41 kg/m.   General:  Average built, not in obvious distress HENT:   No scleral pallor or icterus noted. Oral mucosa is moist.   Chest:  Clear breath sounds.  Diminished breath sounds bilaterally. No crackles or wheezes.  CVS: S1 &S2 heard.  Systolic murmur noted, regular rate and rhythm.  Tachycardia noted. Abdomen: Soft, nontender, nondistended.  Bowel sounds are heard.   Extremities: No cyanosis, clubbing or edema.  Peripheral pulses are palpable. Psych: Alert, awake and oriented, normal mood CNS:  No cranial nerve deficits.  Power equal in all extremities.   Skin: Warm and dry.  No rashes noted.   Data Reviewed: I personally reviewed the labs and imaging studies.     Scheduled Meds:  azithromycin  500 mg Oral Daily   Chlorhexidine Gluconate Cloth  6 each Topical Daily   digoxin  0.125 mg Oral Daily   enoxaparin (LOVENOX) injection  40 mg Subcutaneous Q24H   guaiFENesin  1,200 mg Oral BID   levothyroxine  150 mcg Oral QAC breakfast   sodium chloride flush  10-40 mL Intracatheter Q12H   sodium chloride flush  3 mL Intravenous Q12H   tadalafil  40 mg Oral Daily   Continuous Infusions:  cefTRIAXone (ROCEPHIN)  IV 2 g (01/11/22 0911)   milrinone 0.25 mcg/kg/min (01/11/22 1213)     LOS: 3 days    Flora Lipps, MD  [facility] 01/12/2022, 7:50 AM

## 2022-01-12 NOTE — Plan of Care (Signed)
  Problem: Activity: Goal: Ability to tolerate increased activity will improve Outcome: Progressing   Problem: Education: Goal: Knowledge of General Education information will improve Description: Including pain rating scale, medication(s)/side effects and non-pharmacologic comfort measures Outcome: Progressing   Problem: Clinical Measurements: Goal: Cardiovascular complication will be avoided Outcome: Progressing

## 2022-01-12 NOTE — Progress Notes (Signed)
Mobility Specialist Progress Note    01/12/22 1651  Mobility  Activity Ambulated independently in hallway  Level of Assistance Standby assist, set-up cues, supervision of patient - no hands on  Assistive Device None  Distance Ambulated (ft) 360 ft  Activity Response Tolerated well  $Mobility charge 1 Mobility   Pre-Mobility: 109 HR, 95/66 (77) BP During Mobility: 126 HR Post-Mobility: 122 HR, 115/80 (92) BP  Pt received in bed and agreeable. No complaints on walk. Returned to sitting EOB with call bell in reach.    Hildred Alamin Mobility Specialist  Secure Chat Only

## 2022-01-12 NOTE — Progress Notes (Addendum)
Advanced Heart Failure Rounding Note  PCP-Cardiologist: None   Subjective:    Started on milrinone 10/28 d/t co-ox 46% and lactic acidosis.   Co-ox 61% Lactic acid 2   CVP 5 after '40mg'$  IV lasix yesterday  Remains tachycardic.  Feeling fine, cough improving. No fevers or chills. No orthopnea or PND.   Objective:   Weight Range: 53.1 kg Body mass index is 21.41 kg/m.   Vital Signs:   Temp:  [97.9 F (36.6 C)-98.9 F (37.2 C)] 98 F (36.7 C) (10/31 0719) Pulse Rate:  [112-130] 130 (10/31 0237) Resp:  [18] 18 (10/31 0237) BP: (101-115)/(64-80) 104/68 (10/31 0719) SpO2:  [94 %-97 %] 97 % (10/31 0719) Weight:  [53.1 kg] 53.1 kg (10/31 0237) Last BM Date : 01/09/22  Weight change: Filed Weights   01/10/22 1614 01/11/22 0358 01/12/22 0237  Weight: 55.4 kg 55.6 kg 53.1 kg    Intake/Output:   Intake/Output Summary (Last 24 hours) at 01/12/2022 0749 Last data filed at 01/11/2022 1938 Gross per 24 hour  Intake 60 ml  Output 0 ml  Net 60 ml     Physical Exam  CVP 5 General:  weak appearing.  No respiratory difficulty HEENT: normal Neck: supple. No JVD. Carotids 2+ bilat; no bruits. No lymphadenopathy or thyromegaly appreciated. Cor: PMI nondisplaced. Regular tachy,  + RV lift 2/6 TR Lungs: clear Abdomen: soft, nontender, nondistended. No hepatosplenomegaly. No bruits or masses. Good bowel sounds. Extremities: no cyanosis, clubbing, rash, edema  Neuro: alert & oriented x 3, cranial nerves grossly intact. moves all 4 extremities w/o difficulty. Affect pleasant.  Telemetry   Sinus tach 100s (Personally reviewed)    Labs    CBC Recent Labs    01/10/22 0338 01/12/22 0531  WBC 5.2 6.2  NEUTROABS 3.3  --   HGB 9.7* 10.1*  HCT 30.9* 32.0*  MCV 86.6 86.3  PLT 303 505   Basic Metabolic Panel Recent Labs    01/10/22 0338 01/11/22 0455 01/12/22 0531  NA 135 135 134*  K 3.5 3.4* 3.9  CL 110 110 104  CO2 22 21* 21*  GLUCOSE 90 87 107*  BUN 5* <5*  <5*  CREATININE 0.61 0.59 0.60  CALCIUM 7.7* 7.8* 7.9*  MG 1.8  --  1.6*  PHOS 3.4  --   --    Liver Function Tests Recent Labs    01/10/22 0338  AST 53*  ALT 26  ALKPHOS 63  BILITOT 0.6  PROT 7.2  ALBUMIN 1.9*   No results for input(s): "LIPASE", "AMYLASE" in the last 72 hours. Cardiac Enzymes No results for input(s): "CKTOTAL", "CKMB", "CKMBINDEX", "TROPONINI" in the last 72 hours.  BNP: BNP (last 3 results) Recent Labs    11/18/21 1534 01/08/22 0225  BNP 100.5* 125.4*    ProBNP (last 3 results) No results for input(s): "PROBNP" in the last 8760 hours.   D-Dimer No results for input(s): "DDIMER" in the last 72 hours. Hemoglobin A1C No results for input(s): "HGBA1C" in the last 72 hours. Fasting Lipid Panel No results for input(s): "CHOL", "HDL", "LDLCALC", "TRIG", "CHOLHDL", "LDLDIRECT" in the last 72 hours. Thyroid Function Tests No results for input(s): "TSH", "T4TOTAL", "T3FREE", "THYROIDAB" in the last 72 hours.  Invalid input(s): "FREET3"   Other results:   Imaging    No results found.   Medications:     Scheduled Medications:  azithromycin  500 mg Oral Daily   Chlorhexidine Gluconate Cloth  6 each Topical Daily   digoxin  0.125 mg Oral Daily   enoxaparin (LOVENOX) injection  40 mg Subcutaneous Q24H   guaiFENesin  1,200 mg Oral BID   levothyroxine  150 mcg Oral QAC breakfast   sodium chloride flush  10-40 mL Intracatheter Q12H   sodium chloride flush  3 mL Intravenous Q12H   tadalafil  40 mg Oral Daily    Infusions:  cefTRIAXone (ROCEPHIN)  IV 2 g (01/11/22 0911)   milrinone 0.25 mcg/kg/min (01/11/22 1213)    PRN Medications: acetaminophen **OR** acetaminophen, albuterol, sodium chloride flush   Assessment/Plan   1. 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 - O2 sats table on RA, 99%  - Continue tadalafil 40 mg daily  - Continue digoxin. B-blocker stopped.  - Opsumit 10 mg daily was supposed to be restarted. Open to starting birth control but would like to talk to her OBGYN/PCP first, unable to start opsumit without 2 forms of birth control started.  ERA not ordered. Encouraged to reconsider starting while inpatient, "will think about it" - PICC placed 10/28 initial co-ox 46% c/w low output RV failure. Now on milrinone 0.25. Co-ox 61% today. lactic acid still elevated.  - received lasix x1 yesterday, CVP 5  - Will need to try to get on combination therapy with PDE-5 and ERA (pending birth control) and try to wean milrinone and get RHC off milrinone to see if she will need to be considered for IV therapies.   2. Acute hypoxic respiratory failure with infiltrates on CT - management per IM - azithromycin + ceftriaxone - COVID negative - Respiratory panel pending  - ? PNA versus lupus flare. SLE flare less likely without hematuria or joint symptoms    3. SLE - On belizumab.  Followed by Dr. Lenna Gilford.   4. Recent Pregnancy  - s/p recent miscarriage at [redacted] wks gestation, confirmed by OB/GYN  - ? hCG < 5.0.  - needs to avoid future pregnancies in setting of severe PAH and possible Optsumit use  - stressed need for effective contraception    5. Hypothyroidism - on Levothyroxine   Length of Stay: [facility] AGACNP-BC  01/12/2022, 7:49 AM  Advanced Heart Failure Team Pager [phone] (M-F; 7a - 5p)  Please contact [facility] for night-coverage after hours (5p -7a ) and weekends on [email or web address]  Patient seen with NP, agree with the above note.    Feels better today after diuresis yesterday.  I/Os not recorded but weight is down, CVP 6-7.  Creatinine stable at 0.6, co-ox 61% on milrinone 0.25.   General: NAD Neck:  No JVD, no thyromegaly or thyroid nodule.  Lungs: Clear to auscultation bilaterally with normal respiratory effort. CV: Nondisplaced PMI.  Heart regular S1/S2 with loud P2, no S3/S4, no murmur.  No peripheral edema.   Abdomen: Soft, nontender, no hepatosplenomegaly, no distention.  Skin: Intact without lesions or rashes.  Neurologic: Alert and oriented x 3.  Psych: Normal affect. Extremities: No clubbing or cyanosis.  HEENT: Normal.   1. Pulmonary hypertension with cor pulmonale: Patient had severe PAH by 7/23 RHC.  Echo in 2023 showed mildly dilated RV with mild RV systolic dysfunction, PASP was elevated at 86  mmHg.  V/Q scan did not suggest the presence of chronic PEs.  HIV negative.  No known liver disease.  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 Echo in 10/23 showed EF 55-60%, D-shaped septum, moderate RV enlargement with moderate RV dysfunction, PASP 124 mmHg.  I think she is going to need aggressive treatment of her PH via pulmonary vasodilators.  She got pregnant recently but had miscarriage, this has limited treatment to tadalafil.  This admission, low output by co-ox and started on milrinone 0.25.  Today, co-ox 61% with CVP 6-7.   - Continue tadalafil 40 mg daily.  - She is willing to start contraception now.  Will try to start OCPs today and will start Opsumit 10 mg daily today.   - We will need to try to get her selexipag soon => working on initiation.   - Volume status improved, start Lasix 20 mg po daily.  - Decrease milrinone to 0.125, will repeat RHC when off milrinone to reassess PA pressure and cardiac output to see if we need to consider her sooner for IV PH therapy.  - Continue digoxin 0.125, will need to check a level soon.  - She will eventually need a sleep study.  2. SLE: On belimumab.  Followed by Dr. Lenna Gilford. Symptoms not typical of SLE flare, CRP only mildly elevated.  3. ?PNA: She is on ceftrixaone/azithromycin but PCT < 0.1 and viral panel negative.  WBCs not  elevated.  ?If presentation was due to cor pulmonale in setting of fluid shifts while she was pregnant (has now miscarried).  4. DVT prophylaxis: Lovenox.   Jessica Frazier 01/12/2022 10:31 AM

## 2022-01-13 DIAGNOSIS — R0789 Other chest pain: Secondary | ICD-10-CM | POA: Diagnosis not present

## 2022-01-13 DIAGNOSIS — J189 Pneumonia, unspecified organism: Secondary | ICD-10-CM | POA: Diagnosis not present

## 2022-01-13 DIAGNOSIS — Z3009 Encounter for other general counseling and advice on contraception: Secondary | ICD-10-CM

## 2022-01-13 DIAGNOSIS — R7989 Other specified abnormal findings of blood chemistry: Secondary | ICD-10-CM | POA: Diagnosis not present

## 2022-01-13 DIAGNOSIS — I272 Pulmonary hypertension, unspecified: Secondary | ICD-10-CM | POA: Diagnosis not present

## 2022-01-13 LAB — BASIC METABOLIC PANEL
Anion gap: 10 (ref 5–15)
BUN: <5 mg/dL — ABNORMAL LOW (ref 6–20)
CO2: 20 mmol/L — ABNORMAL LOW (ref 22–32)
Calcium: 7.8 mg/dL — ABNORMAL LOW (ref 8.9–10.3)
Chloride: 105 mmol/L (ref 98–111)
Creatinine, Ser: 0.62 mg/dL (ref 0.44–1.00)
GFR, Estimated: >60 mL/min (ref >60)
Glucose, Bld: 90 mg/dL (ref 70–99)
Potassium: 3.8 mmol/L (ref 3.5–5.1)
Sodium: 135 mmol/L (ref 135–145)

## 2022-01-13 LAB — CBC
HCT: 33 % — ABNORMAL LOW (ref 36.0–46.0)
Hemoglobin: 10.1 g/dL — ABNORMAL LOW (ref 12.0–15.0)
MCH: 26.6 pg (ref 26.0–34.0)
MCHC: 30.6 g/dL (ref 30.0–36.0)
MCV: 87.1 fL (ref 80.0–100.0)
Platelets: 309 10*3/uL (ref 150–400)
RBC: 3.79 MIL/uL — ABNORMAL LOW (ref 3.87–5.11)
RDW: 19.1 % — ABNORMAL HIGH (ref 11.5–15.5)
WBC: 6.5 10*3/uL (ref 4.0–10.5)
nRBC: 0 % (ref 0.0–0.2)

## 2022-01-13 LAB — COOXEMETRY PANEL
Carboxyhemoglobin: 1.8 % — ABNORMAL HIGH (ref 0.5–1.5)
Methemoglobin: 0.7 % (ref 0.0–1.5)
O2 Saturation: 61 %
Total hemoglobin: 10.3 g/dL — ABNORMAL LOW (ref 12.0–16.0)

## 2022-01-13 LAB — MAGNESIUM: Magnesium: 1.6 mg/dL — ABNORMAL LOW (ref 1.7–2.4)

## 2022-01-13 MED ORDER — SODIUM CHLORIDE 0.9 % IV SOLN: 250.0000 mL | INTRAVENOUS | Status: DC | PRN

## 2022-01-13 MED ORDER — POTASSIUM CHLORIDE CRYS ER 20 MEQ PO TBCR
20.0000 meq | EXTENDED_RELEASE_TABLET | Freq: Once | ORAL | Status: AC
  Administered 2022-01-13: 20 meq via ORAL
  Filled 2022-01-13: qty 1

## 2022-01-13 MED ORDER — ASPIRIN 81 MG PO CHEW
81.0000 mg | CHEWABLE_TABLET | ORAL | Status: AC
  Administered 2022-01-14: 81 mg via ORAL
  Filled 2022-01-13: qty 1

## 2022-01-13 MED ORDER — SODIUM CHLORIDE 0.9% FLUSH: 3.0000 mL | INTRAVENOUS | Status: DC | PRN

## 2022-01-13 MED ORDER — SODIUM CHLORIDE 0.9 % IV SOLN: INTRAVENOUS | Status: DC

## 2022-01-13 MED ORDER — MAGNESIUM SULFATE 2 GM/50ML IV SOLN
2.0000 g | Freq: Once | INTRAVENOUS | Status: AC
  Administered 2022-01-13: 2 g via INTRAVENOUS
  Filled 2022-01-13: qty 50

## 2022-01-13 MED ORDER — SODIUM CHLORIDE 0.9% FLUSH
3.0000 mL | Freq: Two times a day (BID) | INTRAVENOUS | Status: DC
  Administered 2022-01-13 – 2022-01-14 (×2): 3 mL via INTRAVENOUS

## 2022-01-13 NOTE — Progress Notes (Signed)
Patient ID: Jessica Frazier, female   DOB: [birth year], 21 y.o.   MRN: [id number]     Advanced Heart Failure Rounding Note  PCP-Cardiologist: None   Subjective:    Started on milrinone 10/28 d/t co-ox 46% and lactic acidosis.   Co-ox 61% today on milrinone 0.125.   CVP 2 on po Lasix.   Remains tachycardic rate 110s-120s.   Mild dyspnea if she walks longer distances, cough improving. No fevers or chills. No orthopnea or PND.   Objective:   Weight Range: 52.9 kg Body mass index is 21.33 kg/m.   Vital Signs:   Temp:  [98 F (36.7 C)-99.9 F (37.7 C)] 99.4 F (37.4 C) (11/01 0725) Pulse Rate:  [110-125] 125 (11/01 0410) Resp:  [14-22] 14 (11/01 0725) BP: (105-120)/(63-80) 114/64 (11/01 0410) SpO2:  [97 %-98 %] 97 % (11/01 0725) Weight:  [52.9 kg] 52.9 kg (11/01 0410) Last BM Date : 01/09/22  Weight change: Filed Weights   01/11/22 0358 01/12/22 0237 01/13/22 0410  Weight: 55.6 kg 53.1 kg 52.9 kg    Intake/Output:   Intake/Output Summary (Last 24 hours) at 01/13/2022 0830 Last data filed at 01/13/2022 0800 Gross per 24 hour  Intake 814.92 ml  Output 600 ml  Net 214.92 ml     Physical Exam  CVP 2 General: NAD Neck: No JVD, no thyromegaly or thyroid nodule.  Lungs: Clear to auscultation bilaterally with normal respiratory effort. CV: RV heave.  Heart regular S1/S2 with loud P2, no S3/S4, no murmur.  No peripheral edema.   Abdomen: Soft, nontender, no hepatosplenomegaly, no distention.  Skin: Intact without lesions or rashes.  Neurologic: Alert and oriented x 3.  Psych: Normal affect. Extremities: No clubbing or cyanosis.  HEENT: Normal.   Telemetry   Sinus tach 110s-120s (Personally reviewed)    Labs    CBC Recent Labs    01/12/22 0531 01/13/22 0420  WBC 6.2 6.5  HGB 10.1* 10.1*  HCT 32.0* 33.0*  MCV 86.3 87.1  PLT 272 425   Basic Metabolic Panel Recent Labs    01/12/22 0531 01/13/22 0420  NA 134* 135  K 3.9 3.8  CL 104  105  CO2 21* 20*  GLUCOSE 107* 90  BUN <5* <5*  CREATININE 0.60 0.62  CALCIUM 7.9* 7.8*  MG 1.6* 1.6*   Liver Function Tests Recent Labs    01/12/22 0531  AST 54*  ALT 28  ALKPHOS 69  BILITOT 0.6  PROT 7.8  ALBUMIN 2.0*   No results for input(s): "LIPASE", "AMYLASE" in the last 72 hours. Cardiac Enzymes No results for input(s): "CKTOTAL", "CKMB", "CKMBINDEX", "TROPONINI" in the last 72 hours.  BNP: BNP (last 3 results) Recent Labs    11/18/21 1534 01/08/22 0225  BNP 100.5* 125.4*    ProBNP (last 3 results) No results for input(s): "PROBNP" in the last 8760 hours.   D-Dimer No results for input(s): "DDIMER" in the last 72 hours. Hemoglobin A1C No results for input(s): "HGBA1C" in the last 72 hours. Fasting Lipid Panel No results for input(s): "CHOL", "HDL", "LDLCALC", "TRIG", "CHOLHDL", "LDLDIRECT" in the last 72 hours. Thyroid Function Tests No results for input(s): "TSH", "T4TOTAL", "T3FREE", "THYROIDAB" in the last 72 hours.  Invalid input(s): "FREET3"   Other results:   Imaging    No results found.   Medications:     Scheduled Medications:  Chlorhexidine Gluconate Cloth  6 each Topical Daily   digoxin  0.125 mg Oral Daily   enoxaparin (LOVENOX) injection  40 mg Subcutaneous Q24H   etonogestrel  68 mg Subdermal Once   furosemide  20 mg Oral Daily   guaiFENesin  1,200 mg Oral BID   levothyroxine  150 mcg Oral QAC breakfast   macitentan  10 mg Oral Daily   magnesium oxide  400 mg Oral BID   sodium chloride flush  10-40 mL Intracatheter Q12H   sodium chloride flush  3 mL Intravenous Q12H   sodium chloride flush  3 mL Intravenous Q12H   tadalafil  40 mg Oral Daily    Infusions:  magnesium sulfate bolus IVPB      PRN Medications: acetaminophen **OR** acetaminophen, albuterol, lidocaine, sodium chloride flush   Assessment/Plan   1. Pulmonary hypertension with cor pulmonale: Patient had severe PAH by 7/23 RHC.  Echo in 2023 showed  mildly dilated RV with mild RV systolic dysfunction, PASP was elevated at 86 mmHg.  V/Q scan did not suggest the presence of chronic PEs.  HIV negative.  No known liver disease.  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 Echo in 10/23 showed EF 55-60%, D-shaped septum, moderate RV enlargement with moderate RV dysfunction, PASP 124 mmHg.  I think she is going to need aggressive treatment of her PH via pulmonary vasodilators.  She got pregnant recently but had miscarriage, this has limited treatment to tadalafil.  This admission, low output by co-ox and started on milrinone 0.25.  Today, co-ox 61% with CVP down to 2 on milrinone 0.125. - Stop milrinone today.  - She will be getting contraceptive shot today from OB/gyn.  She will then be able to start Opsumit 10 mg daily today.    - Continue tadalafil 40 mg daily.  - We will need to try to get her selexipag soon => working on initiation.   - Continue Lasix 20 mg po daily.  - Continue digoxin 0.125, will need to check a level tomorrow.  - RHC tomorrow to reassess filling pressures and PA pressure. Discussed risks/benefits with patient and she agrees to procedure.  - She will eventually need a sleep study.  2. SLE: On belimumab.  Followed by Dr. Lenna Gilford. Symptoms not typical of SLE flare, CRP only mildly elevated.  3. ?PNA: She has been on ceftriaxone/azithromycin but PCT < 0.1 and viral panel negative.  WBCs not elevated.  ?If presentation was due to cor pulmonale in setting of fluid shifts while she was pregnant (has now miscarried).  4. DVT prophylaxis: Lovenox.   Loralie Champagne 01/13/2022 8:30 AM

## 2022-01-13 NOTE — Progress Notes (Addendum)
PROGRESS NOTE    Jessica Frazier  MRN:[id number] DOB: 06/28/2000 DOA: 01/08/2022 PCP: Hufnagel, Christine, NP    Brief Narrative:   Patient is a 21 years old female with past medical history of SLE, seronegative rheumatoid arthritis, pulmonary hypertension/RV failure, recent miscarriage, presented to the hospital with left-sided chest pain, shortness of breath and cough.  In the ED, patient was noted to be febrile, tachycardic with tachypnea.  Initial labs showed a sodium of 132, BNP 125.  Troponin was mildly elevated at 32-42.  Patient was then admitted hospital for further evaluation and treatment.  Cardiology on board for further management.  Patient is currently on milrinone drip.  Assessment & Plan:  Principal Problem:   Chest pain Active Problems:   Elevated troponin   SIRS (systemic inflammatory response syndrome) (HCC)   CAP (community acquired pneumonia)   Dyspnea   Diastolic dysfunction   Hyponatremia   Systemic lupus erythematosus (HCC)   Normocytic anemia   Acquired hypothyroidism   Diarrhea  Chest pain, possible pneumonia Patient initially presented with pleuritic chest pain and shortness of breath and fever of 100.5F.  CTA chest without PE but some effusion and decreased lung volume with increased interstitial and peribronchial opacity.  Procalcitonin was negative.  No leukocytosis.  Respiratory viral panel was negative.  Blood cultures negative so far in 4 days.  Strep urinary antigen and Legionella urinary antigen negative.  On Rocephin and Zithromax.  We will continue antibiotic to complete 5-day  course.  Clinically has improved   Pulmonary hypertension/RV failure/sinus tachycardia/lactic acidosis Heart failure team on board.    Likely pulmonary hypertension from SLE.  Continue digoxin, diuretics, Macitentan, milrinone and tadalafil.  Off beta-blocker.  2D echocardiogram from 12/18/2021 showed LV ejection fraction of 55 to 60% with no regional wall  motion abnormality but right ventricular systolic function is reduced with severely elevated pulmonary artery pressure at 124 mmHg.  on milrinone drip and being titrating down.  Plan is to discontinue milrinone drip today.  Plan to check digoxin level tomorrow.  Patient stating that cardiology is thinking about right-sided heart catheterization.  Recent miscarriage..  OB/GYN consulted.  Patient will be started on contraceptive injection so that she can continue Opsumit daily.  Hypokalemia.  Replace as needed.  Potassium today is 3.8.  We will get 1 dose of 20 milliequivalents of of potassium today.  Mild hypomagnesemia.  Magnesium oxide.  We will also give 2 g of magnesium sulfate.  Check levels in AM.  Mild hyponatremia Improved.  Sodium of 135 today.  Continue to monitor BMP.  SLE /RA /immunosuppressed status  Continue to hold belimumab -receives subQ qsunday) due to concerning for infection.  Has mildly elevated ESR CRP.  Follows up with Dr. Hawks as outpatient.  Patient states that this is not a typical flare.  Hypothyroidism Continue synthroid  Pressure injury, ruled out.   DVT prophylaxis: enoxaparin (LOVENOX) injection 40 mg Start: 01/08/22 1000      Code Status: Full Code  Family Communication:  None at bedside.  Disposition: Home Dispo: The patient is from: home                  Anticipated d/c date is: Uncertain at this time.  As per cardiology recommendation.  Antimicrobials:    Anti-infectives (From admission, onward)    Start     Dose/Rate Route Frequency Ordered Stop   01/08/22 0800  cefTRIAXone (ROCEPHIN) 2 g in sodium chloride 0.9 % 100 mL IVPB          2 g 200 mL/hr over 30 Minutes Intravenous Every 24 hours 01/08/22 0729 01/12/22 1007   01/08/22 0800  azithromycin (ZITHROMAX) tablet 500 mg        500 mg Oral Daily 01/08/22 0729 01/12/22 0932       Subjective:  Today, patient was seen and examined at bedside.  Patient complains of dyspnea on exertion.   Denies any chest pain, shortness of breath at rest, fever, chills or rigor.  Objective: Vitals:   01/13/22 0839 01/13/22 0906 01/13/22 1103 01/13/22 1200  BP: 111/76  118/78   Pulse: (!) 117 (!) 129 (!) 109   Resp:    14  Temp: 98.1 F (36.7 C)  98.6 F (37 C)   TempSrc: Oral  Oral   SpO2:    97%  Weight:      Height:        Intake/Output Summary (Last 24 hours) at 01/13/2022 1411 Last data filed at 01/13/2022 1200 Gross per 24 hour  Intake 784.92 ml  Output 600 ml  Net 184.92 ml    Filed Weights   01/11/22 0358 01/12/22 0237 01/13/22 0410  Weight: 55.6 kg 53.1 kg 52.9 kg    Examination: Body mass index is 21.33 kg/m.   General:  Average built, not in obvious distress HENT:   No scleral pallor or icterus noted. Oral mucosa is moist.  Chest:  Clear breath sounds.  . No crackles or wheezes.  CVS: S1 &S2 heard.  Systolic murmur noted, tachycardia regular rate and rhythm. Abdomen: Soft, nontender, nondistended.  Bowel sounds are heard.   Extremities: No cyanosis, clubbing or edema.  Peripheral pulses are palpable. Psych: Alert, awake and oriented, normal mood CNS:  No cranial nerve deficits.  Power equal in all extremities.   Skin: Warm and dry.  No rashes noted.    Data Reviewed: I personally reviewed the labs and imaging studies.     Scheduled Meds:  Chlorhexidine Gluconate Cloth  6 each Topical Daily   digoxin  0.125 mg Oral Daily   enoxaparin (LOVENOX) injection  40 mg Subcutaneous Q24H   furosemide  20 mg Oral Daily   guaiFENesin  1,200 mg Oral BID   levothyroxine  150 mcg Oral QAC breakfast   macitentan  10 mg Oral Daily   magnesium oxide  400 mg Oral BID   sodium chloride flush  10-40 mL Intracatheter Q12H   sodium chloride flush  3 mL Intravenous Q12H   sodium chloride flush  3 mL Intravenous Q12H   tadalafil  40 mg Oral Daily   Continuous Infusions:     LOS: 4 days    Flora Lipps, MD  [facility] 01/13/2022, 2:11 PM

## 2022-01-13 NOTE — Plan of Care (Signed)
  Problem: Activity: Goal: Ability to tolerate increased activity will improve Outcome: Progressing   Problem: Clinical Measurements: Goal: Ability to maintain a body temperature in the normal range will improve Outcome: Progressing   Problem: Respiratory: Goal: Ability to maintain adequate ventilation will improve Outcome: Progressing Goal: Ability to maintain a clear airway will improve Outcome: Progressing   Problem: Education: Goal: Knowledge of General Education information will improve Description: Including pain rating scale, medication(s)/side effects and non-pharmacologic comfort measures Outcome: Progressing   Problem: Health Behavior/Discharge Planning: Goal: Ability to manage health-related needs will improve Outcome: Progressing   Problem: Clinical Measurements: Goal: Ability to maintain clinical measurements within normal limits will improve Outcome: Progressing Goal: Will remain free from infection Outcome: Progressing Goal: Diagnostic test results will improve Outcome: Progressing Goal: Respiratory complications will improve Outcome: Progressing Goal: Cardiovascular complication will be avoided Outcome: Progressing   Problem: Activity: Goal: Risk for activity intolerance will decrease Outcome: Progressing   Problem: Nutrition: Goal: Adequate nutrition will be maintained Outcome: Progressing   Problem: Coping: Goal: Level of anxiety will decrease Outcome: Progressing   Problem: Elimination: Goal: Will not experience complications related to bowel motility Outcome: Progressing Goal: Will not experience complications related to urinary retention Outcome: Progressing   Problem: Pain Managment: Goal: General experience of comfort will improve Outcome: Progressing   Problem: Safety: Goal: Ability to remain free from injury will improve Outcome: Progressing   Problem: Skin Integrity: Goal: Risk for impaired skin integrity will decrease Outcome:  Progressing   Problem: Education: Goal: Understanding of CV disease, CV risk reduction, and recovery process will improve Outcome: Progressing Goal: Individualized Educational Video(s) Outcome: Progressing   Problem: Activity: Goal: Ability to return to baseline activity level will improve Outcome: Progressing   Problem: Cardiovascular: Goal: Ability to achieve and maintain adequate cardiovascular perfusion will improve Outcome: Progressing Goal: Vascular access site(s) Level 0-1 will be maintained Outcome: Progressing   Problem: Health Behavior/Discharge Planning: Goal: Ability to safely manage health-related needs after discharge will improve Outcome: Progressing

## 2022-01-13 NOTE — Progress Notes (Signed)
Birth control implant done by MD at bedside tolerated well.

## 2022-01-13 NOTE — Progress Notes (Addendum)
Pharmacy Consult for Pulmonary Hypertension Treatment   Indication - Initiation of PAH medication while hospitalized  Patient is 21 y.o. and MD asked to newly started on Macitentan (Opsumit).   In order to use this medication as an inpatient, monitoring parameters per REMS requirements must be met.  Chronic therapy will under the supervision of Dr. Aundra Dubin who is enrolled in the REMS program and is initiating therapy.  On admission pregnancy risk has been assessed and pregnancy test dated 01/08/22 was negative. Nexplanon implanted on 11/1 so will be able to proceed.  Hepatic function has been evaluated and stable.       Latest Ref Rng & Units 01/12/2022    5:31 AM 01/10/2022    3:38 AM 01/08/2022    6:22 PM  Hepatic Function  Total Protein 6.5 - 8.1 g/dL 7.8  7.2  7.8   Albumin 3.5 - 5.0 g/dL 2.0  1.9  2.1   AST 15 - 41 U/L 54  53  67   ALT 0 - 44 U/L _0 Alk Phosphatase 38 - 126 U/L 69  63  65   Total Bilirubin 0.3 - 1.2 mg/dL 0.6  0.6  0.8   Bilirubin, Direct 0.0 - 0.2 mg/dL <0.1   0.3      The patient has been educated on Pregnancy Risk and Hepatotoxicity   -nexplanon implanted on 11/1.   If any question arise or pregnancy is identified during hospitalization, contact for bosentan: [phone]; macitentan: [phone]; ambrisentan: [phone].  Antonietta Jewel, PharmD, [place] Clinical Pharmacist  Phone: [phone] 01/13/2022 8:32 AM  Please check AMION for all [facility] phone numbers After 10:00 PM, call [place] [phone]

## 2022-01-13 NOTE — Progress Notes (Signed)
Coughed out thick frothy sputum in mod amount. Due meds given. Continue to monitor.

## 2022-01-13 NOTE — H&P (View-Only) (Signed)
Patient ID: Jessica Frazier, female   DOB: [birth year], 21 y.o.   MRN: [id number]     Advanced Heart Failure Rounding Note  PCP-Cardiologist: None   Subjective:    Started on milrinone 10/28 d/t co-ox 46% and lactic acidosis.   Co-ox 61% today on milrinone 0.125.   CVP 2 on po Lasix.   Remains tachycardic rate 110s-120s.   Mild dyspnea if she walks longer distances, cough improving. No fevers or chills. No orthopnea or PND.   Objective:   Weight Range: 52.9 kg Body mass index is 21.33 kg/m.   Vital Signs:   Temp:  [98 F (36.7 C)-99.9 F (37.7 C)] 99.4 F (37.4 C) (11/01 0725) Pulse Rate:  [110-125] 125 (11/01 0410) Resp:  [14-22] 14 (11/01 0725) BP: (105-120)/(63-80) 114/64 (11/01 0410) SpO2:  [97 %-98 %] 97 % (11/01 0725) Weight:  [52.9 kg] 52.9 kg (11/01 0410) Last BM Date : 01/09/22  Weight change: Filed Weights   01/11/22 0358 01/12/22 0237 01/13/22 0410  Weight: 55.6 kg 53.1 kg 52.9 kg    Intake/Output:   Intake/Output Summary (Last 24 hours) at 01/13/2022 0830 Last data filed at 01/13/2022 0800 Gross per 24 hour  Intake 814.92 ml  Output 600 ml  Net 214.92 ml     Physical Exam  CVP 2 General: NAD Neck: No JVD, no thyromegaly or thyroid nodule.  Lungs: Clear to auscultation bilaterally with normal respiratory effort. CV: RV heave.  Heart regular S1/S2 with loud P2, no S3/S4, no murmur.  No peripheral edema.   Abdomen: Soft, nontender, no hepatosplenomegaly, no distention.  Skin: Intact without lesions or rashes.  Neurologic: Alert and oriented x 3.  Psych: Normal affect. Extremities: No clubbing or cyanosis.  HEENT: Normal.   Telemetry   Sinus tach 110s-120s (Personally reviewed)    Labs    CBC Recent Labs    01/12/22 0531 01/13/22 0420  WBC 6.2 6.5  HGB 10.1* 10.1*  HCT 32.0* 33.0*  MCV 86.3 87.1  PLT 272 220   Basic Metabolic Panel Recent Labs    01/12/22 0531 01/13/22 0420  NA 134* 135  K 3.9 3.8  CL 104  105  CO2 21* 20*  GLUCOSE 107* 90  BUN <5* <5*  CREATININE 0.60 0.62  CALCIUM 7.9* 7.8*  MG 1.6* 1.6*   Liver Function Tests Recent Labs    01/12/22 0531  AST 54*  ALT 28  ALKPHOS 69  BILITOT 0.6  PROT 7.8  ALBUMIN 2.0*   No results for input(s): "LIPASE", "AMYLASE" in the last 72 hours. Cardiac Enzymes No results for input(s): "CKTOTAL", "CKMB", "CKMBINDEX", "TROPONINI" in the last 72 hours.  BNP: BNP (last 3 results) Recent Labs    11/18/21 1534 01/08/22 0225  BNP 100.5* 125.4*    ProBNP (last 3 results) No results for input(s): "PROBNP" in the last 8760 hours.   D-Dimer No results for input(s): "DDIMER" in the last 72 hours. Hemoglobin A1C No results for input(s): "HGBA1C" in the last 72 hours. Fasting Lipid Panel No results for input(s): "CHOL", "HDL", "LDLCALC", "TRIG", "CHOLHDL", "LDLDIRECT" in the last 72 hours. Thyroid Function Tests No results for input(s): "TSH", "T4TOTAL", "T3FREE", "THYROIDAB" in the last 72 hours.  Invalid input(s): "FREET3"   Other results:   Imaging    No results found.   Medications:     Scheduled Medications:  Chlorhexidine Gluconate Cloth  6 each Topical Daily   digoxin  0.125 mg Oral Daily   enoxaparin (LOVENOX) injection  40 mg Subcutaneous Q24H   etonogestrel  68 mg Subdermal Once   furosemide  20 mg Oral Daily   guaiFENesin  1,200 mg Oral BID   levothyroxine  150 mcg Oral QAC breakfast   macitentan  10 mg Oral Daily   magnesium oxide  400 mg Oral BID   sodium chloride flush  10-40 mL Intracatheter Q12H   sodium chloride flush  3 mL Intravenous Q12H   sodium chloride flush  3 mL Intravenous Q12H   tadalafil  40 mg Oral Daily    Infusions:  magnesium sulfate bolus IVPB      PRN Medications: acetaminophen **OR** acetaminophen, albuterol, lidocaine, sodium chloride flush   Assessment/Plan   1. Pulmonary hypertension with cor pulmonale: Patient had severe PAH by 7/23 RHC.  Echo in 2023 showed  mildly dilated RV with mild RV systolic dysfunction, PASP was elevated at 86 mmHg.  V/Q scan did not suggest the presence of chronic PEs.  HIV negative.  No known liver disease.  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 Echo in 10/23 showed EF 55-60%, D-shaped septum, moderate RV enlargement with moderate RV dysfunction, PASP 124 mmHg.  I think she is going to need aggressive treatment of her PH via pulmonary vasodilators.  She got pregnant recently but had miscarriage, this has limited treatment to tadalafil.  This admission, low output by co-ox and started on milrinone 0.25.  Today, co-ox 61% with CVP down to 2 on milrinone 0.125. - Stop milrinone today.  - She will be getting contraceptive shot today from OB/gyn.  She will then be able to start Opsumit 10 mg daily today.    - Continue tadalafil 40 mg daily.  - We will need to try to get her selexipag soon => working on initiation.   - Continue Lasix 20 mg po daily.  - Continue digoxin 0.125, will need to check a level tomorrow.  - RHC tomorrow to reassess filling pressures and PA pressure. Discussed risks/benefits with patient and she agrees to procedure.  - She will eventually need a sleep study.  2. SLE: On belimumab.  Followed by Dr. Lenna Gilford. Symptoms not typical of SLE flare, CRP only mildly elevated.  3. ?PNA: She has been on ceftriaxone/azithromycin but PCT < 0.1 and viral panel negative.  WBCs not elevated.  ?If presentation was due to cor pulmonale in setting of fluid shifts while she was pregnant (has now miscarried).  4. DVT prophylaxis: Lovenox.   Loralie Champagne 01/13/2022 8:30 AM

## 2022-01-13 NOTE — TOC Progression Note (Signed)
Transition of Care [facility]) - Progression Note    Patient Details  Name: Jessica Frazier MRN: [id number] Date of Birth: 06-25-2000  Transition of Care [facility]) CM/SW Contact  Zenon Mayo, RN Phone Number: 01/13/2022, 6:09 PM  Clinical Narrative:    for cath tomorrow,has a picc, TOC following.        Expected Discharge Plan and Services                                                 Social Determinants of Health (SDOH) Interventions    Readmission Risk Interventions     No data to display

## 2022-01-13 NOTE — Consult Note (Signed)
Reason for Consult:Nexplanon Placement  Referring Physician: Ignacia Felling Jessica Frazier is an 21 y.o. female.   21 yo G1P0010 with severe PAH needing a teratogenic medication requiring 2 forms of birth control   Counseling occurred yesterday about Nexplanon and returned for placement today.    Menstrual History: LMP 12/17/2021 when miscarriage occurred; no intercourse since and upt neg.    Past Medical History:  Diagnosis Date   Anemia    Phreesia 07/22/2020   Arthritis    Phreesia 07/22/2020   Bell's palsy    Hypothyroidism    Rheumatoid arthritis ([place])    Thyroid disease    Phreesia 07/22/2020    Past Surgical History:  Procedure Laterality Date   RIGHT HEART CATH N/A 10/08/2021   Procedure: RIGHT HEART CATH;  Surgeon: Larey Dresser, MD;  Location: [facility];  Service: Cardiovascular;  Laterality: N/A;    Family History  Problem Relation Age of Onset   Graves' disease Mother    Healthy Father    Diabetes Maternal Grandfather     Social History:  reports that she has never smoked. She has never used smokeless tobacco. She reports that she does not currently use alcohol. She reports that she does not use drugs.  Allergies:  Allergies  Allergen Reactions   Hydroxychloroquine     Other reaction(s): dyspnea   Other Diarrhea and Other (See Comments)    Omega XL   Rinvoq [Upadacitinib] Other (See Comments)    Enlarged the patient's liver- had to come to the ED, 2022    Medications: Scheduled:  Chlorhexidine Gluconate Cloth  6 each Topical Daily   digoxin  0.125 mg Oral Daily   enoxaparin (LOVENOX) injection  40 mg Subcutaneous Q24H   furosemide  20 mg Oral Daily   guaiFENesin  1,200 mg Oral BID   levothyroxine  150 mcg Oral QAC breakfast   macitentan  10 mg Oral Daily   magnesium oxide  400 mg Oral BID   sodium chloride flush  10-40 mL Intracatheter Q12H   sodium chloride flush  3 mL Intravenous Q12H   sodium chloride flush  3 mL  Intravenous Q12H   tadalafil  40 mg Oral Daily   Continuous:  Review of Systems  Blood pressure 118/78, pulse (!) 109, temperature 98.6 F (37 C), temperature source Oral, resp. rate 14, height _0  (1.575 m), weight 52.9 kg, SpO2 97 %. Physical Exam  Results for orders placed or performed during the hospital encounter of 01/08/22 (from the past 48 hour(s))  Basic metabolic panel     Status: Abnormal   Collection Time: 01/12/22  5:31 AM  Result Value Ref Range   Sodium 134 (L) 135 - 145 mmol/L   Potassium 3.9 3.5 - 5.1 mmol/L   Chloride 104 98 - 111 mmol/L   CO2 21 (L) 22 - 32 mmol/L   Glucose, Bld 107 (H) 70 - 99 mg/dL    Comment: Glucose reference range applies only to samples taken after fasting for at least 8 hours.   BUN <5 (L) 6 - 20 mg/dL   Creatinine, Ser 0.60 0.44 - 1.00 mg/dL   Calcium 7.9 (L) 8.9 - 10.3 mg/dL   GFR, Estimated >60 >60 mL/min    Comment: (NOTE) Calculated using the CKD-EPI Creatinine Equation (2021)    Anion gap 9 5 - 15    Comment: Performed at [facility] [address], [place], [place] [address]  CBC  Status: Abnormal   Collection Time: 01/12/22  5:31 AM  Result Value Ref Range   WBC 6.2 4.0 - 10.5 K/uL   RBC 3.71 (L) 3.87 - 5.11 MIL/uL   Hemoglobin 10.1 (L) 12.0 - 15.0 g/dL   HCT 32.0 (L) 36.0 - 46.0 %   MCV 86.3 80.0 - 100.0 fL   MCH 27.2 26.0 - 34.0 pg   MCHC 31.6 30.0 - 36.0 g/dL   RDW 18.7 (H) 11.5 - 15.5 %   Platelets 272 150 - 400 K/uL   nRBC 0.0 0.0 - 0.2 %    Comment: Performed at [facility] [address], [place], [place] [address]  Magnesium     Status: Abnormal   Collection Time: 01/12/22  5:31 AM  Result Value Ref Range   Magnesium 1.6 (L) 1.7 - 2.4 mg/dL    Comment: Performed at [facility] [address] [place], [place] [address]  Hepatic function panel     Status: Abnormal   Collection Time: 01/12/22  5:31 AM  Result Value Ref Range   Total Protein 7.8 6.5 - 8.1 g/dL   Albumin 2.0 (L)  3.5 - 5.0 g/dL   AST 54 (H) 15 - 41 U/L   ALT 28 0 - 44 U/L   Alkaline Phosphatase 69 38 - 126 U/L   Total Bilirubin 0.6 0.3 - 1.2 mg/dL   Bilirubin, Direct <0.1 0.0 - 0.2 mg/dL   Indirect Bilirubin NOT CALCULATED 0.3 - 0.9 mg/dL    Comment: Performed at [facility] [address], [place], [place] [address]  Cooxemetry Panel (carboxy, met, total hgb, O2 sat)     Status: Abnormal   Collection Time: 01/12/22  5:35 AM  Result Value Ref Range   Total hemoglobin 10.9 (L) 12.0 - 16.0 g/dL   O2 Saturation 60.6 %   Carboxyhemoglobin 1.1 0.5 - 1.5 %   Methemoglobin 1.2 0.0 - 1.5 %    Comment: Performed at [facility], [place] [address], [place], [place] [address]  Basic metabolic panel     Status: Abnormal   Collection Time: 01/13/22  4:20 AM  Result Value Ref Range   Sodium 135 135 - 145 mmol/L   Potassium 3.8 3.5 - 5.1 mmol/L   Chloride 105 98 - 111 mmol/L   CO2 20 (L) 22 - 32 mmol/L   Glucose, Bld 90 70 - 99 mg/dL    Comment: Glucose reference range applies only to samples taken after fasting for at least 8 hours.   BUN <5 (L) 6 - 20 mg/dL   Creatinine, Ser 0.62 0.44 - 1.00 mg/dL   Calcium 7.8 (L) 8.9 - 10.3 mg/dL   GFR, Estimated >60 >60 mL/min    Comment: (NOTE) Calculated using the CKD-EPI Creatinine Equation (2021)    Anion gap 10 5 - 15    Comment: Performed at [facility] [address], [place], [place] [address]  CBC     Status: Abnormal   Collection Time: 01/13/22  4:20 AM  Result Value Ref Range   WBC 6.5 4.0 - 10.5 K/uL   RBC 3.79 (L) 3.87 - 5.11 MIL/uL   Hemoglobin 10.1 (L) 12.0 - 15.0 g/dL   HCT 33.0 (L) 36.0 - 46.0 %   MCV 87.1 80.0 - 100.0 fL   MCH 26.6 26.0 - 34.0 pg   MCHC 30.6 30.0 - 36.0 g/dL   RDW 19.1 (H) 11.5 - 15.5 %  Platelets 309 150 - 400 K/uL   nRBC 0.0 0.0 - 0.2 %    Comment: Performed at [facility], [place] [address], [place], [place] [address]  Magnesium     Status: Abnormal   Collection Time: 01/13/22  4:20 AM   Result Value Ref Range   Magnesium 1.6 (L) 1.7 - 2.4 mg/dL    Comment: Performed at [facility] [address], [place], [place] [address]  Cooxemetry Panel (carboxy, met, total hgb, O2 sat)     Status: Abnormal   Collection Time: 01/13/22  4:22 AM  Result Value Ref Range   Total hemoglobin 10.3 (L) 12.0 - 16.0 g/dL   O2 Saturation 61 %   Carboxyhemoglobin 1.8 (H) 0.5 - 1.5 %   Methemoglobin 0.7 0.0 - 1.5 %    Comment: Performed at [facility] [address] [place], [place] [address]     Assessment/Plan: 21 yo female with severe PAH needing a teratongenic medication that requires 2 forms of birth control Condoms Nexplanon  UPT was negative. Consent was signed. Time out procedure was performed.  Her left arm was prepped with betadine and infiltrated with 3 cc of 1% lidocaine. After adequate anesthesia was assured, the Nexplanon device was placed over the tripceps approximately 8-10 cm superior to the humeral medial epicondyle.  The Nexplanon was placed superficially by tenting the skin.  Neplanon was palpated in the correct position by myself and patient.  Her arm was hemostatic and was bandaged. Patient tolerated the procedure well and was given appropriate discharge instructions.  Needs appt in [place] in 1 month for pap smear and LFTs.   Silas [place] 01/13/2022

## 2022-01-14 ENCOUNTER — Encounter (HOSPITAL_COMMUNITY): Payer: Self-pay | Admitting: Cardiology

## 2022-01-14 ENCOUNTER — Encounter (HOSPITAL_COMMUNITY)
Admission: EM | Disposition: A | Discharge status: Home / Self Care | Payer: Self-pay | Source: Home / Self Care | Attending: Internal Medicine

## 2022-01-14 DIAGNOSIS — I272 Pulmonary hypertension, unspecified: Secondary | ICD-10-CM | POA: Insufficient documentation

## 2022-01-14 DIAGNOSIS — Z3009 Encounter for other general counseling and advice on contraception: Secondary | ICD-10-CM

## 2022-01-14 HISTORY — PX: RIGHT HEART CATH: CATH118263

## 2022-01-14 LAB — CULTURE, BLOOD (ROUTINE X 2)
Culture: NO GROWTH
Culture: NO GROWTH
Special Requests: ADEQUATE
Special Requests: ADEQUATE

## 2022-01-14 LAB — BASIC METABOLIC PANEL
Anion gap: 6 (ref 5–15)
BUN: <5 mg/dL — ABNORMAL LOW (ref 6–20)
CO2: 24 mmol/L (ref 22–32)
Calcium: 8.1 mg/dL — ABNORMAL LOW (ref 8.9–10.3)
Chloride: 105 mmol/L (ref 98–111)
Creatinine, Ser: 0.63 mg/dL (ref 0.44–1.00)
GFR, Estimated: >60 mL/min (ref >60)
Glucose, Bld: 81 mg/dL (ref 70–99)
Potassium: 4 mmol/L (ref 3.5–5.1)
Sodium: 135 mmol/L (ref 135–145)

## 2022-01-14 LAB — POCT I-STAT EG7
Acid-base deficit: 2 mmol/L (ref 0.0–2.0)
Acid-base deficit: 2 mmol/L (ref 0.0–2.0)
Acid-base deficit: 2 mmol/L (ref 0.0–2.0)
Bicarbonate: 22.3 mmol/L (ref 20.0–28.0)
Bicarbonate: 22.3 mmol/L (ref 20.0–28.0)
Bicarbonate: 22.3 mmol/L (ref 20.0–28.0)
Calcium, Ion: 1.14 mmol/L — ABNORMAL LOW (ref 1.15–1.40)
Calcium, Ion: 1.17 mmol/L (ref 1.15–1.40)
Calcium, Ion: 1.17 mmol/L (ref 1.15–1.40)
HCT: 33 % — ABNORMAL LOW (ref 36.0–46.0)
HCT: 34 % — ABNORMAL LOW (ref 36.0–46.0)
HCT: 34 % — ABNORMAL LOW (ref 36.0–46.0)
Hemoglobin: 11.2 g/dL — ABNORMAL LOW (ref 12.0–15.0)
Hemoglobin: 11.6 g/dL — ABNORMAL LOW (ref 12.0–15.0)
Hemoglobin: 11.6 g/dL — ABNORMAL LOW (ref 12.0–15.0)
O2 Saturation: 60 %
O2 Saturation: 61 %
O2 Saturation: 61 %
Potassium: 3.9 mmol/L (ref 3.5–5.1)
Potassium: 4 mmol/L (ref 3.5–5.1)
Potassium: 4 mmol/L (ref 3.5–5.1)
Sodium: 138 mmol/L (ref 135–145)
Sodium: 138 mmol/L (ref 135–145)
Sodium: 138 mmol/L (ref 135–145)
TCO2: 23 mmol/L (ref 22–32)
TCO2: 23 mmol/L (ref 22–32)
TCO2: 23 mmol/L (ref 22–32)
pCO2, Ven: 34.6 mmHg — ABNORMAL LOW (ref 44–60)
pCO2, Ven: 35 mmHg — ABNORMAL LOW (ref 44–60)
pCO2, Ven: 36.4 mmHg — ABNORMAL LOW (ref 44–60)
pH, Ven: 7.394 (ref 7.25–7.43)
pH, Ven: 7.412 (ref 7.25–7.43)
pH, Ven: 7.418 (ref 7.25–7.43)
pO2, Ven: 31 mmHg — CL (ref 32–45)
pO2, Ven: 31 mmHg — CL (ref 32–45)
pO2, Ven: 31 mmHg — CL (ref 32–45)

## 2022-01-14 LAB — CBC
HCT: 32.8 % — ABNORMAL LOW (ref 36.0–46.0)
Hemoglobin: 10.1 g/dL — ABNORMAL LOW (ref 12.0–15.0)
MCH: 26.7 pg (ref 26.0–34.0)
MCHC: 30.8 g/dL (ref 30.0–36.0)
MCV: 86.8 fL (ref 80.0–100.0)
Platelets: 279 10*3/uL (ref 150–400)
RBC: 3.78 MIL/uL — ABNORMAL LOW (ref 3.87–5.11)
RDW: 19.1 % — ABNORMAL HIGH (ref 11.5–15.5)
WBC: 5.8 10*3/uL (ref 4.0–10.5)
nRBC: 0 % (ref 0.0–0.2)

## 2022-01-14 LAB — COOXEMETRY PANEL
Carboxyhemoglobin: 2.2 % — ABNORMAL HIGH (ref 0.5–1.5)
Methemoglobin: <0.7 % (ref 0.0–1.5)
O2 Saturation: 61.9 %
Total hemoglobin: 10.5 g/dL — ABNORMAL LOW (ref 12.0–16.0)

## 2022-01-14 LAB — DIGOXIN LEVEL: Digoxin Level: <0.2 ng/mL — ABNORMAL LOW (ref 0.8–2.0)

## 2022-01-14 LAB — MAGNESIUM: Magnesium: 1.9 mg/dL (ref 1.7–2.4)

## 2022-01-14 SURGERY — RIGHT HEART CATH
Anesthesia: LOCAL

## 2022-01-14 MED ORDER — SODIUM CHLORIDE 0.9 % IV SOLN: 250.0000 mL | INTRAVENOUS | Status: DC | PRN

## 2022-01-14 MED ORDER — FUROSEMIDE 20 MG PO TABS: 20.0000 mg | ORAL_TABLET | Freq: Every day | ORAL | 0 refills | Status: DC

## 2022-01-14 MED ORDER — LIDOCAINE HCL (PF) 1 % IJ SOLN
INTRAMUSCULAR | Status: AC
  Filled 2022-01-14: qty 30

## 2022-01-14 MED ORDER — HEPARIN (PORCINE) IN NACL 1000-0.9 UT/500ML-% IV SOLN
INTRAVENOUS | Status: DC | PRN
  Administered 2022-01-14 (×2): 500 mL

## 2022-01-14 MED ORDER — ONDANSETRON HCL 4 MG/2ML IJ SOLN: 4.0000 mg | Freq: Four times a day (QID) | INTRAMUSCULAR | Status: DC | PRN

## 2022-01-14 MED ORDER — TADALAFIL (PAH) 20 MG PO TABS: 40.0000 mg | ORAL_TABLET | Freq: Every day | ORAL | 2 refills | Status: DC

## 2022-01-14 MED ORDER — DIGOXIN 125 MCG PO TABS: 0.1250 mg | ORAL_TABLET | Freq: Every day | ORAL | 0 refills | Status: DC

## 2022-01-14 MED ORDER — SODIUM CHLORIDE 0.9% FLUSH: 3.0000 mL | INTRAVENOUS | Status: DC | PRN

## 2022-01-14 MED ORDER — SODIUM CHLORIDE 0.9% FLUSH: 3.0000 mL | Freq: Two times a day (BID) | INTRAVENOUS | Status: DC

## 2022-01-14 MED ORDER — LIDOCAINE HCL (PF) 1 % IJ SOLN
INTRAMUSCULAR | Status: DC | PRN
  Administered 2022-01-14: 2 mL

## 2022-01-14 MED ORDER — ACETAMINOPHEN 325 MG PO TABS: 650.0000 mg | ORAL_TABLET | ORAL | Status: DC | PRN

## 2022-01-14 MED ORDER — HEPARIN (PORCINE) IN NACL 1000-0.9 UT/500ML-% IV SOLN
INTRAVENOUS | Status: AC
  Filled 2022-01-14: qty 500

## 2022-01-14 MED ORDER — LABETALOL HCL 5 MG/ML IV SOLN: 10.0000 mg | INTRAVENOUS | Status: DC | PRN

## 2022-01-14 MED ORDER — ENOXAPARIN SODIUM 40 MG/0.4ML IJ SOSY: 40.0000 mg | PREFILLED_SYRINGE | INTRAMUSCULAR | Status: DC

## 2022-01-14 MED ORDER — HYDRALAZINE HCL 20 MG/ML IJ SOLN: 10.0000 mg | INTRAMUSCULAR | Status: DC | PRN

## 2022-01-14 SURGICAL SUPPLY — 8 items
CATH SWAN GANZ 7F STRAIGHT (CATHETERS) IMPLANT
GLIDESHEATH SLENDER 7FR .021G (SHEATH) IMPLANT
GLIDEWIRE ANGLED NITR .018X260 (WIRE) IMPLANT
KIT HEART LEFT (KITS) ×1 IMPLANT
PACK CARDIAC CATHETERIZATION (CUSTOM PROCEDURE TRAY) ×1 IMPLANT
PROTECTION STATION PRESSURIZED (MISCELLANEOUS) ×1
STATION PROTECTION PRESSURIZED (MISCELLANEOUS) IMPLANT
TRANSDUCER W/STOPCOCK (MISCELLANEOUS) ×1 IMPLANT

## 2022-01-14 NOTE — Discharge Summary (Signed)
Physician Discharge Summary   Patient: Jessica Frazier MRN: [id number] DOB: 05/19/2000  Admit date:     01/08/2022  Discharge date: 01/14/22  Discharge Physician: Flora Lipps   PCP: [name], NP   Recommendations at discharge:   Follow-up with your primary care provider in 1-2 week.   Check CBC, BMP, magnesium in the next visit. Follow-up with cardiology as has been scheduled on 01/21/2022.  Discharge Diagnoses: Principal Problem:   Chest pain Active Problems:   Elevated troponin   SIRS (systemic inflammatory response syndrome) (HCC)   CAP (community acquired pneumonia)   Dyspnea   Diastolic dysfunction   Hyponatremia   Systemic lupus erythematosus (HCC)   Normocytic anemia   Acquired hypothyroidism   Diarrhea   Birth control counseling   Pulmonary hypertension ([place])  Resolved Problems:   * No resolved hospital problems. *  Hospital Course: Patient is a 21 years old female with past medical history of SLE, seronegative rheumatoid arthritis, pulmonary hypertension/RV failure, recent miscarriage, presented to the hospital with left-sided chest pain, shortness of breath and cough.  In the ED, patient was noted to be febrile, tachycardic with tachypnea.  Initial labs showed a sodium of 132, BNP 125.  Troponin was mildly elevated at 32-42.  Patient was then admitted hospital for further evaluation and treatment.  Cardiology on board for further management.     Following conditions were addressed during hospitalization,  Chest pain, possible pneumonia Patient initially presented with pleuritic chest pain and shortness of breath and fever of 100.19F.  CTA chest without PE but some effusion and decreased lung volume with increased interstitial and peribronchial opacity.  Procalcitonin was negative.  No leukocytosis.  Respiratory viral panel was negative.  Blood cultures negative so far in 5 days.  Strep urinary antigen and Legionella urinary antigen  negative.  Clinically improved.  Received Rocephin and Zithromax during hospitalization and has completed course..   Severe pulmonary hypertension/RV failure/sinus tachycardia/lactic acidosis Heart failure team followed the patient during hospitalization..    Likely pulmonary hypertension from SLE.  Continue digoxin, diuretics, Macitentan, and tadalafil.  Off beta-blocker.  2D echocardiogram from 12/18/2021 showed LV ejection fraction of 55 to 60% with no regional wall motion abnormality but right ventricular systolic function is reduced with severely elevated pulmonary artery pressure at 124 mmHg.  Off milrinone drip since 01/13/2022 today.  Digoxin level pending.  Patient underwent right sided cardiac catheterization on 01/14/2022.  From cardiology point of view, patient is stable for discharge today.    Recent miscarriage.  OB/GYN on board and patient received Nexplanon so that she can continue Opsumit daily.   Hypokalemia.  Improved after replacement.  Potassium 4.0 today.   Mild hypomagnesemia.  Replenished.  Magnesium of 1.9 today.   Mild hyponatremia Improved.  Latest sodium of 135.   SLE /RA /immunosuppressed status  On  belimumab -(receives subQ q'sunday).  Follows up with Dr. Hawks as outpatient.  Currently not in acute flare   Hypothyroidism Continue synthroid    Consultants:  Cardiology  Procedures performed:  Right-sided cardiac catheterization on 01/14/2022  Nexplanon insertion  Disposition: Home  Diet recommendation:  Discharge Diet Orders (From admission, onward)     Start     Ordered   01/14/22 0000  Diet - low sodium heart healthy        11'$ /02/23 1412           DISCHARGE MEDICATION: Allergies as of 01/14/2022       Reactions   Hydroxychloroquine  Other reaction(s): dyspnea   Other Diarrhea, Other (See Comments)   Omega XL   Rinvoq [upadacitinib] Other (See Comments)   Enlarged the patient's liver- had to come to the ED, 2022        Medication List      TAKE these medications    b complex vitamins capsule Take 1 capsule by mouth daily.   Benlysta 200 MG/ML Soaj Generic drug: Belimumab Inject 200 mg into the skin every 'Sunday.   cholecalciferol 25 MCG (1000 UNIT) tablet Commonly known as: VITAMIN D3 Take 1,000 Units by mouth daily.   digoxin 0.125 MG tablet Commonly known as: LANOXIN Take 1 tablet (0.125 mg total) by mouth daily. Start taking on: January 15, 2022   furosemide 20 MG tablet Commonly known as: LASIX Take 1 tablet (20 mg total) by mouth daily. Start taking on: January 15, 2022   levothyroxine 150 MCG tablet Commonly known as: SYNTHROID Take 150 mcg by mouth daily before breakfast.   macitentan 10 MG tablet Commonly known as: OPSUMIT Take 10 mg by mouth daily.   tadalafil (PAH) 20 MG tablet Commonly known as: ADCIRCA Take 2 tablets (40 mg total) by mouth daily. What changed: how much to take   Uptravi 200 & 800 MCG Tbpk Generic drug: Selexipag Take 200 mcg by mouth in the morning and at bedtime. Uptitrate every 2 weeks to maximum tolerated dose or 1600 mcg BID        Follow-up Information     McLean, Dalton S, MD Follow up on 01/21/2022.   Specialty: Cardiology Why: 01/21/22 at 08:40am  In the [facility] for hospital follow-up Contact information: [address] [place] [address] [phone]                Subjective:   Today, patient was seen and examined at bedside.  Denies any chest pain, shortness of breath, dyspnea, PND.  Status post right heart catheter   Discharge Exam: Filed Weights   01/11/22 0358 01/12/22 0237 01/13/22 0410  Weight: 55.6 kg 53.1 kg 52.9 kg      11'$ /04/2021    2:30 PM 01/14/2022    2:15 PM 01/14/2022    1:26 PM  Vitals with BMI  Systolic [phone]  Diastolic 76 76 83    General:  Average built, not in obvious distress HENT:   No scleral pallor or icterus noted. Oral mucosa is  moist.  Chest:  Clear breath sounds.  Diminished breath sounds bilaterally. No crackles or wheezes.  CVS: S1 &S2 heard.  systolic murmur, regular rate and rhythm. Abdomen: Soft, nontender, nondistended.  Bowel sounds are heard.   Extremities: No cyanosis, clubbing or edema.  Peripheral pulses are palpable. Psych: Alert, awake and oriented, normal mood CNS:  No cranial nerve deficits.  Power equal in all extremities.   Skin: Warm and dry.  No rashes noted.  Condition at discharge: good  The results of significant diagnostics from this hospitalization (including imaging, microbiology, ancillary and laboratory) are listed below for reference.   Imaging Studies: CARDIAC CATHETERIZATION  Result Date: 01/14/2022 1. Severe pulmonary arterial hypertension. 2. Normal filling pressures. 3. Preserved cardiac output.   [place] EKG SITE RITE  Result Date: 01/09/2022 If Site Rite image not attached, placement could not be confirmed due to current cardiac rhythm.  CT Angio Chest PE W and/or Wo Contrast  Result Date: 01/08/2022 CLINICAL DATA:  21 year old female with lupus and pulmonary artery hypertension. Chest  pain since yesterday on the left. Shortness of breath. EXAM: CT ANGIOGRAPHY CHEST WITH CONTRAST TECHNIQUE: Multidetector CT imaging of the chest was performed using the standard protocol during bolus administration of intravenous contrast. Multiplanar CT image reconstructions and MIPs were obtained to evaluate the vascular anatomy. RADIATION DOSE REDUCTION: This exam was performed according to the departmental dose-optimization program which includes automated exposure control, adjustment of the mA and/or kV according to patient size and/or use of iterative reconstruction technique. CONTRAST:  21m OMNIPAQUE IOHEXOL 350 MG/ML SOLN COMPARISON:  Chest radiographs 0232 hours today and earlier. Chest CTA 06/02/2020. FINDINGS: Cardiovascular: Excellent contrast bolus timing in the pulmonary arterial tree.  Some progression of central pulmonary artery enlargement compared to the CTA last year, main PA now measures up to 34 mm diameter at the level of the ascending aorta, versus 27 mm last year. Likewise, enlarged left and right main pulmonary arteries and arteries at the hilum which taper. But no pulmonary artery filling defect, evidence of thrombus. Cardiac size remains within normal limits. Lower lung volumes. No definite pleural effusion. Negative visible aorta. No calcified coronary artery atherosclerosis is evident. Mediastinum/Nodes: No convincing mediastinal mass or lymphadenopathy. Lungs/Pleura: Lower lung volumes. Major airways remain patent. Trace or small bilateral layering pleural effusions are new. Additional mild widespread increased pulmonary interstitial opacity and patchy nonspecific peribronchial opacity which is greater in the left lung, especially the left lower lobe, and might be atelectasis. Upper Abdomen: A Patak steatosis appears resolved since last year. Negative visible liver, spleen, stomach, pancreas. Musculoskeletal: Negative. Review of the MIP images confirms the above findings. IMPRESSION: 1. Negative for acute pulmonary embolus. 2. Positive for central pulmonary artery enlargement since a CTA last year, strongly suggesting Pulmonary Artery Hypertension. 3. New small/trace layering pleural effusions and lower lung volumes with nonspecific increased interstitial and peribronchial opacity. Differential considerations include atelectasis, interstitial edema, developing bilateral bronchopneumonia. Electronically Signed   By: HGenevie AnnM.D.   On: 01/08/2022 05:58   DG Chest 1 View  Result Date: 01/08/2022 CLINICAL DATA:  Chest pain for 2 days with shortness of breath EXAM: CHEST  1 VIEW COMPARISON:  08/04/2021 FINDINGS: Cardiac shadow is stable. The lungs are well aerated bilaterally with the exception of left basilar infiltrate. Small effusion is present as well. No bony abnormality is  noted. IMPRESSION: Mild left basilar infiltrate. Electronically Signed   By: MInez CatalinaM.D.   On: 01/08/2022 02:48   ECHOCARDIOGRAM COMPLETE  Result Date: 12/18/2021    ECHOCARDIOGRAM REPORT   Patient Name:   Jessica Frazier Date of Exam: 12/18/2021 Medical Rec #:  [id number]                       Height:       62.0 in Accession #:    [id number]                      Weight:       119.6 lb Date of Birth:  [birth year]                       BSA:          1.536 m Patient Age:    21 years                         BP:           119/81  mmHg Patient Gender: F                                HR:           113 bpm. Exam Location:  Outpatient Procedure: 2D Echo, 3D Echo, Cardiac Doppler, Color Doppler and Strain Analysis Indications:    Pulmonary hypertension  History:        Patient has prior history of Echocardiogram examinations. CHF,                 Prior echo outside hospital, Pulmonary HTN;                 Arrythmias:Tachycardia. 1st trimester pregnancy seen at [place]                 ED for vaginal bleeding yestrday.  Sonographer:    Merrie Roof RDCS Referring Phys: [place]  1. Left ventricular ejection fraction, by estimation, is 55 to 60%. The left ventricle has normal function. The left ventricle has no regional wall motion abnormalities. Left ventricular diastolic parameters are consistent with Grade I diastolic dysfunction (impaired relaxation).  2. D-shaped septum suggests RV pressure/volume overload. Right ventricular systolic function is moderately reduced. The right ventricular size is moderately enlarged. There is severely elevated pulmonary artery systolic pressure. The estimated right ventricular systolic pressure is 625.6 mmHg.  3. Right atrial size was mildly dilated.  4. The mitral valve is normal in structure. No evidence of mitral valve regurgitation. No evidence of mitral stenosis.  5. The aortic valve is tricuspid. Aortic valve regurgitation is not  visualized. No aortic stenosis is present.  6. The pulmonic valve was abnormal.  7. The inferior vena cava is normal in size with greater than 50% respiratory variability, suggesting right atrial pressure of 3 mmHg. FINDINGS  Left Ventricle: Left ventricular ejection fraction, by estimation, is 55 to 60%. The left ventricle has normal function. The left ventricle has no regional wall motion abnormalities. The left ventricular internal cavity size was normal in size. There is  no left ventricular hypertrophy. Left ventricular diastolic parameters are consistent with Grade I diastolic dysfunction (impaired relaxation). Right Ventricle: D-shaped septum suggests RV pressure/volume overload. The right ventricular size is moderately enlarged. No increase in right ventricular wall thickness. Right ventricular systolic function is moderately reduced. There is severely elevated pulmonary artery systolic pressure. The tricuspid regurgitant velocity is 5.50 m/s, and with an assumed right atrial pressure of 3 mmHg, the estimated right ventricular systolic pressure is 389.3 mmHg. Left Atrium: Left atrial size was normal in size. Right Atrium: Right atrial size was mildly dilated. Pericardium: There is no evidence of pericardial effusion. Mitral Valve: The mitral valve is normal in structure. No evidence of mitral valve regurgitation. No evidence of mitral valve stenosis. Tricuspid Valve: The tricuspid valve is normal in structure. Tricuspid valve regurgitation is mild. Aortic Valve: The aortic valve is tricuspid. Aortic valve regurgitation is not visualized. No aortic stenosis is present. Aortic valve mean gradient measures 3.0 mmHg. Aortic valve peak gradient measures 5.5 mmHg. Aortic valve area, by VTI measures 1.81 cm. Pulmonic Valve: The pulmonic valve was abnormal. Pulmonic valve regurgitation is mild to moderate. No evidence of pulmonic stenosis. Aorta: The aortic root is normal in size and structure. Venous: The inferior  vena cava is normal in size with greater than 50% respiratory variability, suggesting right atrial pressure of 3 mmHg. IAS/Shunts: No  atrial level shunt detected by color flow Doppler.  LEFT VENTRICLE PLAX 2D LVIDd:         3.10 cm   Diastology LVIDs:         1.70 cm   LV e' medial:    7.18 cm/s LV PW:         0.90 cm   LV E/e' medial:  8.2 LV IVS:        0.80 cm   LV e' lateral:   14.50 cm/s LVOT diam:     1.70 cm   LV E/e' lateral: 4.1 LV SV:         31 LV SV Index:   20 LVOT Area:     2.27 cm  RIGHT VENTRICLE RV Basal diam:  3.60 cm RV S prime:     10.70 cm/s TAPSE (M-mode): 1.3 cm LEFT ATRIUM             Index        RIGHT ATRIUM           Index LA diam:        2.80 cm 1.82 cm/m   RA Area:     14.90 cm LA Vol (A2C):   15.7 ml 10.22 ml/m  RA Volume:   41.30 ml  26.88 ml/m LA Vol (A4C):   12.5 ml 8.14 ml/m LA Biplane Vol: 15.0 ml 9.76 ml/m  AORTIC VALVE AV Area (Vmax):    1.82 cm AV Area (Vmean):   1.74 cm AV Area (VTI):     1.81 cm AV Vmax:           117.00 cm/s AV Vmean:          82.100 cm/s AV VTI:            0.173 m AV Peak Grad:      5.5 mmHg AV Mean Grad:      3.0 mmHg LVOT Vmax:         93.80 cm/s LVOT Vmean:        62.900 cm/s LVOT VTI:          0.138 m LVOT/AV VTI ratio: 0.80  AORTA Ao Root diam: 2.20 cm Ao Asc diam:  2.10 cm MITRAL VALVE               TRICUSPID VALVE MV Area (PHT): 5.02 cm    TR Peak grad:   121.0 mmHg MV Decel Time: 151 msec    TR Vmax:        550.00 cm/s MV E velocity: 59.20 cm/s MV A velocity: 73.40 cm/s  SHUNTS MV E/A ratio:  0.81        Systemic VTI:  0.14 m                            Systemic Diam: 1.70 cm Dalton McleanMD Electronically signed by Franki Monte Signature Date/Time: 12/18/2021/2:12:42 PM    Final     Microbiology: Results for orders placed or performed during the hospital encounter of 01/08/22  Resp Panel by RT-PCR (Flu A&B, Covid) Anterior Nasal Swab     Status: None   Collection Time: 01/08/22  7:45 AM   Specimen: Anterior Nasal Swab  Result  Value Ref Range Status   SARS Coronavirus 2 by RT PCR NEGATIVE NEGATIVE Final    Comment: (NOTE) SARS-CoV-2 target nucleic acids are NOT DETECTED.  The SARS-CoV-2 RNA is generally detectable in upper respiratory specimens during  the acute phase of infection. The lowest concentration of SARS-CoV-2 viral copies this assay can detect is 138 copies/mL. A negative result does not preclude SARS-Cov-2 infection and should not be used as the sole basis for treatment or other patient management decisions. A negative result may occur with  improper specimen collection/handling, submission of specimen other than nasopharyngeal swab, presence of viral mutation(s) within the areas targeted by this assay, and inadequate number of viral copies(<138 copies/mL). A negative result must be combined with clinical observations, patient history, and epidemiological information. The expected result is Negative.  Fact Sheet for Patients:  EntrepreneurPulse.com.au  Fact Sheet for Healthcare Providers:  IncredibleEmployment.be  This test is no t yet approved or cleared by the [place] FDA and  has been authorized for detection and/or diagnosis of SARS-CoV-2 by FDA under an Emergency Use Authorization (EUA). This EUA will remain  in effect (meaning this test can be used) for the duration of the COVID-19 declaration under Section 564(b)(1) of the Act, 21 U.S.C.section 360bbb-3(b)(1), unless the authorization is terminated  or revoked sooner.       Influenza A by PCR NEGATIVE NEGATIVE Final   Influenza B by PCR NEGATIVE NEGATIVE Final    Comment: (NOTE) The Xpert Xpress SARS-CoV-2/FLU/RSV plus assay is intended as an aid in the diagnosis of influenza from Nasopharyngeal swab specimens and should not be used as a sole basis for treatment. Nasal washings and aspirates are unacceptable for Xpert Xpress SARS-CoV-2/FLU/RSV testing.  Fact Sheet for  Patients: EntrepreneurPulse.com.au  Fact Sheet for Healthcare Providers: IncredibleEmployment.be  This test is not yet approved or cleared by the [place] FDA and has been authorized for detection and/or diagnosis of SARS-CoV-2 by FDA under an Emergency Use Authorization (EUA). This EUA will remain in effect (meaning this test can be used) for the duration of the COVID-19 declaration under Section 564(b)(1) of the Act, 21 U.S.C. section 360bbb-3(b)(1), unless the authorization is terminated or revoked.  Performed at [facility], [place] [address] [place], [place] [address]   Respiratory (~20 pathogens) panel by PCR     Status: None   Collection Time: 01/08/22  7:45 AM   Specimen: Nasopharyngeal Swab; Respiratory  Result Value Ref Range Status   Adenovirus NOT DETECTED NOT DETECTED Final   Coronavirus 229E NOT DETECTED NOT DETECTED Final    Comment: (NOTE) The Coronavirus on the Respiratory Panel, DOES NOT test for the novel  Coronavirus (2019 nCoV)    Coronavirus HKU1 NOT DETECTED NOT DETECTED Final   Coronavirus NL63 NOT DETECTED NOT DETECTED Final   Coronavirus OC43 NOT DETECTED NOT DETECTED Final   Metapneumovirus NOT DETECTED NOT DETECTED Final   Rhinovirus / Enterovirus NOT DETECTED NOT DETECTED Final   Influenza A NOT DETECTED NOT DETECTED Final   Influenza B NOT DETECTED NOT DETECTED Final   Parainfluenza Virus 1 NOT DETECTED NOT DETECTED Final   Parainfluenza Virus 2 NOT DETECTED NOT DETECTED Final   Parainfluenza Virus 3 NOT DETECTED NOT DETECTED Final   Parainfluenza Virus 4 NOT DETECTED NOT DETECTED Final   Respiratory Syncytial Virus NOT DETECTED NOT DETECTED Final   Bordetella pertussis NOT DETECTED NOT DETECTED Final   Bordetella Parapertussis NOT DETECTED NOT DETECTED Final   Chlamydophila pneumoniae NOT DETECTED NOT DETECTED Final   Mycoplasma pneumoniae NOT DETECTED NOT DETECTED Final    Comment: Performed at  [facility], [place]. [address], [place], [place] [address]  MRSA Next Gen by PCR, Nasal  Status: None   Collection Time: 01/08/22  8:23 PM   Specimen: Nasal Mucosa; Nasal Swab  Result Value Ref Range Status   MRSA by PCR Next Gen NOT DETECTED NOT DETECTED Final    Comment: (NOTE) The GeneXpert MRSA Assay (FDA approved for NASAL specimens only), is one component of a comprehensive MRSA colonization surveillance program. It is not intended to diagnose MRSA infection nor to guide or monitor treatment for MRSA infections. Test performance is not FDA approved in patients less than 99 years old. Performed at [facility], [place] [address], [place], [place] [address]   Culture, blood (Routine X 2) w Reflex to ID Panel     Status: None   Collection Time: 01/09/22  3:47 PM   Specimen: BLOOD  Result Value Ref Range Status   Specimen Description BLOOD RIGHT ANTECUBITAL  Final   Special Requests   Final    BOTTLES DRAWN AEROBIC AND ANAEROBIC Blood Culture adequate volume   Culture   Final    NO GROWTH 5 DAYS Performed at [facility], [place] [address] [place], [place] [address]    Report Status 01/14/2022 FINAL  Final  Culture, blood (Routine X 2) w Reflex to ID Panel     Status: None   Collection Time: 01/09/22  3:58 PM   Specimen: BLOOD RIGHT FOREARM  Result Value Ref Range Status   Specimen Description BLOOD RIGHT FOREARM  Final   Special Requests   Final    BOTTLES DRAWN AEROBIC AND ANAEROBIC Blood Culture adequate volume   Culture   Final    NO GROWTH 5 DAYS Performed at [facility], [place] [address] [place],  [address]    Report Status 01/14/2022 FINAL  Final    Labs: CBC: Recent Labs  Lab 01/08/22 0225 01/09/22 0014 01/10/22 3299 01/12/22 0531 01/13/22 0420 01/14/22 0400  WBC 6.0 4.8 5.2 6.2 6.5 5.8  NEUTROABS 4.3  --  3.3  --   --   --   HGB 11.4* 10.6* 9.7* 10.1* 10.1* 10.1*  HCT 35.9* 33.8* 30.9* 32.0* 33.0* 32.8*  MCV 86.5 85.1 86.6  86.3 87.1 86.8  PLT 372 320 303 272 309 242   Basic Metabolic Panel: Recent Labs  Lab 01/10/22 0338 01/11/22 0455 01/12/22 0531 01/13/22 0420 01/14/22 0400  NA 135 135 134* 135 135  K 3.5 3.4* 3.9 3.8 4.0  CL 110 110 104 105 105  CO2 22 21* 21* 20* 24  GLUCOSE 90 87 107* 90 81  BUN 5* <5* <5* <5* <5*  CREATININE 0.61 0.59 0.60 0.62 0.63  CALCIUM 7.7* 7.8* 7.9* 7.8* 8.1*  MG 1.8  --  1.6* 1.6* 1.9  PHOS 3.4  --   --   --   --    Liver Function Tests: Recent Labs  Lab 01/08/22 1822 01/10/22 0338 01/12/22 0531  AST 67* 53* 54*  ALT '29 26 28  '$ ALKPHOS 65 63 69  BILITOT 0.8 0.6 0.6  PROT 7.8 7.2 7.8  ALBUMIN 2.1* 1.9* 2.0*   CBG: No results for input(s): "GLUCAP" in the last 168 hours.  Discharge time spent: greater than 30 minutes.  Signed: Flora Lipps, MD [facility] 01/14/2022

## 2022-01-14 NOTE — Interval H&P Note (Signed)
History and Physical Interval Note:  01/14/2022 12:57 PM  [place]  has presented today for surgery, with the diagnosis of hp.  The various methods of treatment have been discussed with the patient and family. After consideration of risks, benefits and other options for treatment, the patient has consented to  Procedure(s): RIGHT HEART CATH (N/A) as a surgical intervention.  The patient's history has been reviewed, patient examined, no change in status, stable for surgery.  I have reviewed the patient's chart and labs.  Questions were answered to the patient's satisfaction.     Quinlyn Tep [employer]

## 2022-01-14 NOTE — TOC Transition Note (Signed)
Transition of Care [facility]) - CM/SW Discharge Note   Patient Details  Name: Jessica Frazier MRN: [id number] Date of Birth: December 13, 2000  [facility]) CM/SW Contact:  Zenon Mayo, RN Phone Number: 01/14/2022, 3:29 PM   Clinical Narrative:    Patient is for dc today, she is indep, she has no needs. Her Mother will transport her home today, she is at the bedside.          Patient Goals and CMS Choice        Discharge Placement                       Discharge Plan and Services                                     Social Determinants of Health (SDOH) Interventions     Readmission Risk Interventions     No data to display

## 2022-01-14 NOTE — Progress Notes (Signed)
PROGRESS NOTE    Jessica Frazier  WUJ:[id number] DOB: November 13, 2000 DOA: 01/08/2022 PCP: [name], NP    Brief Narrative:  Patient is a 21 years old female with past medical history of SLE, seronegative rheumatoid arthritis, pulmonary hypertension/RV failure, recent miscarriage, presented to the hospital with left-sided chest pain, shortness of breath and cough.  In the ED, patient was noted to be febrile, tachycardic with tachypnea.  Initial labs showed a sodium of 132, BNP 125.  Troponin was mildly elevated at 32-42.  Patient was then admitted hospital for further evaluation and treatment.  Cardiology on board for further management.    Assessment & Plan:  Principal Problem:   Chest pain Active Problems:   Elevated troponin   SIRS (systemic inflammatory response syndrome) (HCC)   CAP (community acquired pneumonia)   Dyspnea   Diastolic dysfunction   Hyponatremia   Systemic lupus erythematosus (HCC)   Normocytic anemia   Acquired hypothyroidism   Diarrhea   Birth control counseling   Pulmonary hypertension (HCC)  Chest pain, possible pneumonia Patient initially presented with pleuritic chest pain and shortness of breath and fever of 100.60F.  CTA chest without PE but some effusion and decreased lung volume with increased interstitial and peribronchial opacity.  Procalcitonin was negative.  No leukocytosis.  Respiratory viral panel was negative.  Blood cultures negative so far in 5 days.  Strep urinary antigen and Legionella urinary antigen negative.  Clinically improved.  Received Rocephin and Zithromax during hospitalization.  Severe pulmonary hypertension/RV failure/sinus tachycardia/lactic acidosis Heart failure team on board.    Likely pulmonary hypertension from SLE.  Continue digoxin, diuretics, Macitentan, and tadalafil.  Off beta-blocker.  2D echocardiogram from 12/18/2021 showed LV ejection fraction of 55 to 60% with no regional wall motion abnormality  but right ventricular systolic function is reduced with severely elevated pulmonary artery pressure at 124 mmHg.  Off milrinone drip since 01/13/2022 today.  Digoxin level pending.  Plan for right heart cath today.  Recent miscarriage.Marland Kitchen  OB/GYN on board and patient received Nexplanon so that she can continue Opsumit daily.  Hypokalemia.  Improved after replacement.  Potassium 4.0 today.  Mild hypomagnesemia.  Continue magnesium oxide.  Magnesium of 1.9 today.  Mild hyponatremia Improved.  Latest sodium of 135.  SLE /RA /immunosuppressed status  Continue to hold belimumab -(receives subQ q"sunday).  Follows up with Dr. Hawks as outpatient.  Currently not in acute flare  Hypothyroidism Continue synthroid   DVT prophylaxis: enoxaparin (LOVENOX) injection 40 mg Start: 01/08/22 1000      Code Status: Full Code  Family Communication:  Spoke with the patient's mother at bedside on 01/13/2022  Disposition: Home Dispo: The patient is from: home                  Anticipated d/c date is: Uncertain at this time.  Disposition plan as per cardiology recommendation.  Antimicrobials:    Anti-infectives (From admission, onward)    Start     Dose/Rate Route Frequency Ordered Stop   01/08/22 0800  cefTRIAXone (ROCEPHIN) 2 g in sodium chloride 0.9 % 100 mL IVPB        2 g 200 mL/hr over 30 Minutes Intravenous Every 24 hours 01/08/22 0729 01/12/22 1007   01/08/22 0800  azithromycin (ZITHROMAX) tablet 500 mg        50"$ 0 mg Oral Daily 01/08/22 0729 01/12/22 0932       Subjective:  Today, patient was seen and examined at bedside.  Denies any  chest pain, shortness of breath, dyspnea, PND.  Awaiting for right heart catheter today.  Off milrinone drip. .  Objective: Vitals:   01/14/22 0402 01/14/22 0736 01/14/22 0908 01/14/22 1111  BP:  107/67  123/69  Pulse:   (!) 105   Resp: 18     Temp: 99.3 F (37.4 C) 98.8 F (37.1 C)  98.4 F (36.9 C)  TempSrc: Oral     SpO2:      Weight:       Height:        Intake/Output Summary (Last 24 hours) at 01/14/2022 1147 Last data filed at 01/14/2022 0200 Gross per 24 hour  Intake 890 ml  Output 1200 ml  Net -310 ml    Filed Weights   01/11/22 0358 01/12/22 0237 01/13/22 0410  Weight: 55.6 kg 53.1 kg 52.9 kg    Examination: Body mass index is 21.33 kg/m.   General:  Average built, not in obvious distress HENT:   No scleral pallor or icterus noted. Oral mucosa is moist.  Chest:  Clear breath sounds.  Diminished breath sounds bilaterally. No crackles or wheezes.  CVS: S1 &S2 heard.  Tachycardia, systolic murmur, regular rate and rhythm. Abdomen: Soft, nontender, nondistended.  Bowel sounds are heard.   Extremities: No cyanosis, clubbing or edema.  Peripheral pulses are palpable. Psych: Alert, awake and oriented, normal mood CNS:  No cranial nerve deficits.  Power equal in all extremities.   Skin: Warm and dry.  No rashes noted.   Data Reviewed: I personally reviewed the labs and imaging studies.     Scheduled Meds:  Chlorhexidine Gluconate Cloth  6 each Topical Daily   digoxin  0.125 mg Oral Daily   enoxaparin (LOVENOX) injection  40 mg Subcutaneous Q24H   furosemide  20 mg Oral Daily   guaiFENesin  1,200 mg Oral BID   levothyroxine  150 mcg Oral QAC breakfast   macitentan  10 mg Oral Daily   magnesium oxide  400 mg Oral BID   sodium chloride flush  10-40 mL Intracatheter Q12H   sodium chloride flush  3 mL Intravenous Q12H   sodium chloride flush  3 mL Intravenous Q12H   tadalafil  40 mg Oral Daily   Continuous Infusions:  sodium chloride     sodium chloride 10 mL/hr at 01/14/22 0549      LOS: 5 days    Flora Lipps, MD  [facility] 01/14/2022, 11:47 AM

## 2022-01-14 NOTE — Progress Notes (Signed)
Per NP Ferrel Logan okay to discharge, bed rest is up, walked around the unit with mobility without symptoms.

## 2022-01-14 NOTE — Progress Notes (Signed)
Mobility Specialist Progress Note    01/14/22 1458  Mobility  Activity Ambulated independently in hallway  Level of Assistance Independent  Assistive Device None  Distance Ambulated (ft) 420 ft  Activity Response Tolerated well  $Mobility charge 1 Mobility   Pre-Mobility: 116 HR, 107/77 (86) BP  Pt received in bed and agreeable. No complaints on walk. Returned to room.  Hildred Alamin Mobility Specialist  Secure Chat Only

## 2022-01-14 NOTE — Progress Notes (Addendum)
Patient ID: Jessica Frazier, female   DOB: 29-Jul-2000, 21 y.o.   MRN: [id number]     Advanced Heart Failure Rounding Note  PCP-Cardiologist: None   Subjective:    Started on milrinone 10/28 d/t co-ox 46% and lactic acidosis.   Co-ox 62%, milrinone gtt stopped yesterday  CVP 2/3 on po Lasix.   Remains tachycardic in 100s.   Denies CP and SOB. Mild dyspnea with ambulation.   Objective:   Weight Range: 52.9 kg Body mass index is 21.33 kg/m.   Vital Signs:   Temp:  [98.1 F (36.7 C)-99.4 F (37.4 C)] 99.3 F (37.4 C) (11/02 0402) Pulse Rate:  [105-129] 105 (11/01 2307) Resp:  [14-18] 18 (11/02 0402) BP: (111-131)/(71-78) 131/76 (11/01 2307) SpO2:  [96 %-99 %] 99 % (11/01 2307) Last BM Date : 01/13/22  Weight change: Filed Weights   01/11/22 0358 01/12/22 0237 01/13/22 0410  Weight: 55.6 kg 53.1 kg 52.9 kg    Intake/Output:   Intake/Output Summary (Last 24 hours) at 01/14/2022 0704 Last data filed at 01/14/2022 0200 Gross per 24 hour  Intake 1042.07 ml  Output 1500 ml  Net -457.93 ml     Physical Exam  CVP 2/3 General:  well appearing.  No respiratory difficulty HEENT: normal Neck: supple. No JVD. Carotids 2+ bilat; no bruits. No lymphadenopathy or thyromegaly appreciated. Cor: PMI nondisplaced. Reg tachy. No rubs, gallops. + RV lift 2/6 TR  Lungs: clear Abdomen: soft, nontender, nondistended. No hepatosplenomegaly. No bruits or masses. Good bowel sounds. Extremities: no cyanosis, clubbing, rash, edema. PICC RUE, LUE bandaged, no hematoma or ecchymosis Neuro: alert & oriented x 3, cranial nerves grossly intact. moves all 4 extremities w/o difficulty. Affect pleasant.   Telemetry   Sinus tach 100s (Personally reviewed)    Labs    CBC Recent Labs    01/13/22 0420 01/14/22 0400  WBC 6.5 5.8  HGB 10.1* 10.1*  HCT 33.0* 32.8*  MCV 87.1 86.8  PLT 309 025   Basic Metabolic Panel Recent Labs    01/13/22 0420 01/14/22 0400  NA 135  135  K 3.8 4.0  CL 105 105  CO2 20* 24  GLUCOSE 90 81  BUN <5* <5*  CREATININE 0.62 0.63  CALCIUM 7.8* 8.1*  MG 1.6* 1.9   Liver Function Tests Recent Labs    01/12/22 0531  AST 54*  ALT 28  ALKPHOS 69  BILITOT 0.6  PROT 7.8  ALBUMIN 2.0*   No results for input(s): "LIPASE", "AMYLASE" in the last 72 hours. Cardiac Enzymes No results for input(s): "CKTOTAL", "CKMB", "CKMBINDEX", "TROPONINI" in the last 72 hours.  BNP: BNP (last 3 results) Recent Labs    11/18/21 1534 01/08/22 0225  BNP 100.5* 125.4*    ProBNP (last 3 results) No results for input(s): "PROBNP" in the last 8760 hours.   D-Dimer No results for input(s): "DDIMER" in the last 72 hours. Hemoglobin A1C No results for input(s): "HGBA1C" in the last 72 hours. Fasting Lipid Panel No results for input(s): "CHOL", "HDL", "LDLCALC", "TRIG", "CHOLHDL", "LDLDIRECT" in the last 72 hours. Thyroid Function Tests No results for input(s): "TSH", "T4TOTAL", "T3FREE", "THYROIDAB" in the last 72 hours.  Invalid input(s): "FREET3"   Other results:  Imaging   No results found.  Medications:     Scheduled Medications:  Chlorhexidine Gluconate Cloth  6 each Topical Daily   digoxin  0.125 mg Oral Daily   enoxaparin (LOVENOX) injection  40 mg Subcutaneous Q24H   furosemide  20  mg Oral Daily   guaiFENesin  1,200 mg Oral BID   levothyroxine  150 mcg Oral QAC breakfast   macitentan  10 mg Oral Daily   magnesium oxide  400 mg Oral BID   sodium chloride flush  10-40 mL Intracatheter Q12H   sodium chloride flush  3 mL Intravenous Q12H   sodium chloride flush  3 mL Intravenous Q12H   tadalafil  40 mg Oral Daily    Infusions:  sodium chloride     sodium chloride 10 mL/hr at 01/14/22 0549    PRN Medications: sodium chloride, acetaminophen **OR** acetaminophen, albuterol, lidocaine, sodium chloride flush, sodium chloride flush   Assessment/Plan   1. Pulmonary hypertension with cor pulmonale: Patient had  severe PAH by 7/23 RHC.  Echo in 2023 showed mildly dilated RV with mild RV systolic dysfunction, PASP was elevated at 86 mmHg.  V/Q scan did not suggest the presence of chronic PEs.  HIV negative.  No known liver disease.  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 Echo in 10/23 showed EF 55-60%, D-shaped septum, moderate RV enlargement with moderate RV dysfunction, PASP 124 mmHg.  I think she is going to need aggressive treatment of her PH via pulmonary vasodilators.  She got pregnant recently but had miscarriage, this has limited treatment to tadalafil.  This admission, low output by co-ox and started on milrinone 0.25. Today, co-ox 62% with CVP2/3, now off milrinone since yesterday. - Nexplanon placed yesterday by OB/GYN.  Started Opsumit 10 mg daily today.   - Continue tadalafil 40 mg daily.  - We will need to try to get her selexipag soon => working on initiation.   - Continue Lasix 20 mg po daily.  - Continue digoxin 0.125, level pending  - RHC scheduled for later today  - Will eventually need a sleep study.  2. SLE: On belimumab.  Followed by Dr. Lenna Gilford. Symptoms not typical of SLE flare, CRP only mildly elevated.  3. ?PNA: She has been on ceftriaxone/azithromycin but PCT < 0.1 and viral panel negative.  WBCs not elevated.  ?If presentation was due to cor pulmonale in setting of fluid shifts while she was pregnant (has now miscarried).  4. DVT prophylaxis: Lovenox.   Earnie Larsson AGACNP-BC  01/14/2022 7:04 AM  Patient seen with NP, agree with the above note.   RHC done today off milrinone:  Hemodynamics (mmHg) RA mean 7 RV 82/13 PA 82/36, mean 54 PCWP mean 10  Oxygen saturations: SVC 60% PA 61% AO 95%  Cardiac Output (Fick) 4.21  Cardiac Index (Fick) 2.77  PVR 10.5 WU  Cardiac Output (Thermo) 4.64 Cardiac Index (Thermo) 3.05 PVR 9.5 WU   She feels good today, no complaints.   General: NAD Neck: No JVD, no thyromegaly or thyroid nodule.  Lungs: Clear to  auscultation bilaterally with normal respiratory effort. CV: Nondisplaced PMI.  Heart regular S1/S2 with loud P2, no S3/S4, no murmur.  No peripheral edema.   Abdomen: Soft, nontender, no hepatosplenomegaly, no distention.  Skin: Intact without lesions or rashes.  Neurologic: Alert and oriented x 3.  Psych: Normal affect. Extremities: No clubbing or cyanosis.  HEENT: Normal.   Patient has now started on Opsumit and tadalafil and is going to start Selexipag soon (has been ordered and should come to her house).  Cardiac index preserved on RHC today, so think we have some room for treating her with oral meds prior to moving to IV therapy.    Continue current Lasix  and digoxin.   I think she is ready to go home today.  We will make close followup.   Loralie Champagne 01/14/2022 1:43 PM

## 2022-01-15 ENCOUNTER — Encounter (HOSPITAL_COMMUNITY): Payer: BLUE CROSS/BLUE SHIELD | Admitting: Cardiology

## 2022-01-21 ENCOUNTER — Ambulatory Visit (HOSPITAL_COMMUNITY)
Admit: 2022-01-21 | Discharge: 2022-01-21 | Disposition: A | Discharge status: Home / Self Care | Payer: BC Managed Care – PPO | Source: Ambulatory Visit | Attending: Cardiology | Admitting: Cardiology

## 2022-01-21 ENCOUNTER — Encounter (HOSPITAL_COMMUNITY): Payer: Self-pay | Admitting: Cardiology

## 2022-01-21 VITALS — BP 94/66 | HR 120 | Wt 116.8 lb

## 2022-01-21 DIAGNOSIS — R0602 Shortness of breath: Secondary | ICD-10-CM | POA: Diagnosis not present

## 2022-01-21 DIAGNOSIS — I2721 Secondary pulmonary arterial hypertension: Secondary | ICD-10-CM | POA: Diagnosis not present

## 2022-01-21 DIAGNOSIS — I272 Pulmonary hypertension, unspecified: Secondary | ICD-10-CM | POA: Diagnosis not present

## 2022-01-21 DIAGNOSIS — Z79899 Other long term (current) drug therapy: Secondary | ICD-10-CM | POA: Insufficient documentation

## 2022-01-21 DIAGNOSIS — M329 Systemic lupus erythematosus, unspecified: Secondary | ICD-10-CM | POA: Insufficient documentation

## 2022-01-21 LAB — BASIC METABOLIC PANEL
Anion gap: 6 (ref 5–15)
BUN: 6 mg/dL (ref 6–20)
CO2: 17 mmol/L — ABNORMAL LOW (ref 22–32)
Calcium: 8 mg/dL — ABNORMAL LOW (ref 8.9–10.3)
Chloride: 108 mmol/L (ref 98–111)
Creatinine, Ser: 0.63 mg/dL (ref 0.44–1.00)
GFR, Estimated: >60 mL/min (ref >60)
Glucose, Bld: 79 mg/dL (ref 70–99)
Potassium: 3.8 mmol/L (ref 3.5–5.1)
Sodium: 131 mmol/L — ABNORMAL LOW (ref 135–145)

## 2022-01-21 LAB — DIGOXIN LEVEL: Digoxin Level: <0.2 ng/mL — ABNORMAL LOW (ref 0.8–2.0)

## 2022-01-21 MED ORDER — DIGOXIN 125 MCG PO TABS: 0.1250 mg | ORAL_TABLET | Freq: Every day | ORAL | 3 refills | Status: DC

## 2022-01-21 MED ORDER — FUROSEMIDE 20 MG PO TABS: 20.0000 mg | ORAL_TABLET | Freq: Every day | ORAL | 3 refills | Status: DC

## 2022-01-21 NOTE — Telephone Encounter (Signed)
Advanced Heart Failure Patient Advocate Encounter  Called and spoke with [facility]. They were holding the referral due to a PA request. PA should not be needed at this time, as there is an active approval on file. Confirmed they had PA approval information on file that was sent with original referral. Representative confirmed $5 co-pay. Stated they will reach out to the patient today to set up shipment.  Relayed information to staff while patient was in clinic.   Charlann Boxer, CPhT

## 2022-01-21 NOTE — Progress Notes (Signed)
PCP: [name], NP Rheumatology: Dr. Lenna Gilford Pulmonary: Dr. Silas Flood Cardiology: Dr. Aundra Dubin  21 y.o. with history of SLE and seronegative rheumatoid arthritis was found to have pulmonary hypertension.  SLE is followed by Dr. Lenna Gilford.  She is on belimumab.  Earlier this year, she developed exertional dyspnea.  Her primary care physician ordered an echo, which showed mildly dilated RV with mild systolic dysfunction and PASP 86.  She was referred to pulmonary for evaluation.  PFTs showed moderate restriction with decreased DLCO. V/Q scan showed no evidence for chronic PE.  RHC in 7/23 showed severe pulmonary arterial hypertension.   Echo in 10/23 showed EF 55-60%, D-shaped septum, moderate RV enlargement with moderate RV dysfunction, PASP 124 mmHg.   Treatment of pulmonary hypertension was delayed somewhat by patient finding herself to be pregnant, but she had a miscarriage.  She has now had Nexplanon for contraception.   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  RHC was done, showing severe pulmonary arterial hypertension with normal filling pressures after diuresis and preserved cardiac output.   She returns for followup of pulmonary hypertension.  She is now taking Opsumit and tadalafil.  She has not yet started selexipag. Her breathing is better than prior to the hospital.  She is short of breath after walking about 200 feet.  No lightheadedness or chest pain. She never picked up Lasix or digoxin after recent hospitalization.   ECG (personally reviewed): sinus tachy 117, right axis deviation, RVH  6 minute walk (9/23): 528 m 6 minute walk (11/23): 335 m  PMH: 1. SLE: ANA 1:2560.   2. Seronegative rheumatoid arthritis.  3. Traumatic SAH: 2022.  4. 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  Echo (2023, [facility]) showed mild RV dilation with mild RV dysfunction, PASP 86 mmHg.  - RHC (7/23): mean RA 6, PA 70/31 mean 46, mean PCWP  2, CI 2.49, PVR 11.6 WU, PAPi 6.5.  - PFTs (7/23): Moderate restriction, moderately decreased DLCO.  - V/Q scan (7/23): No acute or chronic PE.  - Echo (10/23): EF 55-60%, D-shaped septum, moderate RV enlargement with moderate RV dysfunction, PASP 124 mmHg.  - RHC (11/23): mean RA 7, PA 82/36 mean 54, mean PCWP 10, CI 2.77, PVR 10.5 WU 5. Hypothryroidism  Social History   Socioeconomic History   Marital status: Single    Spouse name: Not on file   Number of children: Not on file   Years of education: Not on file   Highest education level: Not on file  Occupational History   Not on file  Tobacco Use   Smoking status: Never   Smokeless tobacco: Never  Vaping Use   Vaping Use: Never used  Substance and Sexual Activity   Alcohol use: Not Currently   Drug use: Never   Sexual activity: Not on file  Other Topics Concern   Not on file  Social History Narrative   ** Merged History Encounter **       Social Determinants of Health   Financial Resource Strain: Not on file  Food Insecurity: Not on file  Transportation Needs: Not on file  Physical Activity: Not on file  Stress: Not on file  Social Connections: Not on file  Intimate Partner Violence: Not on file   Family History  Problem Relation Age of Onset   Graves' disease Mother    Healthy Father    Diabetes Maternal Grandfather    ROS: All systems reviewed and negative except as  per HPI.   Current Outpatient Medications  Medication Sig Dispense Refill   b complex vitamins capsule Take 1 capsule by mouth daily.     Belimumab (BENLYSTA) 200 MG/ML SOAJ Inject 200 mg into the skin every Sunday.     cholecalciferol (VITAMIN D3) 25 MCG (1000 UNIT) tablet Take 1,000 Units by mouth daily.     levothyroxine (SYNTHROID) 150 MCG tablet Take 150 mcg by mouth daily before breakfast.     macitentan (OPSUMIT) 10 MG tablet Take 10 mg by mouth daily.     tadalafil (CIALIS) 20 MG tablet Take 40 mg by mouth daily.     digoxin (LANOXIN)  0.125 MG tablet Take 1 tablet (0.125 mg total) by mouth daily. 90 tablet 3   furosemide (LASIX) 20 MG tablet Take 1 tablet (20 mg total) by mouth daily. 90 tablet 3   No current facility-administered medications for this encounter.   BP 94/66   Pulse (!) 120   Wt 53 kg (116 lb 12.8 oz)   SpO2 98%   BMI 21.36 kg/m  General: NAD Neck: JVP 8-9 cm, no thyromegaly or thyroid nodule.  Lungs: Clear to auscultation bilaterally with normal respiratory effort. CV: Nondisplaced PMI.  Heart regular S1/S2 with loud P2, no S3/S4, no murmur.  No peripheral edema.  No carotid bruit.  Normal pedal pulses.  Abdomen: Soft, nontender, no hepatosplenomegaly, no distention.  Skin: Intact without lesions or rashes.  Neurologic: Alert and oriented x 3.  Psych: Normal affect. Extremities: No clubbing or cyanosis.  HEENT: Normal.   Assessment/Plan: 1. Pulmonary hypertension: Patient had severe PAH by 7/23 RHC.  Echo in 2023 showed mildly dilated RV with mild RV systolic dysfunction, PASP was elevated at 86 mmHg.  V/Q scan did not suggest the presence of chronic PEs.  HIV negative.  No known liver disease.  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 Echo in 10/23 showed EF 55-60%, D-shaped septum, moderate RV enlargement with moderate RV dysfunction, PASP 124 mmHg.  She was admitted with cor pulmonale in 11/23 (had not started [place] meds due to pregnancy, had miscarriage) and required milrinone and diuresis.  RHC after diuresis and weaning off milrinone and on tadalafil and Opsumit showed normal filling pressures and preserved cardiac output but severe PAH.  She is going to need aggressive treatment of her PH via pulmonary vasodilators.  I think we have some time to trial oral therapy given preserved cardiac output.  6 minute walk, of note, was less today.  - She has had Nexplanon for contraception.  - Continue Opsumit   - Continue tadalafil 40 mg daily.  - We are working to get her on selexipag.   - She will  need a sleep study.  2. SLE: On belimumab.  Followed by Dr. Lenna Gilford.   Followup in 6 wks   Loralie Champagne 01/21/2022

## 2022-01-21 NOTE — Progress Notes (Signed)
6 Min Walk Test Completed  Pt ambulated 335.28 meters O2 Sat ranged 97-86 on room air HR ranged 97-116

## 2022-01-21 NOTE — Patient Instructions (Signed)
Please pick up your medications and start taking them.  Labs done today, your results will be available in MyChart, we will contact you for abnormal readings.  Your physician recommends that you schedule a follow-up appointment as scheduled   If you have any questions or concerns before your next appointment please send [place] a message through [place] or call our office at [phone].    TO LEAVE A MESSAGE FOR THE NURSE SELECT OPTION 2, PLEASE LEAVE A MESSAGE INCLUDING: YOUR NAME DATE OF BIRTH CALL BACK NUMBER REASON FOR CALL**this is important as we prioritize the call backs  YOU WILL RECEIVE A CALL BACK THE SAME DAY AS LONG AS YOU CALL BEFORE 4:00 PM  At the [facility], you and your health needs are our priority. As part of our continuing mission to provide you with exceptional heart care, we have created designated Provider Care Teams. These Care Teams include your primary Cardiologist (physician) and Advanced Practice Providers (APPs- Physician Assistants and Nurse Practitioners) who all work together to provide you with the care you need, when you need it.   You may see any of the following providers on your designated Care Team at your next follow up: Dr Glori Bickers Dr Loralie Champagne Dr. Roxana Hires, NP Lyda Jester, [place] [facility] [address], [place] Forestine Na, NP Audry Riles, PharmD   Please be sure to bring in all your medications bottles to every appointment.

## 2022-01-25 NOTE — Telephone Encounter (Signed)
Advanced Heart Failure Patient Advocate Encounter  [facility] to check the status of the patient's application. Representative stated that the referral was being delayed due to PA requirement. Went over current approved PAs The 1600 mcg tablet is the only strength requiring a PA and is not needed at this time. The representative stated that 3 outbound call attempts were made and the patient requested a call back. Called and spoke with the patient. She states that [facility] told her that PAs were the reason that the referral was being held up and they would call her back once resvolved.   Advised the patient to call [facility] back since that issue is resolved. The patient called back and now [facility] is stating that they are not sure which strength to start the patient with. The starter pack is what is indicated on the referral orders. [facility]) will call and confirm. Afterwards, the patient and I will conference call [facility] in the hopes of getting a shipment placed.

## 2022-01-26 ENCOUNTER — Telehealth (HOSPITAL_COMMUNITY): Payer: Self-pay | Admitting: Pharmacy Technician

## 2022-01-26 ENCOUNTER — Other Ambulatory Visit (HOSPITAL_COMMUNITY): Payer: Self-pay

## 2022-01-26 NOTE — Telephone Encounter (Addendum)
Patient Advocate Encounter   Received notification from [facility] that prior authorization for Uptravi 28mg (#240 for 30 days) is required.   Exception submitted via fax to [phone] Phone number to check status, [phone]  Status is pending   Will continue to follow.

## 2022-01-26 NOTE — Telephone Encounter (Signed)
Patient Advocate Encounter   Received notification from [place] (new insurance) that prior authorization for Opsumit is required.   PA submitted on CoverMyMeds Key [id number] Status is pending   Will continue to follow.

## 2022-01-29 ENCOUNTER — Other Ambulatory Visit (HOSPITAL_COMMUNITY): Payer: Self-pay

## 2022-01-29 NOTE — Telephone Encounter (Signed)
Advanced Heart Failure Patient Advocate Encounter  PA was cancelled without reason. Called [employer] to get more information. There was apparently another PA already in progress that has been approved.   Key [id number]  Effective dates 01/25/22-01/24/23  [facility]ed [facility] to provide approval information. They already have a paid claim for $150. They have reached out via an automated line twice today. Called and spoke with the patient. Provided [facility] to [facility]. Advised her to call today to get shipment set up. She is aware that we are still working to resolve [place] issues.   Charlann Boxer, CPhT

## 2022-02-01 ENCOUNTER — Other Ambulatory Visit (HOSPITAL_COMMUNITY): Payer: Self-pay

## 2022-02-01 MED ORDER — TADALAFIL 20 MG PO TABS
40.0000 mg | ORAL_TABLET | Freq: Every day | ORAL | 0 refills | Status: DC
  Filled 2022-02-01: qty 60, 30d supply, fill #0

## 2022-02-01 NOTE — Progress Notes (Signed)
PCP: [name], NP Rheumatology: Dr. Lenna Gilford Pulmonary: Dr. Silas Flood Cardiology: Dr. Aundra Dubin  21 y.o. with history of SLE and seronegative rheumatoid arthritis was found to have pulmonary hypertension.  SLE is followed by Dr. Lenna Gilford.  She is on belimumab.  Earlier this year, she developed exertional dyspnea.  Her primary care physician ordered an echo, which showed mildly dilated RV with mild systolic dysfunction and PASP 86.  She was referred to pulmonary for evaluation.  PFTs showed moderate restriction with decreased DLCO. V/Q scan showed no evidence for chronic PE.  RHC in 7/23 showed severe pulmonary arterial hypertension.   Echo in 10/23 showed EF 55-60%, D-shaped septum, moderate RV enlargement with moderate RV dysfunction, PASP 124 mmHg.   Treatment of pulmonary hypertension was delayed somewhat by patient finding herself to be pregnant, but she had a miscarriage.  She has now had Nexplanon for contraception.   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  RHC was done, showing severe pulmonary arterial hypertension with normal filling pressures after diuresis and preserved cardiac output.   She returns for followup of pulmonary hypertension.  She is now taking Opsumit and tadalafil.  She has not yet started selexipag. Her breathing is better than prior to the hospital.  She is short of breath after walking about 200 feet.  No lightheadedness or chest pain. She never picked up Lasix or digoxin after recent hospitalization.   ECG (personally reviewed): sinus tachy 117, right axis deviation, RVH  6 minute walk (9/23): 528 m 6 minute walk (11/23): 335 m  PMH: 1. SLE: ANA 1:2560.   2. Seronegative rheumatoid arthritis.  3. Traumatic SAH: 2022.  4. 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  Echo (2023, [facility]) showed mild RV dilation with mild RV dysfunction, PASP 86 mmHg.  - RHC (7/23): mean RA 6, PA 70/31 mean 46, mean PCWP  2, CI 2.49, PVR 11.6 WU, PAPi 6.5.  - PFTs (7/23): Moderate restriction, moderately decreased DLCO.  - V/Q scan (7/23): No acute or chronic PE.  - Echo (10/23): EF 55-60%, D-shaped septum, moderate RV enlargement with moderate RV dysfunction, PASP 124 mmHg.  - RHC (11/23): mean RA 7, PA 82/36 mean 54, mean PCWP 10, CI 2.77, PVR 10.5 WU 5. Hypothryroidism  Social History   Socioeconomic History   Marital status: Single    Spouse name: Not on file   Number of children: Not on file   Years of education: Not on file   Highest education level: Not on file  Occupational History   Not on file  Tobacco Use   Smoking status: Never   Smokeless tobacco: Never  Vaping Use   Vaping Use: Never used  Substance and Sexual Activity   Alcohol use: Not Currently   Drug use: Never   Sexual activity: Not on file  Other Topics Concern   Not on file  Social History Narrative   ** Merged History Encounter **       Social Determinants of Health   Financial Resource Strain: Not on file  Food Insecurity: Not on file  Transportation Needs: Not on file  Physical Activity: Not on file  Stress: Not on file  Social Connections: Not on file  Intimate Partner Violence: Not on file   Family History  Problem Relation Age of Onset   Graves' disease Mother    Healthy Father    Diabetes Maternal Grandfather    ROS: All systems reviewed and negative except as  per HPI.   Current Outpatient Medications  Medication Sig Dispense Refill   b complex vitamins capsule Take 1 capsule by mouth daily.     Belimumab (BENLYSTA) 200 MG/ML SOAJ Inject 200 mg into the skin every Sunday.     cholecalciferol (VITAMIN D3) 25 MCG (1000 UNIT) tablet Take 1,000 Units by mouth daily.     digoxin (LANOXIN) 0.125 MG tablet Take 1 tablet (0.125 mg total) by mouth daily. 90 tablet 3   furosemide (LASIX) 20 MG tablet Take 1 tablet (20 mg total) by mouth daily. 90 tablet 3   levothyroxine (SYNTHROID) 150 MCG tablet Take 150 mcg  by mouth daily before breakfast.     macitentan (OPSUMIT) 10 MG tablet Take 10 mg by mouth daily.     tadalafil (CIALIS) 20 MG tablet Take 40 mg by mouth daily.     No current facility-administered medications for this visit.   There were no vitals taken for this visit. General: NAD Neck: JVP 8-9 cm, no thyromegaly or thyroid nodule.  Lungs: Clear to auscultation bilaterally with normal respiratory effort. CV: Nondisplaced PMI.  Heart regular S1/S2 with loud P2, no S3/S4, no murmur.  No peripheral edema.  No carotid bruit.  Normal pedal pulses.  Abdomen: Soft, nontender, no hepatosplenomegaly, no distention.  Skin: Intact without lesions or rashes.  Neurologic: Alert and oriented x 3.  Psych: Normal affect. Extremities: No clubbing or cyanosis.  HEENT: Normal.   Assessment/Plan: 1. Pulmonary hypertension: Patient had severe PAH by 7/23 RHC.  Echo in 2023 showed mildly dilated RV with mild RV systolic dysfunction, PASP was elevated at 86 mmHg.  V/Q scan did not suggest the presence of chronic PEs.  HIV negative.  No known liver disease.  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 Echo in 10/23 showed EF 55-60%, D-shaped septum, moderate RV enlargement with moderate RV dysfunction, PASP 124 mmHg.  She was admitted with cor pulmonale in 11/23 (had not started [place] meds due to pregnancy, had miscarriage) and required milrinone and diuresis.  RHC after diuresis and weaning off milrinone and on tadalafil and Opsumit showed normal filling pressures and preserved cardiac output but severe PAH.  She is going to need aggressive treatment of her PH via pulmonary vasodilators.  I think we have some time to trial oral therapy given preserved cardiac output.  6 minute walk, of note, was less today.  - She has had Nexplanon for contraception.  - Continue Opsumit   - Continue tadalafil 40 mg daily.  - We are working to get her on selexipag.   - She will need a sleep study.  2. SLE: On belimumab.   Followed by Dr. Lenna Gilford.   Followup in 6 wks   Emily 02/01/2022

## 2022-02-02 ENCOUNTER — Encounter (HOSPITAL_COMMUNITY): Payer: Self-pay

## 2022-02-02 ENCOUNTER — Other Ambulatory Visit (HOSPITAL_COMMUNITY): Payer: Self-pay

## 2022-02-02 ENCOUNTER — Ambulatory Visit (HOSPITAL_COMMUNITY)
Admit: 2022-02-02 | Discharge: 2022-02-02 | Disposition: A | Discharge status: Home / Self Care | Payer: BC Managed Care – PPO | Attending: Family Medicine | Admitting: Family Medicine

## 2022-02-02 VITALS — BP 108/72 | Ht 62.0 in | Wt 114.0 lb

## 2022-02-02 DIAGNOSIS — M329 Systemic lupus erythematosus, unspecified: Secondary | ICD-10-CM

## 2022-02-02 DIAGNOSIS — I272 Pulmonary hypertension, unspecified: Secondary | ICD-10-CM | POA: Diagnosis present

## 2022-02-02 DIAGNOSIS — I2781 Cor pulmonale (chronic): Secondary | ICD-10-CM | POA: Insufficient documentation

## 2022-02-02 DIAGNOSIS — O039 Complete or unspecified spontaneous abortion without complication: Secondary | ICD-10-CM | POA: Diagnosis not present

## 2022-02-02 DIAGNOSIS — M0600 Rheumatoid arthritis without rheumatoid factor, unspecified site: Secondary | ICD-10-CM | POA: Diagnosis not present

## 2022-02-02 DIAGNOSIS — R0602 Shortness of breath: Secondary | ICD-10-CM | POA: Insufficient documentation

## 2022-02-02 DIAGNOSIS — I2721 Secondary pulmonary arterial hypertension: Secondary | ICD-10-CM | POA: Diagnosis not present

## 2022-02-02 DIAGNOSIS — R0600 Dyspnea, unspecified: Secondary | ICD-10-CM | POA: Insufficient documentation

## 2022-02-02 DIAGNOSIS — Z79899 Other long term (current) drug therapy: Secondary | ICD-10-CM | POA: Diagnosis not present

## 2022-02-02 LAB — BASIC METABOLIC PANEL
Anion gap: 7 (ref 5–15)
BUN: 6 mg/dL (ref 6–20)
CO2: 21 mmol/L — ABNORMAL LOW (ref 22–32)
Calcium: 8.1 mg/dL — ABNORMAL LOW (ref 8.9–10.3)
Chloride: 106 mmol/L (ref 98–111)
Creatinine, Ser: 0.57 mg/dL (ref 0.44–1.00)
GFR, Estimated: >60 mL/min (ref >60)
Glucose, Bld: 74 mg/dL (ref 70–99)
Potassium: 3.9 mmol/L (ref 3.5–5.1)
Sodium: 134 mmol/L — ABNORMAL LOW (ref 135–145)

## 2022-02-02 LAB — BRAIN NATRIURETIC PEPTIDE: B Natriuretic Peptide: 198.8 pg/mL — ABNORMAL HIGH (ref 0.0–100.0)

## 2022-02-02 NOTE — Telephone Encounter (Signed)
Advanced Heart Failure Patient Advocate Encounter  The patient and [facility] to ensure she receives Opsumit on Friday, 11/24. Patient was in office today, we [facility]. The representative stated that the PAP program is down, which covers the patient's co-pay. Inquired into when the system would be back up, the representative was not sure. I was able to provide the PAP card information to put on file since the representative stated it was not. The patient has provided this information several times. The representative stated they would have someone reach out to the patient again when the system was back up.   Advised the patient to call [facility] tomorrow if she does not hear from them by the end of the day. The pharmacy will be closed Thursday and would hate for the patient to miss doses due to an error that is not in her control. I will be off the rest of the week. Updated [facility]) where referral stands.   Will follow up next week if no update by end of day.

## 2022-02-02 NOTE — Telephone Encounter (Signed)
Advanced Heart Failure Patient Advocate Encounter  [employer] requested additional information to support QLE. Sent via fax.   Will follow up.

## 2022-02-02 NOTE — Patient Instructions (Signed)
It was great to see you today! No medication changes are needed at this time.  Labs today We will only contact you if something comes back abnormal or we need to make some changes. Otherwise no news is good news!  Keep follow up as scheduled with Dr Aundra Dubin   Do the following things EVERYDAY: Weigh yourself in the morning before breakfast. Write it down and keep it in a log. Take your medicines as prescribed Eat low salt foods--Limit salt (sodium) to 2000 mg per day.  Stay as active as you can everyday Limit all fluids for the day to less than 2 liters   At the [facility], you and your health needs are our priority. As part of our continuing mission to provide you with exceptional heart care, we have created designated Provider Care Teams. These Care Teams include your primary Cardiologist (physician) and Advanced Practice Providers (APPs- Physician Assistants and Nurse Practitioners) who all work together to provide you with the care you need, when you need it.   You may see any of the following providers on your designated Care Team at your next follow up: Dr Glori Bickers Dr Loralie Champagne Dr. Roxana Hires, NP Lyda Jester, [place] [facility] [place], [place] Forestine Na, NP Audry Riles, PharmD   Please be sure to bring in all your medications bottles to every appointment.   If you have any questions or concerns before your next appointment please send [place] a message through Industry or call our office at [phone].    TO LEAVE A MESSAGE FOR THE NURSE SELECT OPTION 2, PLEASE LEAVE A MESSAGE INCLUDING: YOUR NAME DATE OF BIRTH CALL BACK NUMBER REASON FOR CALL**this is important as we prioritize the call backs  YOU WILL RECEIVE A CALL BACK THE SAME DAY AS LONG AS YOU CALL BEFORE 4:00 PM

## 2022-02-02 NOTE — Telephone Encounter (Signed)
Advanced Heart Failure Patient Advocate Encounter  Called [facility] to see if [facility] about [facility]. Patient stated she had to give them the CAP card information again, (we provided that information this morning). The representative stated that she would received the medication on Friday. The patient plans to call them again tomorrow to confirm since there have been so many issues up to this point.   I will be off the rest of the week and will follow up with the patient on Monday.

## 2022-02-02 NOTE — Progress Notes (Signed)
6 minute walk performed on pt. Pt walked 274.32 meters heart rate ranged from 107 to 133 beats per minute. With oxygen saturation on room air ranging from 100% to 87%

## 2022-02-03 ENCOUNTER — Encounter: Payer: Self-pay | Admitting: Internal Medicine

## 2022-02-03 ENCOUNTER — Ambulatory Visit: Payer: BC Managed Care – PPO | Admitting: Internal Medicine

## 2022-02-03 VITALS — BP 110/68 | HR 105 | Ht 62.0 in | Wt 112.0 lb

## 2022-02-03 DIAGNOSIS — E063 Autoimmune thyroiditis: Secondary | ICD-10-CM | POA: Diagnosis not present

## 2022-02-03 LAB — TSH: TSH: 0.57 u[IU]/mL (ref 0.35–5.50)

## 2022-02-03 NOTE — Progress Notes (Signed)
Name: Jessica Frazier  MRN/ DOB: [id number], 12-14-2000    Age/ Sex: 21 y.o., female     PCP: [name], NP   Reason for Endocrinology Evaluation: Hypothyroidism     Initial Endocrinology Clinic Visit: 12/12/2019    PATIENT IDENTIFIER: [name] is a 21 y.o., female with a past medical history of hypothyroidism and SLE, Subarachnoid Hge 2022 . She has followed with [facility] since 12/12/2019 for consultative assistance with management of her hypothyroidism.   HISTORICAL SUMMARY:  Pt was noted to have an elevated TSH at 9.7 uIU/mL during an evaluation for severe joint pains and aches in 11/2018 She was started on Lt-4 replacement at the time.   Mother with hyperthyroidism  SUBJECTIVE:    Today (02/03/2022):  [name] is here for a follow up on hypothyroidism.    She was admitted on 01/14/2022 for chest pain, was found to have SIRS She has severe pulmonary hypertension/RV failure   Since her last visit here her TSH has been noted to be high and her PCP has been adjusting the dose and is currently on Levothyroxine 150 mcg daily   Chest pain and Sob have improved  Weight has been trending down   Denies constipation or diarrhea  Denies local neck symptoms  Denies palpitations  Joint pain is improving  She is on Benlysta once weekly   She has a miscarriage, on Nexplanon   Levothyroxine 150 mcg daily      HISTORY:  Past Medical History:  Past Medical History:  Diagnosis Date   Anemia    Phreesia 07/22/2020   Arthritis    Phreesia 07/22/2020   Bell's palsy    Hypothyroidism    Rheumatoid arthritis ([place])    Thyroid disease    Phreesia 07/22/2020   Past Surgical History:  Past Surgical History:  Procedure Laterality Date   RIGHT HEART CATH N/A 10/08/2021   Procedure: RIGHT HEART CATH;  Surgeon: Larey Dresser, MD;  Location: [place] [facility];  Service: Cardiovascular;   Laterality: N/A;   RIGHT HEART CATH N/A 01/14/2022   Procedure: RIGHT HEART CATH;  Surgeon: Larey Dresser, MD;  Location: [place] [facility];  Service: Cardiovascular;  Laterality: N/A;   Social History:  reports that she has never smoked. She has never used smokeless tobacco. She reports that she does not currently use alcohol. She reports that she does not use drugs. Family History:  Family History  Problem Relation Age of Onset   Graves' disease Mother    Healthy Father    Diabetes Maternal Grandfather      HOME MEDICATIONS: Allergies as of 02/03/2022       Reactions   Hydroxychloroquine    Other reaction(s): dyspnea   Other Diarrhea, Other (See Comments)   Omega XL   Rinvoq [upadacitinib] Other (See Comments)   Enlarged the patient's liver- had to come to the ED, 2022        Medication List        Accurate as of February 03, 2022  1:28 PM. If you have any questions, ask your nurse or doctor.          b complex vitamins capsule Take 1 capsule by mouth daily.   Benlysta 200 MG/ML Soaj Generic drug: Belimumab Inject 200 mg into the skin every Sunday.   cholecalciferol 25 MCG (1000 UNIT) tablet Commonly known as: VITAMIN D3 Take 1,000 Units by mouth daily.   digoxin  0.125 MG tablet Commonly known as: LANOXIN Take 1 tablet (0.125 mg total) by mouth daily.   furosemide 20 MG tablet Commonly known as: LASIX Take 1 tablet (20 mg total) by mouth daily.   levothyroxine 150 MCG tablet Commonly known as: SYNTHROID Take 150 mcg by mouth daily before breakfast.   macitentan 10 MG tablet Commonly known as: OPSUMIT Take 10 mg by mouth daily.   tadalafil 20 MG tablet Commonly known as: CIALIS Take 2 tablets (40 mg total) by mouth daily.   Uptravi 1600 MCG Tabs Generic drug: Selexipag Take by mouth.          OBJECTIVE:   PHYSICAL EXAM: VS: BP 110/68 (BP Location: Left Arm, Patient Position: Sitting, Cuff Size: Small)   Pulse (!) 105   Ht '5\' 2"'$   (1.575 m)   Wt 112 lb (50.8 kg)   SpO2 99%   BMI 20.49 kg/m    EXAM: General: Pt appears well and is in NAD  Neck: General: Supple without adenopathy. Thyroid: Thyroid size is prominent.   Lungs: Clear with good BS bilat with no rales, rhonchi, or wheezes  Heart: Auscultation: tachycardic   Abdomen: Normoactive bowel sounds, soft, nontender, without masses or organomegaly palpable  Extremities:  BL LE: No pretibial edema normal ROM and strength.  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 [place] [address] Phone: [phone]  Fax: [phone]   Return to Endocrinology clinic as below: Future Appointments  Date Time Provider [place]  02/10/2022  1:10 PM Darliss Cheney, MD [facility] None  03/16/2022  2:40 PM Larey Dresser, MD [facility] None

## 2022-02-03 NOTE — Telephone Encounter (Signed)
Advanced Heart Failure Patient Advocate Encounter  Prior Authorization for Jessica Frazier has been approved.   Effective: 01/26/2022 to 01/25/2023 Determination letter has been added to patient chart.  Clista Bernhardt, CPhT Rx Patient Advocate Phone: [phone]

## 2022-02-03 NOTE — Patient Instructions (Signed)

## 2022-02-05 ENCOUNTER — Other Ambulatory Visit (HOSPITAL_COMMUNITY): Payer: Self-pay

## 2022-02-08 ENCOUNTER — Other Ambulatory Visit (HOSPITAL_COMMUNITY): Payer: Self-pay

## 2022-02-08 ENCOUNTER — Telehealth (HOSPITAL_COMMUNITY): Payer: Self-pay | Admitting: Pharmacy Technician

## 2022-02-08 ENCOUNTER — Telehealth: Payer: Self-pay | Admitting: *Deleted

## 2022-02-08 ENCOUNTER — Encounter: Payer: Self-pay | Admitting: Internal Medicine

## 2022-02-08 ENCOUNTER — Other Ambulatory Visit (HOSPITAL_COMMUNITY): Payer: Self-pay | Admitting: *Deleted

## 2022-02-08 DIAGNOSIS — I272 Pulmonary hypertension, unspecified: Secondary | ICD-10-CM

## 2022-02-08 MED ORDER — TADALAFIL 20 MG PO TABS: 40.0000 mg | ORAL_TABLET | Freq: Every day | ORAL | 11 refills | Status: DC

## 2022-02-08 MED ORDER — LEVOTHYROXINE SODIUM 150 MCG PO TABS: 150.0000 ug | ORAL_TABLET | Freq: Every day | ORAL | 3 refills | Status: AC

## 2022-02-08 NOTE — Telephone Encounter (Signed)
Advanced Heart Failure Patient Advocate Encounter  Prior Authorization for Tadalafil [facility]) has been submitted and approved.    Reference # [id number] Effective dates: 02/01/22 through 01/31/23  Charlann Boxer, CPhT

## 2022-02-08 NOTE — Telephone Encounter (Signed)
Advanced Heart Failure Patient Advocate Encounter  Quantity limit exception approval sent [facility] via [facility].

## 2022-02-08 NOTE — Telephone Encounter (Signed)
-----   Message from Sueanne Margarita, MD sent at 02/06/2022  7:59 PM EST ----- [place] sleep study has to be repeated due to bad O2 monitor

## 2022-02-09 NOTE — Telephone Encounter (Signed)
Advanced Heart Failure Patient Advocate Encounter  Received notification that patient was scheduled to receive Opsumit on 11/24. Called and left the patient a message to confirm she did receive shipment.   Charlann Boxer, CPhT

## 2022-02-10 ENCOUNTER — Other Ambulatory Visit (HOSPITAL_COMMUNITY)
Admission: RE | Admit: 2022-02-10 | Discharge: 2022-02-10 | Disposition: A | Discharge status: Home / Self Care | Payer: BC Managed Care – PPO | Source: Ambulatory Visit | Attending: Obstetrics and Gynecology | Admitting: Obstetrics and Gynecology

## 2022-02-10 ENCOUNTER — Other Ambulatory Visit (HOSPITAL_COMMUNITY): Payer: Self-pay | Admitting: Cardiology

## 2022-02-10 ENCOUNTER — Encounter: Payer: Self-pay | Admitting: Obstetrics and Gynecology

## 2022-02-10 ENCOUNTER — Ambulatory Visit (INDEPENDENT_AMBULATORY_CARE_PROVIDER_SITE_OTHER): Payer: BC Managed Care – PPO | Admitting: Obstetrics and Gynecology

## 2022-02-10 VITALS — BP 103/54 | HR 103 | Ht 62.0 in | Wt 110.0 lb

## 2022-02-10 DIAGNOSIS — Z01419 Encounter for gynecological examination (general) (routine) without abnormal findings: Secondary | ICD-10-CM

## 2022-02-10 NOTE — Progress Notes (Signed)
ANNUAL EXAM Patient name: Jessica Frazier MRN [id number]  Date of birth: 2000-04-28 Chief Complaint:   Annual Exam  History of Present Illness:   Jessica Frazier is a 21 y.o. G1P0010 female being seen today for a routine annual exam. Has multiple health conditions that have prompted use of teratogenic meds and subsequently had nexplanon put in place to prevent pregnancy. Initially had no bleeding with nexplanon followed by 2 weeks of menses earlier this month, 1 week break, then resumption of bleeding. No pain associated with bleeding. Also notes intermittent discomfort at her nexplanon site- tingling sensation or random brief sharp pain. No surrounding redness, no discharge, no numbness in fingers or shooting pain down arm.    Patient's last menstrual period was 02/07/2022 (exact date).   The pregnancy intention screening data noted above was reviewed. Potential methods of contraception were discussed. The patient elected to proceed with No data recorded.   Last pap never previously done  Last mammogram: n/a Last colonoscopy: n/a     08/08/2020   11:11 AM 07/22/2020    2:38 PM 07/10/2020   10:09 AM  Depression screen PHQ 2/9  Decreased Interest 0 0 0  Down, Depressed, Hopeless 0 0 0  PHQ - 2 Score 0 0 0  Altered sleeping 0 0 1  Tired, decreased energy 1 0 0  Change in appetite 1 0 0  Feeling bad or failure about yourself  0 0 0  Trouble concentrating 0 0 0  Moving slowly or fidgety/restless 0 0 0  Suicidal thoughts 0 0 0  PHQ-9 Score 2 0 1         No data to display           Review of Systems:   Pertinent items are noted in HPI Denies any headaches, blurred vision, fatigue, shortness of breath, chest pain, abdominal pain, abnormal vaginal discharge/itching/odor/irritation, problems with periods, bowel movements, urination, or intercourse unless otherwise stated above. Pertinent History Reviewed:  Reviewed past medical,surgical,  social and family history.  Reviewed problem list, medications and allergies. Physical Assessment:   Vitals:   02/10/22 1312  BP: (!) 103/54  Pulse: (!) 103  Weight: 110 lb (49.9 kg)  Height: '5\' 2"'$  (1.575 m)  Body mass index is 20.12 kg/m.        Physical Examination:   General appearance - well appearing, and in no distress  Mental status - alert, oriented to person, place, and time  Psych:  She has a normal mood and affect  Skin - warm and dry, normal color, no suspicious lesions noted. Left arm with nexplanon in place, no erythema, no surrounding tenderness, mild tenderness  Chest - effort normal, all lung fields clear to auscultation bilaterally  Heart - normal rate and regular rhythm  Breasts - breasts appear normal, no suspicious masses, no skin or nipple changes or  axillary nodes  Abdomen - soft, nontender, nondistended, no masses or organomegaly  Pelvic -  VULVA: normal appearing vulva with no masses, tenderness or lesions   VAGINA: normal appearing vagina with normal color and discharge, no lesions   CERVIX: normal appearing cervix without discharge or lesions, no CMT  Thin prep pap is done with reflex HR HPV cotesting  UTERUS: uterus is felt to be normal size, shape, consistency and nontender   ADNEXA: No adnexal masses or tenderness noted.  Extremities:  No swelling or varicosities noted  Chaperone present for exam  No results found for this or  any previous visit (from the past 24 hour(s)).    Assessment & Plan:   1. Well woman exam with routine gynecological exam Completed routine screening. Unable to take NSAIDS or estrogen due to co morbidities. Offered additional po progestin, observation and removal - will proceed with observation. Follow up pap per ASCCP guidelines.  - Cytology - PAP( [facility])   No orders of the defined types were placed in this encounter.   Meds: No orders of the defined types were placed in this encounter.   Follow-up: Return  for Annual GYN.  Darliss Cheney, MD 02/10/2022 5:50 PM

## 2022-02-15 NOTE — Telephone Encounter (Signed)
Advanced Heart Failure Patient Advocate Encounter  Received phone call from [place] [facility]. Confirmed starting dose. She will see the patient on Wednesday to start Uptravi.  Charlann Boxer, CPhT

## 2022-02-17 LAB — CYTOLOGY - PAP: Diagnosis: NEGATIVE

## 2022-02-17 NOTE — Telephone Encounter (Signed)
Prior Authorization for [facility] sent to [facility] via web portal. Tracking Number Order ID: [id number] In Progress Anticipated Determination Date:02/19/2022.

## 2022-02-22 NOTE — Telephone Encounter (Signed)
Advanced Heart Failure Patient Advocate Encounter  Elmyra Ricks [facility] left message stating the patient started Uptravi on 12/06 and has been tolerating it. Plans to have the patient titrate to 400 mcg bid in the coming week.  Charlann Boxer, CPhT

## 2022-03-03 NOTE — Telephone Encounter (Signed)
I will forward to [facility] as they ordered the sleep study.

## 2022-03-03 NOTE — Telephone Encounter (Signed)
Prior Authorization for [facility] sent to [facility] via web portal. Tracking Number -. Order [id number] Non-Authorized

## 2022-03-09 NOTE — Telephone Encounter (Signed)
Will discuss w/Dr Aundra Dubin and pt at [facility] 03/16/22

## 2022-03-11 NOTE — Telephone Encounter (Signed)
Per Dr Radford Pax the test needs to be resubmitted with the dx pulmonary hypertension. Order has ben placed.

## 2022-03-16 ENCOUNTER — Encounter (HOSPITAL_COMMUNITY): Payer: Self-pay | Admitting: Cardiology

## 2022-03-16 ENCOUNTER — Ambulatory Visit (HOSPITAL_COMMUNITY)
Admission: RE | Admit: 2022-03-16 | Discharge: 2022-03-16 | Disposition: A | Discharge status: Home / Self Care | Payer: BC Managed Care – PPO | Source: Ambulatory Visit | Attending: Cardiology | Admitting: Cardiology

## 2022-03-16 VITALS — BP 100/60 | HR 115 | Wt 104.6 lb

## 2022-03-16 DIAGNOSIS — Z8759 Personal history of other complications of pregnancy, childbirth and the puerperium: Secondary | ICD-10-CM | POA: Diagnosis not present

## 2022-03-16 DIAGNOSIS — M329 Systemic lupus erythematosus, unspecified: Secondary | ICD-10-CM | POA: Diagnosis not present

## 2022-03-16 DIAGNOSIS — I2721 Secondary pulmonary arterial hypertension: Secondary | ICD-10-CM | POA: Diagnosis not present

## 2022-03-16 DIAGNOSIS — Z79899 Other long term (current) drug therapy: Secondary | ICD-10-CM | POA: Insufficient documentation

## 2022-03-16 DIAGNOSIS — J029 Acute pharyngitis, unspecified: Secondary | ICD-10-CM | POA: Insufficient documentation

## 2022-03-16 DIAGNOSIS — I272 Pulmonary hypertension, unspecified: Secondary | ICD-10-CM

## 2022-03-16 LAB — BASIC METABOLIC PANEL
Anion gap: 8 (ref 5–15)
BUN: 7 mg/dL (ref 6–20)
CO2: 22 mmol/L (ref 22–32)
Calcium: 7.8 mg/dL — ABNORMAL LOW (ref 8.9–10.3)
Chloride: 98 mmol/L (ref 98–111)
Creatinine, Ser: 0.56 mg/dL (ref 0.44–1.00)
GFR, Estimated: >60 mL/min (ref >60)
Glucose, Bld: 95 mg/dL (ref 70–99)
Potassium: 3.5 mmol/L (ref 3.5–5.1)
Sodium: 128 mmol/L — ABNORMAL LOW (ref 135–145)

## 2022-03-16 LAB — BRAIN NATRIURETIC PEPTIDE: B Natriuretic Peptide: 234.3 pg/mL — ABNORMAL HIGH (ref 0.0–100.0)

## 2022-03-16 LAB — DIGOXIN LEVEL: Digoxin Level: 1.4 ng/mL (ref 0.8–2.0)

## 2022-03-16 NOTE — Progress Notes (Signed)
PCP: [name], NP Rheumatology: Dr. Lenna Gilford Pulmonary: Dr. Silas Flood Cardiology: Dr. Aundra Dubin  22 y.o. with history of SLE and seronegative rheumatoid arthritis was found to have pulmonary hypertension.  SLE is followed by Dr. Lenna Gilford.  She is on belimumab.  Earlier this year, she developed exertional dyspnea.  Her primary care physician ordered an echo, which showed mildly dilated RV with mild systolic dysfunction and PASP 86.  She was referred to pulmonary for evaluation.  PFTs showed moderate restriction with decreased DLCO. V/Q scan showed no evidence for chronic PE.  RHC in 7/23 showed severe pulmonary arterial hypertension.   Echo in 10/23 showed EF 55-60%, D-shaped septum, moderate RV enlargement with moderate RV dysfunction, PASP 124 mmHg.   Treatment of pulmonary hypertension was delayed somewhat by patient finding herself to be pregnant, but she had a miscarriage.  She has now had Nexplanon for contraception.   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  RHC was done, showing severe pulmonary arterial hypertension with normal filling pressures after diuresis and preserved cardiac output.   Today she returns for pulmonary hypertension followup with her mother. She has started selexipag and has titrated up to 800 mcg bid.  She feels like her breathing is better with this.  Since increasing to 800 mcg, she has noted nausea/vomiting.  She also has had a sore throat and felt "sick."  Additionally, she has a lupus flare with rash on her thighs.  She is due to increase selexipag again tomorrow.  She is fatigued walking up stairs. No dyspnea walking on flat ground.  No chest pain.  No lightheadedness/syncope.    ECG (personally reviewed): sinus tachy 115, right axis deviation, RVH, nonspecific T wave inversions.   6 minute walk (9/23): 528 m 6 minute walk (11/23): 335 m 6 minute walk (02/02/22): 93 m  PMH: 1. SLE: ANA 1:2560.   2. Seronegative  rheumatoid arthritis.  3. Traumatic SAH: 2022.  4. 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  Echo (2023, [facility]) showed mild RV dilation with mild RV dysfunction, PASP 86 mmHg.  - RHC (7/23): mean RA 6, PA 70/31 mean 46, mean PCWP 2, CI 2.49, PVR 11.6 WU, PAPi 6.5.  - PFTs (7/23): Moderate restriction, moderately decreased DLCO.  - V/Q scan (7/23): No acute or chronic PE.  - Echo (10/23): EF 55-60%, D-shaped septum, moderate RV enlargement with moderate RV dysfunction, PASP 124 mmHg.  - RHC (11/23): mean RA 7, PA 82/36 mean 54, mean PCWP 10, CI 2.77, PVR 10.5 WU 5. Hypothryroidism  Social History   Socioeconomic History   Marital status: Single    Spouse name: Not on file   Number of children: Not on file   Years of education: Not on file   Highest education level: Not on file  Occupational History   Not on file  Tobacco Use   Smoking status: Never   Smokeless tobacco: Never  Vaping Use   Vaping Use: Never used  Substance and Sexual Activity   Alcohol use: Not Currently   Drug use: Never   Sexual activity: Not Currently  Other Topics Concern   Not on file  Social History Narrative   ** Merged History Encounter **       Social Determinants of Health   Financial Resource Strain: Not on file  Food Insecurity: Not on file  Transportation Needs: Not on file  Physical Activity: Not on file  Stress: Not on file  Social Connections:  Not on file  Intimate Partner Violence: Not on file   Family History  Problem Relation Age of Onset   Graves' disease Mother    Healthy Father    Diabetes Maternal Grandfather    ROS: All systems reviewed and negative except as per HPI.   Current Outpatient Medications  Medication Sig Dispense Refill   Belimumab 200 MG/ML SOAJ Inject 200 mg into the skin once a week. Q Wednesday     digoxin (LANOXIN) 0.125 MG tablet Take 1 tablet (0.125 mg total) by mouth daily. 90 tablet 3   furosemide (LASIX) 20 MG tablet Take 1 tablet  (20 mg total) by mouth daily. 90 tablet 3   levothyroxine (SYNTHROID) 150 MCG tablet Take 1 tablet (150 mcg total) by mouth daily before breakfast. 90 tablet 3   macitentan (OPSUMIT) 10 MG tablet Take 10 mg by mouth daily.     Selexipag (UPTRAVI) 800 MCG TABS Take 800 mcg by mouth 2 (two) times daily.     tadalafil (CIALIS) 20 MG tablet Take 2 tablets (40 mg total) by mouth daily. 60 tablet 11   No current facility-administered medications for this encounter.   Wt Readings from Last 3 Encounters:  03/16/22 47.4 kg (104 lb 9.6 oz)  02/10/22 49.9 kg (110 lb)  02/03/22 50.8 kg (112 lb)   BP 100/60   Pulse (!) 115   Wt 47.4 kg (104 lb 9.6 oz)   SpO2 95%   BMI 19.13 kg/m  General: NAD Neck: No JVD, no thyromegaly or thyroid nodule.  Lungs: Clear to auscultation bilaterally with normal respiratory effort. CV: Nondisplaced PMI.  Heart mildly tachy, regular S1/S2 with loud P2, no S3/S4, no murmur.  No peripheral edema.  No carotid bruit.  Normal pedal pulses.  Abdomen: Soft, nontender, no hepatosplenomegaly, no distention.  Skin: Intact without lesions or rashes.  Neurologic: Alert and oriented x 3.  Psych: Normal affect. Extremities: No clubbing or cyanosis.  HEENT: Normal.   Assessment/Plan: 1. Pulmonary hypertension: Patient had severe PAH by 7/23 RHC.  Echo in 2023 showed mildly dilated RV with mild RV systolic dysfunction, PASP was elevated at 86 mmHg.  V/Q scan did not suggest the presence of chronic PEs.  HIV negative.  No known liver disease.  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 Echo in 10/23 showed EF 55-60%, D-shaped septum, moderate RV enlargement with moderate RV dysfunction, PASP 124 mmHg.  She was admitted with cor pulmonale in 11/23 (had not started [place] meds due to pregnancy, had miscarriage) and required milrinone and diuresis.  RHC after diuresis and weaning off milrinone and on tadalafil and Opsumit showed normal filling pressures and preserved cardiac output but  severe PAH.  She is going to need aggressive treatment of her PH via pulmonary vasodilators.  I think we have some time to trial oral therapy given preserved cardiac output.  She has recently started on selexipag and titrated up to 800 mcg bid. She feels like her breathing is better but she has nausea/vomiting, sore throat, and feels "sick" today.  Unsure if she has a viral illness or if these are side effects from selexipag.  - I asked her to wait another week before titrating up selexipag to see if symptoms resolve.  Will send instructions to the nurse guiding the titration.  - She has had Nexplanon for contraception.  - Continue Opsumit. - Continue Lasix 20 mg daily. BMET and BNP today. - Continue digoxin 0.125 mg daily. Check dig  level today. - Continue tadalafil 40 mg daily.  - Waiting on read of home sleep study, will see what happened with his as she says she did it a month ago.  - Feels sick today, will defer 6 minute walk until next appt.  2. SLE: On belimumab.  Followed by Dr. Lenna Gilford.   Keep follow up next month with Dr. Aundra Dubin, as scheduled.  Loralie Champagne  03/16/2022

## 2022-03-16 NOTE — Patient Instructions (Signed)
There has been no changes to your medications.  Labs done today, your results will be available in MyChart, we will contact you for abnormal readings.  Your physician recommends that you schedule a follow-up appointment in: 6 weeks   If you have any questions or concerns before your next appointment please send [place] a message through [place] or call our office at [phone].    TO LEAVE A MESSAGE FOR THE NURSE SELECT OPTION 2, PLEASE LEAVE A MESSAGE INCLUDING: YOUR NAME DATE OF BIRTH CALL BACK NUMBER REASON FOR CALL**this is important as we prioritize the call backs  YOU WILL RECEIVE A CALL BACK THE SAME DAY AS LONG AS YOU CALL BEFORE 4:00 PM  At the [facility], you and your health needs are our priority. As part of our continuing mission to provide you with exceptional heart care, we have created designated Provider Care Teams. These Care Teams include your primary Cardiologist (physician) and Advanced Practice Providers (APPs- Physician Assistants and Nurse Practitioners) who all work together to provide you with the care you need, when you need it.   You may see any of the following providers on your designated Care Team at your next follow up: Dr Glori Bickers Dr Loralie Champagne Dr. Roxana Hires, NP Lyda Jester, [place] [facility] [place], [place] Forestine Na, NP Audry Riles, PharmD   Please be sure to bring in all your medications bottles to every appointment.

## 2022-03-17 ENCOUNTER — Telehealth (HOSPITAL_COMMUNITY): Payer: Self-pay | Admitting: Surgery

## 2022-03-17 NOTE — Telephone Encounter (Signed)
Patient called and will return to HF clinic tomorrow to get a new Itamar device and instructions for use in order to complete a second home sleep study.

## 2022-03-18 ENCOUNTER — Ambulatory Visit (HOSPITAL_COMMUNITY)
Admission: RE | Admit: 2022-03-18 | Discharge: 2022-03-18 | Disposition: A | Discharge status: Home / Self Care | Payer: BC Managed Care – PPO | Source: Ambulatory Visit | Attending: Cardiology | Admitting: Cardiology

## 2022-03-18 DIAGNOSIS — R400 Somnolence: Secondary | ICD-10-CM

## 2022-03-18 DIAGNOSIS — I272 Pulmonary hypertension, unspecified: Secondary | ICD-10-CM

## 2022-03-18 NOTE — Progress Notes (Signed)
Patient Name: Jessica Frazier        DOB: 09-Sep-2000      Height:  5'2"   Weight: 104 lb  Office Name:[facility]         Referring Provider:Dalton Richardson Dopp Date: 03/18/2022  STOP BANG RISK ASSESSMENT S (snore) Have you been told that you snore?     NO   T (tired) Are you often tired, fatigued, or sleepy during the day?   YES  O (obstruction) Do you stop breathing, choke, or gasp during sleep? NO   P (pressure) Do you have or are you being treated for high blood pressure? NO   B (BMI) Is your body index greater than 35 kg/m? NO   A (age) Are you 70 years old or older? NO   N (neck) Do you have a neck circumference greater than 16 inches?   NO   G (gender) Are you a female? NO   TOTAL STOP/BANG "YES" ANSWERS                                                                        For Office Use Only              Procedure Order Form    YES to 3+ Stop Bang questions OR two clinical symptoms - patient qualifies for WatchPAT (CPT 95800)             Clinical Notes: Will consult Sleep Specialist and refer for management of therapy due to patient increased risk of Sleep Apnea. Ordering a sleep study due to the following two clinical symptoms: Excessive daytime sleepiness G47.10 / Unrefreshed by sleep G47.8  I understand that I am proceeding with a home sleep apnea test as ordered by my treating physician. I understand that untreated sleep apnea is a serious cardiovascular risk factor and it is my responsibility to perform the test and seek management for sleep apnea. I will be contacted with the results and be managed for sleep apnea by a local sleep physician. I will be receiving equipment and further instructions from [facility]. I shall promptly ship back the equipment via the included mailing label. I understand my insurance will be billed for the test and as the patient I am responsible for any insurance related out-of-pocket costs incurred. I  have been provided with written instructions and can call for additional video or telephonic instruction, with 24-hour availability of qualified personnel to answer any questions: Patient Help Desk [phone].  Patient Signature ______________________________________________________   Date______________________ Patient Telemedicine Verbal Consent

## 2022-03-23 ENCOUNTER — Other Ambulatory Visit (HOSPITAL_COMMUNITY): Payer: Self-pay

## 2022-03-23 MED ORDER — UPTRAVI 800 MCG PO TABS: 800.0000 ug | ORAL_TABLET | Freq: Two times a day (BID) | ORAL | 11 refills | Status: AC

## 2022-03-25 ENCOUNTER — Encounter (INDEPENDENT_AMBULATORY_CARE_PROVIDER_SITE_OTHER): Payer: BC Managed Care – PPO | Admitting: Cardiology

## 2022-03-25 DIAGNOSIS — G4733 Obstructive sleep apnea (adult) (pediatric): Secondary | ICD-10-CM | POA: Diagnosis not present

## 2022-03-26 ENCOUNTER — Ambulatory Visit: Payer: BC Managed Care – PPO | Attending: Cardiology

## 2022-03-26 ENCOUNTER — Other Ambulatory Visit (HOSPITAL_COMMUNITY): Payer: Self-pay

## 2022-03-26 DIAGNOSIS — R400 Somnolence: Secondary | ICD-10-CM

## 2022-03-26 DIAGNOSIS — I272 Pulmonary hypertension, unspecified: Secondary | ICD-10-CM

## 2022-03-26 NOTE — Procedures (Signed)
SLEEP STUDY REPORT Patient Information Study Date: 03/20/2022 Patient Name: Jessica Frazier Patient ID: [id number] Birth Date: 02-19-2001 Age: 22 Gender: Female BMI: 21.1 (W=115 lb, H=5' 2'') Referring Physician: Allena Katz, NP  TEST DESCRIPTION: Home sleep apnea testing was completed using the WatchPat, a Type 1 device, utilizing peripheral arterial tonometry (PAT), chest movement, actigraphy, pulse oximetry, pulse rate, body position and snore. AHI was calculated with apnea and hypopnea using valid sleep time as the denominator. RDI includes apneas, hypopneas, and RERAs. The data acquired and the scoring of sleep and all associated events were performed in accordance with the recommended standards and specifications as outlined in the AASM Manual for the Scoring of Sleep and Associated Events 2.2.0 (2015).   FINDINGS:   1. Mild Obstructive Sleep Apnea with AHI 8.4/hr.   2. No Central Sleep Apnea with pAHIc 0.2/hr.   3. Oxygen desaturations as low as 82%.   4. Mild snoring was present. O2 sats were < 88% for 46.9 min.   5. Total sleep time was 5 hrs and 34 min.   6. 13.3% of total sleep time was spent in REM sleep.   7. Normal sleep onset latency at 23 min.   8. Normal REM sleep onset latency at 87 min.   9. Total awakenings were 13.  10. Arrhythmia detection:  None.  DIAGNOSIS: Mild Obstructive Sleep Apnea (G47.33) Nocturnal Hypoxemia  RECOMMENDATIONS:   1.  Clinical correlation of these findings is necessary.  The decision to treat obstructive sleep apnea (OSA) is usually based on the presence of apnea symptoms or the presence of associated medical conditions such as Hypertension, Congestive Heart Failure, Atrial Fibrillation or Obesity.  The most common symptoms of OSA are snoring, gasping for breath while sleeping, daytime sleepiness and fatigue.   2.  Initiating apnea therapy is recommended given the presence of symptoms and/or associated conditions.  Recommend proceeding with one of the following:     a.  Auto-CPAP therapy with a pressure range of 5-20cm H2O.     b.  An oral appliance (OA) that can be obtained from certain dentists with expertise in sleep medicine.  These are primarily of use in non-obese patients with mild and moderate disease.     c.  An ENT consultation which may be useful to look for specific causes of obstruction and possible treatment options.     d.  If patient is intolerant to PAP therapy, consider referral to ENT for evaluation for hypoglossal nerve stimulator.   3.  Close follow-up is necessary to ensure success with CPAP or oral appliance therapy for maximum benefit.  4.  A follow-up oximetry study on CPAP is recommended to assess the adequacy of therapy and determine the need for supplemental oxygen or the potential need for Bi-level therapy.  An arterial blood gas to determine the adequacy of baseline ventilation and oxygenation should also be considered.  5.  Healthy sleep recommendations include:  adequate nightly sleep (normal 7-9 hrs/night), avoidance of caffeine after noon and alcohol near bedtime, and maintaining a sleep environment that is cool, dark and quiet.  6.  Weight loss for overweight patients is recommended.  Even modest amounts of weight loss can significantly improve the severity of sleep apnea.  7.  Snoring recommendations include:  weight loss where appropriate, side sleeping, and avoidance of alcohol before bed.  8.  Operation of motor vehicle should be avoided when sleepy.  Signature:   Harrison,  MD; Leonidas Romberg; Bruni, [place] Board of Sleep Medicine Electronically Signed: 03/26/2022

## 2022-03-30 ENCOUNTER — Telehealth (HOSPITAL_COMMUNITY): Payer: Self-pay

## 2022-03-30 ENCOUNTER — Other Ambulatory Visit (HOSPITAL_COMMUNITY): Payer: Self-pay

## 2022-03-30 NOTE — Telephone Encounter (Signed)
Patient called to report that she has been having a rash and itching all over her body for the past week. She has tried OTC medication but has had no relief.  She thinks it is due to the [place] and has stopped taking it. Please advise.

## 2022-03-30 NOTE — Telephone Encounter (Signed)
Would NOT stop Uptravi.  Consequences of coming completely off her pulmonary hypertension medication far outweigh a rash.  She has been on the [place] for a while prior to rash coming up, have no idea whether the rash is really due to the [place] or not.  Lauren, could you take a look at her meds? Would try Atarax for the itching for now.  If Lauren suspects rash could be due to [place], would decrease to the previous dose rather than stopping altogether. Would be reasonable to see PCP or dermatology for rash evaluation also.  Would make sure she understands the gravity of her pulmonary hypertension diagnosis.

## 2022-03-30 NOTE — Telephone Encounter (Signed)
Uptravi causes rash in ~11% of patients per the package insert. This is most commonly related to vasodilation and is not generally considered a dose-limiting side effect. As long as she is not having a serious allergic reaction, including itching/swelling of face/tongue/throat, it is recommended to provide supportive care such as cold compresses and Atarax would be reasonable. Slow titration is recommended if needed, so it would be reasonable to resume her previous dose of Uptravi and take that for an additional 2-3 weeks before going back up on the dose.

## 2022-03-31 ENCOUNTER — Other Ambulatory Visit (INDEPENDENT_AMBULATORY_CARE_PROVIDER_SITE_OTHER): Payer: BC Managed Care – PPO

## 2022-03-31 ENCOUNTER — Other Ambulatory Visit (HOSPITAL_COMMUNITY): Payer: Self-pay | Admitting: Pharmacist

## 2022-03-31 ENCOUNTER — Other Ambulatory Visit (HOSPITAL_COMMUNITY): Payer: Self-pay

## 2022-03-31 ENCOUNTER — Other Ambulatory Visit: Payer: Self-pay

## 2022-03-31 DIAGNOSIS — E063 Autoimmune thyroiditis: Secondary | ICD-10-CM

## 2022-03-31 LAB — TSH: TSH: 3.03 u[IU]/mL (ref 0.35–5.50)

## 2022-03-31 MED ORDER — TADALAFIL 20 MG PO TABS
40.0000 mg | ORAL_TABLET | Freq: Every day | ORAL | 11 refills | Status: AC
  Filled 2022-03-31: qty 60, 30d supply, fill #0
  Filled 2022-04-25 – 2022-04-26 (×2): qty 60, 30d supply, fill #1
  Filled 2022-07-09: qty 60, 30d supply, fill #2

## 2022-03-31 MED ORDER — HYDROXYZINE HCL 10 MG PO TABS: 10.0000 mg | ORAL_TABLET | Freq: Three times a day (TID) | ORAL | 1 refills | Status: DC | PRN

## 2022-03-31 NOTE — Telephone Encounter (Signed)
Spoke with patient. She has a mild rash that appeared after Uptravi was increased to 1000 mcg BID. She tried OTC cortisone cream, which helped with the itching, but not for long. She is amenable to trying hydroxyzine for itching and will decrease Uptravi to 800 mcg BID for 2-3 weeks before resuming uptitration. She will contact the clinic if she has any further issues. [facility] will [facility] and inform [facility] regarding dose change of Uptravi. Patient expressed understanding.

## 2022-04-01 ENCOUNTER — Other Ambulatory Visit: Payer: Self-pay

## 2022-04-25 ENCOUNTER — Other Ambulatory Visit (HOSPITAL_COMMUNITY): Payer: Self-pay

## 2022-04-26 ENCOUNTER — Other Ambulatory Visit (HOSPITAL_COMMUNITY): Payer: Self-pay

## 2022-04-29 ENCOUNTER — Encounter (HOSPITAL_COMMUNITY): Payer: BC Managed Care – PPO

## 2022-04-30 ENCOUNTER — Encounter (HOSPITAL_COMMUNITY): Payer: Self-pay | Admitting: Cardiology

## 2022-04-30 ENCOUNTER — Encounter (HOSPITAL_COMMUNITY): Payer: Self-pay

## 2022-04-30 ENCOUNTER — Other Ambulatory Visit (HOSPITAL_COMMUNITY): Payer: Self-pay

## 2022-04-30 ENCOUNTER — Ambulatory Visit (HOSPITAL_COMMUNITY)
Admission: RE | Admit: 2022-04-30 | Discharge: 2022-04-30 | Disposition: A | Discharge status: Home / Self Care | Payer: BC Managed Care – PPO | Source: Ambulatory Visit | Attending: Family Medicine | Admitting: Family Medicine

## 2022-04-30 VITALS — BP 100/68 | HR 74 | Wt 102.0 lb

## 2022-04-30 DIAGNOSIS — O039 Complete or unspecified spontaneous abortion without complication: Secondary | ICD-10-CM | POA: Diagnosis not present

## 2022-04-30 DIAGNOSIS — Z79899 Other long term (current) drug therapy: Secondary | ICD-10-CM | POA: Diagnosis not present

## 2022-04-30 DIAGNOSIS — R0902 Hypoxemia: Secondary | ICD-10-CM | POA: Insufficient documentation

## 2022-04-30 DIAGNOSIS — M329 Systemic lupus erythematosus, unspecified: Secondary | ICD-10-CM | POA: Insufficient documentation

## 2022-04-30 DIAGNOSIS — I272 Pulmonary hypertension, unspecified: Secondary | ICD-10-CM | POA: Insufficient documentation

## 2022-04-30 DIAGNOSIS — O99891 Other specified diseases and conditions complicating pregnancy: Secondary | ICD-10-CM | POA: Insufficient documentation

## 2022-04-30 DIAGNOSIS — R112 Nausea with vomiting, unspecified: Secondary | ICD-10-CM | POA: Diagnosis not present

## 2022-04-30 LAB — BASIC METABOLIC PANEL
Anion gap: 8 (ref 5–15)
BUN: <5 mg/dL — ABNORMAL LOW (ref 6–20)
CO2: 20 mmol/L — ABNORMAL LOW (ref 22–32)
Calcium: 7.7 mg/dL — ABNORMAL LOW (ref 8.9–10.3)
Chloride: 99 mmol/L (ref 98–111)
Creatinine, Ser: 0.56 mg/dL (ref 0.44–1.00)
GFR, Estimated: >60 mL/min (ref >60)
Glucose, Bld: 79 mg/dL (ref 70–99)
Potassium: 3.2 mmol/L — ABNORMAL LOW (ref 3.5–5.1)
Sodium: 127 mmol/L — ABNORMAL LOW (ref 135–145)

## 2022-04-30 MED ORDER — DIGOXIN 125 MCG PO TABS: 0.0625 mg | ORAL_TABLET | Freq: Every day | ORAL | 3 refills | Status: AC

## 2022-04-30 NOTE — Addendum Note (Signed)
Encounter addended by: Kerry Dory, CMA on: 04/30/2022 2:51 PM  Actions taken: Diagnosis association updated, Order list changed, In Basket message sent, Clinical Note Signed

## 2022-04-30 NOTE — Addendum Note (Signed)
Encounter addended by: Rafael Bihari, FNP on: 04/30/2022 2:47 PM  Actions taken: Clinical Note Signed

## 2022-04-30 NOTE — Addendum Note (Signed)
Encounter addended by: Kerry Dory, CMA on: 04/30/2022 2:16 PM  Actions taken: Order list changed, Diagnosis association updated

## 2022-04-30 NOTE — Patient Instructions (Addendum)
DECREASE Digoxin to 0.0625 mg (one half tab) daily CONTINUE Uptravi 800 mcg twice a day  Labs today We will only contact you if something comes back abnormal or we need to make some changes. Otherwise no news is good news!  Labs needed in 2 weeks  Your physician recommends that you schedule a follow-up appointment in: 2 months with Dr Aundra Dubin Please call our office in late April to schedule a May 2024 follow up...  Do the following things EVERYDAY: Weigh yourself in the morning before breakfast. Write it down and keep it in a log. Take your medicines as prescribed Eat low salt foods--Limit salt (sodium) to 2000 mg per day.  Stay as active as you can everyday Limit all fluids for the day to less than 2 liters  At the [facility], you and your health needs are our priority. As part of our continuing mission to provide you with exceptional heart care, we have created designated Provider Care Teams. These Care Teams include your primary Cardiologist (physician) and Advanced Practice Providers (APPs- Physician Assistants and Nurse Practitioners) who all work together to provide you with the care you need, when you need it.   You may see any of the following providers on your designated Care Team at your next follow up: Dr Glori Bickers Dr Loralie Champagne Dr. Roxana Hires, NP Lyda Jester, [place] [facility] [place], [place] Forestine Na, NP Audry Riles, PharmD   Please be sure to bring in all your medications bottles to every appointment.    Thank you for choosing [facility]   If you have any questions or concerns before your next appointment please send [place] a message through [employer] or call our office at [phone].    TO LEAVE A MESSAGE FOR THE NURSE SELECT OPTION 2, PLEASE LEAVE A MESSAGE INCLUDING: YOUR NAME DATE OF BIRTH CALL BACK NUMBER REASON FOR CALL**this is important as we prioritize the  call backs  YOU WILL RECEIVE A CALL BACK THE SAME DAY AS LONG AS YOU CALL BEFORE 4:00 PM      You are scheduled for a Cardiac Catheterization on Friday, February 23 with Dr. Loralie Champagne.  1. Please arrive at the [facility] (Main Entrance A) at [facility]: [address] [place], [place] [address] at 11:30 AM (This time is two hours before your procedure to ensure your preparation). Free valet parking service is available.   Special note: Every effort is made to have your procedure done on time. Please understand that emergencies sometimes delay scheduled procedures.  2. Diet: Do not eat solid foods after midnight.  The patient may have clear liquids until 5am upon the day of the procedure.  3. Labs: Pre procedure labs done 04/30/2022  4. Medication instructions in preparation for your procedure:   Contrast Allergy: No    Stop taking, Lasix (Furosemide)  Friday, February 23,    On the morning of your procedure, take your Aspirin 81 mg and any morning medicines NOT listed above.  You may use sips of water.  5. Plan for one night stay--bring personal belongings. 6. Bring a current list of your medications and current insurance cards. 7. You MUST have a responsible person to drive you home. 8. Someone MUST be with you the first 24 hours after you arrive home or your discharge will be delayed. 9. Please wear clothes that are easy to get on and off and wear slip-on shoes.  Thank you for allowing [place] to care for you!   -- [facility]

## 2022-04-30 NOTE — Progress Notes (Addendum)
PCP: [name], NP Rheumatology: Dr. Lenna Gilford Pulmonary: Dr. Silas Flood Cardiology: Dr. Aundra Dubin  22 y.o. with history of SLE and seronegative rheumatoid arthritis was found to have pulmonary hypertension.  SLE is followed by Dr. Lenna Gilford.  She is on belimumab.  Earlier this year, she developed exertional dyspnea.  Her primary care physician ordered an echo, which showed mildly dilated RV with mild systolic dysfunction and PASP 86.  She was referred to pulmonary for evaluation.  PFTs showed moderate restriction with decreased DLCO. V/Q scan showed no evidence for chronic PE.  RHC in 7/23 showed severe pulmonary arterial hypertension.   Echo in 10/23 showed EF 55-60%, D-shaped septum, moderate RV enlargement with moderate RV dysfunction, PASP 124 mmHg.   Treatment of pulmonary hypertension was delayed somewhat by patient finding herself to be pregnant, but she had a miscarriage.  She has now had Nexplanon for contraception.   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  RHC was done, showing severe pulmonary arterial hypertension with normal filling pressures after diuresis and preserved cardiac output.   Follow up 1/24, had a recent lupus flare. Also had recent GI illness and ? Side effect of recent up titration of selexipag and advised to wait a week before uptitrating again.    Today she returns for HF follow up with her mother. Overall feeling fair, remains weak. She had severe N/V on selexipeg 1000, weight dropped to 95 lbs and could only tolerate liquids. She feels better on 800 bid and is now able to tolerate solid food now. She has mild dyspnea walking on flat ground, does OK if she walks slowly. Feels palpitations. Denies abnormal bleeding, CP, dizziness, edema, or PND/Orthopnea. Appetite  improving. No fever or chills. Weight at home 103 pounds. Taking all medications, continues on 0.125 digoxin.  ECG (personally reviewed): none ordered today.   6  minute walk (9/23): 528 m 6 minute walk (11/23): 335 m 6 minute walk (02/02/22): 274 m 6 minute walk (05/10/22): 107 m, oxygen as low as 76%  PMH: 1. SLE: ANA 1:2560.   2. Seronegative rheumatoid arthritis.  3. Traumatic SAH: 2022.  4. 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  Echo (2023, [facility]) showed mild RV dilation with mild RV dysfunction, PASP 86 mmHg.  - RHC (7/23): mean RA 6, PA 70/31 mean 46, mean PCWP 2, CI 2.49, PVR 11.6 WU, PAPi 6.5.  - PFTs (7/23): Moderate restriction, moderately decreased DLCO.  - V/Q scan (7/23): No acute or chronic PE.  - Echo (10/23): EF 55-60%, D-shaped septum, moderate RV enlargement with moderate RV dysfunction, PASP 124 mmHg.  - RHC (11/23): mean RA 7, PA 82/36 mean 54, mean PCWP 10, CI 2.77, PVR 10.5 WU 5. Hypothryroidism  Social History   Socioeconomic History   Marital status: Single    Spouse name: Not on file   Number of children: Not on file   Years of education: Not on file   Highest education level: Not on file  Occupational History   Not on file  Tobacco Use   Smoking status: Never   Smokeless tobacco: Never  Vaping Use   Vaping Use: Never used  Substance and Sexual Activity   Alcohol use: Not Currently   Drug use: Never   Sexual activity: Not Currently  Other Topics Concern   Not on file  Social History Narrative   ** Merged History Encounter **       Social Determinants of Health  Financial Resource Strain: Not on file  Food Insecurity: Not on file  Transportation Needs: Not on file  Physical Activity: Not on file  Stress: Not on file  Social Connections: Not on file  Intimate Partner Violence: Not on file   Family History  Problem Relation Age of Onset   Graves' disease Mother    Healthy Father    Diabetes Maternal Grandfather    ROS: All systems reviewed and negative except as per HPI.   Current Outpatient Medications  Medication Sig Dispense Refill   Belimumab 200 MG/ML SOAJ Inject 200  mg into the skin once a week. Q Wednesday     digoxin (LANOXIN) 0.125 MG tablet Take 1 tablet (0.125 mg total) by mouth daily. 90 tablet 3   furosemide (LASIX) 20 MG tablet Take 1 tablet (20 mg total) by mouth daily. 90 tablet 3   hydrOXYzine (ATARAX) 10 MG tablet Take 1 tablet (10 mg total) by mouth 3 (three) times daily as needed for itching. 90 tablet 1   levothyroxine (SYNTHROID) 150 MCG tablet Take 1 tablet (150 mcg total) by mouth daily before breakfast. 90 tablet 3   macitentan (OPSUMIT) 10 MG tablet Take 10 mg by mouth daily.     Selexipag (UPTRAVI) 800 MCG TABS Take 1 tablet (800 mcg total) by mouth 2 (two) times daily. 60 tablet 11   tadalafil (CIALIS) 20 MG tablet Take 2 tablets (40 mg total) by mouth daily. 60 tablet 11   No current facility-administered medications for this encounter.   Wt Readings from Last 3 Encounters:  04/30/22 46.3 kg (102 lb)  03/16/22 47.4 kg (104 lb 9.6 oz)  02/10/22 49.9 kg (110 lb)   BP 100/68   Pulse 74   Wt 46.3 kg (102 lb)   BMI 18.66 kg/m  Physical Exam General:  NAD. No resp difficulty, thin, weak-appearing, pale HEENT: Normal Neck: Supple. No JVD. Carotids 2+ bilat; no bruits. No lymphadenopathy or thryomegaly appreciated. Cor: PMI nondisplaced. Regular rate & rhythm. No rubs, gallops or murmurs. Loud P2 Lungs: Clear, diminished in bases. Abdomen: Soft, nontender, nondistended. No hepatosplenomegaly. No bruits or masses. Good bowel sounds. Extremities: No cyanosis, clubbing, rash, edema Neuro: Alert & oriented x 3, cranial nerves grossly intact. Moves all 4 extremities w/o difficulty. Affect pleasant.  Assessment/Plan: 1. Pulmonary hypertension: Patient had severe PAH by 7/23 RHC.  Echo in 2023 showed mildly dilated RV with mild RV systolic dysfunction, PASP was elevated at 86 mmHg.  V/Q scan did not suggest the presence of chronic PEs.  HIV negative.  No known liver disease.  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 Echo in 10/23  showed EF 55-60%, D-shaped septum, moderate RV enlargement with moderate RV dysfunction, PASP 124 mmHg.  She was admitted with cor pulmonale in 11/23 (had not started [place] meds due to pregnancy, had miscarriage) and required milrinone and diuresis.  RHC after diuresis and weaning off milrinone and on tadalafil and Opsumit showed normal filling pressures and preserved cardiac output but severe PAH.  She is going to need aggressive treatment of her PH via pulmonary vasodilators.  I think we have some time to trial oral therapy given preserved cardiac output.  She has recently titrated selexipag up to 1000 mcg bid, but she had severe nausea and vomiting with this and weight dropped to 95 lbs. She has tolerated 800 mcg, and will keep her dose here for now. - She has had Nexplanon for contraception.  - Decrease digoxin  to 0.0625 mg daily (recent dig dose elevated). She needs a dig trough in a 10-14 days. - Continue Opsumit. - Continue Lasix 20 mg daily. BMET today. - Continue tadalafil 40 mg daily.  - Sleep study pending.  - 6MW today worse today. Oxygen saturation down to 76% during 6MW, will arrange for home oxygen. - With worsening 6MW and hypoxia, will arrange RHC to re-evaluate hemodynamics and pulmonary pressures. She may require IV prostacyclin. Discussed risks/benefits and she is agreeable. Discussed with Dr. Aundra Dubin, will try to arrange ASAP. 2. SLE: On belimumab.  Followed by Dr. Lenna Gilford.   Follow up in 2 months with Dr. Aundra Dubin.  Jessica Frazier [facility] FNP-BC 04/30/2022

## 2022-04-30 NOTE — H&P (View-Only) (Signed)
PCP: [name], NP Rheumatology: Dr. Lenna Gilford Pulmonary: Dr. Silas Flood Cardiology: Dr. Aundra Dubin  22 y.o. with history of SLE and seronegative rheumatoid arthritis was found to have pulmonary hypertension.  SLE is followed by Dr. Lenna Gilford.  She is on belimumab.  Earlier this year, she developed exertional dyspnea.  Her primary care physician ordered an echo, which showed mildly dilated RV with mild systolic dysfunction and PASP 86.  She was referred to pulmonary for evaluation.  PFTs showed moderate restriction with decreased DLCO. V/Q scan showed no evidence for chronic PE.  RHC in 7/23 showed severe pulmonary arterial hypertension.   Echo in 10/23 showed EF 55-60%, D-shaped septum, moderate RV enlargement with moderate RV dysfunction, PASP 124 mmHg.   Treatment of pulmonary hypertension was delayed somewhat by patient finding herself to be pregnant, but she had a miscarriage.  She has now had Nexplanon for contraception.   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  RHC was done, showing severe pulmonary arterial hypertension with normal filling pressures after diuresis and preserved cardiac output.   Follow up 1/24, had a recent lupus flare. Also had recent GI illness and ? Side effect of recent up titration of selexipag and advised to wait a week before uptitrating again.    Today she returns for HF follow up with her mother. Overall feeling fair, remains weak. She had severe N/V on selexipeg 1000, weight dropped to 95 lbs and could only tolerate liquids. She feels better on 800 bid and is now able to tolerate solid food now. She has mild dyspnea walking on flat ground, does OK if she walks slowly. Feels palpitations. Denies abnormal bleeding, CP, dizziness, edema, or PND/Orthopnea. Appetite  improving. No fever or chills. Weight at home 103 pounds. Taking all medications, continues on 0.125 digoxin.  ECG (personally reviewed): none ordered today.   6  minute walk (9/23): 528 m 6 minute walk (11/23): 335 m 6 minute walk (02/02/22): 274 m 6 minute walk (05/10/22): 107 m, oxygen as low as 76%  PMH: 1. SLE: ANA 1:2560.   2. Seronegative rheumatoid arthritis.  3. Traumatic SAH: 2022.  4. 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  Echo (2023, [facility]) showed mild RV dilation with mild RV dysfunction, PASP 86 mmHg.  - RHC (7/23): mean RA 6, PA 70/31 mean 46, mean PCWP 2, CI 2.49, PVR 11.6 WU, PAPi 6.5.  - PFTs (7/23): Moderate restriction, moderately decreased DLCO.  - V/Q scan (7/23): No acute or chronic PE.  - Echo (10/23): EF 55-60%, D-shaped septum, moderate RV enlargement with moderate RV dysfunction, PASP 124 mmHg.  - RHC (11/23): mean RA 7, PA 82/36 mean 54, mean PCWP 10, CI 2.77, PVR 10.5 WU 5. Hypothryroidism  Social History   Socioeconomic History   Marital status: Single    Spouse name: Not on file   Number of children: Not on file   Years of education: Not on file   Highest education level: Not on file  Occupational History   Not on file  Tobacco Use   Smoking status: Never   Smokeless tobacco: Never  Vaping Use   Vaping Use: Never used  Substance and Sexual Activity   Alcohol use: Not Currently   Drug use: Never   Sexual activity: Not Currently  Other Topics Concern   Not on file  Social History Narrative   ** Merged History Encounter **       Social Determinants of Health  Financial Resource Strain: Not on file  Food Insecurity: Not on file  Transportation Needs: Not on file  Physical Activity: Not on file  Stress: Not on file  Social Connections: Not on file  Intimate Partner Violence: Not on file   Family History  Problem Relation Age of Onset   Graves' disease Mother    Healthy Father    Diabetes Maternal Grandfather    ROS: All systems reviewed and negative except as per HPI.   Current Outpatient Medications  Medication Sig Dispense Refill   Belimumab 200 MG/ML SOAJ Inject 200  mg into the skin once a week. Q Wednesday     digoxin (LANOXIN) 0.125 MG tablet Take 1 tablet (0.125 mg total) by mouth daily. 90 tablet 3   furosemide (LASIX) 20 MG tablet Take 1 tablet (20 mg total) by mouth daily. 90 tablet 3   hydrOXYzine (ATARAX) 10 MG tablet Take 1 tablet (10 mg total) by mouth 3 (three) times daily as needed for itching. 90 tablet 1   levothyroxine (SYNTHROID) 150 MCG tablet Take 1 tablet (150 mcg total) by mouth daily before breakfast. 90 tablet 3   macitentan (OPSUMIT) 10 MG tablet Take 10 mg by mouth daily.     Selexipag (UPTRAVI) 800 MCG TABS Take 1 tablet (800 mcg total) by mouth 2 (two) times daily. 60 tablet 11   tadalafil (CIALIS) 20 MG tablet Take 2 tablets (40 mg total) by mouth daily. 60 tablet 11   No current facility-administered medications for this encounter.   Wt Readings from Last 3 Encounters:  04/30/22 46.3 kg (102 lb)  03/16/22 47.4 kg (104 lb 9.6 oz)  02/10/22 49.9 kg (110 lb)   BP 100/68   Pulse 74   Wt 46.3 kg (102 lb)   BMI 18.66 kg/m  Physical Exam General:  NAD. No resp difficulty, thin, weak-appearing, pale HEENT: Normal Neck: Supple. No JVD. Carotids 2+ bilat; no bruits. No lymphadenopathy or thryomegaly appreciated. Cor: PMI nondisplaced. Regular rate & rhythm. No rubs, gallops or murmurs. Loud P2 Lungs: Clear, diminished in bases. Abdomen: Soft, nontender, nondistended. No hepatosplenomegaly. No bruits or masses. Good bowel sounds. Extremities: No cyanosis, clubbing, rash, edema Neuro: Alert & oriented x 3, cranial nerves grossly intact. Moves all 4 extremities w/o difficulty. Affect pleasant.  Assessment/Plan: 1. Pulmonary hypertension: Patient had severe PAH by 7/23 RHC.  Echo in 2023 showed mildly dilated RV with mild RV systolic dysfunction, PASP was elevated at 86 mmHg.  V/Q scan did not suggest the presence of chronic PEs.  HIV negative.  No known liver disease.  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 Echo in 10/23  showed EF 55-60%, D-shaped septum, moderate RV enlargement with moderate RV dysfunction, PASP 124 mmHg.  She was admitted with cor pulmonale in 11/23 (had not started [place] meds due to pregnancy, had miscarriage) and required milrinone and diuresis.  RHC after diuresis and weaning off milrinone and on tadalafil and Opsumit showed normal filling pressures and preserved cardiac output but severe PAH.  She is going to need aggressive treatment of her PH via pulmonary vasodilators.  I think we have some time to trial oral therapy given preserved cardiac output.  She has recently titrated selexipag up to 1000 mcg bid, but she had severe nausea and vomiting with this and weight dropped to 95 lbs. She has tolerated 800 mcg, and will keep her dose here for now. - She has had Nexplanon for contraception.  - Decrease digoxin  to 0.0625 mg daily (recent dig dose elevated). She needs a dig trough in a 10-14 days. - Continue Opsumit. - Continue Lasix 20 mg daily. BMET today. - Continue tadalafil 40 mg daily.  - Sleep study pending.  - 6MW today worse today. Oxygen saturation down to 76% during 6MW, will arrange for home oxygen. - With worsening 6MW and hypoxia, will arrange RHC to re-evaluate hemodynamics and pulmonary pressures. She may require IV prostacyclin. Discussed risks/benefits and she is agreeable. Discussed with Dr. Aundra Dubin, will try to arrange ASAP. 2. SLE: On belimumab.  Followed by Dr. Lenna Gilford.   Follow up in 2 months with Dr. Aundra Dubin.  Jessica Frazier [facility] FNP-BC 04/30/2022

## 2022-04-30 NOTE — Progress Notes (Signed)
Six minute walk completed Pt ambulated ~350 ft (17m Pt did have rest break x 1 (lasting 1:02)and ended test with 45 seconds remaining due to feet numbness  HR ranged 113-136 O2 sats ranged 76-100% on room air

## 2022-04-30 NOTE — Addendum Note (Signed)
Encounter addended by: Rafael Bihari, FNP on: 04/30/2022 2:37 PM  Actions taken: Clinical Note Signed

## 2022-05-03 ENCOUNTER — Telehealth (HOSPITAL_COMMUNITY): Payer: Self-pay | Admitting: Cardiology

## 2022-05-03 ENCOUNTER — Other Ambulatory Visit (HOSPITAL_COMMUNITY): Payer: Self-pay | Admitting: Family Medicine

## 2022-05-03 DIAGNOSIS — I2721 Secondary pulmonary arterial hypertension: Secondary | ICD-10-CM

## 2022-05-03 MED ORDER — POTASSIUM CHLORIDE CRYS ER 20 MEQ PO TBCR: EXTENDED_RELEASE_TABLET | ORAL | 11 refills | Status: DC

## 2022-05-03 NOTE — Progress Notes (Signed)
SATURATION QUALIFICATIONS: (This note is used to comply with regulatory documentation for home oxygen)  Patient Saturations on Room Air at Rest = 99%  Patient Saturations on Room Air while Ambulating = 76%  Patient Saturations on 2 Liters of oxygen while Ambulating = 98%  Please briefly explain why patient needs home oxygen:pulmonary hypertension, desaturation  Please evaluate and titrate for best fit POC or portable O2 system. RT to evaluate and titrate patient for POC or Homefill with OCD maintain sats >/=90%. if patient qualifies, dispense POC or homefill with OCD 1-5 pulse dose.

## 2022-05-03 NOTE — Telephone Encounter (Signed)
-----   Message from Rafael Bihari, [place] sent at 04/30/2022  4:29 PM EST ----- K is low. Please take 40 KCL x 1 today, then start 20 KCL daily thereafter. She has RHC next week and can have labs repeated at that time

## 2022-05-03 NOTE — Addendum Note (Signed)
Encounter addended by: Kerry Dory, CMA on: 05/03/2022 1:34 PM  Actions taken: Care Plan modified, Order list changed, Diagnosis association updated, Clinical Note Signed

## 2022-05-03 NOTE — Telephone Encounter (Signed)
Patient called.  Patient aware.  

## 2022-05-04 ENCOUNTER — Other Ambulatory Visit (HOSPITAL_COMMUNITY): Payer: Self-pay | Admitting: Family Medicine

## 2022-05-04 ENCOUNTER — Ambulatory Visit (HOSPITAL_COMMUNITY)
Admission: RE | Admit: 2022-05-04 | Discharge: 2022-05-04 | Disposition: A | Discharge status: Home / Self Care | Payer: BC Managed Care – PPO | Source: Ambulatory Visit | Attending: Cardiology | Admitting: Cardiology

## 2022-05-04 DIAGNOSIS — I2721 Secondary pulmonary arterial hypertension: Secondary | ICD-10-CM

## 2022-05-04 LAB — BASIC METABOLIC PANEL
Anion gap: 14 (ref 5–15)
BUN: 8 mg/dL (ref 6–20)
CO2: 17 mmol/L — ABNORMAL LOW (ref 22–32)
Calcium: 7.9 mg/dL — ABNORMAL LOW (ref 8.9–10.3)
Chloride: 99 mmol/L (ref 98–111)
Creatinine, Ser: 0.69 mg/dL (ref 0.44–1.00)
GFR, Estimated: >60 mL/min (ref >60)
Glucose, Bld: 99 mg/dL (ref 70–99)
Potassium: 3.5 mmol/L (ref 3.5–5.1)
Sodium: 130 mmol/L — ABNORMAL LOW (ref 135–145)

## 2022-05-04 LAB — DIGOXIN LEVEL: Digoxin Level: 0.4 ng/mL — ABNORMAL LOW (ref 0.8–2.0)

## 2022-05-04 LAB — CBC
HCT: 32.8 % — ABNORMAL LOW (ref 36.0–46.0)
Hemoglobin: 10.5 g/dL — ABNORMAL LOW (ref 12.0–15.0)
MCH: 26.9 pg (ref 26.0–34.0)
MCHC: 32 g/dL (ref 30.0–36.0)
MCV: 83.9 fL (ref 80.0–100.0)
Platelets: 399 10*3/uL (ref 150–400)
RBC: 3.91 MIL/uL (ref 3.87–5.11)
RDW: 19 % — ABNORMAL HIGH (ref 11.5–15.5)
WBC: 4.9 10*3/uL (ref 4.0–10.5)
nRBC: 0 % (ref 0.0–0.2)

## 2022-05-04 LAB — BRAIN NATRIURETIC PEPTIDE: B Natriuretic Peptide: 709.6 pg/mL — ABNORMAL HIGH (ref 0.0–100.0)

## 2022-05-05 ENCOUNTER — Telehealth (HOSPITAL_COMMUNITY): Payer: Self-pay | Admitting: Cardiology

## 2022-05-05 MED ORDER — FUROSEMIDE 20 MG PO TABS: 40.0000 mg | ORAL_TABLET | Freq: Two times a day (BID) | ORAL | 11 refills | Status: DC

## 2022-05-05 MED ORDER — POTASSIUM CHLORIDE CRYS ER 20 MEQ PO TBCR: 40.0000 meq | EXTENDED_RELEASE_TABLET | Freq: Every day | ORAL | 11 refills | Status: AC

## 2022-05-05 NOTE — Telephone Encounter (Signed)
-----   Message from Rafael Bihari, [place] sent at 05/04/2022  3:12 PM EST ----- Sodium improved.   BNP elevated, please increase Lasix to 40 mg bid, increase KCL to 40 daily.   Will repeat her labs at her follow up.

## 2022-05-05 NOTE — Telephone Encounter (Signed)
Patient called.  Patient aware.  -pt has [place] 2/23 will have labs repeated at this time

## 2022-05-07 ENCOUNTER — Inpatient Hospital Stay: Payer: Self-pay

## 2022-05-07 ENCOUNTER — Encounter (HOSPITAL_COMMUNITY)
Admission: RE | Disposition: A | Discharge status: Other Acute Inpatient Hospital | Payer: Self-pay | Source: Home / Self Care | Attending: Cardiology

## 2022-05-07 ENCOUNTER — Encounter (HOSPITAL_COMMUNITY): Payer: Self-pay | Admitting: Cardiology

## 2022-05-07 ENCOUNTER — Inpatient Hospital Stay (HOSPITAL_COMMUNITY)
Admission: RE | Admit: 2022-05-07 | Discharge: 2022-05-07 | DRG: 547 | Disposition: A | Discharge status: Other Acute Inpatient Hospital | Payer: BC Managed Care – PPO | Attending: Cardiology | Admitting: Cardiology

## 2022-05-07 ENCOUNTER — Other Ambulatory Visit: Payer: Self-pay

## 2022-05-07 DIAGNOSIS — I272 Pulmonary hypertension, unspecified: Secondary | ICD-10-CM | POA: Diagnosis present

## 2022-05-07 DIAGNOSIS — Z888 Allergy status to other drugs, medicaments and biological substances status: Secondary | ICD-10-CM | POA: Diagnosis not present

## 2022-05-07 DIAGNOSIS — M0600 Rheumatoid arthritis without rheumatoid factor, unspecified site: Secondary | ICD-10-CM | POA: Diagnosis present

## 2022-05-07 DIAGNOSIS — I2781 Cor pulmonale (chronic): Secondary | ICD-10-CM | POA: Diagnosis present

## 2022-05-07 DIAGNOSIS — I2721 Secondary pulmonary arterial hypertension: Secondary | ICD-10-CM | POA: Diagnosis present

## 2022-05-07 DIAGNOSIS — Z79899 Other long term (current) drug therapy: Secondary | ICD-10-CM | POA: Diagnosis not present

## 2022-05-07 DIAGNOSIS — Z79890 Hormone replacement therapy (postmenopausal): Secondary | ICD-10-CM

## 2022-05-07 DIAGNOSIS — Z8349 Family history of other endocrine, nutritional and metabolic diseases: Secondary | ICD-10-CM | POA: Diagnosis not present

## 2022-05-07 DIAGNOSIS — R0902 Hypoxemia: Secondary | ICD-10-CM | POA: Diagnosis present

## 2022-05-07 DIAGNOSIS — E039 Hypothyroidism, unspecified: Secondary | ICD-10-CM | POA: Diagnosis present

## 2022-05-07 DIAGNOSIS — M329 Systemic lupus erythematosus, unspecified: Principal | ICD-10-CM | POA: Diagnosis present

## 2022-05-07 DIAGNOSIS — Z833 Family history of diabetes mellitus: Secondary | ICD-10-CM | POA: Diagnosis not present

## 2022-05-07 HISTORY — PX: RIGHT HEART CATH: CATH118263

## 2022-05-07 LAB — POCT I-STAT EG7
Acid-base deficit: 5 mmol/L — ABNORMAL HIGH (ref 0.0–2.0)
Acid-base deficit: 5 mmol/L — ABNORMAL HIGH (ref 0.0–2.0)
Acid-base deficit: 6 mmol/L — ABNORMAL HIGH (ref 0.0–2.0)
Bicarbonate: 18 mmol/L — ABNORMAL LOW (ref 20.0–28.0)
Bicarbonate: 18.8 mmol/L — ABNORMAL LOW (ref 20.0–28.0)
Bicarbonate: 19.7 mmol/L — ABNORMAL LOW (ref 20.0–28.0)
Calcium, Ion: 1.09 mmol/L — ABNORMAL LOW (ref 1.15–1.40)
Calcium, Ion: 1.13 mmol/L — ABNORMAL LOW (ref 1.15–1.40)
Calcium, Ion: 1.13 mmol/L — ABNORMAL LOW (ref 1.15–1.40)
HCT: 30 % — ABNORMAL LOW (ref 36.0–46.0)
HCT: 31 % — ABNORMAL LOW (ref 36.0–46.0)
HCT: 31 % — ABNORMAL LOW (ref 36.0–46.0)
Hemoglobin: 10.2 g/dL — ABNORMAL LOW (ref 12.0–15.0)
Hemoglobin: 10.5 g/dL — ABNORMAL LOW (ref 12.0–15.0)
Hemoglobin: 10.5 g/dL — ABNORMAL LOW (ref 12.0–15.0)
O2 Saturation: 36 %
O2 Saturation: 40 %
O2 Saturation: 41 %
Potassium: 4 mmol/L (ref 3.5–5.1)
Potassium: 4 mmol/L (ref 3.5–5.1)
Potassium: 4 mmol/L (ref 3.5–5.1)
Sodium: 133 mmol/L — ABNORMAL LOW (ref 135–145)
Sodium: 133 mmol/L — ABNORMAL LOW (ref 135–145)
Sodium: 133 mmol/L — ABNORMAL LOW (ref 135–145)
TCO2: 19 mmol/L — ABNORMAL LOW (ref 22–32)
TCO2: 20 mmol/L — ABNORMAL LOW (ref 22–32)
TCO2: 21 mmol/L — ABNORMAL LOW (ref 22–32)
pCO2, Ven: 29.4 mmHg — ABNORMAL LOW (ref 44–60)
pCO2, Ven: 30 mmHg — ABNORMAL LOW (ref 44–60)
pCO2, Ven: 32.5 mmHg — ABNORMAL LOW (ref 44–60)
pH, Ven: 7.391 (ref 7.25–7.43)
pH, Ven: 7.394 (ref 7.25–7.43)
pH, Ven: 7.406 (ref 7.25–7.43)
pO2, Ven: 21 mmHg — CL (ref 32–45)
pO2, Ven: 23 mmHg — CL (ref 32–45)
pO2, Ven: 23 mmHg — CL (ref 32–45)

## 2022-05-07 LAB — COMPREHENSIVE METABOLIC PANEL
ALT: 59 U/L — ABNORMAL HIGH (ref 0–44)
AST: 155 U/L — ABNORMAL HIGH (ref 15–41)
Albumin: 1.9 g/dL — ABNORMAL LOW (ref 3.5–5.0)
Alkaline Phosphatase: 197 U/L — ABNORMAL HIGH (ref 38–126)
Anion gap: 10 (ref 5–15)
BUN: 7 mg/dL (ref 6–20)
CO2: 19 mmol/L — ABNORMAL LOW (ref 22–32)
Calcium: 7.8 mg/dL — ABNORMAL LOW (ref 8.9–10.3)
Chloride: 99 mmol/L (ref 98–111)
Creatinine, Ser: 0.74 mg/dL (ref 0.44–1.00)
GFR, Estimated: >60 mL/min (ref >60)
Glucose, Bld: 107 mg/dL — ABNORMAL HIGH (ref 70–99)
Potassium: 3.7 mmol/L (ref 3.5–5.1)
Sodium: 128 mmol/L — ABNORMAL LOW (ref 135–145)
Total Bilirubin: 1.6 mg/dL — ABNORMAL HIGH (ref 0.3–1.2)
Total Protein: 8 g/dL (ref 6.5–8.1)

## 2022-05-07 LAB — CBC
HCT: 28.2 % — ABNORMAL LOW (ref 36.0–46.0)
Hemoglobin: 9 g/dL — ABNORMAL LOW (ref 12.0–15.0)
MCH: 26 pg (ref 26.0–34.0)
MCHC: 31.9 g/dL (ref 30.0–36.0)
MCV: 81.5 fL (ref 80.0–100.0)
Platelets: 341 10*3/uL (ref 150–400)
RBC: 3.46 MIL/uL — ABNORMAL LOW (ref 3.87–5.11)
RDW: 18.8 % — ABNORMAL HIGH (ref 11.5–15.5)
WBC: 3.7 10*3/uL — ABNORMAL LOW (ref 4.0–10.5)
nRBC: 0 % (ref 0.0–0.2)

## 2022-05-07 LAB — DIGOXIN LEVEL: Digoxin Level: 0.2 ng/mL — ABNORMAL LOW (ref 0.8–2.0)

## 2022-05-07 LAB — PREGNANCY, URINE: Preg Test, Ur: NEGATIVE

## 2022-05-07 SURGERY — RIGHT HEART CATH
Anesthesia: LOCAL

## 2022-05-07 MED ORDER — MILRINONE LACTATE IN DEXTROSE 20-5 MG/100ML-% IV SOLN: 0.2500 ug/kg/min | INTRAVENOUS | Status: AC

## 2022-05-07 MED ORDER — SODIUM CHLORIDE 0.9% FLUSH: 3.0000 mL | Freq: Two times a day (BID) | INTRAVENOUS | Status: DC

## 2022-05-07 MED ORDER — SODIUM CHLORIDE 0.9% FLUSH: 10.0000 mL | INTRAVENOUS | Status: DC | PRN

## 2022-05-07 MED ORDER — ENOXAPARIN SODIUM 40 MG/0.4ML IJ SOSY: 40.0000 mg | PREFILLED_SYRINGE | INTRAMUSCULAR | Status: DC

## 2022-05-07 MED ORDER — SODIUM CHLORIDE 0.9 % IV SOLN: 250.0000 mL | INTRAVENOUS | Status: DC | PRN

## 2022-05-07 MED ORDER — TADALAFIL 20 MG PO TABS: 40.0000 mg | ORAL_TABLET | Freq: Every day | ORAL | Status: DC

## 2022-05-07 MED ORDER — LIDOCAINE HCL (PF) 1 % IJ SOLN
INTRAMUSCULAR | Status: AC
  Filled 2022-05-07: qty 30

## 2022-05-07 MED ORDER — FUROSEMIDE 40 MG PO TABS
40.0000 mg | ORAL_TABLET | Freq: Every day | ORAL | Status: DC
  Administered 2022-05-07: 40 mg via ORAL
  Filled 2022-05-07: qty 1

## 2022-05-07 MED ORDER — ACETAMINOPHEN 325 MG PO TABS
650.0000 mg | ORAL_TABLET | ORAL | Status: DC | PRN
  Administered 2022-05-07: 650 mg via ORAL
  Filled 2022-05-07: qty 2

## 2022-05-07 MED ORDER — HEPARIN SODIUM (PORCINE) 5000 UNIT/ML IJ SOLN: 5000.0000 [IU] | Freq: Three times a day (TID) | INTRAMUSCULAR | Status: DC

## 2022-05-07 MED ORDER — SODIUM CHLORIDE 0.9% FLUSH: 3.0000 mL | INTRAVENOUS | Status: DC | PRN

## 2022-05-07 MED ORDER — SELEXIPAG 800 MCG PO TABS: 800.0000 ug | ORAL_TABLET | Freq: Two times a day (BID) | ORAL | Status: DC

## 2022-05-07 MED ORDER — POTASSIUM CHLORIDE CRYS ER 20 MEQ PO TBCR
40.0000 meq | EXTENDED_RELEASE_TABLET | Freq: Every day | ORAL | Status: DC
  Administered 2022-05-07: 40 meq via ORAL
  Filled 2022-05-07: qty 2

## 2022-05-07 MED ORDER — MILRINONE LACTATE IN DEXTROSE 20-5 MG/100ML-% IV SOLN
0.2500 ug/kg/min | INTRAVENOUS | Status: DC
  Administered 2022-05-07: 0.25 ug/kg/min via INTRAVENOUS
  Filled 2022-05-07: qty 100

## 2022-05-07 MED ORDER — ACETAMINOPHEN 500 MG PO TABS: 500.0000 mg | ORAL_TABLET | Freq: Four times a day (QID) | ORAL | Status: DC | PRN

## 2022-05-07 MED ORDER — LIDOCAINE HCL (PF) 1 % IJ SOLN
INTRAMUSCULAR | Status: DC | PRN
  Administered 2022-05-07: 2 mL

## 2022-05-07 MED ORDER — SODIUM CHLORIDE 0.9 % IV SOLN: INTRAVENOUS | Status: DC

## 2022-05-07 MED ORDER — LABETALOL HCL 5 MG/ML IV SOLN: 10.0000 mg | INTRAVENOUS | Status: DC | PRN

## 2022-05-07 MED ORDER — DIGOXIN 125 MCG PO TABS
0.0625 mg | ORAL_TABLET | Freq: Every day | ORAL | Status: DC
  Administered 2022-05-07: 0.0625 mg via ORAL
  Filled 2022-05-07: qty 1

## 2022-05-07 MED ORDER — CHLORHEXIDINE GLUCONATE CLOTH 2 % EX PADS
6.0000 | MEDICATED_PAD | Freq: Every day | CUTANEOUS | Status: DC
  Administered 2022-05-07: 6 via TOPICAL

## 2022-05-07 MED ORDER — FUROSEMIDE 20 MG PO TABS: 40.0000 mg | ORAL_TABLET | Freq: Every day | ORAL | Status: AC

## 2022-05-07 MED ORDER — HYDRALAZINE HCL 20 MG/ML IJ SOLN: 10.0000 mg | INTRAMUSCULAR | Status: DC | PRN

## 2022-05-07 MED ORDER — NITROGLYCERIN 2 % TD OINT: 0.5000 [in_us] | TOPICAL_OINTMENT | Freq: Every day | TRANSDERMAL | Status: DC | PRN

## 2022-05-07 MED ORDER — LEVOTHYROXINE SODIUM 75 MCG PO TABS
150.0000 ug | ORAL_TABLET | Freq: Every day | ORAL | Status: DC
  Administered 2022-05-07: 150 ug via ORAL
  Filled 2022-05-07: qty 2

## 2022-05-07 MED ORDER — HEPARIN (PORCINE) IN NACL 1000-0.9 UT/500ML-% IV SOLN
INTRAVENOUS | Status: DC | PRN
  Administered 2022-05-07: 500 mL

## 2022-05-07 MED ORDER — SODIUM CHLORIDE 0.9% FLUSH: 10.0000 mL | Freq: Two times a day (BID) | INTRAVENOUS | Status: DC

## 2022-05-07 MED ORDER — MACITENTAN 10 MG PO TABS: 10.0000 mg | ORAL_TABLET | Freq: Every day | ORAL | Status: DC

## 2022-05-07 MED ORDER — HYDROXYZINE HCL 10 MG PO TABS: 10.0000 mg | ORAL_TABLET | Freq: Three times a day (TID) | ORAL | Status: DC | PRN

## 2022-05-07 MED ORDER — ONDANSETRON HCL 4 MG/2ML IJ SOLN: 4.0000 mg | Freq: Four times a day (QID) | INTRAMUSCULAR | Status: DC | PRN

## 2022-05-07 MED ORDER — BELIMUMAB 200 MG/ML ~~LOC~~ SOAJ: 200.0000 mg | SUBCUTANEOUS | Status: DC

## 2022-05-07 SURGICAL SUPPLY — 6 items
CATH BALLN WEDGE 5F 110CM (CATHETERS) IMPLANT
PACK CARDIAC CATHETERIZATION (CUSTOM PROCEDURE TRAY) ×1 IMPLANT
PROTECTION STATION PRESSURIZED (MISCELLANEOUS) ×1
SHEATH GLIDE SLENDER 4/5FR (SHEATH) IMPLANT
STATION PROTECTION PRESSURIZED (MISCELLANEOUS) IMPLANT
TRANSDUCER W/STOPCOCK (MISCELLANEOUS) ×1 IMPLANT

## 2022-05-07 NOTE — Interval H&P Note (Signed)
History and Physical Interval Note:  05/07/2022 2:26 PM  [place]  has presented today for surgery, with the diagnosis of pulmonary htn.  The various methods of treatment have been discussed with the patient and family. After consideration of risks, benefits and other options for treatment, the patient has consented to  Procedure(s): RIGHT HEART CATH (N/A) as a surgical intervention.  The patient's history has been reviewed, patient examined, no change in status, stable for surgery.  I have reviewed the patient's chart and labs.  Questions were answered to the patient's satisfaction.     Riki Gehring [employer]

## 2022-05-07 NOTE — Progress Notes (Signed)
Peripherally Inserted Central Catheter Placement  The IV Nurse has discussed with the patient and/or persons authorized to consent for the patient, the purpose of this procedure and the potential benefits and risks involved with this procedure.  The benefits include less needle sticks, lab draws from the catheter, and the patient may be discharged home with the catheter. Risks include, but not limited to, infection, bleeding, blood clot (thrombus formation), and puncture of an artery; nerve damage and irregular heartbeat and possibility to perform a PICC exchange if needed/ordered by physician.  Alternatives to this procedure were also discussed.  B[employer] Power PICC patient education guide, fact sheet on infection prevention and patient information card has been provided to patient /or left at bedside.  PICC placed by Quin Hoop, RN   PICC Placement Documentation  PICC Double Lumen [id number] Left Basilic 39 cm 3 cm (Active)  Indication for Insertion or Continuance of Line Vasoactive infusions 05/07/22 1713  Exposed Catheter (cm) 3 cm 05/07/22 1713  Site Assessment Clean, Dry, Intact 05/07/22 1713  Lumen #1 Status Flushed;Saline locked;Blood return noted 05/07/22 1713  Lumen #2 Status Flushed;Blood return noted;Saline locked 05/07/22 1713  Dressing Type Transparent;Securing device 05/07/22 1713  Dressing Status Antimicrobial disc in place;Clean, Dry, Intact 05/07/22 1713  Safety Lock Not Applicable [id number]  Line Care Connections checked and tightened 05/07/22 1713  Line Adjustment (NICU/IV Team Only) No 05/07/22 1713  Dressing Intervention New dressing 05/07/22 1713  Dressing Change Due 05/14/22 05/07/22 1713       Jessica Frazier, Nicolette Bang 05/07/2022, 5:14 PM

## 2022-05-07 NOTE — Progress Notes (Signed)
Pulmonary hypertension, likely group 1 PH related to her SLE.  Failed trial of triple selective pulmonary vasodilator therapy.  Cardiac output is low. Being transferred to [facility] for evaluation to get parenteral treatment for pulmonary hypertension.

## 2022-05-07 NOTE — Progress Notes (Signed)
Patient is stable. Emtala completed by Rudi Rummage MD. Patient released to [place] team.

## 2022-05-07 NOTE — Progress Notes (Signed)
[employer] [phone]) regarding Transfer of Patient.   Patient Information and indication for transfer submitted.  Facesheet Faxed to [phone]    Transferring MD: Dr. Loralie Champagne  Receiving MD: Dr. Melvyn Novas   Lyda Jester, PA-C

## 2022-05-07 NOTE — Discharge Summary (Cosign Needed Addendum)
Advanced Heart Failure Team  Discharge Summary   Patient ID: Jessica Frazier MRN: [id number], DOB/AGE: [birth year] 23 y.o. Admit date: 05/07/2022 D/C date:     05/07/2022   Primary Discharge Diagnoses:  Severe Pulmonary Arterial Hypertension  RV Failure w/ Low-output   Secondary Discharge Diagnoses:  SLE   Hospital Course:   22 y.o. with history of SLE and seronegative rheumatoid arthritis was found to have pulmonary hypertension.  SLE is followed by Dr. Lenna Gilford.  She is on belimumab.  Earlier last year, she developed exertional dyspnea.  Her primary care physician ordered an echo, which showed mildly dilated RV with mild systolic dysfunction and PASP 86.  She was referred to pulmonary for evaluation.  PFTs showed moderate restriction with decreased DLCO. V/Q scan showed no evidence for chronic PE.  RHC in 7/23 showed severe pulmonary arterial hypertension.    Echo in 10/23 showed EF 55-60%, D-shaped septum, moderate RV enlargement with moderate RV dysfunction, PASP 124 mmHg.    Treatment of pulmonary hypertension was delayed somewhat by patient finding herself to be pregnant, but she had a miscarriage.  She has now had Nexplanon for contraception.    She was admitted in 23/23 with CHF/RV failure and was started on milrinone/diuresed. RHC was done, showing severe pulmonary arterial hypertension with normal filling pressures after diuresis and preserved cardiac output.    Follow up 1/24, had a recent lupus flare. Also had recent GI illness and ? Side effect of recent up titration of selexipag and advised to wait a week before uptitrating again.    Return clinic f/u 2/16, patient endorsed severe nausea and vomiting w/ 1000 mcg dose of selexipag and weight dropped to 95 lbs. Reported she had reduced back to 800 mcg dose w/ better tolerance. She was noted to have decline in performance of 6MW test w/ hypoxia. Subsequently referred for repeat RHC.   Presented for outpatient [place] 2/23  which demonstrated severe pulmonary arterial hypertension, elevated RA pressures w/ normal PCWP, low cardiac index of 1.9 and no evidence for left ->shunt. Directed admitted from cath lab and started on milrinone 0.25 mcg/kg/min. PICC placed. Home PH meds and PO diuretic continued. [facility] contacted for transfer for initiation of IV therapies. Dr. Aundra Dubin discussed w/ Dr. Melvyn Novas who accepted transfer.  Patient transferred to [facility] on Milrinone 0.25 mcg/kg/min.    RHC 05/07/22 Right Heart Pressures RHC Procedural Findings: Hemodynamics (mmHg) RA mean 13 RV 94/14 PA 99/35, mean 55 PCWP mean 4  Oxygen saturations: SVC 40% PA 41% AO 89%  Cardiac Output (Fick) 2.75  Cardiac Index (Fick) 1.9 PVR 18.5 WU   PAPi 4.9    Discharge Weight Range: 100.6 lb Discharge Vitals: Blood pressure 102/80, pulse (!) 105, temperature 97.8 F (36.6 C), temperature source Oral, resp. rate 16, height '5\' 2"'$  (1.575 m), weight 45.6 kg, last menstrual period 02/17/2022, SpO2 99 %.  PHYSICAL EXAM: General:  fatigued appearing. No respiratory difficulty HEENT: normal Neck: supple. JVD 14 cm. Carotids 2+ bilat; no bruits. No lymphadenopathy or thyromegaly appreciated. Cor: PMI nondisplaced. Regular rhythm, tachy rate. No rubs, gallops or murmurs. Lungs: clear Abdomen: soft, nontender, nondistended. No hepatosplenomegaly. No bruits or masses. Good bowel sounds. Extremities: no cyanosis, clubbing, rash, edema Neuro: alert & oriented x 3, cranial nerves grossly intact. moves all 4 extremities w/o difficulty. Affect pleasant.   Labs: Lab Results  Component Value Date   WBC 4.9 05/04/2022   HGB 10.5 (L) 05/07/2022   HCT 31.0 (L) 05/07/2022  MCV 83.9 05/04/2022   PLT 399 05/04/2022    Recent Labs  Lab 05/04/22 1220 05/07/22 1437 05/07/22 1441  NA 130*   < > 133*  K 3.5   < > 4.0  CL 99  --   --   CO2 17*  --   --   BUN 8  --   --   CREATININE 0.69  --   --   CALCIUM 7.9*  --   --   GLUCOSE 99   --   --    < > = values in this interval not displayed.   Lab Results  Component Value Date   CHOL 132 08/08/2020   HDL 48 08/08/2020   LDLCALC 68 08/08/2020   TRIG 82 08/08/2020   BNP (last 3 results) Recent Labs    02/02/22 1000 03/16/22 1524 05/04/22 1220  BNP 198.8* 234.3* 709.6*    ProBNP (last 3 results) No results for input(s): "PROBNP" in the last 8760 hours.   Diagnostic Studies/Procedures   [place] EKG SITE RITE  Result Date: 05/07/2022 If [facility] image not attached, placement could not be confirmed due to current cardiac rhythm.  CARDIAC CATHETERIZATION  Result Date: 05/07/2022 1. Severe pulmonary arterial hypertension. 2. Elevated RA pressure with normal PCWP. 3. Low cardiac index, 1.9. 4. No evidence for left => shunt Will admit, initiate milrinone gtt.    Discharge Medications   Allergies as of 05/07/2022       Reactions   Hydroxychloroquine    dyspnea   Other Diarrhea, Other (See Comments)   Omega XL   Rinvoq [upadacitinib] Other (See Comments)   Enlarged the patient's liver- had to come to the ED, 2022        Medication List     STOP taking these medications    Nitro-Bid 2 % ointment Generic drug: nitroGLYCERIN       TAKE these medications    acetaminophen 500 MG tablet Commonly known as: TYLENOL Take 500 mg by mouth every 6 (six) hours as needed for moderate pain.   Belimumab 200 MG/ML Soaj Inject 200 mg into the skin every Wednesday.   digoxin 0.125 MG tablet Commonly known as: LANOXIN Take 0.5 tablets (0.0625 mg total) by mouth daily.   furosemide 20 MG tablet Commonly known as: LASIX Take 2 tablets (40 mg total) by mouth daily.   hydrOXYzine 10 MG tablet Commonly known as: ATARAX Take 1 tablet (10 mg total) by mouth 3 (three) times daily as needed for itching.   levothyroxine 150 MCG tablet Commonly known as: SYNTHROID Take 1 tablet (150 mcg total) by mouth daily before breakfast.   macitentan 10 MG tablet Commonly  known as: OPSUMIT Take 10 mg by mouth daily.   milrinone 20 MG/100 ML Soln infusion Commonly known as: PRIMACOR Inject 0.0117 mg/min into the vein continuous.   potassium chloride SA 20 MEQ tablet Commonly known as: KLOR-CON M Take 2 tablets (40 mEq total) by mouth daily.   tadalafil 20 MG tablet Commonly known as: CIALIS Take 2 tablets (40 mg total) by mouth daily.   Uptravi 800 MCG Tabs Generic drug: Selexipag Take 1 tablet (800 mcg total) by mouth 2 (two) times daily.        Disposition   The patient will be transferred to [facility]     Duration of Discharge Encounter: Greater than 35 minutes   Signed, Nelida Gores  05/07/2022, 4:59 PM

## 2022-05-07 NOTE — Progress Notes (Signed)
Monitoring by Pharmacy for Pulmonary Hypertension Treatment   Indication - Continuation of prior to admission medication   Patient is 22 y.o.  with history of PAH on chronic macitentan (OPSUMIT) PTA and will be continued while hospitalized.   Continuing this medication order as an inpatient requires that monitoring parameters per REMS requirements must be met.  Chronic therapy is under the supervision of Dr. Aundra Dubin who is enrolled in the REMS program and is being notified of continuation of therapy. A staff message in EPIC has been sent notifying the certified prescriber.  Per patient report has previously been educated on Pregnancy risk and Hepatotoxicity. On admission pregnancy risk has been assessed and pregnancy test dated 01/08/22 was negative, and birth control implant has been confirmed as birth control option for this patient.  Hepatic function has been evaluated. AST / ALT appropriate to continue medication at this time.     Latest Ref Rng & Units 01/12/2022    5:31 AM 01/10/2022    3:38 AM 01/08/2022    6:22 PM  Hepatic Function  Total Protein 6.5 - 8.1 g/dL 7.8  7.2  7.8   Albumin 3.5 - 5.0 g/dL 2.0  1.9  2.1   AST 15 - 41 U/L 54  53  67   ALT 0 - 44 U/L '28  26  29   '$ Alk Phosphatase 38 - 126 U/L 69  63  65   Total Bilirubin 0.3 - 1.2 mg/dL 0.6  0.6  0.8   Bilirubin, Direct 0.0 - 0.2 mg/dL <0.1   0.3     If any question arise or pregnancy is identified during hospitalization, contact for bosentan: [phone]; macitentan: [phone]; ambrisentan: [phone].  Thank for you allowing [place] to participate in the care of this patient.  Pat Patrick 05/07/2022, 4:00 PM Clinical Pharmacist  Guides for Female Patient:  ambrisentan (LETAIRIS), macitentan (OPSUMIT), bosentan (TRACLEER).
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
## 2022-05-10 ENCOUNTER — Encounter (HOSPITAL_COMMUNITY): Payer: Self-pay | Admitting: Cardiology

## 2022-05-14 ENCOUNTER — Encounter (HOSPITAL_BASED_OUTPATIENT_CLINIC_OR_DEPARTMENT_OTHER): Payer: BC Managed Care – PPO | Admitting: Internal Medicine

## 2022-05-14 ENCOUNTER — Other Ambulatory Visit (HOSPITAL_COMMUNITY): Payer: BC Managed Care – PPO

## 2022-05-21 ENCOUNTER — Telehealth: Payer: Self-pay | Admitting: *Deleted

## 2022-05-21 DIAGNOSIS — R400 Somnolence: Secondary | ICD-10-CM

## 2022-05-21 DIAGNOSIS — I272 Pulmonary hypertension, unspecified: Secondary | ICD-10-CM

## 2022-05-21 DIAGNOSIS — G4733 Obstructive sleep apnea (adult) (pediatric): Secondary | ICD-10-CM

## 2022-05-21 NOTE — Telephone Encounter (Signed)
-----   Message from Lauralee Evener, [place] sent at 03/30/2022  8:24 AM EST -----  ----- Message ----- From: Sueanne Margarita, MD Sent: 03/26/2022  10:14 AM EST To: Cv Div Sleep Studies  Please let patient know that they have sleep apnea.  Recommend therapeutic CPAP titration for treatment of patient's sleep disordered breathing.  If unable to perform an in lab titration then initiate ResMed auto CPAP from 4 to 15cm H2O with heated humidity and mask of choice and overnight pulse ox on CPAP.

## 2022-05-21 NOTE — Telephone Encounter (Signed)
The patient has been notified of the result and verbalized understanding.  All questions (if any) were answered. Marolyn Hammock, CMA 05/21/2022 [id number] AM    Will precert titration

## 2022-05-30 ENCOUNTER — Other Ambulatory Visit (HOSPITAL_COMMUNITY): Payer: Self-pay | Admitting: Cardiology

## 2022-06-02 NOTE — Addendum Note (Signed)
Addended by: Freada Bergeron on: 06/02/2022 06:42 PM   Modules accepted: Orders

## 2022-06-08 ENCOUNTER — Ambulatory Visit: Payer: BC Managed Care – PPO | Admitting: Internal Medicine

## 2022-06-10 NOTE — Addendum Note (Signed)
Addended by: Freada Bergeron on: 06/10/2022 12:08 PM   Modules accepted: Orders

## 2022-06-10 NOTE — Telephone Encounter (Addendum)
Prior Authorization for TITRATION sent to [facility] via web portal. Tracking Number .  3/28  DENIED-Order ID: [id number]  3/25  Order ID: [id number] Determination Date:06/09/2022-CARELON  If unable to perform an in lab titration then initiate ResMed auto CPAP from 4 to 15cm H2O with heated humidity and mask of choice and overnight pulse ox on CPAP.   PATIENT NOTIFIED WITH SPANISH INTERPRETER   DME selection is [facility]. Patient understands he will be contacted by [facility] to set up his cpap. Patient understands to call if [facility] does not contact him with new setup in a timely manner. Patient understands they will be called once confirmation has been received from [facility] that they have received their new machine to schedule 10 week follow up appointment.   [facility] notified of new cpap order  Please add to airview Patient was grateful for the call and thanked me.

## 2022-06-14 ENCOUNTER — Telehealth (HOSPITAL_COMMUNITY): Payer: Self-pay | Admitting: *Deleted

## 2022-06-14 ENCOUNTER — Telehealth (HOSPITAL_COMMUNITY): Payer: Self-pay

## 2022-06-14 NOTE — Telephone Encounter (Signed)
Pt returned phone call and stated that she would prefer to come to [facility] for her pulmonary rehab. Per nurse navigator's note pt has to complete 4/24 follow up. Placed pt ppw in the 4/24 f/u folder.

## 2022-06-14 NOTE — Telephone Encounter (Signed)
Received referral notification from Dr. Hortencia Pilar at [facility] for this pt to participate in pulmonary rehab with the diagnosis of pulmonary hypertension. Noted that pt resides in [place]. Called and left message for pt to determine preference of location. Has post hospitalization follow up on 4/24.  Will follow along while waiting for return call. Cherre Huger, BSN Cardiac and Training and development officer

## 2022-07-05 ENCOUNTER — Telehealth (HOSPITAL_COMMUNITY): Payer: Self-pay

## 2022-07-05 NOTE — Telephone Encounter (Signed)
Pt called Pulmonary Rehab about getting into the program. Message taken and RN returned call. LVM for the patient. Pt will need to be cleared by her MD at her 07/07/22 appointment.

## 2022-07-09 ENCOUNTER — Other Ambulatory Visit (HOSPITAL_COMMUNITY): Payer: Self-pay

## 2022-07-12 ENCOUNTER — Other Ambulatory Visit (HOSPITAL_COMMUNITY): Payer: Self-pay

## 2022-08-01 ENCOUNTER — Emergency Department (HOSPITAL_COMMUNITY): Payer: BC Managed Care – PPO

## 2022-08-01 ENCOUNTER — Emergency Department (HOSPITAL_COMMUNITY)
Admission: EM | Admit: 2022-08-01 | Discharge: 2022-08-01 | Discharge status: Against Medical Advice | Payer: BC Managed Care – PPO | Attending: Emergency Medicine | Admitting: Emergency Medicine

## 2022-08-01 DIAGNOSIS — R1084 Generalized abdominal pain: Secondary | ICD-10-CM | POA: Insufficient documentation

## 2022-08-01 DIAGNOSIS — R079 Chest pain, unspecified: Secondary | ICD-10-CM | POA: Insufficient documentation

## 2022-08-01 DIAGNOSIS — R0682 Tachypnea, not elsewhere classified: Secondary | ICD-10-CM | POA: Insufficient documentation

## 2022-08-01 DIAGNOSIS — R140 Abdominal distension (gaseous): Secondary | ICD-10-CM | POA: Insufficient documentation

## 2022-08-01 DIAGNOSIS — D61818 Other pancytopenia: Secondary | ICD-10-CM | POA: Insufficient documentation

## 2022-08-01 DIAGNOSIS — R7989 Other specified abnormal findings of blood chemistry: Secondary | ICD-10-CM | POA: Insufficient documentation

## 2022-08-01 LAB — BASIC METABOLIC PANEL
Anion gap: 8 (ref 5–15)
BUN: 7 mg/dL (ref 6–20)
CO2: 23 mmol/L (ref 22–32)
Calcium: 8.5 mg/dL — ABNORMAL LOW (ref 8.9–10.3)
Chloride: 102 mmol/L (ref 98–111)
Creatinine, Ser: 0.58 mg/dL (ref 0.44–1.00)
GFR, Estimated: >60 mL/min (ref >60)
Glucose, Bld: 94 mg/dL (ref 70–99)
Potassium: 3.7 mmol/L (ref 3.5–5.1)
Sodium: 133 mmol/L — ABNORMAL LOW (ref 135–145)

## 2022-08-01 LAB — CBC
HCT: 30.5 % — ABNORMAL LOW (ref 36.0–46.0)
Hemoglobin: 9.3 g/dL — ABNORMAL LOW (ref 12.0–15.0)
MCH: 30.4 pg (ref 26.0–34.0)
MCHC: 30.5 g/dL (ref 30.0–36.0)
MCV: 99.7 fL (ref 80.0–100.0)
Platelets: 147 10*3/uL — ABNORMAL LOW (ref 150–400)
RBC: 3.06 MIL/uL — ABNORMAL LOW (ref 3.87–5.11)
RDW: 15.3 % (ref 11.5–15.5)
WBC: 1.4 10*3/uL — CL (ref 4.0–10.5)
nRBC: 0 % (ref 0.0–0.2)

## 2022-08-01 LAB — URINALYSIS, ROUTINE W REFLEX MICROSCOPIC
Bacteria, UA: NONE SEEN
Bilirubin Urine: NEGATIVE
Glucose, UA: NEGATIVE mg/dL
Hgb urine dipstick: NEGATIVE
Ketones, ur: NEGATIVE mg/dL
Leukocytes,Ua: NEGATIVE
Nitrite: NEGATIVE
Protein, ur: NEGATIVE mg/dL
Specific Gravity, Urine: 1.014 (ref 1.005–1.030)
pH: 5 (ref 5.0–8.0)

## 2022-08-01 LAB — BRAIN NATRIURETIC PEPTIDE: B Natriuretic Peptide: 361 pg/mL — ABNORMAL HIGH (ref 0.0–100.0)

## 2022-08-01 LAB — I-STAT BETA HCG BLOOD, ED (MC, WL, AP ONLY): I-stat hCG, quantitative: <5 m[IU]/mL (ref <5)

## 2022-08-01 LAB — PREGNANCY, URINE: Preg Test, Ur: NEGATIVE

## 2022-08-01 LAB — TROPONIN I (HIGH SENSITIVITY)
Troponin I (High Sensitivity): 24 ng/L — ABNORMAL HIGH (ref <18)
Troponin I (High Sensitivity): 25 ng/L — ABNORMAL HIGH (ref <18)

## 2022-08-01 NOTE — ED Triage Notes (Signed)
Pt arrives with c/o CP that started about 3 hours ago. Pt wears home O2 2-3L. Pt  has PICC line with with home infusion meds. Pt denies SOB.

## 2022-08-01 NOTE — ED Provider Notes (Signed)
[place] [facility] DE[facility]MENT AT [facility] Provider Note   CSN: [id number] Arrival date & time: 08/01/22  0025     History  Chief Complaint  Patient presents with   Chest Pain    [facility] Jessica Frazier is a 22 y.o. female.  Patient presents to the emergency department for evaluation of chest pain.  Patient has a very complicated past medical history.  Patient has a history of lupus, on belimumab therapy.  Patient has severe pulmonary artery hypertension, on chronic milrinone drip.  She was recently treated for MSSA bacteremia, just completed antibiotics.  Patient reports that she is experiencing chest pain that has been present all day, unchanged.  She also has been experiencing abdominal bloating and discomfort.       Home Medications Prior to Admission medications   Medication Sig Start Date End Date Taking? Authorizing Provider  acetaminophen (TYLENOL) 500 MG tablet Take 500 mg by mouth every 6 (six) hours as needed for moderate pain.    [provider]  Belimumab 200 MG/ML SOAJ Inject 200 mg into the skin every Wednesday.    [provider]  digoxin (LANOXIN) 0.125 MG tablet Take 0.5 tablets (0.0625 mg total) by mouth daily. 04/30/22   Milford, Anderson Malta, FNP  furosemide (LASIX) 20 MG tablet Take 2 tablets (40 mg total) by mouth daily. 05/07/22   Robbie Lis M, PA-C  hydrOXYzine (ATARAX) 10 MG tablet TAKE 1 TABLET BY MOUTH 3 TIMES DAILY AS NEEDED FOR ITCHING. 05/31/22   Laurey Morale, MD  levothyroxine (SYNTHROID) 150 MCG tablet Take 1 tablet (150 mcg total) by mouth daily before breakfast. 02/08/22   Shamleffer, Konrad Dolores, MD  macitentan (OPSUMIT) 10 MG tablet Take 10 mg by mouth daily.    [provider]  milrinone (PRIMACOR) 20 MG/100 ML SOLN infusion Inject 0.0117 mg/min into the vein continuous. 05/07/22   Robbie Lis M, PA-C  potassium chloride SA (KLOR-CON M) 20 MEQ tablet Take 2 tablets (40 mEq total)  by mouth daily. 05/05/22   Milford, Anderson Malta, FNP  Selexipag (UPTRAVI) 800 MCG TABS Take 1 tablet (800 mcg total) by mouth 2 (two) times daily. 03/23/22   Laurey Morale, MD  tadalafil (CIALIS) 20 MG tablet Take 2 tablets (40 mg total) by mouth daily. 03/31/22   Laurey Morale, MD      Allergies    Hydroxychloroquine, Other, and Rinvoq [upadacitinib]    Review of Systems   Review of Systems  Physical Exam Updated Vital Signs BP (!) 131/93 (BP Location: Right Arm)   Pulse (!) 117   Temp 98.6 F (37 C)   Resp (!) 44   Wt 45.4 kg   SpO2 91%   BMI 18.29 kg/m  Physical Exam Constitutional:      Appearance: She is underweight. She is ill-appearing.  Pulmonary:     Effort: Tachypnea present.     Breath sounds: Normal breath sounds and air entry.  Abdominal:     General: Bowel sounds are normal. There is distension.     Tenderness: There is generalized abdominal tenderness.     ED Results / Procedures / Treatments   Labs (all labs ordered are listed, but only abnormal results are displayed) Labs Reviewed  BASIC METABOLIC PANEL - Abnormal; Notable for the following components:      Result Value   Sodium 133 (*)    Calcium 8.5 (*)    All other components within normal limits  CBC - Abnormal;  Notable for the following components:   WBC 1.4 (*)    RBC 3.06 (*)    Hemoglobin 9.3 (*)    HCT 30.5 (*)    Platelets 147 (*)    All other components within normal limits  BRAIN NATRIURETIC PEPTIDE - Abnormal; Notable for the following components:   B Natriuretic Peptide 361.0 (*)    All other components within normal limits  URINALYSIS, ROUTINE W REFLEX MICROSCOPIC - Abnormal; Notable for the following components:   APPearance HAZY (*)    All other components within normal limits  TROPONIN I (HIGH SENSITIVITY) - Abnormal; Notable for the following components:   Troponin I (High Sensitivity) 24 (*)    All other components within normal limits  TROPONIN I (HIGH SENSITIVITY) -  Abnormal; Notable for the following components:   Troponin I (High Sensitivity) 25 (*)    All other components within normal limits  PREGNANCY, URINE  I-STAT BETA HCG BLOOD, ED ([facility], [facility], [facility] ONLY)    EKG None  Radiology DG Chest 2 View  Result Date: 08/01/2022 CLINICAL DATA:  Chest pain EXAM: CHEST - 2 VIEW COMPARISON:  01/08/2022 FINDINGS: Lung volumes are small. Patchy bibasilar pulmonary infiltrates are present, possibly infectious or inflammatory in the acute setting. Small right pleural effusion noted. No pneumothorax. Stable mild cardiomegaly. Left upper extremity PICC line tip seen at the superior cavoatrial junction. No acute bone abnormality. IMPRESSION: 1. Pulmonary hypoinflation. 2. Patchy bibasilar pulmonary infiltrates, possibly infectious or inflammatory in the acute setting. 3. Small right pleural effusion. Electronically Signed   By: Helyn Numbers M.D.   On: 08/01/2022 01:18    Procedures Procedures    Medications Ordered in ED Medications - No data to display  ED Course/ Medical Decision Making/ A&P                             Medical Decision Making Amount and/or Complexity of Data Reviewed External Data Reviewed: labs, radiology, ECG and notes. Labs: ordered. Decision-making details documented in ED Course. Radiology: ordered and independent interpretation performed. Decision-making details documented in ED Course. ECG/medicine tests: ordered and independent interpretation performed. Decision-making details documented in ED Course.   Differential Diagnosis considered includes, but not limited to: STEMI; NSTEMI; myocarditis; pericarditis; pulmonary embolism; aortic dissection; pneumothorax; pneumonia; gastritis; musculoskeletal pain; bowel obstruction; abdominal infection  Patient presents with several complaints.  Patient complaining of chest pain.  She does have a history of pulmonary artery hypertension but does not normally have pain like this associated with  her chronic disease.  Record review reveals a history of lupus on chronic treatment.  She is not anticoagulated.  Patient noted to be tachycardic.  She is chronically on oxygen so hypoxia is difficult to ascertain.  PE is a very strong possibility.  Basic lab work reveals elevated BNP, elevated troponin.  These are likely chronic elevations due to her chronic disease and pulmonary artery hypertension.  EKG does not show obvious ischemia and she does not have a reason for CAD.  Nursing staff had difficulty obtaining IV access that would be adequate for CT angiography.  I did attempt x 2.  I could not advance Angiocath in the right antecubital fossa.  She does have a good vein in the right upper arm, however, positioning was very difficult and I was unable to thread this Angiocath as well.  At this point, patient refused any further attempts.  Additional blood work reveals pancytopenia.  This has not been seen before in this patient.  This is concerning in a patient who was just treated for bacteremia.  Unclear if this is secondary to acute infection, or her immunotherapy for lupus.  Looking back, she does get weekly labs and white count and platelets have not been low like this in the past.  Patient was informed that she would need to be transferred to [facility] but that I would like to rule out life-threatening PE first.  Patient did become frustrated and refused further IV attempts.  She wanted to go directly to [facility].  I informed her that I could not recommend that she go to [facility] without me contacting them and arranging for formal transfer.  Unfortunately, however, patient refused any further workup, did not want me to initiate transfer and left the emergency department AGAINST MEDICAL ADVICE.        Final Clinical Impression(s) / ED Diagnoses Final diagnoses:  Chest pain, unspecified type  Pancytopenia [facility])    Rx / DC Orders ED Discharge Orders     None         Cordae Mccarey, Canary Brim,  MD 08/01/22 [phone]

## 2022-08-01 NOTE — ED Notes (Signed)
Pt and family came out room and advised that they wish to leave and go to [facility] hospital since there is "nothing we can do for her here" MD notified and will attempt to set up transfer

## 2022-08-01 NOTE — ED Notes (Signed)
Pt states that she does not want to be transferred and just wants to leave and drive herself to [facility], advised pt that is not in her best interest and explained risks of signing out AMA, MD notified of pt wanting to leave AMA.

## 2022-08-01 NOTE — ED Notes (Signed)
MD attempting ultrasound IV stick
